# Patient Record
Sex: Male | Born: 1935 | Race: Black or African American | Hispanic: No | Marital: Married | Smoking: Former smoker
Health system: Southern US, Community
[De-identification: ages and names within clinical notes are randomized; demographics above are authoritative.]

## PROBLEM LIST (undated history)

## (undated) DIAGNOSIS — Z992 Dependence on renal dialysis: Secondary | ICD-10-CM

## (undated) DIAGNOSIS — I639 Cerebral infarction, unspecified: Secondary | ICD-10-CM

## (undated) DIAGNOSIS — N186 End stage renal disease: Secondary | ICD-10-CM

## (undated) DIAGNOSIS — A419 Sepsis, unspecified organism: Secondary | ICD-10-CM

## (undated) DIAGNOSIS — N390 Urinary tract infection, site not specified: Secondary | ICD-10-CM

## (undated) DIAGNOSIS — I10 Essential (primary) hypertension: Secondary | ICD-10-CM

## (undated) DIAGNOSIS — R55 Syncope and collapse: Secondary | ICD-10-CM

## (undated) DIAGNOSIS — M5410 Radiculopathy, site unspecified: Secondary | ICD-10-CM

## (undated) DIAGNOSIS — F0390 Unspecified dementia without behavioral disturbance: Secondary | ICD-10-CM

## (undated) DIAGNOSIS — M1990 Unspecified osteoarthritis, unspecified site: Secondary | ICD-10-CM

## (undated) DIAGNOSIS — R972 Elevated prostate specific antigen [PSA]: Secondary | ICD-10-CM

## (undated) HISTORY — PX: EYE SURGERY: SHX253

## (undated) HISTORY — DX: Dependence on renal dialysis: Z99.2

## (undated) HISTORY — DX: Radiculopathy, site unspecified: M54.10

## (undated) HISTORY — DX: Essential (primary) hypertension: I10

## (undated) HISTORY — DX: End stage renal disease: N18.6

## (undated) HISTORY — DX: Cerebral infarction, unspecified: I63.9

## (undated) HISTORY — DX: Unspecified osteoarthritis, unspecified site: M19.90

## (undated) HISTORY — DX: Elevated prostate specific antigen (PSA): R97.20

---

## 1976-08-11 HISTORY — PX: VARICOSE VEIN SURGERY: SHX832

## 1988-08-11 HISTORY — PX: EXTERNAL FIXATION WRIST FRACTURE: SHX1553

## 1996-04-21 ENCOUNTER — Encounter: Payer: Self-pay | Admitting: Family Medicine

## 1996-04-21 LAB — CONVERTED CEMR LAB: PSA: 1.5 ng/mL

## 1997-04-24 ENCOUNTER — Encounter: Payer: Self-pay | Admitting: Family Medicine

## 1997-07-17 ENCOUNTER — Encounter: Payer: Self-pay | Admitting: Family Medicine

## 1997-07-17 LAB — CONVERTED CEMR LAB: PSA: 1.6 ng/mL

## 1997-07-18 ENCOUNTER — Encounter: Payer: Self-pay | Admitting: Family Medicine

## 1997-07-18 LAB — CONVERTED CEMR LAB: Microalbumin U total vol: 335.7 mg/L

## 1998-05-16 ENCOUNTER — Encounter: Payer: Self-pay | Admitting: Family Medicine

## 1998-05-16 LAB — CONVERTED CEMR LAB: Hgb A1c MFr Bld: 7.4 %

## 1998-10-31 ENCOUNTER — Encounter: Payer: Self-pay | Admitting: Family Medicine

## 1998-10-31 LAB — CONVERTED CEMR LAB: Hgb A1c MFr Bld: 5.5 %

## 1999-09-19 ENCOUNTER — Encounter: Payer: Self-pay | Admitting: Family Medicine

## 1999-09-19 LAB — CONVERTED CEMR LAB: Microalbumin U total vol: 1247.6 mg/L

## 1999-09-20 ENCOUNTER — Encounter: Payer: Self-pay | Admitting: Family Medicine

## 1999-09-20 LAB — CONVERTED CEMR LAB: PSA: 2 ng/mL

## 2000-04-16 ENCOUNTER — Encounter: Payer: Self-pay | Admitting: Family Medicine

## 2000-10-23 ENCOUNTER — Encounter: Payer: Self-pay | Admitting: Family Medicine

## 2000-10-26 ENCOUNTER — Encounter: Payer: Self-pay | Admitting: Family Medicine

## 2000-10-26 LAB — CONVERTED CEMR LAB: Microalbumin U total vol: 432.8 mg/L

## 2001-01-11 ENCOUNTER — Encounter: Payer: Self-pay | Admitting: Family Medicine

## 2001-01-11 LAB — CONVERTED CEMR LAB: PSA: 3 ng/mL

## 2001-01-12 ENCOUNTER — Encounter: Payer: Self-pay | Admitting: Family Medicine

## 2001-01-12 LAB — CONVERTED CEMR LAB: Microalbumin U total vol: 136.4 mg/L

## 2001-09-23 ENCOUNTER — Encounter: Payer: Self-pay | Admitting: Family Medicine

## 2002-03-15 ENCOUNTER — Encounter: Payer: Self-pay | Admitting: Family Medicine

## 2002-03-15 LAB — CONVERTED CEMR LAB: PSA: 2.8 ng/mL

## 2002-09-21 ENCOUNTER — Encounter: Payer: Self-pay | Admitting: Family Medicine

## 2002-09-21 LAB — CONVERTED CEMR LAB
Hgb A1c MFr Bld: 5.7 %
Microalbumin U total vol: 112.1 mg/L

## 2003-03-13 ENCOUNTER — Encounter: Payer: Self-pay | Admitting: Family Medicine

## 2003-04-24 ENCOUNTER — Encounter: Payer: Self-pay | Admitting: Surgery

## 2003-04-24 ENCOUNTER — Ambulatory Visit (HOSPITAL_COMMUNITY): Admission: RE | Admit: 2003-04-24 | Discharge: 2003-04-24 | Payer: Self-pay | Admitting: Surgery

## 2003-05-31 ENCOUNTER — Ambulatory Visit (HOSPITAL_COMMUNITY): Admission: RE | Admit: 2003-05-31 | Discharge: 2003-05-31 | Payer: Self-pay | Admitting: Surgery

## 2003-05-31 ENCOUNTER — Encounter: Payer: Self-pay | Admitting: Surgery

## 2003-08-03 ENCOUNTER — Encounter (INDEPENDENT_AMBULATORY_CARE_PROVIDER_SITE_OTHER): Payer: Self-pay | Admitting: *Deleted

## 2003-08-03 ENCOUNTER — Inpatient Hospital Stay (HOSPITAL_COMMUNITY): Admission: RE | Admit: 2003-08-03 | Discharge: 2003-08-04 | Payer: Self-pay | Admitting: Surgery

## 2003-08-03 HISTORY — PX: THYROIDECTOMY, PARTIAL: SHX18

## 2004-07-11 HISTORY — PX: AV FISTULA PLACEMENT: SHX1204

## 2004-07-26 ENCOUNTER — Ambulatory Visit (HOSPITAL_COMMUNITY): Admission: RE | Admit: 2004-07-26 | Discharge: 2004-07-26 | Payer: Self-pay | Admitting: Vascular Surgery

## 2004-07-31 ENCOUNTER — Ambulatory Visit: Payer: Self-pay | Admitting: Family Medicine

## 2005-04-08 ENCOUNTER — Ambulatory Visit: Payer: Self-pay | Admitting: Family Medicine

## 2005-04-08 LAB — CONVERTED CEMR LAB: Hgb A1c MFr Bld: 6.1 %

## 2005-04-22 ENCOUNTER — Ambulatory Visit: Payer: Self-pay | Admitting: Family Medicine

## 2005-07-30 ENCOUNTER — Ambulatory Visit: Payer: Self-pay | Admitting: Family Medicine

## 2005-10-16 ENCOUNTER — Ambulatory Visit: Payer: Self-pay | Admitting: Family Medicine

## 2005-10-16 LAB — CONVERTED CEMR LAB: Microalbumin U total vol: 599.4 mg/L

## 2005-10-22 ENCOUNTER — Ambulatory Visit: Payer: Self-pay | Admitting: Family Medicine

## 2005-11-12 ENCOUNTER — Ambulatory Visit: Payer: Self-pay | Admitting: Family Medicine

## 2006-02-26 ENCOUNTER — Ambulatory Visit: Payer: Self-pay | Admitting: Family Medicine

## 2006-04-20 ENCOUNTER — Ambulatory Visit: Payer: Self-pay | Admitting: Family Medicine

## 2006-04-27 ENCOUNTER — Ambulatory Visit: Payer: Self-pay | Admitting: Family Medicine

## 2006-04-29 ENCOUNTER — Ambulatory Visit: Payer: Self-pay | Admitting: Family Medicine

## 2006-05-14 DIAGNOSIS — M5410 Radiculopathy, site unspecified: Secondary | ICD-10-CM

## 2006-05-14 HISTORY — DX: Radiculopathy, site unspecified: M54.10

## 2006-05-25 ENCOUNTER — Encounter: Admission: RE | Admit: 2006-05-25 | Discharge: 2006-06-18 | Payer: Self-pay | Admitting: *Deleted

## 2006-05-26 ENCOUNTER — Encounter: Payer: Self-pay | Admitting: Cardiology

## 2006-05-26 ENCOUNTER — Ambulatory Visit: Payer: Self-pay

## 2006-10-19 ENCOUNTER — Ambulatory Visit: Payer: Self-pay | Admitting: Family Medicine

## 2006-10-19 LAB — CONVERTED CEMR LAB
ALT: 11 units/L (ref 0–40)
AST: 13 units/L (ref 0–37)
BUN: 77 mg/dL — ABNORMAL HIGH (ref 6–23)
Calcium: 10.4 mg/dL (ref 8.4–10.5)
Chloride: 109 meq/L (ref 96–112)
Cholesterol: 108 mg/dL (ref 0–200)
Creatinine, Ser: 6.7 mg/dL (ref 0.4–1.5)
Glucose, Bld: 102 mg/dL — ABNORMAL HIGH (ref 70–99)
Hgb A1c MFr Bld: 6.3 %
LDL Cholesterol: 61 mg/dL (ref 0–99)
Microalb Creat Ratio: 115.5 mg/g — ABNORMAL HIGH (ref 0.0–30.0)
Microalb, Ur: 17.1 mg/dL — ABNORMAL HIGH (ref 0.0–1.9)
Sodium: 144 meq/L (ref 135–145)
TSH: 0.95 microintl units/mL (ref 0.35–5.50)
Total Bilirubin: 0.7 mg/dL (ref 0.3–1.2)
Total CHOL/HDL Ratio: 3.4
Triglycerides: 78 mg/dL (ref 0–149)
VLDL: 16 mg/dL (ref 0–40)

## 2006-11-04 ENCOUNTER — Ambulatory Visit: Payer: Self-pay | Admitting: Family Medicine

## 2006-11-23 ENCOUNTER — Ambulatory Visit: Payer: Self-pay | Admitting: Family Medicine

## 2006-11-25 ENCOUNTER — Encounter: Payer: Self-pay | Admitting: Family Medicine

## 2006-11-25 DIAGNOSIS — I10 Essential (primary) hypertension: Secondary | ICD-10-CM | POA: Insufficient documentation

## 2006-11-25 DIAGNOSIS — E1149 Type 2 diabetes mellitus with other diabetic neurological complication: Secondary | ICD-10-CM | POA: Insufficient documentation

## 2006-12-14 ENCOUNTER — Ambulatory Visit: Payer: Self-pay | Admitting: Family Medicine

## 2006-12-14 DIAGNOSIS — R972 Elevated prostate specific antigen [PSA]: Secondary | ICD-10-CM | POA: Insufficient documentation

## 2006-12-14 LAB — CONVERTED CEMR LAB
PSA, Free Pct: 30 (ref >25)
PSA, Free: 1.6 ng/mL

## 2006-12-16 ENCOUNTER — Ambulatory Visit: Payer: Self-pay | Admitting: Family Medicine

## 2006-12-16 DIAGNOSIS — E1122 Type 2 diabetes mellitus with diabetic chronic kidney disease: Secondary | ICD-10-CM

## 2006-12-16 DIAGNOSIS — N186 End stage renal disease: Secondary | ICD-10-CM

## 2006-12-29 ENCOUNTER — Ambulatory Visit: Payer: Self-pay | Admitting: Family Medicine

## 2006-12-29 LAB — CONVERTED CEMR LAB
BUN: 103 mg/dL (ref 6–23)
Calcium: 10.1 mg/dL (ref 8.4–10.5)
GFR calc Af Amer: 9 mL/min
GFR calc non Af Amer: 7 mL/min
Glucose, Bld: 102 mg/dL — ABNORMAL HIGH (ref 70–99)
Potassium: 4.1 meq/L (ref 3.5–5.1)

## 2007-01-05 ENCOUNTER — Ambulatory Visit: Payer: Self-pay | Admitting: Family Medicine

## 2007-01-25 ENCOUNTER — Encounter: Payer: Self-pay | Admitting: Family Medicine

## 2007-02-10 ENCOUNTER — Encounter (INDEPENDENT_AMBULATORY_CARE_PROVIDER_SITE_OTHER): Payer: Self-pay | Admitting: *Deleted

## 2007-02-10 ENCOUNTER — Telehealth (INDEPENDENT_AMBULATORY_CARE_PROVIDER_SITE_OTHER): Payer: Self-pay | Admitting: *Deleted

## 2007-02-10 ENCOUNTER — Ambulatory Visit: Payer: Self-pay | Admitting: Family Medicine

## 2007-02-10 LAB — CONVERTED CEMR LAB
BUN: 92 mg/dL (ref 6–23)
CO2: 20 meq/L (ref 19–32)
Calcium: 9.8 mg/dL (ref 8.4–10.5)
Chloride: 114 meq/L — ABNORMAL HIGH (ref 96–112)
Creatinine, Ser: 6.6 mg/dL (ref 0.4–1.5)
GFR calc Af Amer: 11 mL/min
Glucose, Bld: 107 mg/dL — ABNORMAL HIGH (ref 70–99)
Potassium: 4.4 meq/L (ref 3.5–5.1)

## 2007-02-16 ENCOUNTER — Ambulatory Visit: Payer: Self-pay | Admitting: Family Medicine

## 2007-03-26 ENCOUNTER — Encounter (HOSPITAL_COMMUNITY): Admission: RE | Admit: 2007-03-26 | Discharge: 2007-06-24 | Payer: Self-pay | Admitting: Nephrology

## 2007-04-16 ENCOUNTER — Ambulatory Visit: Payer: Self-pay | Admitting: Family Medicine

## 2007-04-17 LAB — CONVERTED CEMR LAB
Creatinine, Ser: 7.2 mg/dL (ref 0.4–1.5)
GFR calc non Af Amer: 8 mL/min
Potassium: 5.3 meq/L — ABNORMAL HIGH (ref 3.5–5.1)

## 2007-04-21 ENCOUNTER — Ambulatory Visit: Payer: Self-pay | Admitting: Family Medicine

## 2007-05-27 ENCOUNTER — Telehealth (INDEPENDENT_AMBULATORY_CARE_PROVIDER_SITE_OTHER): Payer: Self-pay | Admitting: *Deleted

## 2007-05-31 ENCOUNTER — Telehealth (INDEPENDENT_AMBULATORY_CARE_PROVIDER_SITE_OTHER): Payer: Self-pay | Admitting: *Deleted

## 2007-06-28 ENCOUNTER — Ambulatory Visit: Payer: Self-pay | Admitting: Family Medicine

## 2007-07-01 ENCOUNTER — Encounter (HOSPITAL_COMMUNITY): Admission: RE | Admit: 2007-07-01 | Discharge: 2007-09-29 | Payer: Self-pay | Admitting: Nephrology

## 2007-07-15 ENCOUNTER — Ambulatory Visit: Payer: Self-pay | Admitting: Family Medicine

## 2007-07-20 ENCOUNTER — Ambulatory Visit: Payer: Self-pay | Admitting: Family Medicine

## 2007-08-12 DIAGNOSIS — Z992 Dependence on renal dialysis: Secondary | ICD-10-CM

## 2007-08-12 DIAGNOSIS — N186 End stage renal disease: Secondary | ICD-10-CM

## 2007-08-12 HISTORY — DX: End stage renal disease: N18.6

## 2007-08-12 HISTORY — DX: Dependence on renal dialysis: Z99.2

## 2007-08-18 ENCOUNTER — Telehealth: Payer: Self-pay | Admitting: Family Medicine

## 2007-08-23 ENCOUNTER — Ambulatory Visit: Payer: Self-pay | Admitting: Family Medicine

## 2007-08-23 LAB — CONVERTED CEMR LAB
AST: 13 units/L (ref 0–37)
HDL: 31.2 mg/dL — ABNORMAL LOW (ref >39.0)

## 2007-08-26 ENCOUNTER — Ambulatory Visit: Payer: Self-pay | Admitting: Family Medicine

## 2007-10-07 ENCOUNTER — Encounter: Payer: Self-pay | Admitting: Family Medicine

## 2007-10-13 DIAGNOSIS — Z992 Dependence on renal dialysis: Secondary | ICD-10-CM

## 2007-10-13 HISTORY — DX: Dependence on renal dialysis: Z99.2

## 2007-10-20 ENCOUNTER — Encounter: Payer: Self-pay | Admitting: Family Medicine

## 2007-11-05 ENCOUNTER — Telehealth (INDEPENDENT_AMBULATORY_CARE_PROVIDER_SITE_OTHER): Payer: Self-pay | Admitting: Internal Medicine

## 2007-11-05 ENCOUNTER — Ambulatory Visit: Payer: Self-pay | Admitting: Family Medicine

## 2007-11-06 LAB — CONVERTED CEMR LAB
ALT: 14 units/L (ref 0–53)
AST: 19 units/L (ref 0–37)
Basophils Absolute: 0.1 10*3/uL (ref 0.0–0.1)
Bilirubin, Direct: 0.1 mg/dL (ref 0.0–0.3)
Chloride: 104 meq/L (ref 96–112)
Cholesterol: 145 mg/dL (ref 0–200)
Eosinophils Absolute: 0.7 10*3/uL (ref 0.0–0.7)
GFR calc Af Amer: 10 mL/min
Hemoglobin: 11 g/dL — ABNORMAL LOW (ref 13.0–17.0)
Hgb A1c MFr Bld: 6.1 % — ABNORMAL HIGH (ref 4.6–6.0)
LDL Cholesterol: 94 mg/dL (ref 0–99)
MCHC: 31.5 g/dL (ref 30.0–36.0)
Neutrophils Relative %: 61.7 % (ref 43.0–77.0)
Platelets: 383 10*3/uL (ref 150–400)
Potassium: 3.8 meq/L (ref 3.5–5.1)
RBC: 3.96 M/uL — ABNORMAL LOW (ref 4.22–5.81)
Sodium: 144 meq/L (ref 135–145)
Total CHOL/HDL Ratio: 4.8
Total Protein: 7.8 g/dL (ref 6.0–8.3)
VLDL: 21 mg/dL (ref 0–40)

## 2007-11-09 ENCOUNTER — Ambulatory Visit: Payer: Self-pay | Admitting: Family Medicine

## 2007-11-09 LAB — CONVERTED CEMR LAB
Creatinine,U: 86.5 mg/dL
Microalb Creat Ratio: 239.3 mg/g — ABNORMAL HIGH
Microalb, Ur: 20.7 mg/dL — ABNORMAL HIGH

## 2007-12-20 ENCOUNTER — Ambulatory Visit: Payer: Self-pay | Admitting: Family Medicine

## 2007-12-20 LAB — CONVERTED CEMR LAB
OCCULT 2: NEGATIVE
OCCULT 3: NEGATIVE

## 2007-12-22 ENCOUNTER — Encounter (INDEPENDENT_AMBULATORY_CARE_PROVIDER_SITE_OTHER): Payer: Self-pay | Admitting: *Deleted

## 2008-02-29 ENCOUNTER — Ambulatory Visit: Payer: Self-pay | Admitting: Family Medicine

## 2008-04-04 ENCOUNTER — Ambulatory Visit (HOSPITAL_COMMUNITY): Admission: RE | Admit: 2008-04-04 | Discharge: 2008-04-04 | Payer: Self-pay | Admitting: Nephrology

## 2008-05-03 ENCOUNTER — Encounter: Payer: Self-pay | Admitting: Family Medicine

## 2008-05-16 ENCOUNTER — Ambulatory Visit: Payer: Self-pay | Admitting: Family Medicine

## 2008-07-13 ENCOUNTER — Encounter: Payer: Self-pay | Admitting: Family Medicine

## 2008-11-01 ENCOUNTER — Encounter: Payer: Self-pay | Admitting: Family Medicine

## 2008-11-14 ENCOUNTER — Ambulatory Visit: Payer: Self-pay | Admitting: Family Medicine

## 2008-11-14 LAB — CONVERTED CEMR LAB
AST: 17 units/L (ref 0–37)
Alkaline Phosphatase: 77 units/L (ref 39–117)
Basophils Absolute: 0 10*3/uL (ref 0.0–0.1)
Basophils Relative: 0.3 % (ref 0.0–3.0)
Bilirubin, Direct: 0 mg/dL (ref 0.0–0.3)
CO2: 28 meq/L (ref 19–32)
Creatinine, Ser: 7.2 mg/dL (ref 0.4–1.5)
HCT: 38.6 % — ABNORMAL LOW (ref 39.0–52.0)
HDL: 31 mg/dL — ABNORMAL LOW (ref >39.00)
LDL Cholesterol: 88 mg/dL (ref 0–99)
Lymphocytes Relative: 31.3 % (ref 12.0–46.0)
Lymphs Abs: 1.8 10*3/uL (ref 0.7–4.0)
MCV: 88.3 fL (ref 78.0–100.0)
Microalb, Ur: 20.4 mg/dL — ABNORMAL HIGH (ref 0.0–1.9)
Neutrophils Relative %: 52.2 % (ref 43.0–77.0)
Phosphorus: 5.6 mg/dL — ABNORMAL HIGH (ref 2.3–4.6)
Platelets: 237 10*3/uL (ref 150.0–400.0)
Potassium: 4.2 meq/L (ref 3.5–5.1)
Sodium: 142 meq/L (ref 135–145)
TSH: 1.08 microintl units/mL (ref 0.35–5.50)
Total Bilirubin: 0.8 mg/dL (ref 0.3–1.2)
Total CHOL/HDL Ratio: 4
Total Protein: 7.7 g/dL (ref 6.0–8.3)
Triglycerides: 94 mg/dL (ref 0.0–149.0)
VLDL: 18.8 mg/dL (ref 0.0–40.0)

## 2008-11-16 ENCOUNTER — Ambulatory Visit: Payer: Self-pay | Admitting: Family Medicine

## 2008-12-12 ENCOUNTER — Ambulatory Visit: Payer: Self-pay | Admitting: Family Medicine

## 2008-12-12 LAB — CONVERTED CEMR LAB: OCCULT 2: NEGATIVE

## 2008-12-13 ENCOUNTER — Encounter (INDEPENDENT_AMBULATORY_CARE_PROVIDER_SITE_OTHER): Payer: Self-pay | Admitting: *Deleted

## 2009-01-11 ENCOUNTER — Telehealth: Payer: Self-pay | Admitting: Family Medicine

## 2009-04-18 ENCOUNTER — Encounter: Payer: Self-pay | Admitting: Family Medicine

## 2009-05-02 ENCOUNTER — Encounter: Payer: Self-pay | Admitting: Family Medicine

## 2009-05-24 ENCOUNTER — Ambulatory Visit: Payer: Self-pay | Admitting: Family Medicine

## 2009-05-24 LAB — CONVERTED CEMR LAB: Hgb A1c MFr Bld: 5.8 % (ref 4.6–6.5)

## 2009-05-31 ENCOUNTER — Ambulatory Visit: Payer: Self-pay | Admitting: Family Medicine

## 2009-06-07 ENCOUNTER — Ambulatory Visit (HOSPITAL_COMMUNITY): Admission: RE | Admit: 2009-06-07 | Discharge: 2009-06-07 | Payer: Self-pay | Admitting: Nephrology

## 2009-07-19 ENCOUNTER — Encounter: Payer: Self-pay | Admitting: Family Medicine

## 2009-10-31 ENCOUNTER — Encounter: Payer: Self-pay | Admitting: Family Medicine

## 2009-11-13 ENCOUNTER — Ambulatory Visit: Payer: Self-pay | Admitting: Family Medicine

## 2009-11-13 ENCOUNTER — Telehealth: Payer: Self-pay | Admitting: Family Medicine

## 2009-11-13 LAB — CONVERTED CEMR LAB
ALT: 14 units/L (ref 0–53)
AST: 17 units/L (ref 0–37)
Alkaline Phosphatase: 70 units/L (ref 39–117)
BUN: 14 mg/dL (ref 6–23)
Basophils Absolute: 0 10*3/uL (ref 0.0–0.1)
Basophils Relative: 0.4 % (ref 0.0–3.0)
Bilirubin, Direct: 0.1 mg/dL (ref 0.0–0.3)
Creatinine,U: 107.4 mg/dL
HDL: 34.8 mg/dL — ABNORMAL LOW (ref >39.00)
Hemoglobin: 11.5 g/dL — ABNORMAL LOW (ref 13.0–17.0)
LDL Cholesterol: 81 mg/dL (ref 0–99)
MCHC: 33.7 g/dL (ref 30.0–36.0)
MCV: 88.3 fL (ref 78.0–100.0)
Microalb Creat Ratio: 143.4 mg/g — ABNORMAL HIGH (ref 0.0–30.0)
Neutro Abs: 3.5 10*3/uL (ref 1.4–7.7)
Phosphorus: 5.7 mg/dL — ABNORMAL HIGH (ref 2.3–4.6)
Platelets: 298 10*3/uL (ref 150.0–400.0)
Potassium: 3.5 meq/L (ref 3.5–5.1)
RBC: 3.86 M/uL — ABNORMAL LOW (ref 4.22–5.81)
RDW: 14.1 % (ref 11.5–14.6)
Sodium: 141 meq/L (ref 135–145)
TSH: 1.1 microintl units/mL (ref 0.35–5.50)
Total Bilirubin: 0.3 mg/dL (ref 0.3–1.2)
Triglycerides: 98 mg/dL (ref 0.0–149.0)
VLDL: 19.6 mg/dL (ref 0.0–40.0)
Vitamin B-12: 791 pg/mL (ref 211–911)

## 2009-11-29 ENCOUNTER — Ambulatory Visit: Payer: Self-pay | Admitting: Family Medicine

## 2009-11-29 DIAGNOSIS — E559 Vitamin D deficiency, unspecified: Secondary | ICD-10-CM | POA: Insufficient documentation

## 2010-03-13 ENCOUNTER — Encounter (INDEPENDENT_AMBULATORY_CARE_PROVIDER_SITE_OTHER): Payer: Self-pay | Admitting: *Deleted

## 2010-03-14 ENCOUNTER — Ambulatory Visit (HOSPITAL_COMMUNITY): Admission: RE | Admit: 2010-03-14 | Discharge: 2010-03-14 | Payer: Self-pay | Admitting: Nephrology

## 2010-04-03 ENCOUNTER — Encounter: Payer: Self-pay | Admitting: Family Medicine

## 2010-04-16 ENCOUNTER — Ambulatory Visit: Payer: Self-pay | Admitting: Family Medicine

## 2010-04-17 LAB — CONVERTED CEMR LAB: Hgb A1c MFr Bld: 6.1 % (ref 4.6–6.5)

## 2010-05-06 ENCOUNTER — Ambulatory Visit: Payer: Self-pay | Admitting: Family Medicine

## 2010-05-06 DIAGNOSIS — K921 Melena: Secondary | ICD-10-CM

## 2010-05-07 ENCOUNTER — Encounter (INDEPENDENT_AMBULATORY_CARE_PROVIDER_SITE_OTHER): Payer: Self-pay | Admitting: *Deleted

## 2010-05-08 ENCOUNTER — Encounter: Payer: Self-pay | Admitting: Family Medicine

## 2010-06-13 ENCOUNTER — Telehealth: Payer: Self-pay | Admitting: Family Medicine

## 2010-06-17 ENCOUNTER — Encounter: Payer: Self-pay | Admitting: Family Medicine

## 2010-06-20 ENCOUNTER — Telehealth: Payer: Self-pay | Admitting: Family Medicine

## 2010-06-20 ENCOUNTER — Ambulatory Visit: Payer: Self-pay | Admitting: Gastroenterology

## 2010-06-20 ENCOUNTER — Encounter: Payer: Self-pay | Admitting: Family Medicine

## 2010-06-20 DIAGNOSIS — I6350 Cerebral infarction due to unspecified occlusion or stenosis of unspecified cerebral artery: Secondary | ICD-10-CM | POA: Insufficient documentation

## 2010-06-20 DIAGNOSIS — N186 End stage renal disease: Secondary | ICD-10-CM

## 2010-06-21 ENCOUNTER — Telehealth (INDEPENDENT_AMBULATORY_CARE_PROVIDER_SITE_OTHER): Payer: Self-pay | Admitting: *Deleted

## 2010-06-25 ENCOUNTER — Encounter: Payer: Self-pay | Admitting: Nurse Practitioner

## 2010-06-25 ENCOUNTER — Encounter: Payer: Self-pay | Admitting: Internal Medicine

## 2010-06-27 ENCOUNTER — Ambulatory Visit: Payer: Self-pay | Admitting: Internal Medicine

## 2010-06-27 LAB — HM COLONOSCOPY

## 2010-07-01 ENCOUNTER — Encounter: Payer: Self-pay | Admitting: Internal Medicine

## 2010-09-10 NOTE — Progress Notes (Signed)
Summary: Diabetic form   Phone Note Other Incoming   Caller: Sydell Axon, LPN Summary of Call: Received a form from [employer] to be completed regarding diabetic supplies. Called patient to verify that he had requested this from the company. Left message on machine for patient to call back. Sydell Axon LPN  June 13, 2010 9:39 AM    Follow-up for Phone Call        Left message on voicemail  to return call.  Lugene Fuquay CMA Duncan Dull)  June 14, 2010 2:35 PM   I cannot get Mr. Saladin on the phone (after numerous attempts) to verify this so my suggestion is to go ahead and fill it out.  Lugene Fuquay CMA Duncan Dull)  June 17, 2010 10:43 AM   Additional Follow-up for Phone Call Additional follow up Details #1::        done, in my out box.  Additional Follow-up by: Crawford Givens MD,  June 17, 2010 1:48 PM    Additional Follow-up for Phone Call Additional follow up Details #2::    Faxed and scanned. Follow-up by: Delilah Shan CMA Duncan Dull),  June 17, 2010 2:47 PM

## 2010-09-10 NOTE — Medication Information (Signed)
Summary: Diabetes Supplies/[facility]  Diabetes Supplies/[facility]   Imported By: Lanelle Bal 06/24/2010 11:48:07  _____________________________________________________________________  External Attachment:    Type:   Image     Comment:   External Document

## 2010-09-10 NOTE — Letter (Signed)
Summary: Patient Notice- Polyp Results  [facility]  [address] [place], [place] [address]   Phone: [phone]  Fax: [phone]        July 01, 2010 MRN: [id number]    [name] [address] [place], [place]  [address]    Dear [name],  I am pleased to inform you that the colon polyp(s) removed during your recent colonoscopy was (were) found to be benign (no cancer detected) upon pathologic examination.   Should you develop new or worsening symptoms of abdominal pain, bowel habit changes or bleeding from the rectum or bowels, please schedule an evaluation with either your primary care physician or with me.  Additional information/recommendations:  __ No further action with gastroenterology is needed at this time. Please      follow-up with your primary care physician for your other healthcare      needs.   Please call us if you are having persistent problems or have questions about your condition that have not been fully answered at this time.  Sincerely,  Hilarie Fredrickson MD  This letter has been electronically signed by your physician.  Appended Document: Patient Notice- Polyp Results Letter mailed

## 2010-09-10 NOTE — Letter (Signed)
Summary: [facility]: Immunoassay Fecal Occult Blood (iFOB) Order Form  [facility]  [address] [place], [place] [address]   Phone: [phone]  Fax: [phone]      [facility]: Immunoassay Fecal Occult Blood (iFOB) Order Form   April 16, 2010 MRN: [id number]   [name] [birth year]   Physicican Name:_____duncan____________________  Diagnosis Code:______v76.49____________________      Crawford Givens MD

## 2010-09-10 NOTE — Assessment & Plan Note (Signed)
Summary: 6 MONTH FOLLOW UP/[facility]   Vital Signs:  Patient profile:   77 year old male Height:      74 inches Weight:      228 pounds BMI:     29.38 Temp:     97.7 degrees F oral Pulse rate:   96 / minute Pulse rhythm:   regular BP sitting:   122 / 64  (left arm) Cuff size:   large  Vitals Entered By: Sydell Axon LPN (November 29, 2009 8:02 AM) CC: 6 month follow-up   History of Present Illness: Pt here for 6 month recheck. He feels well but gets cramps on dialysis machine.   Preventive Screening-Counseling & Management  Alcohol-Tobacco     Alcohol drinks/day: 0     Smoking Status: quit     Pack years: 2     Passive Smoke Exposure: no  Caffeine-Diet-Exercise     Caffeine use/day: 0     Does Patient Exercise: no  Problems Prior to Update: 1)  Breast Hypertrophy, Bilat L>r  (ICD-611.1) 2)  Special Screening Malig Neoplasms Other Sites  (ICD-V76.49) 3)  Aodm  (ICD-250.00) 4)  Swelling, Limb With Brawny Changes  (ICD-729.81) 5)  Prostate Specific Antigen, Elevated  (ICD-790.93) 6)  Syndrome, Carpal Tunnel w/ Compr of C5 Nerve Root-tx Decline  (ICD-354.0) 7)  Hypercholesterolemia, 146/hdl 28.8/ldl 102  (ICD-272.0) 8)  Hyperparathyroidism Nos/tertiary,thyroid Nod. , Benign  (ICD-252.08) 9)  Renal Insufficiency (DR. POWELL) (33/2.8)  (ICD-588.9) 10)  Diabetic Peripheral Neuropathy  (ICD-250.60) 11)  Hypertension  (ICD-401.9)  Medications Prior to Update: 1)  Aggrenox 25-200 Mg Cp12 (Aspirin-Dipyridamole) .[name] Kitchen.. 1 Capsuletwice A Day By Mouth 2)  Fosrenol 1000 Mg Chew (Lanthanum Carbonate) .... Chew 1 With Each Meal 3)  Sensipar 30 Mg Tabs (Cinacalcet Hcl) .[name] Kitchen.. 1 Daily By Mouth 4)  Rena-Vite  Tabs (B Complex-C-Folic Acid) .[name] Kitchen.. 1 Daily By Mouth 5)  Aspirin 81 Mg Tabs (Aspirin) .... Take One By Mouth Daily  Allergies: No Known Drug Allergies  Past History:  Past Medical History: Last updated: 11/25/2006 Hypertension  Family History: Last updated: 11/16/2008 Father dec 65  Lung Ca Mother dec 89 Natural Osteopor Fx Hip bedridden Brother A 47 Brother A 60 Sister dec 69  Brain tumor Sister A 7 Forensic scientist) Sister A 71 Fleet Contras)  Social History: Last updated: 11/25/2006 Occupation: Engineer, drilling and Record Retired Married 3 children Former Smoker Alcohol use-yes Drug use-no  Risk Factors: Alcohol Use: 0 (11/29/2009) Caffeine Use: 0 (11/29/2009) Exercise: no (11/29/2009)  Risk Factors: Smoking Status: quit (11/29/2009) Passive Smoke Exposure: no (11/29/2009)  Past Surgical History: Varicose vein stripping 1978 Fx R wrist1990 Cataract, right 06/03 Right thyroid lobectomy, subtotal parathyr. sec hyperparthyroidismadenosis 08/03/03 Left arm AV fistula placement [name] via [place] [employer]) 12/05 NCV study Neg. carpal tunnel, Left C5 radiculopathy 05/14/2006 Dialysis  started 3/4//2009  Social History: Caffeine use/day:  0  Review of Systems General:  Complains of fatigue and weakness; denies chills, fever, sweats, and weight loss. Eyes:  Denies blurring, discharge, and eye pain; night vision decreased. ENT:  Denies decreased hearing, ear discharge, earache, and ringing in ears. CV:  Denies chest pain or discomfort, fainting, palpitations, shortness of breath with exertion, swelling of feet, and swelling of hands. Resp:  Denies cough, shortness of breath, and wheezing. GI:  Denies abdominal pain, bloody stools, change in bowel habits, constipation, dark tarry stools, diarrhea, indigestion, loss of appetite, nausea, vomiting, vomiting blood, and yellowish skin color. GU:  Denies discharge, dysuria, nocturia, and urinary frequency. MS:  Complains of cramps; denies joint pain, muscle aches, muscle weakness, and stiffness; with dialysis. Derm:  Denies dryness, itching, and rash. Neuro:  Denies numbness, poor balance, tingling, and tremors.  Physical Exam  General:  Well-developed,well-nourished,in no acute distress; alert,appropriate and cooperative  throughout examination, less obese. Walks slowly favoring his left knee. Head:  Normocephalic and atraumatic without obvious abnormalities. No apparent alopecia or balding. Sinuses NT. Eyes:  Conjunctiva slightly muudy in bulbar distr, otherwise clear palpebral bilaterally.  Ears:  External ear exam shows no significant lesions or deformities.  Otoscopic examination reveals clear canals, tympanic membranes are intact bilaterally without bulging, retraction, inflammation or discharge. Hearing is grossly normal bilaterally. Mild cerumen left, impacted right. Nose:  External nasal examination shows no deformity or inflammation. Nasal mucosa are pink and moist without lesions or exudates. Mouth:  Oral mucosa and oropharynx without lesions or exudates.  Teeth in mild repair. Neck:  No deformities, masses, or tenderness noted. Chest Wall:  No deformities, masses, tenderness or gynecomastia noted. Breasts:  No masses  noted. Minimal gynecomestia noted...tissue nml feeling altho slightly generous. Pt taught SBE.  Lungs:  Normal respiratory effort, chest expands symmetrically. Lungs are clear to auscultation, no crackles or wheezes. Heart:  Normal rate and regular rhythm. S1 and S2 normal without gallop, murmur, click, rub or other extra sounds. Abdomen:  Bowel sounds positive,abdomen soft and non-tender without masses, organomegaly or hernias noted.  Rectal:  No external abnormalities noted. Normal sphincter tone. No rectal masses or tenderness. G neg today. Genitalia:  Testes bilaterally descended without nodularity, tenderness or masses. No scrotal masses or lesions. No penis lesions or urethral discharge. Prostate:  Prostate gland firm and smooth, no enlargement, nodularity, tenderness, mass, asymmetry or induration. 40gms. Msk:  Stiffin most peripheral joints. Walks slowly in a shuffle with a cane.On/off exam table independently but very slowly. Pulses:  R and L carotid,radial,femoral,dorsalis pedis and  posterior tibial pulses are full and equal bilaterally Extremities:  No clubbing, cyanosis, edema, or deformity noted with normal full range of motion of all joints except significantly limited of the left knee with discomfort..   Neurologic:  No cranial nerve deficits noted. Station and gait are normal. Sensory, motor and coordinative functions appear intact. Skin:  Intact without suspicious lesions or rashes, brawny changes of LEs better, L lower leg significantly thickened but not inflamed. Cervical Nodes:  No lymphadenopathy noted Inguinal Nodes:  No significant adenopathy Psych:  Cognition and judgment appear intact. Alert and cooperative with normal attention span and concentration. No apparent delusions, illusions, hallucinations  Diabetes Management Exam:    Foot Exam (with socks and/or shoes not present):       Sensory-Pinprick/Light touch:          Left medial foot (L-4): normal          Left dorsal foot (L-5): normal          Left lateral foot (S-1): normal          Right medial foot (L-4): normal          Right dorsal foot (L-5): normal          Right lateral foot (S-1): normal       Sensory-Monofilament:          Left foot: normal          Right foot: normal       Inspection:          Left foot: abnormal  Comments: Thickened and flaky skin globally          Right foot: abnormal             Comments: Thickened and flaky skin globally.       Nails:          Left foot: thickened          Right foot: thickened   Impression & Recommendations:  Problem # 1:  BREAST HYPERTROPHY, BILAT L>R (ICD-611.1) Assessment Unchanged Stable, no further enlargement seen. Result of previous Spironolactone use.  Problem # 2:  AODM (ICD-250.00)  Great control. Cont to be careful with diet. His updated medication list for this problem includes:    Aspirin 81 Mg Tabs (Aspirin) .[name] Kitchen... Take one by mouth daily  Labs Reviewed: Creat: 6.5 (11/13/2009)   Microalbumin: 115.5  (10/19/2006)  Last Eye Exam: normal (07/19/2009) Reviewed HgBA1c results: 5.9 (11/13/2009)  5.8 (05/24/2009)  Problem # 3:  SWELLING, LIMB WITH BRAWNY CHANGES (ICD-729.81) Assessment: Improved Slightly improvede, swelling better and skin slowly improving.  Problem # 4:  PROSTATE SPECIFIC ANTIGEN, ELEVATED (ICD-790.93) Assessment: Deteriorated Has again increased. Pt not interested in aggressiver trmt so will stop getting PSAs.  Problem # 5:  HYPERCHOLESTEROLEMIA, 146/HDL 28.8/LDL 102 (ICD-272.0) Great nos except for HDL....he'll try to start exercising more. Has been considering going to the gym. This is the impetus to start. Labs Reviewed: SGOT: 17 (11/13/2009)   SGPT: 14 (11/13/2009)   HDL:34.80 (11/13/2009), 31.00 (11/14/2008)  LDL:81 (11/13/2009), 88 (11/14/2008)  Chol:135 (11/13/2009), 138 (11/14/2008)  Trig:98.0 (11/13/2009), 94.0 (11/14/2008)  Problem # 6:  RENAL INSUFFICIENCY (DR. POWELL) (33/2.8) (ICD-588.9) Assessment: Unchanged Per nephrology and dialysis team. Discussed transplant which the pt is not really in favor of.  Problem # 7:  HYPERTENSION (ICD-401.9) Assessment: Unchanged Stable. Cont curr meds. BP today: 122/64 Prior BP: 124/68 (05/31/2009)  Labs Reviewed: K+: 3.5 (11/13/2009) Creat: : 6.5 (11/13/2009)   Chol: 135 (11/13/2009)   HDL: 34.80 (11/13/2009)   LDL: 81 (11/13/2009)   TG: 98.0 (11/13/2009)  Problem # 8:  DIABETIC PERIPHERAL NEUROPATHY (ICD-250.60) Assessment: Unchanged  Needs to be checking his feet regularly. His updated medication list for this problem includes:    Aspirin 81 Mg Tabs (Aspirin) .[name] Kitchen... Take one by mouth daily  Labs Reviewed: Creat: 6.5 (11/13/2009)   Microalbumin: 115.5 (10/19/2006)  Last Eye Exam: normal (07/19/2009) Reviewed HgBA1c results: 5.9 (11/13/2009)  5.8 (05/24/2009)  Problem # 9:  VITAMIN D DEFICIENCY (ICD-268.9) Assessment: New Chweck with dialysis team and Nephrology about replacing Vit D stores.  Complete  Medication List: 1)  Aggrenox 25-200 Mg Cp12 (Aspirin-dipyridamole) .[name] Kitchen.. 1 capsuletwice a day by mouth 2)  Fosrenol 1000 Mg Chew (Lanthanum carbonate) .... Chew 1 with each meal 3)  Sensipar 30 Mg Tabs (Cinacalcet hcl) .[name] Kitchen.. 1 daily by mouth 4)  Rena-vite Tabs (B complex-c-folic acid) .[name] Kitchen.. 1 daily by mouth 5)  Aspirin 81 Mg Tabs (Aspirin) .... Take one by mouth daily 6)  Fish Oil 1000 Mg Caps (Omega-3 fatty acids) .... Take one by mouth daily  Patient Instructions: 1)  RTC as needed .  Current Allergies (reviewed today): No known allergies

## 2010-09-10 NOTE — Medication Information (Signed)
Summary: Diabetes Supplies/[facility]  Diabetes Supplies/[facility]   Imported By: Lanelle Bal 06/24/2010 11:48:56  _____________________________________________________________________  External Attachment:    Type:   Image     Comment:   External Document

## 2010-09-10 NOTE — Assessment & Plan Note (Signed)
Summary: CPX   Vital Signs:  Patient profile:   77 year old male Height:      74 inches Weight:      229.25 pounds BMI:     29.54 Temp:     98.3 degrees F oral Pulse rate:   88 / minute Pulse rhythm:   regular BP sitting:   126 / 64  (right arm) Cuff size:   large  Vitals Entered By: Delilah Shan CMA  Dull) (April 16, 2010 12:02 PM) CC: CPX.  Recent labs from dialysis clinic included.   History of Present Illness: ESRD- doing well on HD.  MWF at [facility].  On fluid restriction.  "If I hold to that, I feel okay."  No CP.  R L leg edema at baseline per patient.   Diabetes:  Using medications without difficulties:not on DM2 meds Hypoglycemic episodes: no Hyperglycemic episodes:no Feet problems:see exam Blood Sugars averaging:  ~100 at HD  eye exam within last year:  yes  H/o elevated PSA.  Prev d/w patient by Dr. Hetty Ely with the plan not to check PSAs again. I d/w patient today and told him that I could not be sure of the source of elevation. ddx did include CA, but the patient again wanted to defer any othe testing.  Occ nocturia, nonbothersome.   Allergies: No Known Drug Allergies  Past History:  Past Medical History: Hypertension ESRD- HD 2009, MWF on [place] [facility] DM2  Family History: Reviewed history from 11/16/2008 and no changes required. Father dec 65 Lung Ca Mother dec 89 Natural Osteopor Fx Hip bedridden Brother A 67 Brother A 60 Sister dec 69  Brain tumor Sister A 18 Darral Dash) Sister A 66 Fleet Contras)  Social History: Reviewed history from 11/25/2006 and no changes required. Occupation: Engineer, drilling and Record, retired, prev [employer] [phone]  3 children Former Smoker Alcohol use-no Drug use-no  Review of Systems       See HPI.  Otherwise negative.    Physical Exam  General:  GEN: nad, alert and oriented HEENT: mucous membranes moist NECK: supple w/o LA CV: rrr. PULM: ctab, no inc wob ABD: soft, +bs EXT: no edema on  R leg, 1+ with chronic changes on L leg. L arm with thrill at HD site.  Skin at that site is intact SKIN: no acute rash   Diabetes Management Exam:    Foot Exam (with socks and/or shoes not present):       Sensory-Pinprick/Light touch:          Left medial foot (L-4): diminished          Left dorsal foot (L-5): diminished          Left lateral foot (S-1): diminished          Right medial foot (L-4): diminished          Right dorsal foot (L-5): diminished          Right lateral foot (S-1): diminished       Sensory-Monofilament:          Left foot: diminished          Right foot: diminished       Sensory-other: Dec sensation on L vs R foot, but both decreased relative to hands       Inspection:          Left foot: abnormal             Comments: chronic changes w/o breakdown  Right foot: abnormal             Comments: chronic changes w/o breakdown   Impression & Recommendations:  Problem # 1:  AODM (ICD-250.00) Recent labs from HD site reviewed with patient.  No changes in meds.  Contact with A1c.  Pt agrees.  D/w patient [id number] care and monitoring diet.  He is to check his meter at home to make sure it works.  He doens't have to check at home unless he has symptoms.  His updated medication list for this problem includes:    Aspirin 81 Mg Tabs (Aspirin) .[name] Kitchen... Take one by mouth daily  Orders: TLB-A1C / Hgb A1C (Glycohemoglobin) (83036-A1C)  Problem # 2:  PROSTATE SPECIFIC ANTIGEN, ELEVATED (ICD-790.93) D/w patient as above.  No other changes.  No plan to continue checking.   Problem # 3:  RENAL INSUFFICIENCY (DR. POWELL) (33/2.8) (ICD-588.9) At baseline, no changes.  App renal service for this pleasant patient.    Complete Medication List: 1)  Aggrenox 25-200 Mg Cp12 (Aspirin-dipyridamole) .[name] Kitchen.. 1 capsuletwice a day by mouth 2)  Fosrenol 1000 Mg Chew (Lanthanum carbonate) .... Chew 1 with each meal 3)  Sensipar 30 Mg Tabs (Cinacalcet hcl) .[name] Kitchen.. 1 daily by mouth 4)   Rena-vite Tabs (B complex-c-folic acid) .[name] Kitchen.. 1 daily by mouth 5)  Aspirin 81 Mg Tabs (Aspirin) .... Take one by mouth daily 6)  Fish Oil 1000 Mg Caps (Omega-3 fatty acids) .... Take one by mouth daily  Patient Instructions: 1)  Check with your insurance to see if they will cover the shingles shot.  Let me know if you are having trouble with your sugar meter.  I would like to see you back in 6 months to check up on your sugar.  Take care.   Current Allergies (reviewed today): No known allergies

## 2010-09-10 NOTE — Assessment & Plan Note (Signed)
Summary: blood in stool...as.   History of Present Illness Visit Type: Initial Consult Primary GI MD: Sheryn Bison MD FACP FAGA Primary Provider: Crawford Givens, MD Requesting Provider: n/a Chief Complaint: Patient here for further evaluation of heme positive stool. He denies any GI symptoms at this time. History of Present Illness:   77 year old black male with ESRD on hemodialysis and history of CVA on Aggrenox. Referred here by Dr. Para March for evaluation of heme positive stool. Hemoglobin 11.4 on 05/08/10. No GI symptoms, specifically no nausea, abdominal pain, bowel change, or weight loss. No overt bleeding. Never had colonoscopy   GI Review of Systems      Denies abdominal pain, acid reflux, belching, bloating, chest pain, dysphagia with liquids, dysphagia with solids, heartburn, loss of appetite, nausea, vomiting, vomiting blood, weight loss, and  weight gain.      Reports heme positive stool.     Denies anal fissure, black tarry stools, change in bowel habit, constipation, diarrhea, diverticulosis, fecal incontinence, hemorrhoids, irritable bowel syndrome, jaundice, light color stool, liver problems, rectal bleeding, and  rectal pain. Preventive Screening-Counseling & Management  Caffeine-Diet-Exercise     Does Patient Exercise: yes    Current Medications (verified): 1)  Aggrenox 25-200 Mg Cp12 (Aspirin-Dipyridamole) .[name] Kitchen.. 1 Capsuletwice A Day By Mouth 2)  Fosrenol 1000 Mg Chew (Lanthanum Carbonate) .... Chew 1 With Each Meal 3)  Sensipar 30 Mg Tabs (Cinacalcet Hcl) .[name] Kitchen.. 1 Daily By Mouth 4)  Rena-Vite  Tabs (B Complex-C-Folic Acid) .[name] Kitchen.. 1 Daily By Mouth 5)  Aspirin 81 Mg Tabs (Aspirin) .... Take One By Mouth Daily 6)  Fish Oil 1000 Mg Caps (Omega-3 Fatty Acids) .... Take One By Mouth Daily  Allergies (verified): No Known Drug Allergies  Past History:  Past Medical History: Reviewed history from 04/16/2010 and no changes required. Hypertension ESRD- HD 2009, MWF on [place]  [facility] DM2  Past Surgical History: Reviewed history from 11/29/2009 and no changes required. Varicose vein stripping 1978 Fx R wrist1990 Cataract, right 06/03 Right thyroid lobectomy, subtotal parathyr. sec hyperparthyroidismadenosis 08/03/03 Left arm AV fistula placement [name] via [place]) 12/05 NCV study Neg. carpal tunnel, Left C5 radiculopathy 05/14/2006 Dialysis  started 3/4//2009  Family History: Father dec 65 Lung Ca Mother dec 89 Natural Osteopor Fx Hip bedridden Brother A 74 Brother A 60 Sister dec 69  Brain tumor Sister A 20 Forensic scientist) Sister A 44 Fleet Contras) Family History of Stomach Cancer:Father???  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 now) Alcohol use-no Drug use-no Patient gets regular exercise. Does Patient Exercise:  yes  Review of Systems       The patient complains of arthritis/joint pain, fatigue, muscle pains/cramps, shortness of breath, swelling of feet/legs, and urine leakage.  The patient denies allergy/sinus, anemia, anxiety-new, back pain, blood in urine, breast changes/lumps, change in vision, confusion, cough, coughing up blood, depression-new, fainting, fever, headaches-new, hearing problems, heart murmur, heart rhythm changes, itching, menstrual pain, night sweats, nosebleeds, pregnancy symptoms, skin rash, sleeping problems, sore throat, swollen lymph glands, thirst - excessive , urination - excessive , urination changes/pain, vision changes, and voice change.    Vital Signs:  Patient profile:   77 year old male Height:      74 inches Weight:      221 pounds BMI:     28.48 BSA:     2.27 Pulse rate:   92 / minute Pulse rhythm:  regular BP sitting:   122 / 68  (right arm)  Vitals Entered By: Lamona Curl CMA Duncan Dull) (June 20, 2010 8:24 AM)  Physical Exam  General:  Well developed, well  nourished, no acute distress. Head:  Normocephalic and atraumatic. Eyes:  Conjunctiva pink, no icterus.  Mouth:  No oral lesions. Tongue moist.  Neck:  no obvious masses  Lungs:  Clear throughout to auscultation. Heart:  Regular rate and rhythm; no murmurs, rubs,  or bruits. Abdomen:  Abdomen soft, nontender, nondistended. No obvious masses or hepatomegaly.Normal bowel sounds.  Rectal:  No external or internal lesion. Stool goldish brown, heme negative Msk:  Symmetrical with no gross deformities. Normal posture. Extremities:  LLE with brown discoloration and swelling Neurologic:  Alert and  oriented x4;  grossly normal neurologically. Skin:  Intact without significant lesions or rashes. Cervical Nodes:  No significant cervical adenopathy. Psych:  Alert and cooperative. Normal mood and affect.   Impression & Recommendations:  Problem # 1:  BLOOD IN STOOL (ICD-30.37) 77 year old black male with hemoccult positive stool. Patient has never had colon cancer screeing. For evaluation the patient will be scheduled for a colonoscopy with biopsies/polypectomy (if indicated).  The risks and benefits of the procedure, as well as alternatives were discussed with the patient and his daughter. Patient agrees to proceed. There is a conflict in Dr. Norval Gable endoscopy schedule and patient's dialysis schedule.  Dr. Marina Goodell has therefore offered to do the procedure.     ,   Orders: Colonoscopy (Colon)  Problem # 2:  END STAGE RENAL DISEASE (ICD-585.6) Assessment: Comment Only On hemodialysis  Problem # 3:  CVA (ICD-434.91) Assessment: Comment Only On Aggrenox  Problem # 4:  AODM (ICD-250.00) Assessment: Comment Only  Patient Instructions: 1)  We schedled the colonoscopy with Dr. Marina Goodell  on 06-27-10 at [facility] in the [facility]. 2)  Please come to our office on the 3rd floor on Tues 06-25-10 and Pam will explain the procedure and have you sign the paperwork .  3)  Copy Sent [id number] Para March, MD

## 2010-09-10 NOTE — Progress Notes (Signed)
  Phone Note Outgoing Call   Summary of Call: Diabetic form is signed and in my outbox.  Please tell pt it is okay to check sugar once daily.  He doesn't need to check mult times a day unless he is having symptoms of low sugar.   Initial call taken by: Crawford Givens MD,  June 20, 2010 11:13 AM  Follow-up for Phone Call        Faxed.  Patient Advised.  Follow-up by: Delilah Shan CMA Nikoletta Varma Dull),  June 20, 2010 11:29 AM

## 2010-09-10 NOTE — Miscellaneous (Signed)
Summary: RX MOVIPREP  Clinical Lists Changes  Medications: Added new medication of MOVIPREP 100 GM  SOLR (PEG-KCL-NACL-NASULF-NA ASC-C) As per prep instructions. - Signed Rx of MOVIPREP 100 GM  SOLR (PEG-KCL-NACL-NASULF-NA ASC-C) As per prep instructions.;  #1 x 0;  Signed;  Entered by: Lowry Ram NCMA;  Authorized by: Willette Cluster NP;  Method used: Electronically to [facility] [address] [place], [place]  [address], Ph: [phone], Fax: [phone]    Prescriptions: MOVIPREP 100 GM  SOLR (PEG-KCL-NACL-NASULF-NA ASC-C) As per prep instructions.  #1 x 0   Entered by:   Lowry Ram NCMA   Authorized by:   Willette Cluster NP   Signed by:   Lowry Ram NCMA on 06/25/2010   Method used:   Electronically to        [facility]* (retail)       [address]       [place], [place]  [address]       Ph: [phone]       Fax: [phone]   RxID:   [id number]

## 2010-09-10 NOTE — Medication Information (Signed)
Summary: [facility]   Imported By: Lester Big Coppitt Key 07/01/2010 12:36:23  _____________________________________________________________________  External Attachment:    Type:   Image     Comment:   External Document

## 2010-09-10 NOTE — Progress Notes (Signed)
Summary: Critical lab  Phone Note From Other Clinic Call back at [phone]   Caller: [facility] Call For: Dr. Hetty Ely Summary of Call: Critical lab; Creatinine  6.5 Initial call taken by: Sydell Axon LPN,  November 13, 2009 2:42 PM  Follow-up for Phone Call        Noted. Actually better than last year. Pt sees Dr Lowell Guitar. Follow-up by: Shaune Leeks MD,  November 13, 2009 3:27 PM

## 2010-09-10 NOTE — Procedures (Signed)
Summary: Colonoscopy  Patient: [name] Note: All result statuses are Final unless otherwise noted.  Tests: (1) Colonoscopy (COL)   COL Colonoscopy           DONE     [facility]     [address]     [place], [place]  [address]           COLONOSCOPY PROCEDURE REPORT           PATIENT:  [name], [name]  MR#:  [id number]     BIRTHDATE:  [birth year], 79 yrs. old  GENDER:  male     ENDOSCOPIST:  Wilhemina Bonito. Eda Keys, MD     REF. BY:  Crawford Givens, M.D.     PROCEDURE DATE:  06/27/2010     PROCEDURE:  Colonoscopy with snare polypectomy x 1     ASA CLASS:  Class III     INDICATIONS:  heme positive stool     MEDICATIONS:   Fentanyl 75 mcg IV, Versed 8 mg IV           DESCRIPTION OF PROCEDURE:   After the risks benefits and     alternatives of the procedure were thoroughly explained, informed     consent was obtained.  Digital rectal exam was performed and     revealed no abnormalities.   The LB 180AL [id number] endoscope was     introduced through the anus and advanced to the cecum, which was     identified by both the appendix and ileocecal valve, without     limitations.Time to cecum = 4:24 min. The quality of the prep was     excellent, using MoviPrep.  The instrument was then slowly     withdrawn (time = 9:20 min) as the colon was fully examined.     <<PROCEDUREIMAGES>>           FINDINGS:  A diminutive polyp was found in the cecum. Polyp was     snared without cautery. Retrieval was successful. snare polyp     Moderate diverticulosis was found found scattered throught the     colon.   Retroflexed views in the rectum revealed internal     hemorrhoids.    The scope was then withdrawn from the patient and     the procedure completed.           COMPLICATIONS:  None     ENDOSCOPIC IMPRESSION:     1) Diminutive polyp in the cecum - removed     2) Moderate diverticulosis found scattered throught the colon     3) Internal hemorrhoids           RECOMMENDATIONS:     1)  Return to the care of your primary provider. GI follow up as     needed           ______________________________     Wilhemina Bonito. Eda Keys, MD           CC:  Crawford Givens, MD; Christen Butter; The Patient           n.     eSIGNED:   Wilhemina Bonito. Eda Keys at 06/27/2010 03:16 PM           Suzanne Boron, [id number]  Note: An exclamation mark (!) indicates a result that was not dispersed into the flowsheet. Document Creation Date: 06/27/2010 3:16 PM _______________________________________________________________________  (1) Order result status: Final Collection or observation date-time: 06/27/2010 15:01 Requested date-time:  Receipt  date-time:  Reported date-time:  Referring Physician:   Ordering Physician: Fransico Setters [phone]) Specimen Source:  Source: Launa Grill Order Number: [id number] Lab site:

## 2010-09-10 NOTE — Progress Notes (Signed)
Summary: Colonoscopy scheduled  Phone Note Outgoing Call   Call placed by: Joselyn Glassman,  June 21, 2010 10:20 AM Call placed to: Patient Summary of Call: Called and LM for Alyas or his daughter to please call me asap about the colonoscopy scheduled for 06-27-10 and I need him to come to the office on Tues 06-25-10 for me to explain and for him to sign the paperwork. Initial call taken by: Joselyn Glassman,  June 21, 2010 10:21 AM  Follow-up for Phone Call        Called pt today at 9:00Am.  I got no answer or ans machine.  I could not leave a message.  The pt does have his dialysis appt on Mon, Wed and Fridays.  Will try later. Follow-up by: Joselyn Glassman,  June 24, 2010 9:01 AM  Additional Follow-up for Phone Call Additional follow up Details #1::        Pt called me back today and he is coming after 3:00 PM today with his daughter .  She is brining Mr Bagnell after she is done working today. I will explain the Colonoscopy today and have him sign the paperwork. Additional Follow-up by: Joselyn Glassman,  June 25, 2010 10:02 AM

## 2010-09-10 NOTE — Letter (Signed)
Summary: [facility] Instructions  [facility]  [address] [place], [place] [address]   Phone: [phone]  Fax: [phone]       [name]    09-Jan-1948    MRN: [id number]        Procedure Day /Date:06-27-10     Arrival Time:1:00 PM      Procedure Time: 2:00 PM     Location of Procedure:                    X     [facility] (4th Floor) PREPARATION FOR COLONOSCOPY WITH MOVIPREP   On Tuesday 06-25-10 do not eat nuts, seeds, popcorn, corn, beans, peas,  salads, or any raw vegetables.  Do not take any fiber supplements (e.g. Metamucil, Citrucel, and Benefiber).  THE DAY BEFORE YOUR PROCEDURE         DATE: 06-26-10  DAY: Wednesday  1.  Drink clear liquids the entire day-NO SOLID FOOD  2.  Do not drink anything colored red or purple.  Avoid juices with pulp.  No orange juice.  3.  Drink at least 64 oz. (8 glasses) of fluid/clear liquids during the day to prevent dehydration and help the prep work efficiently.  CLEAR LIQUIDS INCLUDE: Water Jello Ice Popsicles Tea (sugar ok, no milk/cream) Powdered fruit flavored drinks Coffee (sugar ok, no milk/cream) Gatorade Juice: apple, white grape, white cranberry  Lemonade Clear bullion, consomm, broth Carbonated beverages (any kind) Strained chicken noodle soup Hard Candy                             4.  In the morning, mix first dose of MoviPrep solution:    Empty 1 Pouch A and 1 Pouch B into the disposable container    Add lukewarm drinking water to the top line of the container. Mix to dissolve    Refrigerate (mixed solution should be used within 24 hrs)  5.  Begin drinking the prep at 5:00 p.m. The MoviPrep container is divided by 4 marks.   Every 15 minutes drink the solution down to the next mark (approximately 8 oz) until the full liter is complete.   6.  Follow completed prep with 16 oz of clear liquid of your choice (Nothing red or purple).  Continue to drink clear liquids until  bedtime.  7.  Before going to bed, mix second dose of MoviPrep solution:    Empty 1 Pouch A and 1 Pouch B into the disposable container    Add lukewarm drinking water to the top line of the container. Mix to dissolve    Refrigerate  THE DAY OF YOUR PROCEDURE      DATE: 06-27-10 DAY: Thursday  Beginning at 9:00 AM  (5 hours before procedure):         1. Every 15 minutes, drink the solution down to the next mark (approx 8 oz) until the full liter is complete.  2. Follow completed prep with 16 oz. of clear liquid of your choice.    3. You may drink clear liquids until 12:00 Noon (2 HOURS BEFORE PROCEDURE).   MEDICATION INSTRUCTIONS  Unless otherwise instructed, you should take regular prescription medications with a small sip of water   as early as possible the morning of your procedure.        OTHER INSTRUCTIONS  You will need a responsible adult at least 75 years of age to accompany you  and drive you home.   This person must remain in the waiting room during your procedure.  Wear loose fitting clothing that is easily removed.  Leave jewelry and other valuables at home.  However, you may wish to bring a book to read or  an iPod/MP3 player to listen to music as you wait for your procedure to start.  Remove all body piercing jewelry and leave at home.  Total time from sign-in until discharge is approximately 2-3 hours.  You should go home directly after your procedure and rest.  You can resume normal activities the  day after your procedure.  The day of your procedure you should not:   Drive   Make legal decisions   Operate machinery   Drink alcohol   Return to work  You will receive specific instructions about eating, activities and medications before you leave.    The above instructions have been reviewed and explained to me by   _______________________    I fully understand and can verbalize these instructions _____________________________ Date  _________

## 2010-09-10 NOTE — Letter (Signed)
Summary: Nadara Eaton letter  [place] [facility]  [address] [place], [place] [address]   Phone: [phone]  Fax: [phone]       03/13/2010 MRN: [id number]  [name] [address] [place], [place]  [address]  Dear [name] [place], and [place] announce the retirement of Arta Silence, M.D., from full-time practice at the [facility] office effective February 07, 2010 and his plans of returning part-time.  It is important to Dr. Hetty Ely and to our practice that you understand that [facility] has seven physicians in our office for your health care needs.  We will continue to offer the same exceptional care that you have today.    Dr. Hetty Ely has spoken to many of you about his plans for retirement and returning part-time in the fall.   We will continue to work with you through the transition to schedule appointments for you in the office and meet the high standards that [facility] is committed to.   Again, it is with great pleasure that we share the news that Dr. Hetty Ely will return to [facility] at [facility] in October of 2011 with a reduced schedule.    If you have any questions, or would like to request an appointment with one of our physicians, please call us at [phone] and press the option for Scheduling an appointment.  We take pleasure in providing you with excellent patient care and look forward to seeing you at your next office visit.  Our [facility] Physicians are:  Tillman Abide, M.D. Laurita Quint, M.D. Roxy Manns, M.D. Kerby Nora, M.D. Hannah Beat, M.D. Ruthe Mannan, M.D. We proudly welcomed Raechel Ache, M.D. and Eustaquio Boyden, M.D. to the practice in July/August 2011.  Sincerely,  [name]

## 2010-09-10 NOTE — Letter (Signed)
Summary: New Patient letter  [facility]  [address] [place], [place] [address]   Phone: [phone]  Fax: [phone]       05/07/2010 MRN: [id number]  [name] [address] [place], [place]  [address]  Dear [name],  Welcome to the Gastroenterology Division at [facility].    You are scheduled to see Dr. Jarold Motto on 06/18/2010 at 2:45PM on the 3rd floor at [facility], [address]  We ask that you try to arrive at our office 15 minutes prior to your appointment time to allow for check-in.  We would like you to complete the enclosed self-administered evaluation form prior to your visit and bring it with you on the day of your appointment.  We will review it with you.  Also, please bring a complete list of all your medications or, if you prefer, bring the medication bottles and we will list them.  Please bring your insurance card so that we may make a copy of it.  If your insurance requires a referral to see a specialist, please bring your referral form from your primary care physician.  Co-payments are due at the time of your visit and may be paid by cash, check or credit card.     Your office visit will consist of a consult with your physician (includes a physical exam), any laboratory testing he/she may order, scheduling of any necessary diagnostic testing (e.g. x-ray, ultrasound, CT-scan), and scheduling of a procedure (e.g. Endoscopy, Colonoscopy) if required.  Please allow enough time on your schedule to allow for any/all of these possibilities.    If you cannot keep your appointment, please call [phone] to cancel or reschedule prior to your appointment date.  This allows [place] the opportunity to schedule an appointment for another patient in need of care.  If you do not cancel or reschedule by 5 p.m. the business day prior to your appointment date, you will be charged a $50.00 late cancellation/no-show fee.    Thank you for choosing  [facility] for your medical needs.  We appreciate the opportunity to care for you.  Please visit [place] at our website  to learn more about our practice.                     Sincerely,                                                             The Gastroenterology Division

## 2010-10-02 ENCOUNTER — Encounter: Payer: Self-pay | Admitting: Family Medicine

## 2010-10-02 LAB — CONVERTED CEMR LAB
Hemoglobin: 11.4 g/dL
PTH: 368.5 pg/mL

## 2010-10-17 ENCOUNTER — Encounter: Payer: Self-pay | Admitting: Family Medicine

## 2010-10-17 ENCOUNTER — Other Ambulatory Visit: Payer: Self-pay | Admitting: Family Medicine

## 2010-10-17 ENCOUNTER — Ambulatory Visit (INDEPENDENT_AMBULATORY_CARE_PROVIDER_SITE_OTHER): Payer: Self-pay | Admitting: Family Medicine

## 2010-10-17 DIAGNOSIS — E119 Type 2 diabetes mellitus without complications: Secondary | ICD-10-CM

## 2010-10-17 DIAGNOSIS — K921 Melena: Secondary | ICD-10-CM

## 2010-10-17 DIAGNOSIS — N186 End stage renal disease: Secondary | ICD-10-CM

## 2010-10-17 LAB — HM DIABETES FOOT EXAM

## 2010-10-17 LAB — HEMOGLOBIN A1C: Hgb A1c MFr Bld: 6 % (ref 4.6–6.5)

## 2010-10-22 NOTE — Assessment & Plan Note (Signed)
Summary: 3 MTH F/U/RBH   Vital Signs:  Patient profile:   77 year old male Height:      74 inches Weight:      220.50 pounds BMI:     28.41 Temp:     97.5 degrees F oral Pulse rate:   92 / minute Pulse rhythm:   regular BP sitting:   110 / 64  (left arm) Cuff size:   large  Vitals Entered By: Delilah Shan CMA Duncan Dull) (October 17, 2010 8:10 AM) CC: 3 months follow up   History of Present Illness: Had follow up with GI re: blood in stool. He is off NSAIDS now- prev had taken some advil.  Colonoscopy was benign.  Hgb is stable on recent labs.    Diabetes:  Using medications without difficulties: not on meds.  Hypoglycemic episodes:no Hyperglycemic episodes:no Feet problems: occ tenderness in R 1st toe Blood Sugars averaging:  ~ 120-150 after eating, when checked at HD eye exam within last year: yes, last month.    Allergies: No Known Drug Allergies  Past History:  Past Surgical History: Last updated: 11/29/2009 Varicose vein stripping 1978 Fx R wrist1990 Cataract, right 06/03 Right thyroid lobectomy, subtotal parathyr. sec hyperparthyroidismadenosis 08/03/03 Left arm AV fistula placement [name] via [place]) 12/05 NCV study Neg. carpal tunnel, Left C5 radiculopathy 05/14/2006 Dialysis  started 3/4//2009  Social History: Last updated: 10/17/2010 Occupation: Engineer, drilling and Record, retired, prev [employer] Married 1955  3 children Former Smoker-stopped 15 years ago (occasionally has cig. now) Alcohol use-no Drug use-no Patient gets regular exercise.  Past Medical History: Hypertension ESRD- HD 2009, MWF on [place] [facility] DM2- no meds   Social History: Occupation: Engineer, drilling and Record, retired, prev [employer] Married 1955  3 children Former Smoker-stopped 15 years ago (occasionally has cig. now) Alcohol use-no Drug use-no Patient gets regular exercise.  Review of Systems       See HPI.  Otherwise negative.    Physical  Exam  General:  GEN: nad, alert and oriented HEENT: mucous membranes moist NECK: supple w/o LA CV: rrr. PULM: ctab, no inc wob ABD: soft, +bs EXT: no edema in legs. L arm with thrill at HD site.  Skin at that site is intact SKIN: no acute rash   Diabetes Management Exam:    Foot Exam (with socks and/or shoes not present):       Sensory-Pinprick/Light touch:          Left medial foot (L-4): normal          Left dorsal foot (L-5): normal          Left lateral foot (S-1): normal          Right medial foot (L-4): normal          Right dorsal foot (L-5): normal          Right lateral foot (S-1): normal       Sensory-Monofilament:          Left foot: normal          Right foot: normal       Inspection:          Left foot: normal          Right foot: normal       Nails:          Left foot: normal          Right foot: normal   Impression & Recommendations:  Problem # 1:  AODM (ICD-250.00)  Improved sensation today.  I talked to patient about keeping his nails cut straight across.  This may help.  No ingrown nails now.  Feet look good. See notes on labs.  d/w patient VW:UJWJ, modest exercise.   His updated medication list for this problem includes:    Aspirin 81 Mg Tabs (Aspirin) .[name] Kitchen... Take one by mouth daily  Orders: Specimen Handling (19147) Venipuncture (82956) TLB-A1C / Hgb A1C (Glycohemoglobin) (83036-A1C)  Problem # 2:  BLOOD IN STOOL (ICD-578.1) Had follow up with GI, Hgb stable.  Pt to notify MD of changes.   Problem # 3:  END STAGE RENAL DISEASE (ICD-585.6)  Complete Medication List: 1)  Aggrenox 25-200 Mg Cp12 (Aspirin-dipyridamole) .[name] Kitchen.. 1 capsuletwice a day by mouth 2)  Fosrenol 1000 Mg Chew (Lanthanum carbonate) .... Chew 1 with each meal 3)  Sensipar 30 Mg Tabs (Cinacalcet hcl) .[name] Kitchen.. 1 daily by mouth 4)  Rena-vite Tabs (B complex-c-folic acid) .[name] Kitchen.. 1 daily by mouth 5)  Aspirin 81 Mg Tabs (Aspirin) .... Take one by mouth daily 6)  Fish Oil 1000 Mg Caps  (Omega-3 fatty acids) .... Take one by mouth daily  Patient Instructions: 1)  You can get your results through our phone system.  Follow the instructions on the blue card. 2)  Glad to see you today.  Try to keep exercising and stay away from sweets. 3)  follow up OV in 3-4 months, OV with A1c and lipids ahead of time, fasting if possible.  250.00   Orders Added: 1)  Est. Patient Level IV [21308] 2)  Specimen Handling [99000] 3)  Venipuncture [36415] 4)  TLB-A1C / Hgb A1C (Glycohemoglobin) [83036-A1C]    Current Allergies (reviewed today): No known allergies

## 2010-10-29 NOTE — Letter (Signed)
Summary: [facility]  [facility]   Imported By: Kassie Mends 10/22/2010 09:30:02  _____________________________________________________________________  External Attachment:    Type:   Image     Comment:   External Document

## 2010-12-27 NOTE — Consult Note (Signed)
[name], SHOMAKER NO.:  [id number]   MEDICAL RECORD NO.:  [id number]                   PATIENT TYPE:  INP   LOCATION:  5702                                 FACILITY:  [facility]   PHYSICIAN:  Terrial Rhodes, M.D.             DATE OF BIRTH:  1931/06/03   DATE OF CONSULTATION:  08/03/2003  DATE OF DISCHARGE:  08/04/2003                                   CONSULTATION   RENAL CONSULTATION   REFERRING PHYSICIAN:  Velora Heckler, M.D.   REASON FOR CONSULTATION:  Chronic kidney disease and tertiary  hyperparathyroidism.   HISTORY OF PRESENT ILLNESS:  [name] is a 77 year old African-American  male with past medical history significant for chronic kidney disease,  secondary to nephrosclerosis at age 33, hypertension, diabetes mellitus, and  also tertiary hyperparathyroidism with an elevated calcium and parathyroid  hormone.  He was admitted by Dr. Gerrit Friends after he underwent a subtotal  parathyroidectomy.  The patient also underwent a thyroid lobectomy because  of a thyroid nodule.  We have been asked to help manage his medical issues  and calcium/phosphorus metabolism.  Overall, the patient has been doing well  and did well during the surgery.  He denies any fever, chills, nausea,  vomiting, or chest pain; and overall feels well.   ALLERGIES:  He has no known drug allergies.   PAST MEDICAL HISTORY:  1. Chronic kidney disease stage 4 presumably secondary to hypertensive     nephrosclerosis.  2. Longstanding hypertension for about 10 years.  3. Diabetes x10 years.  4. Anemia.  5. Intolerance to ACE inhibitors due to her hyperkalemia.  6. History of congestive heart failure.  7. Tertiary hyperparathyroidism status post subtotal parathyroidectomy.  8. Right thyroid nodule status post lobectomy.   MEDICATIONS:  1. Glynase 1.5 mg a day,  2. Cardura 4 mg a day.  3. Diltiazem SR 120 mg a day.   FAMILY HISTORY:  Noncontributory.  Mother died in her 58s  of an unknown  cause.  Father died in his 42s from lung cancer.  Four brothers and 3  sisters.  No history of kidney disease.   SOCIAL HISTORY:  He is married for 50 years, has 3 children, denies tobacco  or alcohol.   REVIEW OF SYSTEMS:  In general the patient denies any anorexia or malaise.  OPHTHALMIC:  Denies any blurred vision or photophobia.  CARDIAC:  Denies any  chest pain or palpitations.  PULMONARY:  Denies any shortness of breath,  hemoptysis or productive cough. GI:  Denies any nausea, vomiting,  hematochezia, or melena.  GU:  Denies any dysuria, pyuria, or hematuria.  RHEUMATOLOGIC:  No arthralgias or myalgias.  NEUROLOGIC:  No numbness,  tingling or weakness.  All other systems are negative.   PHYSICAL EXAMINATION:  GENERAL:  He is a well-developed, well-nourished male  in no apparent distress.  VITAL SIGNS:  Temperature 96.8, blood pressure  147/85, pulse of 57,  respiratory rate of 18.  HEENT:  Head normocephalic, atraumatic.  No icterus.  Oropharynx without  lesions.  NECK:  Neck has a scar from surgery and is bandages.  LUNGS:  Lungs were clear to auscultation and percussion bilaterally.  CARDIAC:  Bradycardic at 11, no precordium appreciated.  ABDOMEN:  Normoactive bowel sounds, soft, nontender, nondistended.  EXTREMITIES:  No clubbing, cyanosis, or edema.   LABS:  Sodium 144, potassium 4.9, chloride 117, CO2 22, BUN 41, creatinine  3.5, glucose 107.   ASSESSMENT AND PLAN:  1. Tertiary hyperparathyroidism status post subtotal parathyroidectomy.     Start the patient on calcium supplements as well as vitamin D and     continue to follow the calcium and phosphorus.  2. Hypertension.  Blood pressure is stable.  Continue with his current     medications.  3. Chronic kidney disease.  Serum creatinine is stable.  Continue to follow     up.  4. Anemia.  Will continue to follow CBC.  The patient is in good condition     and did well following surgery.  Will continue  to follow along.                                               Terrial Rhodes, M.D.    JC/MEDQ  D:  08/03/2003  T:  08/05/2003  Job:  [id number]

## 2010-12-27 NOTE — Op Note (Signed)
NAMEBLUE, WINTHER NO.:  [id number]   MEDICAL RECORD NO.:  [id number]          PATIENT TYPE:  OIB   LOCATION:  2899                         FACILITY:  [facility]   PHYSICIAN:  Balinda Quails, M.D.    DATE OF BIRTH:  [birth year]   DATE OF PROCEDURE:  07/26/2004  DATE OF DISCHARGE:  07/26/2004                                 OPERATIVE REPORT   SURGEON:  Balinda Quails, M.D.   ASSISTANT:  Rowe Clack, P.A.-C.   ANESTHESIA:  Local with MAC.   PREOPERATIVE DIAGNOSIS:  Chronic renal insufficiency.   POSTOPERATIVE DIAGNOSIS:  Chronic renal insufficiency.   PROCEDURE:  Creation of left Cimino fistula.   OPERATIVE PROCEDURE:  The patient was brought to the operating room in  stable condition and placed in the supine position.  Left arm was prepped  and draped in a sterile fashion.  The skin and subcutaneous tissues were  instilled with 1% Xylocaine with epinephrine.  A longitudinal skin incision  was made over the left cephalic vein at the anatomical snuff box.  Dissection was carried down to expose the cephalic vein.  This was 2.5-3 mm  in size.  The vein was freed up.  Small tributaries were ligated with 4-0  silk and divided.  The vein was ligated distally with clips and divided.  The vein was dilated to a 2.5 mm dilator and flushed with heparin and saline  solution, and papaverine applied.   Deep dissection was carried down to expose the radial artery.  This was  freed in a circle of vessel loops proximally and distally.  The patient was  administered 3000 units of heparin intravenously.  The radial artery  controlled proximally and distally with bulldog clamps.  A longitudinal  arteriotomy was made.  The cephalic vein was divided and anastomosed end-to-  side to the radial artery using running 7-0 Prolene suture.  Clamp was then  removed.  Excellent flow was present.  Adequate hemostasis obtained.  Sponge  and instruments counts correct.   The subcutaneous  tissues was closed with a running 2-0 Vicryl suture in a  single layer.  The skin was closed with a 4-0 Monocryl.  Steri-Strips  applied.  The patient tolerated the procedure well and transferred to the  recovery room in stable condition.       PGH/MEDQ  D:  07/26/2004  T:  07/27/2004  Job:  [id number]

## 2010-12-27 NOTE — Op Note (Signed)
NAMESAMARION, EHLE NO.:  [id number]   MEDICAL RECORD NO.:  [id number]                   PATIENT TYPE:  INP   LOCATION:  5702                                 FACILITY:  [facility]   PHYSICIAN:  Velora Heckler, M.D.                DATE OF BIRTH:  07-09-1931   DATE OF PROCEDURE:  08/03/2003  DATE OF DISCHARGE:  08/04/2003                                 OPERATIVE REPORT   PREOPERATIVE DIAGNOSIS:  Tertiary hyperparathyroidism.   POSTOPERATIVE DIAGNOSES:  1. Tertiary hyperparathyroidism.  2. A 2 cm right thyroid nodule.   PROCEDURES:  1. Subtotal parathyroidectomy (3-1/2 glands).  2. Right thyroid lobectomy.   SURGEON:  Velora Heckler, M.D.   ASSISTANT:  Abigail Miyamoto, M.D.   ANESTHESIA:  General.   ESTIMATED BLOOD LOSS:  Minimal.   PREPARATION:  Betadine.   COMPLICATIONS:  None.   INDICATIONS:  The patient is a 77 year old black male referred by Britt Bottom C.  Lowell Guitar, M.D., and Laurita Quint, M.D., for evaluation of elevated  parathyroid hormone levels.  The patient has had an extensive evaluation  including sestamibi scan, MRI scan of the neck, and numerous laboratory  studies.  The patient is felt to have tertiary hyperparathyroidism due  chronic renal insufficiency.  He now comes to surgery for neck exploration.   BODY OF REPORT:  The procedure was done in OR #16 at the [facility].  The patient was brought to the operating room and placed  in a supine position on the operating room table.  Following administration  of general anesthesia, the patient is positioned and then prepped and draped  in the usual strict aseptic fashion.  After ascertaining that an adequate  level of anesthesia had been obtained, a Kocher incision is made with a #10  blade.  Dissection is carried down through subcutaneous tissues and  platysma.  Hemostasis is obtained with the electrocautery.  Skin flaps are  developed cephalad and caudad from  the thyroid notch to the sternal notch.  A Mahorner self-retaining retractor is placed for exposure.  Dissection is  begun on the left side.  Strap muscles are reflected laterally.  The left  thyroid lobe is mobilized.  Small venous tributaries are divided between  small Ligaclips.  The gland is explored.  The left thyroid lobe appears  grossly normal.  Both the left superior and inferior parathyroid glands are  identified.  These appear grossly normal.  They are less than 1 cm in size.  They have the appearance of normal parathyroid tissue.   Next we turned our attention to the right side of the neck.  Again strap  muscles are reflected laterally.  The thyroid lobe is mobilized.  Venous  tributaries are divided between small and medium Ligaclips.  The gland is  rolled anteriorly.  Exploration reveals an enlarged parathyroid gland in the  tracheoesophageal groove  inferiorly.  This is felt to represent a right  inferior parathyroid gland.  It measures 2.0 x 1.2 x 0.7 cm in size.  Dr.  Tammi Sou performed frozen section biopsy and feels that it is likely a  parathyroid adenoma.  Further exploration of the right neck reveals a right  superior parathyroid gland adherent to the capsule of the upper pole of the  thyroid.  This is carefully dissected out.  It is also excised completely.  It is mildly enlarged at 1.4 x 1.0 x 0.4 cm.  Frozen section biopsy is  submitted and shows hyperplasia.  The remaining tissue is placed on ice  saline on the back table.   The right thyroid lobe contains a 2 cm solid nodule in the inferior pole.  Therefore, a right thyroid lobectomy is performed.  Superior pole vessels  are ligated in continuity with 2-0 silk ties and medium Ligaclips and  divided.  The gland is rolled anteriorly.  Branches of the inferior thyroid  artery are divided between small and medium Ligaclips.  The gland is rolled  further anteriorly.  Inferior venous tributaries are divided between  medium  Ligaclips.  The ligament of Allyson Sabal is transected.  Recurrent laryngeal nerve  is preserved.  The gland is rolled across the midline and dissected off of  the trachea.  The isthmus is divided between hemostats and ligated with 3-0  Vicryl suture ligatures.  The specimen is submitted to pathology.  It shows  a 2 cm solid nodule in the inferior pole consistent with hyperplastic nodule  according to Dr. Tammi Sou.  At this point we re-explored the left neck  and resected approximately two-thirds of each of the parathyroid glands in  the left neck.  This was performed by dissecting out the gland on their  vascular pedicle, placing a large yellow Ligaclip across the midbody of each  gland, and excising the distal portion of each gland.  These are submitted  as the left superior and left inferior parathyroid glands.  Dr. Tammi Sou confirms parathyroid tissue in both specimens.  Good hemostasis was  noted bilaterally.  Surgicel was placed over the area of the parathyroid  glands and the recurrent laryngeal nerve bilaterally.  The strap muscles are  then closed in the midline with interrupted 3-0 Vicryl sutures.  The  platysma is closed with interrupted 3-0 Vicryl sutures.  Skin edges are  closed with a running 4-0 Vicryl subcuticular suture.  The wound is washed  and dried, and Benzoin and Steri-Strips are applied.  Sterile gauze  dressings are applied.  The patient is awakened from anesthesia and brought  to the recovery room in stable condition.  The patient tolerated the  procedure well.                                               Velora Heckler, M.D.    TMG/MEDQ  D:  08/03/2003  T:  08/04/2003  Job:  [id number]   cc:   Mindi Slicker. Lowell Guitar, M.D.  [address]  [place]  [place] [address]  Fax: [phone]   Laurita Quint, M.D.  [address] [place]  [place] [address]  Fax: [phone]

## 2011-01-11 ENCOUNTER — Encounter: Payer: Self-pay | Admitting: Family Medicine

## 2011-01-14 ENCOUNTER — Other Ambulatory Visit (INDEPENDENT_AMBULATORY_CARE_PROVIDER_SITE_OTHER): Payer: Medicare Other | Admitting: Family Medicine

## 2011-01-14 ENCOUNTER — Ambulatory Visit: Payer: Medicare Other | Admitting: Family Medicine

## 2011-01-14 DIAGNOSIS — E119 Type 2 diabetes mellitus without complications: Secondary | ICD-10-CM

## 2011-01-14 LAB — LIPID PANEL
HDL: 37.4 mg/dL — ABNORMAL LOW (ref >39.00)
LDL Cholesterol: 72 mg/dL (ref 0–99)
Total CHOL/HDL Ratio: 4
Triglycerides: 129 mg/dL (ref 0.0–149.0)

## 2011-01-21 ENCOUNTER — Ambulatory Visit: Payer: Medicare Other | Admitting: Family Medicine

## 2011-01-23 ENCOUNTER — Ambulatory Visit (INDEPENDENT_AMBULATORY_CARE_PROVIDER_SITE_OTHER): Payer: Medicare Other | Admitting: Family Medicine

## 2011-01-23 ENCOUNTER — Encounter: Payer: Self-pay | Admitting: Family Medicine

## 2011-01-23 VITALS — BP 114/64 | HR 80 | Temp 97.5°F | Ht 74.0 in | Wt 220.4 lb

## 2011-01-23 DIAGNOSIS — N186 End stage renal disease: Secondary | ICD-10-CM

## 2011-01-23 DIAGNOSIS — I6350 Cerebral infarction due to unspecified occlusion or stenosis of unspecified cerebral artery: Secondary | ICD-10-CM

## 2011-01-23 DIAGNOSIS — I10 Essential (primary) hypertension: Secondary | ICD-10-CM

## 2011-01-23 DIAGNOSIS — E1149 Type 2 diabetes mellitus with other diabetic neurological complication: Secondary | ICD-10-CM

## 2011-01-23 DIAGNOSIS — E119 Type 2 diabetes mellitus without complications: Secondary | ICD-10-CM

## 2011-01-23 DIAGNOSIS — K921 Melena: Secondary | ICD-10-CM

## 2011-01-23 NOTE — Assessment & Plan Note (Signed)
Talked to pt about prevention of anther CVA with DM2/BP control and antiplatelet tx.  He understood.

## 2011-01-23 NOTE — Assessment & Plan Note (Signed)
Sugar controlled by A1c.  Hope to prevent progression.  No skin breakdown, d/w pt about foot care.

## 2011-01-23 NOTE — Assessment & Plan Note (Signed)
BP controlled, no change in meds.  

## 2011-01-23 NOTE — Assessment & Plan Note (Signed)
On HD, per renal.  No sig edema on exam today and HD site appears intact.

## 2011-01-23 NOTE — Patient Instructions (Addendum)
Check with your insurance to see if they will cover the shingles shot. I want to recheck your sugar test in about 6months.  Schedule a visit with me a few days after that.  Take care. I would get a flu shot each fall.   I would get a antifungal cream.  Talk to Rob about this.  He should have a generic.  Use it until your foot heals and try to keep your feet dry.

## 2011-01-23 NOTE — Progress Notes (Signed)
Diabetes:  Using medications without difficulties:no meds Hypoglycemic episodes:no Hyperglycemic episodes:no Feet problems:no Blood Sugars averaging: 100-140 at HD site eye exam within last year: has f/u pending.   Knee OA.  His knees have been bothering him, esp with the weather changes.  Not taking much for pain. We talked about tylenol prn.  He hasn't used that yet.   Continues with HD MWF and is doing well with that.  Not sob.  Compliant.  No sig ble edema.    H/o CVA.  D/w pt about secondary prevention.  See plan.   Labs d/w pt.   PMH and SH reviewed  Meds, vitals, and allergies reviewed.   ROS: See HPI.  Otherwise negative.    GEN: nad, alert and oriented HEENT: mucous membranes moist NECK: supple w/o LA CV: rrr. PULM: ctab, no inc wob ABD: soft, +bs EXT: trace edema SKIN: no acute rash, HD site with bruit noted.   Diabetic foot exam: Normal inspection except for mild superficial fungal infection between the toes on the L foot No skin breakdown No calluses  Normal DP pulses Dec sensation to light touch and monofilament Nails normal  Labs d/w pt.

## 2011-04-09 ENCOUNTER — Other Ambulatory Visit (HOSPITAL_COMMUNITY): Payer: Self-pay | Admitting: Nephrology

## 2011-04-09 DIAGNOSIS — N186 End stage renal disease: Secondary | ICD-10-CM

## 2011-04-15 ENCOUNTER — Ambulatory Visit (HOSPITAL_COMMUNITY)
Admission: RE | Admit: 2011-04-15 | Discharge: 2011-04-15 | Disposition: A | Discharge status: Home / Self Care | Payer: Medicare Other | Source: Ambulatory Visit | Attending: Nephrology | Admitting: Nephrology

## 2011-04-15 ENCOUNTER — Other Ambulatory Visit (HOSPITAL_COMMUNITY): Payer: Self-pay | Admitting: Nephrology

## 2011-04-15 DIAGNOSIS — I739 Peripheral vascular disease, unspecified: Secondary | ICD-10-CM | POA: Insufficient documentation

## 2011-04-15 DIAGNOSIS — E669 Obesity, unspecified: Secondary | ICD-10-CM | POA: Insufficient documentation

## 2011-04-15 DIAGNOSIS — T82898A Other specified complication of vascular prosthetic devices, implants and grafts, initial encounter: Secondary | ICD-10-CM | POA: Insufficient documentation

## 2011-04-15 DIAGNOSIS — Y832 Surgical operation with anastomosis, bypass or graft as the cause of abnormal reaction of the patient, or of later complication, without mention of misadventure at the time of the procedure: Secondary | ICD-10-CM | POA: Insufficient documentation

## 2011-04-15 DIAGNOSIS — N186 End stage renal disease: Secondary | ICD-10-CM

## 2011-04-15 DIAGNOSIS — I120 Hypertensive chronic kidney disease with stage 5 chronic kidney disease or end stage renal disease: Secondary | ICD-10-CM | POA: Insufficient documentation

## 2011-04-15 DIAGNOSIS — D649 Anemia, unspecified: Secondary | ICD-10-CM | POA: Insufficient documentation

## 2011-04-15 DIAGNOSIS — E119 Type 2 diabetes mellitus without complications: Secondary | ICD-10-CM | POA: Insufficient documentation

## 2011-04-15 MED ORDER — IOHEXOL 300 MG/ML  SOLN
100.0000 mL | Freq: Once | INTRAMUSCULAR | Status: AC | PRN
  Administered 2011-04-15: 55 mL via INTRAVENOUS

## 2011-04-17 ENCOUNTER — Other Ambulatory Visit: Payer: Self-pay | Admitting: Family Medicine

## 2011-04-30 LAB — CBC
HCT: 36.2 — ABNORMAL LOW
MCHC: 32.9
MCV: 84.2
Platelets: 211
RDW: 17 — ABNORMAL HIGH

## 2011-04-30 LAB — IRON AND TIBC: Saturation Ratios: 18 — ABNORMAL LOW

## 2011-05-02 LAB — IRON AND TIBC
Saturation Ratios: 21
UIBC: 214

## 2011-05-02 LAB — POCT HEMOGLOBIN-HEMACUE
Hemoglobin: 11.5 — ABNORMAL LOW
Operator id: 206361

## 2011-05-02 LAB — BASIC METABOLIC PANEL
CO2: 18 — ABNORMAL LOW
Chloride: 111
Glucose, Bld: 93
Potassium: 4.7
Sodium: 140

## 2011-05-02 LAB — FERRITIN: Ferritin: 249 (ref 22–322)

## 2011-05-19 LAB — CBC
HCT: 33.9 — ABNORMAL LOW
MCHC: 32
MCV: 87.2
Platelets: 212

## 2011-05-20 LAB — CBC
MCV: 87
Platelets: 246
RDW: 15.7 — ABNORMAL HIGH
WBC: 5.1

## 2011-05-22 LAB — IRON AND TIBC
Saturation Ratios: 23
TIBC: 260
UIBC: 199

## 2011-05-22 LAB — CBC
HCT: 34.1 — ABNORMAL LOW
Hemoglobin: 11.1 — ABNORMAL LOW
Platelets: 234
WBC: 6.3

## 2011-05-23 LAB — CBC
MCHC: 32.7
MCV: 87.5
Platelets: 228
RBC: 3.69 — ABNORMAL LOW

## 2011-08-07 ENCOUNTER — Encounter: Payer: Self-pay | Admitting: Family Medicine

## 2011-09-18 ENCOUNTER — Telehealth: Payer: Self-pay | Admitting: *Deleted

## 2011-09-18 NOTE — Telephone Encounter (Signed)
[name] says that he is on dialysis and has been taking Aggrenox twice daily.  He says that the dialysis folks wanted him to ask if he should go back to taking it once a day?

## 2011-09-19 MED ORDER — ASPIRIN-DIPYRIDAMOLE ER 25-200 MG PO CP12: 1.0000 | ORAL_CAPSULE | Freq: Every day | ORAL | Status: DC

## 2011-09-19 NOTE — Telephone Encounter (Signed)
Left detailed message on patient's answering machine with results, med list updated.

## 2011-09-19 NOTE — Telephone Encounter (Signed)
This is reasonable.  Med list changed, please notify pt.  Thanks.

## 2012-01-20 ENCOUNTER — Telehealth: Payer: Self-pay | Admitting: Family Medicine

## 2012-01-20 NOTE — Telephone Encounter (Signed)
Noted  

## 2012-01-20 NOTE — Telephone Encounter (Signed)
Caller: Dimitris/Patient; PCP: Crawford Givens Clelia Croft); CB#: [phone]; Call regarding R. Leg Pain; R leg with ambulation onset 01/17/12. No known trauma. Appt sched for 01/21/12 @ 0945 with Dr. Dayton Martes. Leg Pain Protocol.

## 2012-01-21 ENCOUNTER — Ambulatory Visit: Payer: Medicare Other | Admitting: Family Medicine

## 2012-01-21 DIAGNOSIS — Z0289 Encounter for other administrative examinations: Secondary | ICD-10-CM

## 2012-02-03 ENCOUNTER — Ambulatory Visit (INDEPENDENT_AMBULATORY_CARE_PROVIDER_SITE_OTHER): Payer: Medicare Other | Admitting: Family Medicine

## 2012-02-03 ENCOUNTER — Ambulatory Visit (INDEPENDENT_AMBULATORY_CARE_PROVIDER_SITE_OTHER)
Admission: RE | Admit: 2012-02-03 | Discharge: 2012-02-03 | Disposition: A | Discharge status: Home / Self Care | Payer: Medicare Other | Source: Ambulatory Visit | Attending: Family Medicine | Admitting: Family Medicine

## 2012-02-03 ENCOUNTER — Encounter: Payer: Self-pay | Admitting: Family Medicine

## 2012-02-03 VITALS — BP 128/60 | HR 94 | Temp 98.1°F | Wt 215.0 lb

## 2012-02-03 DIAGNOSIS — M25569 Pain in unspecified knee: Secondary | ICD-10-CM

## 2012-02-03 DIAGNOSIS — M79606 Pain in leg, unspecified: Secondary | ICD-10-CM

## 2012-02-03 DIAGNOSIS — E1122 Type 2 diabetes mellitus with diabetic chronic kidney disease: Secondary | ICD-10-CM

## 2012-02-03 DIAGNOSIS — M79609 Pain in unspecified limb: Secondary | ICD-10-CM

## 2012-02-03 DIAGNOSIS — E119 Type 2 diabetes mellitus without complications: Secondary | ICD-10-CM

## 2012-02-03 LAB — HEMOGLOBIN A1C: Hgb A1c MFr Bld: 5.4 % (ref 4.6–6.5)

## 2012-02-03 NOTE — Progress Notes (Signed)
Diet controlled DM2 with h/o ESRD.  Highest recent sugar reading is 120.  Due for A1c.  We discussed today.  See notes on labs.    R knee, thigh and shin pain.  Was episodic, worse with weather changes. No FCNAVD.  No rash.  No trauma, no trigger known.  Walking with a cane at baseline.  Worse pain walking than sitting. Pain with certain movements.    Meds, vitals, and allergies reviewed.   ROS: See HPI.  Otherwise, noncontributory.  nad ncat R thigh and hamstring not ttp R calf and shin not ttp Knee with crepitus with flex and ext but not red or puffy.  Ligamentous testing w/o laxity for ACL/LCL/MCL.  Joint line not ttp

## 2012-02-03 NOTE — Patient Instructions (Signed)
I would take tylenol (2 at a time, 3 times a day) as needed.  If not improved, ask the renal doctors about other options for pain meds.  I think you have arthritis in your knee and that is causing the pain.  Take care.  We'll contact you with your lab and xray report.

## 2012-02-04 DIAGNOSIS — M79606 Pain in leg, unspecified: Secondary | ICD-10-CM | POA: Insufficient documentation

## 2012-02-04 NOTE — Assessment & Plan Note (Signed)
See notes on labs.  No meds for DM at this point.  Continue diet.

## 2012-02-04 NOTE — Assessment & Plan Note (Signed)
No obvious source other than the knee/OA. Would use tylenol for now and then he'll d/w renal MDs about other meds if pain isn't controlled. F/u prn.  He agrees.

## 2012-03-09 ENCOUNTER — Other Ambulatory Visit: Payer: Self-pay

## 2012-03-09 DIAGNOSIS — T82898A Other specified complication of vascular prosthetic devices, implants and grafts, initial encounter: Secondary | ICD-10-CM

## 2012-03-31 ENCOUNTER — Encounter: Payer: Self-pay | Admitting: Vascular Surgery

## 2012-04-01 ENCOUNTER — Encounter (INDEPENDENT_AMBULATORY_CARE_PROVIDER_SITE_OTHER): Payer: Medicare Other | Admitting: *Deleted

## 2012-04-01 ENCOUNTER — Ambulatory Visit (INDEPENDENT_AMBULATORY_CARE_PROVIDER_SITE_OTHER): Payer: Medicare Other | Admitting: Vascular Surgery

## 2012-04-01 ENCOUNTER — Encounter: Payer: Self-pay | Admitting: Vascular Surgery

## 2012-04-01 VITALS — BP 150/69 | HR 85 | Resp 18 | Ht 76.0 in | Wt 215.0 lb

## 2012-04-01 DIAGNOSIS — N186 End stage renal disease: Secondary | ICD-10-CM

## 2012-04-01 DIAGNOSIS — T82898A Other specified complication of vascular prosthetic devices, implants and grafts, initial encounter: Secondary | ICD-10-CM

## 2012-04-01 NOTE — Progress Notes (Signed)
[facility] [place] HISTORY AND PHYSICAL   History of Present Illness:  Patient is a 78 y.o. 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 The patient states the aneurysm on his left forearm has doubled in size in the last 6 months. He has had no bleeding episodes. Currently the fistula has been cannulated several centimeters proximal to the aneurysm. He is using a buttonhole technique. He dialyzes on Monday Wednesday and Friday. Other chronic medical problems include hypertension, diabetes, arthritis. These are all currently controlled  Past Medical History  Diagnosis Date  . Hypertension   . Diabetes mellitus     typeII / No meds  . Cataract 01/2002    Right  . Dialysis patient 10/13/2007  . Radiculopathy 05/14/2006    NCV study negative carpal tunnel, Left C5 radiculopathy  . Arthritis     Knee pain  . Stroke   . ESRD (end stage renal disease) on dialysis 2009    [facility] diaylsis Monday , Wed. and Friday per Dr. Kathrene Bongo    Past Surgical History  Procedure Date  . Varicose vein surgery 1978  . External fixation wrist fracture 1990  . Thyroidectomy, partial 08/03/2003    Right thyroid lobectomy, subtotal parathyr. sec hyperparthyroidismadenosis  . Av fistula placement 07/2004    Left arm AV fistula placement [name] via Lowell Guitar)     Social History History  Substance Use Topics  . Smoking status: Former Smoker -- 10 years    Types: Cigarettes    Quit date: 04/01/2002  . Smokeless tobacco: Never Used   Comment: quit ove 20 years  . Alcohol Use: No    Family History Family History  Problem Relation Age of Onset  . Cancer Father 67    Lung Cancer//? stomach cancer  . Cancer Sister 71    brain tumor    Allergies  No Known Allergies   Current Outpatient Prescriptions  Medication Sig Dispense Refill  . aspirin 81 MG EC tablet Take 81 mg by mouth daily.        . cinacalcet (SENSIPAR) 30 MG tablet  Take 30 mg by mouth 2 (two) times daily.       [name] Kitchen dipyridamole-aspirin (AGGRENOX) 25-200 MG per 12 hr capsule Take 1 capsule by mouth daily.      [name] Kitchen lanthanum (FOSRENOL) 1000 MG chewable tablet Chew 1,000 mg by mouth 3 (three) times daily with meals.        . multivitamin (RENA-VIT) TABS tablet Take 1 tablet by mouth daily.        . Omega-3 Fatty Acids (FISH OIL) 1000 MG CAPS Take 1 capsule by mouth daily.        . ONE TOUCH ULTRA TEST test strip TEST BLOOD GLUCOSE ONE TIME DAILY  100 each  10    ROS:   General:  No weight loss, Fever, chills  HEENT: No recent headaches, no nasal bleeding, no visual changes, no sore throat  Neurologic: No dizziness, blackouts, seizures. No recent symptoms of stroke or mini- stroke. No recent episodes of slurred speech, or temporary blindness.  Cardiac: No recent episodes of chest pain/pressure, no shortness of breath at rest.  No shortness of breath with exertion.  Denies history of atrial fibrillation or irregular heartbeat  Vascular: No history of rest pain in feet.  No history of claudication.  No history of non-healing ulcer, No history of DVT   Pulmonary: No home oxygen, no productive cough, no  hemoptysis,  No asthma or wheezing  Musculoskeletal:  [ ]  Arthritis, [ ]  Low back pain,  [ ]  Joint pain  Hematologic:No history of hypercoagulable state.  No history of easy bleeding.  No history of anemia  Gastrointestinal: No hematochezia or melena,  No gastroesophageal reflux, no trouble swallowing  Urinary: [ ]  chronic Kidney disease, [x ] on HD - [ x] MWF or [ ]  TTHS, [ ]  Burning with urination, [ ]  Frequent urination, [ ]  Difficulty urinating;   Skin: No rashes  Psychological: No history of anxiety,  No history of depression   Physical Examination  Filed Vitals:   04/01/12 1543  BP: 150/69  Pulse: 85  Resp: 18  Height: 6\' 4"  (1.93 m)  Weight: 215 lb (97.523 kg)    Body mass index is 26.17 kg/(m^2).  General:  Alert and oriented, no  acute distress HEENT: Normal Neck: No bruit or JVD Pulmonary: Clear to auscultation bilaterally Cardiac: Regular Rate and Rhythm  Skin: No rash Extremity Pulses:  2+ radial, brachial pulses bilaterally, 4 x 3 cm aneurysm proximal third left radiocephalic AV fistula, palpable thrill in fistula left hand is pink and warm  DATA: The patient had a duplex ultrasound AV fistula today. There is no significant narrowing except for potentially right after the area of pseudoaneurysm. 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 Procedure is scheduled for Tuesday, 04/13/2012. Procedure details risks benefits possible complications were discussed the patient today. He understands and agrees to proceed.  Fabienne Bruns, MD [facility] [place] Office: [phone] Pager: [phone]   Fabienne Bruns, MD [facility] [place] Office: [phone] Pager: [phone]

## 2012-04-02 ENCOUNTER — Other Ambulatory Visit: Payer: Self-pay

## 2012-04-09 ENCOUNTER — Encounter (HOSPITAL_COMMUNITY): Payer: Self-pay | Admitting: *Deleted

## 2012-04-09 NOTE — Progress Notes (Addendum)
Pt does not see a cardiologist and has not had a stress test or 2 D Echo "lately".. 2 D eho oreults in [facility] from 2007.

## 2012-04-13 ENCOUNTER — Encounter (HOSPITAL_COMMUNITY): Payer: Self-pay | Admitting: Anesthesiology

## 2012-04-13 ENCOUNTER — Encounter (HOSPITAL_COMMUNITY): Payer: Self-pay | Admitting: *Deleted

## 2012-04-13 ENCOUNTER — Ambulatory Visit (HOSPITAL_COMMUNITY): Payer: Medicare Other

## 2012-04-13 ENCOUNTER — Encounter (HOSPITAL_COMMUNITY)
Admission: RE | Disposition: A | Discharge status: Home / Self Care | Payer: Self-pay | Source: Ambulatory Visit | Attending: Vascular Surgery

## 2012-04-13 ENCOUNTER — Encounter (HOSPITAL_COMMUNITY): Payer: Self-pay | Admitting: Certified Registered Nurse Anesthetist

## 2012-04-13 ENCOUNTER — Ambulatory Visit (HOSPITAL_COMMUNITY): Payer: Medicare Other | Admitting: Anesthesiology

## 2012-04-13 ENCOUNTER — Telehealth: Payer: Self-pay | Admitting: Vascular Surgery

## 2012-04-13 ENCOUNTER — Ambulatory Visit (HOSPITAL_COMMUNITY)
Admission: RE | Admit: 2012-04-13 | Discharge: 2012-04-13 | Disposition: A | Discharge status: Home / Self Care | Payer: Medicare Other | Source: Ambulatory Visit | Attending: Vascular Surgery | Admitting: Vascular Surgery

## 2012-04-13 DIAGNOSIS — Z8673 Personal history of transient ischemic attack (TIA), and cerebral infarction without residual deficits: Secondary | ICD-10-CM | POA: Insufficient documentation

## 2012-04-13 DIAGNOSIS — T82898A Other specified complication of vascular prosthetic devices, implants and grafts, initial encounter: Secondary | ICD-10-CM | POA: Insufficient documentation

## 2012-04-13 DIAGNOSIS — N186 End stage renal disease: Secondary | ICD-10-CM | POA: Insufficient documentation

## 2012-04-13 DIAGNOSIS — I120 Hypertensive chronic kidney disease with stage 5 chronic kidney disease or end stage renal disease: Secondary | ICD-10-CM | POA: Insufficient documentation

## 2012-04-13 DIAGNOSIS — Z87891 Personal history of nicotine dependence: Secondary | ICD-10-CM | POA: Insufficient documentation

## 2012-04-13 DIAGNOSIS — Z992 Dependence on renal dialysis: Secondary | ICD-10-CM | POA: Insufficient documentation

## 2012-04-13 DIAGNOSIS — Y832 Surgical operation with anastomosis, bypass or graft as the cause of abnormal reaction of the patient, or of later complication, without mention of misadventure at the time of the procedure: Secondary | ICD-10-CM | POA: Insufficient documentation

## 2012-04-13 DIAGNOSIS — Z7982 Long term (current) use of aspirin: Secondary | ICD-10-CM | POA: Insufficient documentation

## 2012-04-13 DIAGNOSIS — M1710 Unilateral primary osteoarthritis, unspecified knee: Secondary | ICD-10-CM | POA: Insufficient documentation

## 2012-04-13 LAB — POCT I-STAT 4, (NA,K, GLUC, HGB,HCT)
HCT: 34 % — ABNORMAL LOW (ref 39.0–52.0)
Hemoglobin: 11.6 g/dL — ABNORMAL LOW (ref 13.0–17.0)

## 2012-04-13 LAB — GLUCOSE, CAPILLARY
Glucose-Capillary: 95 mg/dL (ref 70–99)
Glucose-Capillary: 92 mg/dL (ref 70–99)

## 2012-04-13 SURGERY — REVISON OF ARTERIOVENOUS FISTULA
Anesthesia: General | Site: Arm Lower | Laterality: Left | Wound class: Clean

## 2012-04-13 MED ORDER — PHENYLEPHRINE HCL 10 MG/ML IJ SOLN
INTRAMUSCULAR | Status: DC | PRN
  Administered 2012-04-13: 40 ug via INTRAVENOUS

## 2012-04-13 MED ORDER — PROPOFOL 10 MG/ML IV BOLUS
INTRAVENOUS | Status: DC | PRN
  Administered 2012-04-13: 100 mg via INTRAVENOUS

## 2012-04-13 MED ORDER — PROMETHAZINE HCL 25 MG/ML IJ SOLN: 6.2500 mg | INTRAMUSCULAR | Status: DC | PRN

## 2012-04-13 MED ORDER — FENTANYL CITRATE 0.05 MG/ML IJ SOLN
INTRAMUSCULAR | Status: DC | PRN
  Administered 2012-04-13 (×4): 50 ug via INTRAVENOUS

## 2012-04-13 MED ORDER — CEFAZOLIN SODIUM-DEXTROSE 2-3 GM-% IV SOLR
2.0000 g | INTRAVENOUS | Status: AC
  Administered 2012-04-13: 2 g via INTRAVENOUS
  Filled 2012-04-13 (×2): qty 50

## 2012-04-13 MED ORDER — SODIUM CHLORIDE 0.9 % IR SOLN
Status: DC | PRN
  Administered 2012-04-13: 08:00:00

## 2012-04-13 MED ORDER — OXYCODONE-ACETAMINOPHEN 7.5-325 MG PO TABS: 1.0000 | ORAL_TABLET | ORAL | Status: AC | PRN

## 2012-04-13 MED ORDER — FENTANYL CITRATE 0.05 MG/ML IJ SOLN: 25.0000 ug | INTRAMUSCULAR | Status: DC | PRN

## 2012-04-13 MED ORDER — SODIUM CHLORIDE 0.9 % IV SOLN: INTRAVENOUS | Status: DC

## 2012-04-13 MED ORDER — 0.9 % SODIUM CHLORIDE (POUR BTL) OPTIME
TOPICAL | Status: DC | PRN
  Administered 2012-04-13: 1000 mL

## 2012-04-13 MED ORDER — EPHEDRINE SULFATE 50 MG/ML IJ SOLN
INTRAMUSCULAR | Status: DC | PRN
  Administered 2012-04-13: 5 mg via INTRAVENOUS

## 2012-04-13 MED ORDER — SODIUM CHLORIDE 0.9 % IV SOLN
INTRAVENOUS | Status: DC | PRN
  Administered 2012-04-13: 08:00:00 via INTRAVENOUS

## 2012-04-13 MED ORDER — MUPIROCIN 2 % EX OINT
TOPICAL_OINTMENT | Freq: Two times a day (BID) | CUTANEOUS | Status: DC
  Administered 2012-04-13: 1 via NASAL
  Filled 2012-04-13 (×2): qty 22

## 2012-04-13 MED ORDER — MIDAZOLAM HCL 2 MG/2ML IJ SOLN: 1.0000 mg | INTRAMUSCULAR | Status: DC | PRN

## 2012-04-13 MED ORDER — HEPARIN SODIUM (PORCINE) 1000 UNIT/ML IJ SOLN
INTRAMUSCULAR | Status: DC | PRN
  Administered 2012-04-13: 5000 [IU] via INTRAVENOUS

## 2012-04-13 MED ORDER — LIDOCAINE HCL (CARDIAC) 10 MG/ML IV SOLN
INTRAVENOUS | Status: DC | PRN
  Administered 2012-04-13: 40 mg via INTRAVENOUS

## 2012-04-13 MED ORDER — FENTANYL CITRATE 0.05 MG/ML IJ SOLN: 50.0000 ug | INTRAMUSCULAR | Status: DC | PRN

## 2012-04-13 SURGICAL SUPPLY — 50 items
ADH SKN CLS APL DERMABOND .7 (GAUZE/BANDAGES/DRESSINGS) ×1
ADH SKN CLS LQ APL DERMABOND (GAUZE/BANDAGES/DRESSINGS) ×1
CANISTER SUCTION 2500CC (MISCELLANEOUS) ×2 IMPLANT
CLIP TI MEDIUM 6 (CLIP) ×2 IMPLANT
CLIP TI WIDE RED SMALL 6 (CLIP) ×2 IMPLANT
CLOTH BEACON ORANGE TIMEOUT ST (SAFETY) ×2 IMPLANT
COVER PROBE W GEL 5X96 (DRAPES) ×2 IMPLANT
COVER SURGICAL LIGHT HANDLE (MISCELLANEOUS) ×2 IMPLANT
DECANTER SPIKE VIAL GLASS SM (MISCELLANEOUS) ×1 IMPLANT
DERMABOND ADHESIVE PROPEN (GAUZE/BANDAGES/DRESSINGS) ×1
DERMABOND ADVANCED (GAUZE/BANDAGES/DRESSINGS) ×1
DERMABOND ADVANCED .7 DNX12 (GAUZE/BANDAGES/DRESSINGS) ×1 IMPLANT
DERMABOND ADVANCED .7 DNX6 (GAUZE/BANDAGES/DRESSINGS) IMPLANT
DRAIN PENROSE 1/4X12 LTX STRL (WOUND CARE) ×1 IMPLANT
ELECT REM PT RETURN 9FT ADLT (ELECTROSURGICAL) ×2
ELECTRODE REM PT RTRN 9FT ADLT (ELECTROSURGICAL) ×1 IMPLANT
GAUZE SPONGE 2X2 8PLY STRL LF (GAUZE/BANDAGES/DRESSINGS) ×1 IMPLANT
GAUZE SPONGE 4X4 16PLY XRAY LF (GAUZE/BANDAGES/DRESSINGS) ×1 IMPLANT
GEL ULTRASOUND 20GR AQUASONIC (MISCELLANEOUS) IMPLANT
GLOVE BIO SURGEON STRL SZ 6.5 (GLOVE) ×1 IMPLANT
GLOVE BIO SURGEON STRL SZ7.5 (GLOVE) ×2 IMPLANT
GLOVE BIOGEL PI IND STRL 6.5 (GLOVE) IMPLANT
GLOVE BIOGEL PI IND STRL 7.0 (GLOVE) IMPLANT
GLOVE BIOGEL PI IND STRL 7.5 (GLOVE) IMPLANT
GLOVE BIOGEL PI INDICATOR 6.5 (GLOVE) ×1
GLOVE BIOGEL PI INDICATOR 7.0 (GLOVE) ×1
GLOVE BIOGEL PI INDICATOR 7.5 (GLOVE) ×2
GLOVE ECLIPSE 6.0 STRL STRAW (GLOVE) ×1 IMPLANT
GLOVE SS BIOGEL STRL SZ 7 (GLOVE) IMPLANT
GLOVE SUPERSENSE BIOGEL SZ 7 (GLOVE) ×1
GLOVE SURG SS PI 7.5 STRL IVOR (GLOVE) ×1 IMPLANT
GOWN PREVENTION PLUS XLARGE (GOWN DISPOSABLE) ×3 IMPLANT
GOWN STRL NON-REIN LRG LVL3 (GOWN DISPOSABLE) ×4 IMPLANT
KIT BASIN OR (CUSTOM PROCEDURE TRAY) ×2 IMPLANT
KIT ROOM TURNOVER OR (KITS) ×2 IMPLANT
LOOP VESSEL MINI RED (MISCELLANEOUS) IMPLANT
NS IRRIG 1000ML POUR BTL (IV SOLUTION) ×2 IMPLANT
PACK CV ACCESS (CUSTOM PROCEDURE TRAY) ×2 IMPLANT
PAD ARMBOARD 7.5X6 YLW CONV (MISCELLANEOUS) ×4 IMPLANT
SPONGE GAUZE 2X2 STER 10/PKG (GAUZE/BANDAGES/DRESSINGS)
SPONGE SURGIFOAM ABS GEL 100 (HEMOSTASIS) IMPLANT
SUT PROLENE 5 0 C 1 24 (SUTURE) ×2 IMPLANT
SUT PROLENE 7 0 BV 1 (SUTURE) ×2 IMPLANT
SUT VIC AB 3-0 SH 27 (SUTURE) ×2
SUT VIC AB 3-0 SH 27X BRD (SUTURE) ×1 IMPLANT
SUT VICRYL 4-0 PS2 18IN ABS (SUTURE) ×2 IMPLANT
TOWEL OR 17X24 6PK STRL BLUE (TOWEL DISPOSABLE) ×2 IMPLANT
TOWEL OR 17X26 10 PK STRL BLUE (TOWEL DISPOSABLE) ×2 IMPLANT
UNDERPAD 30X30 INCONTINENT (UNDERPADS AND DIAPERS) ×2 IMPLANT
WATER STERILE IRR 1000ML POUR (IV SOLUTION) ×2 IMPLANT

## 2012-04-13 NOTE — Anesthesia Preprocedure Evaluation (Addendum)
Anesthesia Evaluation  Patient identified by MRN, date of birth, ID band Patient awake    Reviewed: Allergy & Precautions, H&P , NPO status , Patient's Chart, lab work & pertinent test results  Airway Mallampati: I TM Distance: >3 FB Neck ROM: Full    Dental  (+) Chipped, Teeth Intact and Dental Advisory Given,    Pulmonary  breath sounds clear to auscultation        Cardiovascular hypertension, Pt. on medications Rhythm:Regular Rate:Normal     Neuro/Psych Residual weakness L side  Neuromuscular disease CVA    GI/Hepatic   Endo/Other    Renal/GU CRF and DialysisRenal disease     Musculoskeletal   Abdominal   Peds  Hematology   Anesthesia Other Findings   Reproductive/Obstetrics                         Anesthesia Physical Anesthesia Plan  ASA: III  Anesthesia Plan: General   Post-op Pain Management:    Induction: Intravenous  Airway Management Planned: LMA  Additional Equipment:   Intra-op Plan:   Post-operative Plan: Extubation in OR  Informed Consent: I have reviewed the patients History and Physical, chart, labs and discussed the procedure including the risks, benefits and alternatives for the proposed anesthesia with the patient or authorized representative who has indicated his/her understanding and acceptance.     Plan Discussed with: CRNA and Surgeon  Anesthesia Plan Comments:         Anesthesia Quick Evaluation

## 2012-04-13 NOTE — Telephone Encounter (Addendum)
Message copied by Rosalyn Charters on Tue Apr 13, 2012 11:11 AM ------      Message from: Sherren Kerns      Created: Tue Apr 13, 2012  9:38 AM       Plication left arm avf       Nurse asst            Needs follow up 2-3 weeks            [name]  notified patient of fu appt. with dr. Darrick Penna on 04-11-12 at 2:30

## 2012-04-13 NOTE — Transfer of Care (Signed)
Immediate Anesthesia Transfer of Care Note  Patient: [name]  Procedure(s) Performed: Procedure(s) (LRB): REVISON OF ARTERIOVENOUS FISTULA (Left)  Patient Location: PACU  Anesthesia Type: General  Level of Consciousness: awake, alert  and patient cooperative  Airway & Oxygen Therapy: Patient Spontanous Breathing and Patient connected to face mask oxygen  Post-op Assessment: Report given to PACU RN and Post -op Vital signs reviewed and stable  Post vital signs: Reviewed and stable  Complications: No apparent anesthesia complications

## 2012-04-13 NOTE — Anesthesia Postprocedure Evaluation (Signed)
  Anesthesia Post-op Note  Patient: [name]  Procedure(s) Performed: Procedure(s) (LRB) with comments: REVISON OF ARTERIOVENOUS FISTULA (Left) - Plication of aneurysm left forearm arteriovenous fistula  Patient Location: PACU  Anesthesia Type: General  Level of Consciousness: awake and alert   Airway and Oxygen Therapy: Patient Spontanous Breathing  Post-op Pain: mild  Post-op Assessment: Post-op Vital signs reviewed and Patient's Cardiovascular Status Stable  Post-op Vital Signs: stable  Complications: No apparent anesthesia complications

## 2012-04-13 NOTE — Interval H&P Note (Signed)
History and Physical Interval Note:  04/13/2012 7:37 AM  [name]  has presented today for surgery, with the diagnosis of Aneurysm left forearm arteriovenous fistula  The various methods of treatment have been discussed with the patient and family. After consideration of risks, benefits and other options for treatment, the patient has consented to  Procedure(s) (LRB): REVISON OF ARTERIOVENOUS FISTULA (Left) as a surgical intervention .  The patient's history has been reviewed, patient examined, no change in status, stable for surgery.  I have reviewed the patient's chart and labs.  Questions were answered to the patient's satisfaction.     [name] E

## 2012-04-13 NOTE — Op Note (Signed)
Procedure: Revision left Radial Cephalic AV Fistula with aneurysm plication PreOp: Aneurysmal degeneration AVF left arm PostOp: same Anesthesia: General  Findings: Plication of approximately 4 cm length 4 cm diameter aneurysm  Operative details: After obtaining informed consent, the patient was taken to the operating room. The patient was placed in supine position on the operating table. After adequate sedation, the patient's entire left upper extremity was prepped and draped in the usual sterile fashion. A longitudinal incision was made over a fistula aneurysm in the mid forearm arm. The incision was carried onto the subcutaneous tissues down to level of the pre-existing AV fistula. The fistula was patent and did have a thrill within it. The fisutla was dissected free circumferentially proximal and distal to the aneurysm as well as the aneurysm.  The patient was given 5000 units of intravenous heparin. The fistula was clamped proximal and distal to the aneurysm. A longitudinal opening was made in the aneurysm with an 11 blade. I resected several centimeters of aneurysm tissue from both walls and the re approximated the aneurysm with large bites of tissue using a running 5 0 prolene leaving a residual 6-7 mm diameter fistula.  Just prior to completion of the anastomosis, it was forebled backbled and thoroughly flushed.  The anastomosis was secured; clamps released; and  a palpable thrill was palpable in the fistula immediately.  An ellipse of redundant skin was removed. Hemostasis was obtained.  Next subcutaneous tissues of each incision were reapproximated using running 3-0 Vicryl suture. The skin was closed with 4 Vicryl subcuticular stitch. The patient tolerated the procedure well and there were no complications. Instrument sponge and needle counts were correct at the end of the case. The patient was taken to the recovery room in stable condition.  Fabienne Bruns, MD [facility]  [place] Office: [phone] Pager: [phone]-

## 2012-04-13 NOTE — H&P (View-Only) (Signed)
[facility] [place] HISTORY AND PHYSICAL   History of Present Illness:  Patient is a 78 y.o. 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 The patient states the aneurysm on his left forearm has doubled in size in the last 6 months. He has had no bleeding episodes. Currently the fistula has been cannulated several centimeters proximal to the aneurysm. He is using a buttonhole technique. He dialyzes on Monday Wednesday and Friday. Other chronic medical problems include hypertension, diabetes, arthritis. These are all currently controlled  Past Medical History  Diagnosis Date  . Hypertension   . Diabetes mellitus     typeII / No meds  . Cataract 01/2002    Right  . Dialysis patient 10/13/2007  . Radiculopathy 05/14/2006    NCV study negative carpal tunnel, Left C5 radiculopathy  . Arthritis     Knee pain  . Stroke   . ESRD (end stage renal disease) on dialysis 2009    [facility] diaylsis Monday , Wed. and Friday per Dr. Goldsborough    Past Surgical History  Procedure Date  . Varicose vein surgery 1978  . External fixation wrist fracture 1990  . Thyroidectomy, partial 08/03/2003    Right thyroid lobectomy, subtotal parathyr. sec hyperparthyroidismadenosis  . Av fistula placement 07/2004    Left arm AV fistula placement ([place] via [place])     Social History History  Substance Use Topics  . Smoking status: Former Smoker -- 10 years    Types: Cigarettes    Quit date: 04/01/2002  . Smokeless tobacco: Never Used   Comment: quit ove 20 years  . Alcohol Use: No    Family History Family History  Problem Relation Age of Onset  . Cancer Father 65    Lung Cancer//? stomach cancer  . Cancer Sister 69    brain tumor    Allergies  No Known Allergies   Current Outpatient Prescriptions  Medication Sig Dispense Refill  . aspirin 81 MG EC tablet Take 81 mg by mouth daily.        . cinacalcet (SENSIPAR) 30 MG tablet  Take 30 mg by mouth 2 (two) times daily.       . dipyridamole-aspirin (AGGRENOX) 25-200 MG per 12 hr capsule Take 1 capsule by mouth daily.      . lanthanum (FOSRENOL) 1000 MG chewable tablet Chew 1,000 mg by mouth 3 (three) times daily with meals.        . multivitamin (RENA-VIT) TABS tablet Take 1 tablet by mouth daily.        . Omega-3 Fatty Acids (FISH OIL) 1000 MG CAPS Take 1 capsule by mouth daily.        . ONE TOUCH ULTRA TEST test strip TEST BLOOD GLUCOSE ONE TIME DAILY  100 each  10    ROS:   General:  No weight loss, Fever, chills  HEENT: No recent headaches, no nasal bleeding, no visual changes, no sore throat  Neurologic: No dizziness, blackouts, seizures. No recent symptoms of stroke or mini- stroke. No recent episodes of slurred speech, or temporary blindness.  Cardiac: No recent episodes of chest pain/pressure, no shortness of breath at rest.  No shortness of breath with exertion.  Denies history of atrial fibrillation or irregular heartbeat  Vascular: No history of rest pain in feet.  No history of claudication.  No history of non-healing ulcer, No history of DVT   Pulmonary: No home oxygen, no productive cough, no   hemoptysis,  No asthma or wheezing  Musculoskeletal:  [ ] Arthritis, [ ] Low back pain,  [ ] Joint pain  Hematologic:No history of hypercoagulable state.  No history of easy bleeding.  No history of anemia  Gastrointestinal: No hematochezia or melena,  No gastroesophageal reflux, no trouble swallowing  Urinary: [ ] chronic Kidney disease, [x ] on HD - [ x] MWF or [ ] TTHS, [ ] Burning with urination, [ ] Frequent urination, [ ] Difficulty urinating;   Skin: No rashes  Psychological: No history of anxiety,  No history of depression   Physical Examination  Filed Vitals:   04/01/12 1543  BP: 150/69  Pulse: 85  Resp: 18  Height: 6' 4" (1.93 m)  Weight: 215 lb (97.523 kg)    Body mass index is 26.17 kg/(m^2).  General:  Alert and oriented, no  acute distress HEENT: Normal Neck: No bruit or JVD Pulmonary: Clear to auscultation bilaterally Cardiac: Regular Rate and Rhythm  Skin: No rash Extremity Pulses:  2+ radial, brachial pulses bilaterally, 4 x 3 cm aneurysm proximal third left radiocephalic AV fistula, palpable thrill in fistula left hand is pink and warm  DATA: The patient had a duplex ultrasound AV fistula today. There is no significant narrowing except for potentially right after the area of pseudoaneurysm. 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 Procedure is scheduled for Tuesday, 04/13/2012. Procedure details risks benefits possible complications were discussed the patient today. He understands and agrees to proceed.  Patrick Fanning, MD [facility] [place] Office: [phone] Pager: [phone]   Patrick Rijo, MD [facility] [place] Office: [phone] Pager: [phone]  

## 2012-04-28 ENCOUNTER — Encounter: Payer: Self-pay | Admitting: Vascular Surgery

## 2012-04-29 ENCOUNTER — Encounter: Payer: Self-pay | Admitting: Vascular Surgery

## 2012-04-29 ENCOUNTER — Ambulatory Visit (INDEPENDENT_AMBULATORY_CARE_PROVIDER_SITE_OTHER): Payer: Medicare Other | Admitting: Vascular Surgery

## 2012-04-29 VITALS — BP 123/66 | Temp 98.1°F | Ht 76.0 in | Wt 209.0 lb

## 2012-04-29 DIAGNOSIS — N186 End stage renal disease: Secondary | ICD-10-CM

## 2012-04-29 DIAGNOSIS — T82898A Other specified complication of vascular prosthetic devices, implants and grafts, initial encounter: Secondary | ICD-10-CM

## 2012-04-29 NOTE — Progress Notes (Addendum)
[facility] [place] Postoperative Visit hemodialysis access   Date of Surgery:  04/13/12 Surgeon: Darrick Penna HD:  yes   CC: f/u for Revision left Radial Cephalic AV Fistula with aneurysm plication   HPI:  This is a 78 y.o. male who returns today s/p Revision left Radial Cephalic AV Fistula with aneurysm plication on 04/13/12 by Dr. Darrick Penna.  Pt states they are using his fistula proximal to the plication and without difficulty.  PHYSICAL EXAMINATION:  Filed Vitals:   04/29/12 1448  BP: 123/66  Temp: 98.1 F (36.7 C)     Incision is healing nicely sensation in digits is intact; There is  Thrill; there is bruit. The graft/fistula is easily palpable  Pulse:  + palpable left radial pulse  ASSESSMENT/PLAN:  [name] is a 78 y.o. year old male who presents s/p Revision left Radial Cephalic AV Fistula with aneurysm plication.  Pt is doing well from surgery. Incision is healing nicely. Would give the plication a total of 6 more weeks before using. Pt will return on an as needed basis.  Doreatha Massed, PA-C [facility] [phone]  Clinic MD:   [facility]     History and exam details as above. He has an easily palpable thrill in the left arm fistula. His incision is well-healed. There are currently using a buttonhole technique above the area of fistula repair. The lower portion of the fistula should be ready for cannulation approximate 6 weeks if needed. He will followup on as-needed basis.  Fabienne Bruns, MD [facility] [place] Office: [phone] Pager: [phone]

## 2012-08-18 ENCOUNTER — Emergency Department (HOSPITAL_COMMUNITY)
Admission: EM | Admit: 2012-08-18 | Discharge: 2012-08-18 | Disposition: A | Discharge status: Home / Self Care | Payer: Medicare Other | Attending: Emergency Medicine | Admitting: Emergency Medicine

## 2012-08-18 ENCOUNTER — Encounter (HOSPITAL_COMMUNITY): Payer: Self-pay | Admitting: Emergency Medicine

## 2012-08-18 DIAGNOSIS — E119 Type 2 diabetes mellitus without complications: Secondary | ICD-10-CM | POA: Insufficient documentation

## 2012-08-18 DIAGNOSIS — I120 Hypertensive chronic kidney disease with stage 5 chronic kidney disease or end stage renal disease: Secondary | ICD-10-CM | POA: Insufficient documentation

## 2012-08-18 DIAGNOSIS — Z8739 Personal history of other diseases of the musculoskeletal system and connective tissue: Secondary | ICD-10-CM | POA: Insufficient documentation

## 2012-08-18 DIAGNOSIS — Z8669 Personal history of other diseases of the nervous system and sense organs: Secondary | ICD-10-CM | POA: Insufficient documentation

## 2012-08-18 DIAGNOSIS — W19XXXA Unspecified fall, initial encounter: Secondary | ICD-10-CM | POA: Insufficient documentation

## 2012-08-18 DIAGNOSIS — N186 End stage renal disease: Secondary | ICD-10-CM | POA: Insufficient documentation

## 2012-08-18 DIAGNOSIS — Z992 Dependence on renal dialysis: Secondary | ICD-10-CM | POA: Insufficient documentation

## 2012-08-18 DIAGNOSIS — Z87891 Personal history of nicotine dependence: Secondary | ICD-10-CM | POA: Insufficient documentation

## 2012-08-18 DIAGNOSIS — Z7982 Long term (current) use of aspirin: Secondary | ICD-10-CM | POA: Insufficient documentation

## 2012-08-18 DIAGNOSIS — Y92009 Unspecified place in unspecified non-institutional (private) residence as the place of occurrence of the external cause: Secondary | ICD-10-CM | POA: Insufficient documentation

## 2012-08-18 DIAGNOSIS — Y939 Activity, unspecified: Secondary | ICD-10-CM | POA: Insufficient documentation

## 2012-08-18 DIAGNOSIS — Z043 Encounter for examination and observation following other accident: Secondary | ICD-10-CM | POA: Insufficient documentation

## 2012-08-18 DIAGNOSIS — Z8673 Personal history of transient ischemic attack (TIA), and cerebral infarction without residual deficits: Secondary | ICD-10-CM | POA: Insufficient documentation

## 2012-08-18 DIAGNOSIS — Z79899 Other long term (current) drug therapy: Secondary | ICD-10-CM | POA: Insufficient documentation

## 2012-08-18 DIAGNOSIS — R5381 Other malaise: Secondary | ICD-10-CM | POA: Insufficient documentation

## 2012-08-18 NOTE — ED Notes (Addendum)
Per EMS: Pt fell around 6pm - lost balance and fell backward onto bottom. Did not hit head. Denies LOC, Back pain, Neck pain. Family asked him to get checked out.  Pt had successful dialysis today.  Emesis x1 in ambulance - pt stated he has motion sickness. Denies pain.

## 2012-08-18 NOTE — ED Provider Notes (Signed)
History     CSN: [id number]  Arrival date & time 08/18/12  [place]   First MD Initiated Contact with Patient 08/18/12 2002      Chief Complaint  Patient presents with  . Fall   HPI  History provided by the patient and daughter. Patient is a 79 year old male with history of hypertension, diabetes, end-stage renal disease on dialysis who presents after a fall. Patient reports having normal dialysis earlier today. After dialysis patient does report going shopping and having increased activity per his usual. Patient states he is normally tired after dialysis and goes home and rests right away but today had other things to do. When he finally returned home he was feeling tired from the day and reports stumbling slightly with some weakness in legs causing him to fall backwards onto his bottom. He denies falling onto his back or hitting his head. He denies any syncope or loss of consciousness. Patient was helped up by family and able to stand and walk to a chair prior to coming to the emergency room for evaluation. Patient denies having any chest pain, shortness of breath or heart palpitations prior to, during or after his fall.  He denies any pain at this time.denies any other associated symptoms.    Past Medical History  Diagnosis Date  . Hypertension   . Diabetes mellitus     typeII / No meds  . Cataract 01/2002    Right  . Dialysis patient 10/13/2007  . Radiculopathy 05/14/2006    NCV study negative carpal tunnel, Left C5 radiculopathy  . Arthritis     Knee pain  . ESRD (end stage renal disease) on dialysis 2009    [facility] diaylsis Monday , Wed. and Friday per Dr. Kathrene Bongo  . Stroke     Past Surgical History  Procedure Date  . Varicose vein surgery 1978  . External fixation wrist fracture 1990  . Thyroidectomy, partial 08/03/2003    Right thyroid lobectomy, subtotal parathyr. sec hyperparthyroidismadenosis  . Av fistula placement 07/2004    Left arm AV fistula  placement [name] via [place])  . Eye surgery     Cataract    Family History  Problem Relation Age of Onset  . Cancer Father 59    Lung Cancer//? stomach cancer  . Cancer Sister 57    brain tumor    History  Substance Use Topics  . Smoking status: Former Smoker -- 10 years    Types: Cigarettes    Quit date: 04/01/2002  . Smokeless tobacco: Never Used     Comment: quit ove 20 years  . Alcohol Use: No      Review of Systems  All other systems reviewed and are negative.    Allergies  Review of patient's allergies indicates no known allergies.  Home Medications   Current Outpatient Rx  Name  Route  Sig  Dispense  Refill  . ASPIRIN 81 MG PO TBEC   Oral   Take 81 mg by mouth daily.           [name] Kitchen CINACALCET HCL 30 MG PO TABS   Oral   Take 30 mg by mouth 2 (two) times daily.          . ASPIRIN-DIPYRIDAMOLE ER 25-200 MG PO CP12   Oral   Take 1 capsule by mouth 2 (two) times daily.         [name] Kitchen LANTHANUM CARBONATE 1000 MG PO CHEW   Oral   Chew 1,000 mg  by mouth 3 (three) times daily with meals.           [name] Kitchen RENA-VITE PO TABS   Oral   Take 1 tablet by mouth daily.           [name] Kitchen FISH OIL 1000 MG PO CAPS   Oral   Take 1 capsule by mouth daily.             BP 105/54  Pulse 84  Temp 97.5 F (36.4 C)  Resp 21  SpO2 99%  Physical Exam  Nursing note and vitals reviewed. Constitutional: He is oriented to person, place, and time. He appears well-developed and well-nourished. No distress.  HENT:  Head: Normocephalic and atraumatic.       No sinus pressure or nasal congestion  Eyes: Conjunctivae normal and EOM are normal. Pupils are equal, round, and reactive to light.  Neck: Normal range of motion. Neck supple.       No cervical midline tenderness  Cardiovascular: Normal rate and regular rhythm.   Pulmonary/Chest: Effort normal and breath sounds normal. No respiratory distress. He has no wheezes.  Abdominal: Soft. There is no tenderness. There is no  rebound and no guarding.  Musculoskeletal: Normal range of motion. He exhibits no edema and no tenderness.       No tenderness, deformities or swelling.  Neurological: He is alert and oriented to person, place, and time. He has normal strength. No cranial nerve deficit or sensory deficit. Coordination and gait normal.       Normal gait using a cane. Normal finger to nose coordination.  Strength equal bilaterally  Skin: Skin is warm. No rash noted.  Psychiatric: He has a normal mood and affect. His behavior is normal.    ED Course  Procedures      1. Fall       MDM  8:10 PM patient seen and evaluated. Patient resting comfortably and appears well in no acute distress and no complaints of pain.  Patient with no significant signs of injury from fall. Patient takes fall was mechanical with stumbling and weakness in legs following dialysis and increased activity today. Normal nonfocal neuro exam. I discussed with patient and daughter options for CT scan of head to rule out any possibilities of injury or bleed.  At this time they do not wish to have this performed.  No other indications for imaging at this time.patient will be discharged home with strict return precautions.        Angus Seller, [place] 08/18/12 2040

## 2012-08-19 NOTE — ED Provider Notes (Signed)
Medical screening examination/treatment/procedure(s) were performed by non-physician practitioner and as supervising physician I was immediately available for consultation/collaboration.   Joya Gaskins, MD 08/19/12 [phone]

## 2012-08-31 ENCOUNTER — Ambulatory Visit (INDEPENDENT_AMBULATORY_CARE_PROVIDER_SITE_OTHER): Payer: Medicare Other | Admitting: Family Medicine

## 2012-08-31 ENCOUNTER — Encounter: Payer: Self-pay | Admitting: Family Medicine

## 2012-08-31 VITALS — BP 118/68 | HR 71 | Temp 97.6°F | Wt 204.0 lb

## 2012-08-31 DIAGNOSIS — R634 Abnormal weight loss: Secondary | ICD-10-CM

## 2012-08-31 DIAGNOSIS — R972 Elevated prostate specific antigen [PSA]: Secondary | ICD-10-CM

## 2012-08-31 NOTE — Patient Instructions (Signed)
Go to the lab on the way out.  We'll contact you with your lab report.  If the thyroid test is abnormal, we'll treat it.  If it is normal, then I would keep an eye on your weight and appetite.  Notify me if either gets worse.  Take care.

## 2012-08-31 NOTE — Progress Notes (Signed)
Prev seen in ER after a fall after getting up.  He had been to HD and busier than normal that.  Happened right after standing.  No LOC.  ER eval unremarkable.   Over the years with gradual weight loss and overall dec in appetite.  Food still tastes normal to him.  No FCNAVD, no rash.  No blood in stool.  No cough or other localized sx o/w.  No abd pain.  H/o thyroid surgery years ago.   No neck mass.   Labs have been checked at HD and reviewed today.  He does have typical anemia of renal disease, but not especially low.   PMH and SH reviewed  ROS: See HPI, otherwise noncontributory.  Meds, vitals, and allergies reviewed.   nad ncat Mmm, no OP erythema Neck supple, no masses rrr ctab abd soft, not ttp Ext with pitting edema L arm with thrill at HD site, appears intact

## 2012-08-31 NOTE — Assessment & Plan Note (Signed)
Some of this is expected with HD and aging.  He doesn't have alarming sx.  Sugar has been controlled with A1c 5-6 on outside labs per patient.  Would check TSH today and monitor sx if wnl.  He agrees.  He doesn't have focal point to start w/u at this point o/w.  >25 min spent with face to face with patient, >50% counseling and/or coordinating care.

## 2012-09-01 LAB — TSH: TSH: 0.94 u[IU]/mL (ref 0.35–5.50)

## 2012-10-30 ENCOUNTER — Emergency Department (HOSPITAL_COMMUNITY)
Admission: EM | Admit: 2012-10-30 | Discharge: 2012-10-30 | Disposition: A | Discharge status: Home / Self Care | Payer: Medicare Other | Attending: Emergency Medicine | Admitting: Emergency Medicine

## 2012-10-30 ENCOUNTER — Encounter (HOSPITAL_COMMUNITY): Payer: Self-pay | Admitting: Family Medicine

## 2012-10-30 ENCOUNTER — Emergency Department (HOSPITAL_COMMUNITY): Payer: Medicare Other

## 2012-10-30 DIAGNOSIS — M129 Arthropathy, unspecified: Secondary | ICD-10-CM | POA: Insufficient documentation

## 2012-10-30 DIAGNOSIS — I120 Hypertensive chronic kidney disease with stage 5 chronic kidney disease or end stage renal disease: Secondary | ICD-10-CM | POA: Insufficient documentation

## 2012-10-30 DIAGNOSIS — Z8673 Personal history of transient ischemic attack (TIA), and cerebral infarction without residual deficits: Secondary | ICD-10-CM | POA: Insufficient documentation

## 2012-10-30 DIAGNOSIS — Z8669 Personal history of other diseases of the nervous system and sense organs: Secondary | ICD-10-CM | POA: Insufficient documentation

## 2012-10-30 DIAGNOSIS — Z8739 Personal history of other diseases of the musculoskeletal system and connective tissue: Secondary | ICD-10-CM | POA: Insufficient documentation

## 2012-10-30 DIAGNOSIS — Z7982 Long term (current) use of aspirin: Secondary | ICD-10-CM | POA: Insufficient documentation

## 2012-10-30 DIAGNOSIS — M1710 Unilateral primary osteoarthritis, unspecified knee: Secondary | ICD-10-CM | POA: Insufficient documentation

## 2012-10-30 DIAGNOSIS — M1712 Unilateral primary osteoarthritis, left knee: Secondary | ICD-10-CM

## 2012-10-30 DIAGNOSIS — N186 End stage renal disease: Secondary | ICD-10-CM | POA: Insufficient documentation

## 2012-10-30 DIAGNOSIS — Z87891 Personal history of nicotine dependence: Secondary | ICD-10-CM | POA: Insufficient documentation

## 2012-10-30 DIAGNOSIS — Z992 Dependence on renal dialysis: Secondary | ICD-10-CM | POA: Insufficient documentation

## 2012-10-30 DIAGNOSIS — E119 Type 2 diabetes mellitus without complications: Secondary | ICD-10-CM | POA: Insufficient documentation

## 2012-10-30 DIAGNOSIS — Z79899 Other long term (current) drug therapy: Secondary | ICD-10-CM | POA: Insufficient documentation

## 2012-10-30 DIAGNOSIS — IMO0002 Reserved for concepts with insufficient information to code with codable children: Secondary | ICD-10-CM | POA: Insufficient documentation

## 2012-10-30 MED ORDER — HYDROCODONE-ACETAMINOPHEN 5-325 MG PO TABS: 1.0000 | ORAL_TABLET | Freq: Four times a day (QID) | ORAL | Status: DC | PRN

## 2012-10-30 MED ORDER — HYDROCODONE-ACETAMINOPHEN 5-325 MG PO TABS
1.0000 | ORAL_TABLET | Freq: Once | ORAL | Status: AC
  Administered 2012-10-30: 1 via ORAL
  Filled 2012-10-30: qty 1

## 2012-10-30 NOTE — ED Notes (Signed)
Per EMS, pt sts was standing there and had intense pain in left knee. sts we he bends it is worse and unable to bear weight. BP 168/86 HR 74. RR 18. Pt from home today.

## 2012-10-30 NOTE — ED Notes (Signed)
EDP in room with pt 

## 2012-10-30 NOTE — ED Provider Notes (Signed)
History     CSN: [id number]  Arrival date & time 10/30/12  1736   First MD Initiated Contact with Patient 10/30/12 1846      Chief Complaint  Patient presents with  . Knee Pain    (Consider location/radiation/quality/duration/timing/severity/associated sxs/prior treatment) HPI Complains of left knee pain onset this morning. Pain is worse with movement of his knee improved with remaining still diffuse. No other complaint pain is moderate no treatment prior to coming here. No other associated symptoms. Patient was able to walk with his walker today, with pain at his left knee Past Medical History  Diagnosis Date  . Hypertension   . Diabetes mellitus     typeII / No meds  . Cataract 01/2002    Right  . Dialysis patient 10/13/2007  . Radiculopathy 05/14/2006    NCV study negative carpal tunnel, Left C5 radiculopathy  . Arthritis     Knee pain  . ESRD (end stage renal disease) on dialysis 2009    [facility] diaylsis Monday , Wed. and Friday per Dr. Kathrene Bongo  . Stroke   . Elevated PSA     h/o, pt had declined further eval.     Past Surgical History  Procedure Laterality Date  . Varicose vein surgery  1978  . External fixation wrist fracture  1990  . Thyroidectomy, partial  08/03/2003    Right thyroid lobectomy, subtotal parathyr. sec hyperparthyroidismadenosis  . Av fistula placement  07/2004    Left arm AV fistula placement [name] via [place])  . Eye surgery      Cataract    Family History  Problem Relation Age of Onset  . Cancer Father 53    Lung Cancer//? stomach cancer  . Cancer Sister 61    brain tumor    History  Substance Use Topics  . Smoking status: Former Smoker -- 10 years    Types: Cigarettes    Quit date: 04/01/2002  . Smokeless tobacco: Never Used     Comment: quit ove 20 years  . Alcohol Use: No      Review of Systems  Constitutional: Negative.   Musculoskeletal: Positive for arthralgias.       Left knee pain   Neurological: Negative.     Allergies  Review of patient's allergies indicates no known allergies.  Home Medications   Current Outpatient Rx  Name  Route  Sig  Dispense  Refill  . aspirin 81 MG EC tablet   Oral   Take 81 mg by mouth daily.           . cinacalcet (SENSIPAR) 30 MG tablet   Oral   Take 30 mg by mouth 2 (two) times daily.          [name] Kitchen dipyridamole-aspirin (AGGRENOX) 200-25 MG per 12 hr capsule   Oral   Take 1 capsule by mouth 2 (two) times daily.         [name] Kitchen lanthanum (FOSRENOL) 1000 MG chewable tablet   Oral   Chew 1,000 mg by mouth 3 (three) times daily with meals.           . multivitamin (RENA-VIT) TABS tablet   Oral   Take 1 tablet by mouth daily.           . Omega-3 Fatty Acids (FISH OIL) 1000 MG CAPS   Oral   Take 1 capsule by mouth daily.             BP 160/78  Pulse 72  Temp(Src) 98.3 F (36.8 C)  Resp 18  SpO2 100%  Physical Exam  Nursing note and vitals reviewed. Constitutional: He appears well-developed and well-nourished.  HENT:  Head: Normocephalic and atraumatic.  Eyes: EOM are normal.  Neck: Neck supple.  Cardiovascular: Normal rate.   Musculoskeletal:  Left lower extremity knee is without swelling redness or warmth. Pain at knee on active or passive extension. No deformity. DP pulse 2+. Left upper extremity with dialysis graft with good thrill. All other extremities no redness swelling or tenderness neurovascularly intact    ED Course  Procedures (including critical care time)  Labs Reviewed - No data to display No results found.   No diagnosis found.    MDM  No signs of infection  Ms Manus Rudd to check xray and will dispoition pt Diagnosis left knee pain      Doug Sou, MD 10/30/12 2015

## 2012-10-30 NOTE — ED Notes (Signed)
Patient transported to X-ray 

## 2012-10-30 NOTE — ED Provider Notes (Signed)
  Physical Exam  BP 160/78  Pulse 72  Temp(Src) 98.3 F (36.8 C)  Resp 18  SpO2 100%  Physical Exam Asked to review x-rays reveals he has tricompartmental degenerative joint disease in his left knee.  He has been fitted with a knee sleeve.  He has been ambulated with a walker, and discharged home with a prescription for Vicodin, and a referral to Dr.Olin, For further evaluation ED Course  Procedures  MDM       Arman Filter, NP 10/30/12 2141

## 2012-10-30 NOTE — Progress Notes (Signed)
Orthopedic Tech Progress Note Patient Details:  [name] [birth year] [id number]  Ortho Devices Type of Ortho Device: Knee Sleeve Ortho Device/Splint Location: (R) LE Ortho Device/Splint Interventions: Ordered;Application   [name] 10/30/2012, 9:25 PM

## 2012-10-31 NOTE — ED Provider Notes (Signed)
Medical screening examination/treatment/procedure(s) were conducted as a shared visit with non-physician practitioner(s) and myself.  I personally evaluated the patient during the encounter  Doug Sou, MD 10/31/12 1105

## 2012-11-01 ENCOUNTER — Ambulatory Visit: Payer: Medicare Other | Admitting: Family Medicine

## 2012-11-01 ENCOUNTER — Telehealth: Payer: Self-pay | Admitting: *Deleted

## 2012-11-01 NOTE — Telephone Encounter (Signed)
They have made arrangements to get him to dialysis today so wife says she will keep the 11 am tomorrow but thank you so much anyway.

## 2012-11-01 NOTE — Telephone Encounter (Signed)
I phoned the patient and his wife says that they went to the ER and they x-rayed him and say that he is unable to walk because of severe arthritis and that he needs a wheelchair.  Wife wants an appt with Dr. Para March right away.  The earliest appt available is tomorrow at 11 am which they accepted.  They, however, were hoping for an appt today.  He is not in pain, he just cannot walk.  Please advise.

## 2012-11-01 NOTE — Telephone Encounter (Signed)
What about today at 4PM?

## 2012-11-01 NOTE — Telephone Encounter (Signed)
Pt was in hospital at [facility] on 10/29/12.  He needs to have a wheelchair and they instructed him to call his PCP.    Best number for patient [phone].

## 2012-11-02 ENCOUNTER — Ambulatory Visit (INDEPENDENT_AMBULATORY_CARE_PROVIDER_SITE_OTHER): Payer: Medicare Other | Admitting: Family Medicine

## 2012-11-02 ENCOUNTER — Encounter: Payer: Self-pay | Admitting: Family Medicine

## 2012-11-02 VITALS — BP 104/60 | HR 99 | Temp 97.9°F

## 2012-11-02 DIAGNOSIS — M25562 Pain in left knee: Secondary | ICD-10-CM

## 2012-11-02 DIAGNOSIS — R55 Syncope and collapse: Secondary | ICD-10-CM

## 2012-11-02 DIAGNOSIS — M25569 Pain in unspecified knee: Secondary | ICD-10-CM

## 2012-11-02 HISTORY — DX: Syncope and collapse: R55

## 2012-11-02 MED ORDER — HYDROCODONE-ACETAMINOPHEN 5-325 MG PO TABS: 1.0000 | ORAL_TABLET | Freq: Four times a day (QID) | ORAL | Status: DC | PRN

## 2012-11-02 NOTE — Progress Notes (Signed)
Saturday was in [place], didn't fall but L knee started hurting.  Recently seen at ER for same. xrays with DJD noted.  Still with sig L knee pain.  Pain with any movement. He can't bear weight.  Doesn't think he can use crutches. Was referred to Dr. Charlann Boxer, hasn't seen ortho yet.   Meds, vitals, and allergies reviewed.   ROS: See HPI.  Otherwise, noncontributory.  nad A&O Not ttp on the hips and ankles B, not ttp on the R knee L knee diffusely ttp with any ROM, even PROM No bruising but chronic changes noted at the joint line

## 2012-11-02 NOTE — Patient Instructions (Addendum)
Call about the appointment with Dr. Charlann Boxer.  Take the pain medicine in the meantime and see about getting a wheelchair with a leg extension.

## 2012-11-03 ENCOUNTER — Inpatient Hospital Stay (HOSPITAL_COMMUNITY)
Admission: EM | Admit: 2012-11-03 | Discharge: 2012-11-08 | DRG: 312 | Disposition: A | Discharge status: Skilled Nursing Facility | Payer: Medicare Other | Attending: Internal Medicine | Admitting: Internal Medicine

## 2012-11-03 ENCOUNTER — Emergency Department (HOSPITAL_COMMUNITY): Payer: Medicare Other

## 2012-11-03 ENCOUNTER — Other Ambulatory Visit: Payer: Self-pay

## 2012-11-03 ENCOUNTER — Encounter (HOSPITAL_COMMUNITY): Payer: Self-pay | Admitting: Family Medicine

## 2012-11-03 DIAGNOSIS — Y92009 Unspecified place in unspecified non-institutional (private) residence as the place of occurrence of the external cause: Secondary | ICD-10-CM

## 2012-11-03 DIAGNOSIS — N2581 Secondary hyperparathyroidism of renal origin: Secondary | ICD-10-CM | POA: Diagnosis present

## 2012-11-03 DIAGNOSIS — E1142 Type 2 diabetes mellitus with diabetic polyneuropathy: Secondary | ICD-10-CM | POA: Diagnosis present

## 2012-11-03 DIAGNOSIS — N186 End stage renal disease: Secondary | ICD-10-CM

## 2012-11-03 DIAGNOSIS — T50995A Adverse effect of other drugs, medicaments and biological substances, initial encounter: Secondary | ICD-10-CM | POA: Diagnosis present

## 2012-11-03 DIAGNOSIS — R5381 Other malaise: Secondary | ICD-10-CM | POA: Diagnosis present

## 2012-11-03 DIAGNOSIS — M1712 Unilateral primary osteoarthritis, left knee: Secondary | ICD-10-CM

## 2012-11-03 DIAGNOSIS — M25569 Pain in unspecified knee: Secondary | ICD-10-CM | POA: Insufficient documentation

## 2012-11-03 DIAGNOSIS — Z87891 Personal history of nicotine dependence: Secondary | ICD-10-CM

## 2012-11-03 DIAGNOSIS — E1149 Type 2 diabetes mellitus with other diabetic neurological complication: Secondary | ICD-10-CM | POA: Diagnosis present

## 2012-11-03 DIAGNOSIS — D638 Anemia in other chronic diseases classified elsewhere: Secondary | ICD-10-CM

## 2012-11-03 DIAGNOSIS — M25562 Pain in left knee: Secondary | ICD-10-CM

## 2012-11-03 DIAGNOSIS — D631 Anemia in chronic kidney disease: Secondary | ICD-10-CM | POA: Diagnosis present

## 2012-11-03 DIAGNOSIS — I10 Essential (primary) hypertension: Secondary | ICD-10-CM | POA: Diagnosis present

## 2012-11-03 DIAGNOSIS — N039 Chronic nephritic syndrome with unspecified morphologic changes: Secondary | ICD-10-CM | POA: Diagnosis present

## 2012-11-03 DIAGNOSIS — E1122 Type 2 diabetes mellitus with diabetic chronic kidney disease: Secondary | ICD-10-CM | POA: Diagnosis present

## 2012-11-03 DIAGNOSIS — E1129 Type 2 diabetes mellitus with other diabetic kidney complication: Secondary | ICD-10-CM | POA: Diagnosis present

## 2012-11-03 DIAGNOSIS — Z992 Dependence on renal dialysis: Secondary | ICD-10-CM

## 2012-11-03 DIAGNOSIS — Z8673 Personal history of transient ischemic attack (TIA), and cerebral infarction without residual deficits: Secondary | ICD-10-CM

## 2012-11-03 DIAGNOSIS — I120 Hypertensive chronic kidney disease with stage 5 chronic kidney disease or end stage renal disease: Secondary | ICD-10-CM | POA: Diagnosis present

## 2012-11-03 DIAGNOSIS — Z7982 Long term (current) use of aspirin: Secondary | ICD-10-CM

## 2012-11-03 DIAGNOSIS — R55 Syncope and collapse: Secondary | ICD-10-CM

## 2012-11-03 DIAGNOSIS — Z79899 Other long term (current) drug therapy: Secondary | ICD-10-CM

## 2012-11-03 DIAGNOSIS — N189 Chronic kidney disease, unspecified: Secondary | ICD-10-CM | POA: Diagnosis present

## 2012-11-03 DIAGNOSIS — M1710 Unilateral primary osteoarthritis, unspecified knee: Secondary | ICD-10-CM | POA: Diagnosis present

## 2012-11-03 HISTORY — DX: Syncope and collapse: R55

## 2012-11-03 LAB — URINALYSIS, ROUTINE W REFLEX MICROSCOPIC
Bilirubin Urine: NEGATIVE
Glucose, UA: NEGATIVE mg/dL
Specific Gravity, Urine: 1.011 (ref 1.005–1.030)
Urobilinogen, UA: 0.2 mg/dL (ref 0.0–1.0)
pH: 8 (ref 5.0–8.0)

## 2012-11-03 LAB — CBC WITH DIFFERENTIAL/PLATELET
Eosinophils Absolute: 0.2 10*3/uL (ref 0.0–0.7)
Hemoglobin: 12.2 g/dL — ABNORMAL LOW (ref 13.0–17.0)
Lymphocytes Relative: 10 % — ABNORMAL LOW (ref 12–46)
Lymphs Abs: 1.1 10*3/uL (ref 0.7–4.0)
MCH: 28.6 pg (ref 26.0–34.0)
MCV: 82.4 fL (ref 78.0–100.0)
Monocytes Relative: 9 % (ref 3–12)
Neutrophils Relative %: 79 % — ABNORMAL HIGH (ref 43–77)
RBC: 4.26 MIL/uL (ref 4.22–5.81)
WBC: 10.5 10*3/uL (ref 4.0–10.5)

## 2012-11-03 LAB — TROPONIN I: Troponin I: <0.3 ng/mL (ref <0.30)

## 2012-11-03 LAB — BASIC METABOLIC PANEL
BUN: 70 mg/dL — ABNORMAL HIGH (ref 6–23)
CO2: 30 mEq/L (ref 19–32)
GFR calc non Af Amer: 4 mL/min — ABNORMAL LOW (ref >90)
Glucose, Bld: 124 mg/dL — ABNORMAL HIGH (ref 70–99)
Potassium: 4.4 mEq/L (ref 3.5–5.1)
Sodium: 142 mEq/L (ref 135–145)

## 2012-11-03 LAB — VITAMIN B12: Vitamin B-12: 651 pg/mL (ref 211–911)

## 2012-11-03 LAB — GLUCOSE, CAPILLARY: Glucose-Capillary: 102 mg/dL — ABNORMAL HIGH (ref 70–99)

## 2012-11-03 LAB — URINE MICROSCOPIC-ADD ON

## 2012-11-03 LAB — CREATININE, SERUM: Creatinine, Ser: 10.07 mg/dL — ABNORMAL HIGH (ref 0.50–1.35)

## 2012-11-03 LAB — CBC
MCH: 28.1 pg (ref 26.0–34.0)
MCHC: 34.4 g/dL (ref 30.0–36.0)
MCV: 81.7 fL (ref 78.0–100.0)
Platelets: 319 10*3/uL (ref 150–400)
RDW: 13.7 % (ref 11.5–15.5)

## 2012-11-03 MED ORDER — ONDANSETRON HCL 4 MG PO TABS: 4.0000 mg | ORAL_TABLET | Freq: Four times a day (QID) | ORAL | Status: DC | PRN

## 2012-11-03 MED ORDER — OXYCODONE HCL 5 MG PO TABS
5.0000 mg | ORAL_TABLET | Freq: Four times a day (QID) | ORAL | Status: DC | PRN
  Administered 2012-11-06 – 2012-11-08 (×2): 5 mg via ORAL
  Filled 2012-11-03 (×2): qty 1

## 2012-11-03 MED ORDER — SODIUM CHLORIDE 0.9 % IJ SOLN
3.0000 mL | Freq: Two times a day (BID) | INTRAMUSCULAR | Status: DC
  Administered 2012-11-03 – 2012-11-08 (×5): 3 mL via INTRAVENOUS

## 2012-11-03 MED ORDER — INSULIN ASPART 100 UNIT/ML ~~LOC~~ SOLN
0.0000 [IU] | Freq: Three times a day (TID) | SUBCUTANEOUS | Status: DC
  Administered 2012-11-06: 1 [IU] via SUBCUTANEOUS

## 2012-11-03 MED ORDER — LANTHANUM CARBONATE 500 MG PO CHEW
1000.0000 mg | CHEWABLE_TABLET | Freq: Three times a day (TID) | ORAL | Status: DC
  Administered 2012-11-03 – 2012-11-08 (×10): 1000 mg via ORAL
  Filled 2012-11-03 (×17): qty 2

## 2012-11-03 MED ORDER — ONDANSETRON HCL 4 MG/2ML IJ SOLN: 4.0000 mg | Freq: Four times a day (QID) | INTRAMUSCULAR | Status: DC | PRN

## 2012-11-03 MED ORDER — ASPIRIN 81 MG PO CHEW
81.0000 mg | CHEWABLE_TABLET | Freq: Every day | ORAL | Status: DC
  Administered 2012-11-04 – 2012-11-08 (×5): 81 mg via ORAL
  Filled 2012-11-03 (×5): qty 1

## 2012-11-03 MED ORDER — GABAPENTIN 100 MG PO CAPS
100.0000 mg | ORAL_CAPSULE | Freq: Every day | ORAL | Status: DC
  Administered 2012-11-03 – 2012-11-07 (×5): 100 mg via ORAL
  Filled 2012-11-03 (×6): qty 1

## 2012-11-03 MED ORDER — ASPIRIN-DIPYRIDAMOLE ER 25-200 MG PO CP12
1.0000 | ORAL_CAPSULE | Freq: Two times a day (BID) | ORAL | Status: DC
  Administered 2012-11-03 – 2012-11-08 (×9): 1 via ORAL
  Filled 2012-11-03 (×11): qty 1

## 2012-11-03 MED ORDER — RENA-VITE PO TABS
1.0000 | ORAL_TABLET | Freq: Every day | ORAL | Status: DC
  Administered 2012-11-03 – 2012-11-08 (×6): 1 via ORAL
  Filled 2012-11-03 (×6): qty 1

## 2012-11-03 MED ORDER — HEPARIN SODIUM (PORCINE) 5000 UNIT/ML IJ SOLN
5000.0000 [IU] | Freq: Three times a day (TID) | INTRAMUSCULAR | Status: DC
  Administered 2012-11-03 – 2012-11-08 (×12): 5000 [IU] via SUBCUTANEOUS
  Filled 2012-11-03 (×17): qty 1

## 2012-11-03 MED ORDER — ACETAMINOPHEN 325 MG PO TABS: 650.0000 mg | ORAL_TABLET | Freq: Four times a day (QID) | ORAL | Status: DC | PRN

## 2012-11-03 MED ORDER — CINACALCET HCL 30 MG PO TABS
30.0000 mg | ORAL_TABLET | Freq: Two times a day (BID) | ORAL | Status: DC
  Administered 2012-11-03 – 2012-11-08 (×9): 30 mg via ORAL
  Filled 2012-11-03 (×11): qty 1

## 2012-11-03 MED ORDER — OMEGA-3-ACID ETHYL ESTERS 1 G PO CAPS
1.0000 g | ORAL_CAPSULE | Freq: Two times a day (BID) | ORAL | Status: DC
  Administered 2012-11-03 – 2012-11-08 (×10): 1 g via ORAL
  Filled 2012-11-03 (×11): qty 1

## 2012-11-03 MED ORDER — ACETAMINOPHEN 650 MG RE SUPP: 650.0000 mg | Freq: Four times a day (QID) | RECTAL | Status: DC | PRN

## 2012-11-03 MED ORDER — DOXERCALCIFEROL 4 MCG/2ML IV SOLN
8.0000 ug | INTRAVENOUS | Status: DC
  Administered 2012-11-03 – 2012-11-08 (×3): 8 ug via INTRAVENOUS
  Filled 2012-11-03 (×3): qty 4

## 2012-11-03 MED ORDER — SODIUM CHLORIDE 0.9 % IV SOLN: INTRAVENOUS | Status: DC

## 2012-11-03 NOTE — Progress Notes (Signed)
Dr. Gwenlyn Perking notified of inability to complete orthostatics due to patient's inability to stand or sit straight up in bed. Colman Cater

## 2012-11-03 NOTE — Progress Notes (Signed)
Orthopedic Tech Progress Note Patient Details:  [name] 05-Nov-1930 [id number]  Ortho Devices Type of Ortho Device: Knee Sleeve Ortho Device/Splint Interventions: Application   Cammer, Mickie Bail 11/03/2012, 3:03 PM

## 2012-11-03 NOTE — Assessment & Plan Note (Signed)
Acute onset a few days ago with no trauma. No bruising.  Incomplete exam due to pain.  He'll f/u with ortho. No fx seen on xrays.  I don't think he can use crutches or bear weight.   He had to come in by EMS today.  rx written for WC with leg extension.

## 2012-11-03 NOTE — H&P (Signed)
[facility] History and Physical  [name] QMV:[id number] DOB: 12-27-1930 DOA: 11/03/2012  Referring physician: Dr. Ignacia Palma. PCP: Patrick Givens, MD  Specialists: Dr. Kathrene Bongo (renal service)  Chief Complaint: Syncope.  HPI: [name] is a 79 y.o. male with past medical history significant for hypertension, diabetes, end-stage renal disease on hemodialysis (Monday-Wednesday-Friday), degenerative joint disease, and history of stroke; came to the hospital secondary to near syncope/syncope event. , He reports that this morning when they were helping him get ready to go to his hemodialysis session patient suddenly experienced a brief syncopal episode at home; patient denies any chest pain, palpitations, diaphoresis, headache, blurry to, or any other prodromic symptoms to passing out. He in fact did no have any recollection of passing out. Family reports that event lasted approx 3 minutes; he regained consciousness and was completely appropriate since. Family denies any fecal incontinence, urine incontinence, seizure activity or postictal symptoms. In the ED no acute ischemic changes were seen on EKG, troponin negative x1, chest x-ray without acute infiltrates and a head CT without any acute intracranial abnormalities. [facility] has been called to me the patient for further evaluation and treatment. Of note patient has been recently placed on narcotics after a recent visit to the ED due to left knee pain; he in fact received a dose of this medication prior to whole event to occurred.  Review of Systems:  Negative except for a the is scheduled doses of chief was and he was in a in the in a in both in her a she has pain in his will or so a chief decision and 12,000 with a pulse. The rest of and rhythm else is a once who was a heavy in the otherwise mentioned on history of present illness.  Past Medical History  Diagnosis Date  . Hypertension   . Diabetes mellitus     typeII /  No meds  . Cataract 01/2002    Right  . Dialysis patient 10/13/2007  . Radiculopathy 05/14/2006    NCV study negative carpal tunnel, Left C5 radiculopathy  . Arthritis     Knee pain  . ESRD (end stage renal disease) on dialysis 2009    [facility] diaylsis Monday , Wed. and Friday per Dr. Kathrene Bongo  . Stroke   . Elevated PSA     h/o, pt had declined further eval.    Past Surgical History  Procedure Laterality Date  . Varicose vein surgery  1978  . External fixation wrist fracture  1990  . Thyroidectomy, partial  08/03/2003    Right thyroid lobectomy, subtotal parathyr. sec hyperparthyroidismadenosis  . Av fistula placement  07/2004    Left arm AV fistula placement [name] via [place])  . Eye surgery      Cataract   Social History:  reports that he quit smoking about 10 years ago. His smoking use included Cigarettes. He smoked 0.00 packs per day for 10 years. He has never used smokeless tobacco. He reports that he does not drink alcohol or use illicit drugs. patient lives at home with his wife receive assistance from his son. At this particular moment he has been on able to fully ambulate or perform his activities of daily living secondary to left knee pain and unstable gait. (Patient has required for the last 2 visits to the hemodialysis center to be transfer by EMT)   No Known Allergies  Family History  Problem Relation Age of Onset  . Cancer Father 75    Lung  Cancer//? stomach cancer  . Cancer Sister 63    brain tumor    Prior to Admission medications   Medication Sig Start Date End Date Taking? Authorizing Provider  aspirin 81 MG EC tablet Take 81 mg by mouth daily.     Yes Historical Provider, MD  cinacalcet (SENSIPAR) 30 MG tablet Take 30 mg by mouth 2 (two) times daily.    Yes Historical Provider, MD  dipyridamole-aspirin (AGGRENOX) 200-25 MG per 12 hr capsule Take 1 capsule by mouth 2 (two) times daily. 09/19/11  Yes Joaquim Nam, MD   HYDROcodone-acetaminophen (NORCO/VICODIN) 5-325 MG per tablet Take 1 tablet by mouth every 6 (six) hours as needed for pain. 11/02/12  Yes Joaquim Nam, MD  lanthanum (FOSRENOL) 1000 MG chewable tablet Chew 1,000 mg by mouth 3 (three) times daily with meals.     Yes Historical Provider, MD  multivitamin (RENA-VIT) TABS tablet Take 1 tablet by mouth daily.     Yes Historical Provider, MD  Omega-3 Fatty Acids (FISH OIL) 1000 MG CAPS Take 1 capsule by mouth daily.     Yes Historical Provider, MD   Physical Exam: Filed Vitals:   11/03/12 1230 11/03/12 1415 11/03/12 1500 11/03/12 1646  BP: 113/66 108/61 123/72 126/70  Pulse: 85   92  Temp:    97.7 F (36.5 C)  TempSrc:    Oral  Resp: 19 27 23 18   Weight:    89.359 kg (197 lb)  SpO2: 90%   95%     General:  Afebrile, no acute distress; alert, awake and oriented x3. Cooperative with examination  Eyes: PERRLA, no icterus, no nystagmus, extra ocular muscles intact.  ENT: Moist mucous membranes, no erythema or exudate inside his mouth; fair dentition. No discharge out of his nostrils or ears  Neck: Supple, no thyromegaly, no bruits  Cardiovascular: S1 and S2, no rubs, no gallops; heart rate within normal limits  Respiratory: Clear to auscultation bilaterally  Abdomen: Soft, nontender, nondistended, positive bowel sounds  Skin: No rash, no petechiae  Musculoskeletal: Decreased range of motion affecting especially his left knee (unable to bend or fully extend his knee w/o pain); mild effusion on physical exam; no warmth or erythema appreciated.   Psychiatric: Appropriate, and without suicidal ideation no hallucinations.  Neurologic: Alert, awake and oriented x3, cranial nerves grossly intact, no focal neurologic deficit appreciated except for numbness of his lower extremities (chronic and secondary to his diabetic neuropathy); muscle strength 4/5 bilaterally and symmetrically of his upper extremities and right lower extremity,  decreased strength of his left lower extremity secondary to discomfort for on his knee. Gait was not evaluated.  Labs on Admission:  Basic Metabolic Panel:  Recent Labs Lab 11/03/12 1230  NA 142  K 4.4  CL 98  CO2 30  GLUCOSE 124*  BUN 70*  CREATININE 9.79*  CALCIUM 9.7   CBC:  Recent Labs Lab 11/03/12 1230  WBC 10.5  NEUTROABS 8.3*  HGB 12.2*  HCT 35.1*  MCV 82.4  PLT 321   Radiological Exams on Admission: Dg Chest 2 View  11/03/2012  *RADIOLOGY REPORT*  Clinical Data: Syncope and weakness  CHEST - 2 VIEW  Comparison: 04/13/2012  Findings: Heart size is mildly enlarged.  There is no pleural effusion or edema.  No airspace consolidation.  Review of the visualized osseous structures is significant for mild thoracic spondylosis.  IMPRESSION:  1.  Cardiac enlargement. 2.  No acute findings.   Original Report Authenticated By: Signa Kell,  M.D.    Ct Head Wo Contrast  11/03/2012  *RADIOLOGY REPORT*  Clinical Data: Vertigo followed by syncope.  Left leg weakness.  CT HEAD WITHOUT CONTRAST  Technique:  Contiguous axial images were obtained from the base of the skull through the vertex without contrast.  Comparison: None.  Findings: There is a small focal area of low attenuation in the left frontal deep white matter (image 22).  No additional evidence of acute infarct, acute hemorrhage, mass lesion, mass effect or hydrocephalus.  Mild to moderate periventricular low attenuation. Probable dilated perivascular space in the right basal ganglia. Visualized portion of the paranasal sinuses and mastoid air cells are clear.  IMPRESSION:  1.  Focal area of low attenuation in the left frontal deep white matter may represent an area of remote ischemia.  Difficult to definitively exclude an acute or subacute infarct. 2.  Chronic microvascular white matter ischemic changes.   Original Report Authenticated By: Leanna Battles, M.D.     EKG:  Rate: 90  Rhythm: normal sinus rhythm  QRS Axis: left   Intervals: QT prolonged  ST/T Wave abnormalities: nonspecific ST changes--Poor Quality tracing makes interpretation difficult.  Conduction Disutrbances:right bundle branch block  Narrative Interpretation: No acute ischemic changes Old EKG Reviewed: unchanged   Assessment/Plan 1-Syncope/near syncope: Appears to be secondary to use of medications especially narcotics (newly prescribed for left knee pain). Other considerations includes TIA vs CVA (patient had hx of CVA); orthostatic changes and also vasovagal syncope. -Will admit to telemetry -Cycle cardiac enzymes -Minimize the use of pain medications -Will check orthostatic vital signs, will also check B12 level and TSH -Will ask physical therapy to evaluate and provide treatment as needed. -Check 2-D echo.  2-Diabetes mellitus with end stage renal disease: Patient has been placed on a low carbohydrate diet; will check hemoglobin A1c and use sliding scale insulin while he is in the hospital.  3-DIABETIC PERIPHERAL NEUROPATHY: Will start low-dose gabapentin to help with his discomfort. Will also check B12 and TSH to rule out any other etiology for neuropathy  4-HYPERTENSION: Is stable and fairly well controlled. At this moment volume is managed by hemodialysis. Will follow his vital signs   5-End stage renal failure on dialysis: Renal service has been contacted for continuation of hemodialysis while inpatient. Patient received hemodialysis on Monday-Wednesday-Friday.   6-Degenerative joint disease of knee, left: Degenerative changes seen on knee x-ray from 10/30/2012; will ask orthopedic service to see patient while he is in the hospital for any acute recommendations on his situation. There was no significant effusion appreciated on exam of his left knee; decreased range of motion and significant pain with palpation.   7-Anemia of chronic disease: Stable. Hemoglobin 12.5. IV item and he began to be determined as needed by renal  service.  8-History of CVA (cerebrovascular accident): CT scan of the head negative for any acute intracranial abnormality; will continue Aggrenox. No focal neurologic deficit on exam    9-secondary hyperparathyroidism: Continue Sensipar and Fosrenol  DVT: Heparin   Renal service has been consulted by ED for continuation of HD inside the hospital (M-W-F)  Code Status: Full Family Communication: Wife and son at bedside Disposition Plan: Admit to telemetry, observation status for further evaluation and treatment on his syncope; LOs < 2 midnights  Time spent: >30 minutes  [name] [facility] Pager [phone]  If 7PM-7AM, please contact night-coverage [email or web address] Password [facility] 11/03/2012, 4:54 PM

## 2012-11-03 NOTE — Consult Note (Signed)
[facility] Renal Consultation Note    Indication for Consultation:  Management of ESRD/hemodialysis; anemia, hypertension/volume and secondary hyperparathyroidism  HPI: [name] is a 79 y.o. male ESRD patient (MWF HD) with a PMH significant for HTN and DM who was brought to the ED via EMS after a brief syncopal episode at home, of which he has no recollection. Per family, the episode occurred while he was getting dressed for dialysis and lasted about 30 seconds. There were no acute findings on EKG. Troponin I is negative, and there were no acute findings on chest x-ray or head CT. He is afebrile and denies any pain, trauma, HA, dizziness, nausea, vomiting or speech impairment. Per notes, no obvious etiology of his syncopal has been identified. His only complaint today is left knee pain for which he was seen on 3/25 by his PCP Dr. Para March. He does have a history of DJD of the left knee. Ortho has placed a "knee sleeve" pending further work-up.  Last hemodialysis session was on Monday 11/01/12.  Past Medical History  Diagnosis Date  . Hypertension   . Diabetes mellitus     typeII / No meds  . Cataract 01/2002    Right  . Dialysis patient 10/13/2007  . Radiculopathy 05/14/2006    NCV study negative carpal tunnel, Left C5 radiculopathy  . Arthritis     Knee pain  . ESRD (end stage renal disease) on dialysis 2009    [facility] diaylsis Monday , Wed. and Friday per Dr. Kathrene Bongo  . Stroke   . Elevated PSA     h/o, pt had declined further eval.    Past Surgical History  Procedure Laterality Date  . Varicose vein surgery  1978  . External fixation wrist fracture  1990  . Thyroidectomy, partial  08/03/2003    Right thyroid lobectomy, subtotal parathyr. sec hyperparthyroidismadenosis  . Av fistula placement  07/2004    Left arm AV fistula placement [name] via [place])  . Eye surgery      Cataract   Family History  Problem Relation Age of Onset  .  Cancer Father 73    Lung Cancer//? stomach cancer  . Cancer Sister 79    brain tumor   Social History:  reports that he quit smoking about 10 years ago. His smoking use included Cigarettes. He smoked 0.00 packs per day for 10 years. He has never used smokeless tobacco. He reports that he does not drink alcohol or use illicit drugs. No Known Allergies Prior to Admission medications   Medication Sig Start Date End Date Taking? Authorizing Provider  aspirin 81 MG EC tablet Take 81 mg by mouth daily.     Yes Historical Provider, MD  cinacalcet (SENSIPAR) 30 MG tablet Take 30 mg by mouth 2 (two) times daily.    Yes Historical Provider, MD  dipyridamole-aspirin (AGGRENOX) 200-25 MG per 12 hr capsule Take 1 capsule by mouth 2 (two) times daily. 09/19/11  Yes Joaquim Nam, MD  HYDROcodone-acetaminophen (NORCO/VICODIN) 5-325 MG per tablet Take 1 tablet by mouth every 6 (six) hours as needed for pain. 11/02/12  Yes Joaquim Nam, MD  lanthanum (FOSRENOL) 1000 MG chewable tablet Chew 1,000 mg by mouth 3 (three) times daily with meals.     Yes Historical Provider, MD  multivitamin (RENA-VIT) TABS tablet Take 1 tablet by mouth daily.     Yes Historical Provider, MD  Omega-3 Fatty Acids (FISH OIL) 1000 MG CAPS Take 1 capsule by mouth daily.  Yes Historical Provider, MD   Current Facility-Administered Medications  Medication Dose Route Frequency Provider Last Rate Last Dose  . acetaminophen (TYLENOL) tablet 650 mg  650 mg Oral Q6H PRN Vassie Loll, MD       Or  . acetaminophen (TYLENOL) suppository 650 mg  650 mg Rectal Q6H PRN Vassie Loll, MD      . Melene Muller ON 11/04/2012] aspirin chewable tablet 81 mg  81 mg Oral Daily Vassie Loll, MD      . cinacalcet (SENSIPAR) tablet 30 mg  30 mg Oral BID Vassie Loll, MD      . dipyridamole-aspirin (AGGRENOX) 200-25 MG per 12 hr capsule 1 capsule  1 capsule Oral BID Vassie Loll, MD      . doxercalciferol (HECTOROL) injection 8 mcg  8 mcg Intravenous Q  M,W,F-HD Kerin Salen, PA-C      . gabapentin (NEURONTIN) capsule 100 mg  100 mg Oral QHS Vassie Loll, MD      . heparin injection 5,000 Units  5,000 Units Subcutaneous Q8H Vassie Loll, MD      . insulin aspart (novoLOG) injection 0-9 Units  0-9 Units Subcutaneous TID WC Vassie Loll, MD      . lanthanum [facility]) chewable tablet 1,000 mg  1,000 mg Oral TID WC Vassie Loll, MD      . multivitamin (RENA-VIT) tablet 1 tablet  1 tablet Oral QPC supper Vassie Loll, MD      . omega-3 acid ethyl esters (LOVAZA) capsule 1 g  1 g Oral BID Vassie Loll, MD      . ondansetron [facility]) tablet 4 mg  4 mg Oral Q6H PRN Vassie Loll, MD       Or  . ondansetron [facility] injection 4 mg  4 mg Intravenous Q6H PRN Vassie Loll, MD      . oxyCODONE (Oxy IR/ROXICODONE) immediate release tablet 5 mg  5 mg Oral Q6H PRN Vassie Loll, MD      . sodium chloride 0.9 % injection 3 mL  3 mL Intravenous Q12H Vassie Loll, MD       Labs: Basic Metabolic Panel:  Recent Labs Lab 11/03/12 1230  NA 142  K 4.4  CL 98  CO2 30  GLUCOSE 124*  BUN 70*  CREATININE 9.79*  CALCIUM 9.7   CBC:  Recent Labs Lab 11/03/12 1230  WBC 10.5  NEUTROABS 8.3*  HGB 12.2*  HCT 35.1*  MCV 82.4  PLT 321   Studies/Results: Dg Chest 2 View  11/03/2012  *RADIOLOGY REPORT*  Clinical Data: Syncope and weakness  CHEST - 2 VIEW  Comparison: 04/13/2012  Findings: Heart size is mildly enlarged.  There is no pleural effusion or edema.  No airspace consolidation.  Review of the visualized osseous structures is significant for mild thoracic spondylosis.  IMPRESSION:  1.  Cardiac enlargement. 2.  No acute findings.   Original Report Authenticated By: Signa Kell, M.D.    Ct Head Wo Contrast  11/03/2012  *RADIOLOGY REPORT*  Clinical Data: Vertigo followed by syncope.  Left leg weakness.  CT HEAD WITHOUT CONTRAST  Technique:  Contiguous axial images were obtained from the base of the skull through the vertex without contrast.   Comparison: None.  Findings: There is a small focal area of low attenuation in the left frontal deep white matter (image 22).  No additional evidence of acute infarct, acute hemorrhage, mass lesion, mass effect or hydrocephalus.  Mild to moderate periventricular low attenuation. Probable dilated perivascular space in the right basal  ganglia. Visualized portion of the paranasal sinuses and mastoid air cells are clear.  IMPRESSION:  1.  Focal area of low attenuation in the left frontal deep white matter may represent an area of remote ischemia.  Difficult to definitively exclude an acute or subacute infarct. 2.  Chronic microvascular white matter ischemic changes.   Original Report Authenticated By: Leanna Battles, M.D.     ROS: Left knee pain. 10-pt ROS asked and answered. All other systems negative except as noted in the HPI above.   Physical Exam: Filed Vitals:   11/03/12 1230 11/03/12 1415 11/03/12 1500 11/03/12 1646  BP: 113/66 108/61 123/72 126/70  Pulse: 85   92  Temp:    97.7 F (36.5 C)  TempSrc:    Oral  Resp: 19 27 23 18   Weight:    89.359 kg (197 lb)  SpO2: 90%   95%     General: Well developed, well nourished, in no acute distress. Head: Normocephalic, atraumatic, mildly icteric sclerae, mucus membranes are moist Neck: Supple. Pulsatile JVD Lungs: Clear bilaterally to auscultation without wheezes, rales, or rhonchi. Breathing is unlabored. Heart: RRR with S1 S2. No murmurs, rubs, or gallops appreciated. Abdomen: Soft, non-tender, non-distended with normoactive bowel sounds. No rebound/guarding. No obvious abdominal masses. M-S:  Strength and tone appear normal for age. Lower extremities:without edema or ischemic changes, no open wounds. Compression sleeve to left knee Neuro: Alert and oriented X 3. Moves all extremities spontaneously. Psych:  Responds to questions appropriately with a normal affect. Dialysis Access: LFA AVF with + bruit  Dialysis Orders: Center: [place]  on MWF  . EDW 92 kg HD Bath 2K/2Ca  Time 3:45 Heparin 5000 u. Access LFA AVF buttonhole BFR 500 DFR A1.5    Hectorol 8 mcg IV/HD Epogen 0   Units IV/HD  Venofer  0   Assessment/Plan: 1. Syncopal Episode - Troponin I negative. No acute findings on EKG. CT head with chronic microvascular white matter changes but could not exclude left frontal area of remote ischemia.  2. Left Knee Pain/ Hx DJD - Knee sleeve, per ortho. 3. ESRD -  MWF. Inpatient HD today. K+ 4.4 4. Hypertension/volume  - SBPs 100s-120s. No op BP meds. No pulm edema on CXR. Leaving about 1kg over EDW at outpatient center but is 3 kg under on  inpatient scales.  Try for UF 2-3L today given BP. 5. Anemia  - Hgb 12.2  No op epo. 6. Metabolic bone disease -  Ca 9.7 (9.5 corrected) P 3.8 on Fosrenol 1g TIDWC. PTH 239.9 on Sensipar 30 mg and Hectorol 8. (s/p subtotal parathyroidectomy) 7. Nutrition - Albumin 4.2 - high protein renal diet and multivitamin ordered. 8. DM - On insulin per admit  Claud Kelp, PA-C [facility] Pager [phone] 11/03/2012, 5:23 PM   Patient seen and examined.  Agree with assessment and plan as above. ESRD with unexplained syncopal event at home, while getting ready for dialysis. On dialysis now, check orthostatics after HD. Rec's as above. [name]  MD [phone] pgr    [phone] cell 11/03/2012, 6:53 PM

## 2012-11-03 NOTE — ED Provider Notes (Signed)
History     CSN: [id number]  Arrival date & time 11/03/12  1136   First MD Initiated Contact with Patient 11/03/12 1214      Chief Complaint  Patient presents with  . Loss of Consciousness    (Consider location/radiation/quality/duration/timing/severity/associated sxs/prior treatment) HPI Comments: Patient is an 79 year old male with a past medical history of ESRD, previous stroke, diabetes and hypertension who presents after a syncopal episode that occurred this morning. The history is provided by EMS who states family members witnessed the patient "fall back" and lost consciousness for about 30 seconds while he was getting dressed for dialysis. He spontaneously improved after about 30 seconds, per family. Patient denies any associated symptoms such as chest pain, dizziness, SOB, NVD surrounding the syncopal episode. No aggravating/alleviating factors. He denies any head trauma.    Past Medical History  Diagnosis Date  . Hypertension   . Diabetes mellitus     typeII / No meds  . Cataract 01/2002    Right  . Dialysis patient 10/13/2007  . Radiculopathy 05/14/2006    NCV study negative carpal tunnel, Left C5 radiculopathy  . Arthritis     Knee pain  . ESRD (end stage renal disease) on dialysis 2009    [facility] diaylsis Monday , Wed. and Friday per Dr. Kathrene Bongo  . Stroke   . Elevated PSA     h/o, pt had declined further eval.     Past Surgical History  Procedure Laterality Date  . Varicose vein surgery  1978  . External fixation wrist fracture  1990  . Thyroidectomy, partial  08/03/2003    Right thyroid lobectomy, subtotal parathyr. sec hyperparthyroidismadenosis  . Av fistula placement  07/2004    Left arm AV fistula placement [name] via [place])  . Eye surgery      Cataract    Family History  Problem Relation Age of Onset  . Cancer Father 30    Lung Cancer//? stomach cancer  . Cancer Sister 75    brain tumor    History  Substance Use Topics   . Smoking status: Former Smoker -- 10 years    Types: Cigarettes    Quit date: 04/01/2002  . Smokeless tobacco: Never Used     Comment: quit ove 20 years  . Alcohol Use: No      Review of Systems  Neurological: Positive for syncope.  All other systems reviewed and are negative.    Allergies  Review of patient's allergies indicates no known allergies.  Home Medications   Current Outpatient Rx  Name  Route  Sig  Dispense  Refill  . aspirin 81 MG EC tablet   Oral   Take 81 mg by mouth daily.           . cinacalcet (SENSIPAR) 30 MG tablet   Oral   Take 30 mg by mouth 2 (two) times daily.          [name] Kitchen dipyridamole-aspirin (AGGRENOX) 200-25 MG per 12 hr capsule   Oral   Take 1 capsule by mouth 2 (two) times daily.         [name] Kitchen HYDROcodone-acetaminophen (NORCO/VICODIN) 5-325 MG per tablet   Oral   Take 1 tablet by mouth every 6 (six) hours as needed for pain.   30 tablet   0   . lanthanum (FOSRENOL) 1000 MG chewable tablet   Oral   Chew 1,000 mg by mouth 3 (three) times daily with meals.           [name] Kitchen  multivitamin (RENA-VIT) TABS tablet   Oral   Take 1 tablet by mouth daily.           . Omega-3 Fatty Acids (FISH OIL) 1000 MG CAPS   Oral   Take 1 capsule by mouth daily.             BP 104/59  Pulse 90  Temp(Src) 97.5 F (36.4 C) (Oral)  Resp 20  SpO2 100%  Physical Exam  Nursing note and vitals reviewed. Constitutional: He is oriented to person, place, and time. He appears well-developed and well-nourished. No distress.  HENT:  Head: Normocephalic and atraumatic.  Eyes: Conjunctivae and EOM are normal.  Neck: Normal range of motion. Neck supple.  Cardiovascular: Normal rate and regular rhythm.  Exam reveals no gallop and no friction rub.   No murmur heard. Pulmonary/Chest: Effort normal and breath sounds normal. He has no wheezes. He has no rales. He exhibits no tenderness.  Abdominal: Soft. He exhibits no distension. There is no tenderness.  There is no rebound and no guarding.  Musculoskeletal: Normal range of motion.  Neurological: He is alert and oriented to person, place, and time. Coordination normal.  Speech is goal-oriented. Moves limbs without ataxia.   Skin: Skin is warm and dry.  Psychiatric: He has a normal mood and affect. His behavior is normal.    ED Course  Procedures (including critical care time)  Labs Reviewed  CBC WITH DIFFERENTIAL - Abnormal; Notable for the following:    Hemoglobin 12.2 (*)    HCT 35.1 (*)    Neutrophils Relative 79 (*)    Neutro Abs 8.3 (*)    Lymphocytes Relative 10 (*)    All other components within normal limits  BASIC METABOLIC PANEL - Abnormal; Notable for the following:    Glucose, Bld 124 (*)    BUN 70 (*)    Creatinine, Ser 9.79 (*)    GFR calc non Af Amer 4 (*)    GFR calc Af Amer 5 (*)    All other components within normal limits  URINALYSIS, ROUTINE W REFLEX MICROSCOPIC - Abnormal; Notable for the following:    Hgb urine dipstick SMALL (*)    Protein, ur 100 (*)    All other components within normal limits  URINE CULTURE  URINE MICROSCOPIC-ADD ON  POCT I-STAT TROPONIN I   Dg Chest 2 View  11/03/2012  *RADIOLOGY REPORT*  Clinical Data: Syncope and weakness  CHEST - 2 VIEW  Comparison: 04/13/2012  Findings: Heart size is mildly enlarged.  There is no pleural effusion or edema.  No airspace consolidation.  Review of the visualized osseous structures is significant for mild thoracic spondylosis.  IMPRESSION:  1.  Cardiac enlargement. 2.  No acute findings.   Original Report Authenticated By: Signa Kell, M.D.    Ct Head Wo Contrast  11/03/2012  *RADIOLOGY REPORT*  Clinical Data: Vertigo followed by syncope.  Left leg weakness.  CT HEAD WITHOUT CONTRAST  Technique:  Contiguous axial images were obtained from the base of the skull through the vertex without contrast.  Comparison: None.  Findings: There is a small focal area of low attenuation in the left frontal deep  white matter (image 22).  No additional evidence of acute infarct, acute hemorrhage, mass lesion, mass effect or hydrocephalus.  Mild to moderate periventricular low attenuation. Probable dilated perivascular space in the right basal ganglia. Visualized portion of the paranasal sinuses and mastoid air cells are clear.  IMPRESSION:  1.  Focal  area of low attenuation in the left frontal deep white matter may represent an area of remote ischemia.  Difficult to definitively exclude an acute or subacute infarct. 2.  Chronic microvascular white matter ischemic changes.   Original Report Authenticated By: Leanna Battles, M.D.      1. Syncope   2. End stage renal failure on dialysis   3. Degenerative joint disease of knee, left       MDM  12:17 PM Labs, urinalysis, chest xray and troponin and CT head pending.   Patient admitted for syncope.       Emilia Beck, PA-C 11/03/12 1611

## 2012-11-03 NOTE — ED Notes (Signed)
Patient transported to CT 

## 2012-11-03 NOTE — ED Notes (Signed)
Report given and nephrology at bedside. Will take pt up after they are finished.

## 2012-11-03 NOTE — Progress Notes (Signed)
Pt getting dialysis Tx. Dropped BP last 10 min of Tx and had bieif syncopal episode. Dr. Lowell Guitar at bedside and aware of this. Pt has no recollection of passing out and had not symptoms prior to doing this.

## 2012-11-03 NOTE — ED Notes (Addendum)
Pt from home. Became weak, dizzy, syncopal and was passed out about 30 sec witnessed by family. Dialysis pt Monday, Wednesday and Friday. BP 125/75. HR 70 RR 16 CBG 101. 12 lead unremarkable. Per EMS upon their arrival pt A&O and no complaints.

## 2012-11-03 NOTE — ED Provider Notes (Signed)
12:54 PM  Date: 11/03/2012  Rate: 90  Rhythm: normal sinus rhythm  QRS Axis: left  Intervals: QT prolonged  ST/T Wave abnormalities: nonspecific ST changes--Poor Quality tracing makes interpretation difficult.  Conduction Disutrbances:right bundle branch block  Narrative Interpretation: Abnormal EKG  Old EKG Reviewed: unchanged    Patrick Cooper III, MD 11/03/12 1259

## 2012-11-03 NOTE — ED Provider Notes (Signed)
Medical screening examination/treatment/procedure(s) were conducted as a shared visit with non-physician practitioner(s) and myself.  I personally evaluated the patient during the encounter 79 yo man on hemodialysis, suffered a syncopal episode at home. He woke up shortly. He denies chest pain, difficulty with speech, unilateral weakness. He missed his dialysis today. He also has pain in the left knee, and prior workup showed that he had severe DJD of his left knee. Lab workup showed renal failure. Troponin I negative. EKG non-acute. Chest x-ray and CT of head negative. No obvious reason for syncope found. Call to [facility] to admit pt. Call to Dr. Kathrene Bongo, nephrologist, to arrange for his hemodialysis.   Carleene Cooper III, MD 11/03/12 2125

## 2012-11-03 NOTE — Progress Notes (Signed)
79 yo man on hemodialysis, suffered a syncopal episode at home.  He woke up shortly.  He denies chest pain, difficulty with speech, unilateral weakness.  He missed his dialysis today.  He also has pain in the left knee, and prior workup showed that he had severe DJD of his left knee.  Lab workup showed renal failure.  Troponin I negative.  EKG non-acute.  Chest x-ray and CT of head negative.  No obvious reason for syncope found.  Call to [facility] to admit pt.  Call to Dr. Kathrene Bongo, nephrologist, to arrange for his hemodialysis.

## 2012-11-03 NOTE — ED Notes (Signed)
Return from xray

## 2012-11-04 ENCOUNTER — Encounter (HOSPITAL_COMMUNITY): Payer: Self-pay | Admitting: General Practice

## 2012-11-04 LAB — URINE CULTURE
Colony Count: NO GROWTH
Culture: NO GROWTH
Special Requests: NORMAL

## 2012-11-04 LAB — LIPID PANEL
HDL: 47 mg/dL (ref >39)
LDL Cholesterol: 70 mg/dL (ref 0–99)
Total CHOL/HDL Ratio: 2.8 RATIO
VLDL: 14 mg/dL (ref 0–40)

## 2012-11-04 LAB — TROPONIN I
Troponin I: <0.3 ng/mL (ref <0.30)
Troponin I: <0.3 ng/mL (ref <0.30)

## 2012-11-04 LAB — CBC
MCH: 27.9 pg (ref 26.0–34.0)
MCHC: 34.9 g/dL (ref 30.0–36.0)
Platelets: 293 10*3/uL (ref 150–400)
RBC: 4.09 MIL/uL — ABNORMAL LOW (ref 4.22–5.81)
RDW: 13.9 % (ref 11.5–15.5)

## 2012-11-04 LAB — GLUCOSE, CAPILLARY
Glucose-Capillary: 128 mg/dL — ABNORMAL HIGH (ref 70–99)
Glucose-Capillary: 102 mg/dL — ABNORMAL HIGH (ref 70–99)
Glucose-Capillary: 117 mg/dL — ABNORMAL HIGH (ref 70–99)

## 2012-11-04 LAB — BASIC METABOLIC PANEL
Calcium: 9.4 mg/dL (ref 8.4–10.5)
Creatinine, Ser: 5.85 mg/dL — ABNORMAL HIGH (ref 0.50–1.35)
GFR calc non Af Amer: 8 mL/min — ABNORMAL LOW (ref >90)
Glucose, Bld: 113 mg/dL — ABNORMAL HIGH (ref 70–99)
Sodium: 138 mEq/L (ref 135–145)

## 2012-11-04 MED ORDER — NEPRO/CARBSTEADY PO LIQD
237.0000 mL | ORAL | Status: DC
  Administered 2012-11-04 – 2012-11-07 (×3): 237 mL via ORAL
  Filled 2012-11-04 (×6): qty 237

## 2012-11-04 MED ORDER — SODIUM CHLORIDE 0.9 % IV BOLUS (SEPSIS)
1000.0000 mL | Freq: Once | INTRAVENOUS | Status: AC
  Administered 2012-11-04: 1000 mL via INTRAVENOUS

## 2012-11-04 MED ORDER — LIDOCAINE HCL 1 % IJ SOLN
10.0000 mL | Freq: Once | INTRAMUSCULAR | Status: DC
  Filled 2012-11-04 (×2): qty 10

## 2012-11-04 MED ORDER — LIDOCAINE HCL (PF) 1 % IJ SOLN
10.0000 mL | Freq: Once | INTRAMUSCULAR | Status: AC
  Administered 2012-11-04: 10 mL
  Filled 2012-11-04 (×2): qty 10

## 2012-11-04 MED ORDER — TRIAMCINOLONE ACETONIDE 40 MG/ML IJ SUSP
40.0000 mg | Freq: Once | INTRAMUSCULAR | Status: AC
  Administered 2012-11-04: 40 mg via INTRA_ARTICULAR
  Filled 2012-11-04 (×2): qty 1

## 2012-11-04 NOTE — Progress Notes (Signed)
Subjective: No complaints,   Objective Vital signs in last 24 hours: Filed Vitals:   11/04/12 0600 11/04/12 0603 11/04/12 0606 11/04/12 0609  BP: 95/58 105/52 88/46 103/54  Pulse: 96 110 110 11  Temp: 98.3 F (36.8 C)     TempSrc: Oral     Resp: 20 20    Height:      Weight: 86.365 kg (190 lb 6.4 oz)     SpO2: 100% 100% 96% 95%   Weight change:   Intake/Output Summary (Last 24 hours) at 11/04/12 1149 Last data filed at 11/04/12 0615  Gross per 24 hour  Intake    355 ml  Output   1994 ml  Net  -1639 ml   Labs: Basic Metabolic Panel:  Recent Labs Lab 11/03/12 1230 11/03/12 1711 11/04/12 0455  NA 142  --  138  K 4.4  --  3.9  CL 98  --  98  CO2 30  --  28  GLUCOSE 124*  --  113*  BUN 70*  --  34*  CREATININE 9.79* 10.07* 5.85*  CALCIUM 9.7  --  9.4   Liver Function Tests: No results found for this basename: AST, ALT, ALKPHOS, BILITOT, PROT, ALBUMIN,  in the last 168 hours No results found for this basename: LIPASE, AMYLASE,  in the last 168 hours No results found for this basename: AMMONIA,  in the last 168 hours CBC:  Recent Labs Lab 11/03/12 1230 11/03/12 1711 11/04/12 0455  WBC 10.5 10.1 8.5  NEUTROABS 8.3*  --   --   HGB 12.2* 11.4* 11.4*  HCT 35.1* 33.1* 32.7*  MCV 82.4 81.7 80.0  PLT 321 319 293   PT/INR: @LABRCNTIP (inr:5)   Scheduled Meds ) . aspirin  81 mg Oral Daily  . cinacalcet  30 mg Oral BID  . dipyridamole-aspirin  1 capsule Oral BID  . doxercalciferol  8 mcg Intravenous Q M,W,F-HD  . feeding supplement (NEPRO CARB STEADY)  237 mL Oral Q24H  . gabapentin  100 mg Oral QHS  . heparin  5,000 Units Subcutaneous Q8H  . insulin aspart  0-9 Units Subcutaneous TID WC  . lanthanum  1,000 mg Oral TID WC  . multivitamin  1 tablet Oral QPC supper  . omega-3 acid ethyl esters  1 g Oral BID  . sodium chloride  3 mL Intravenous Q12H    Physical Exam:  Blood pressure 103/54, pulse 11, temperature 98.3 F (36.8 C), temperature source Oral,  resp. rate 20, height 6\' 6"  (1.981 m), weight 86.365 kg (190 lb 6.4 oz), SpO2 95.00%.  General: no acute distress.  Neck: Supple. Pulsatile JVD  Lungs: clear bilat Heart: RRR with S1 S2. No murmurs, rubs, or gallops appreciated.  Abdomen: Soft, non-tender, non-distended with normoactive bowel sounds.  Ext: no edema or ischemic changes, no open wounds, compression sleeve to left knee  Neuro: Alert and oriented X 3. Moves all extremities spontaneously.  Dialysis Access: LFA AVF with + bruit   Dialysis Orders: Center: [place] on MWF .  EDW 92 kg HD Bath 2K/2Ca Time 3:45 Heparin 5000 u. Access LFA AVF buttonhole BFR 500 DFR A1.5  Hectorol 8 mcg IV/HD Epogen 0 Units IV/HD Venofer 0   Assessment/Plan:  1. Syncopal Episode - Troponin I negative. No acute findings on EKG. CT head with chronic microvascular white matter changes but could not exclude left frontal area of remote ischemia. +orthostatic changes, will give fluid bolus tonight, no fluid off with HD tomorrow, recheck orthostatics in  am 2. Left Knee Pain/ Hx DJD - Knee sleeve, per ortho. 3. ESRD - MWF. Inpatient HD today. K+ 4.4 4. Hypertension/volume - SBPs 100s-120s. No op BP meds. No pulm edema on CXR. Weight down, "losing weight" per pt, but BP low and prob is vol depleted now. See above, plan NS bolus tonight.  5. Anemia - Hgb 12.2 No op epo. 6. Metabolic bone disease - Ca 9.7 (9.5 corrected) P 3.8 on Fosrenol 1g TIDWC. PTH 239.9 on Sensipar 30 mg and Hectorol 8. (s/p subtotal parathyroidectomy) 7. Nutrition - Albumin 4.2 - high protein renal diet and multivitamin ordered. 8. DM - On insulin per admit   Patrick Moselle  MD [phone] pgr    [phone] cell 11/04/2012, 11:49 AM

## 2012-11-04 NOTE — Progress Notes (Signed)
  Echocardiogram 2D Echocardiogram has been performed.  [name] 11/04/2012, 11:02 AM 

## 2012-11-04 NOTE — Progress Notes (Signed)
Was unable to get pt's orthos post hemo d/t pt refusing and being extremely tired. However, was able to get pt's orthos this morning. Sanda Linger

## 2012-11-04 NOTE — Evaluation (Signed)
Physical Therapy Evaluation Patient Details Name: [name] MRN: [id number] DOB: 21-Jan-1931 Today's Date: 11/04/2012 Time: 4696-2952 PT Time Calculation (min): 31 min  PT Assessment / Plan / Recommendation Clinical Impression  pt admitted after syncopal episode.  On eval the primary limitation is due to L knee pain which significantly limits mobility.  Pt can benefit from PT to Maximize function.    PT Assessment  Patient needs continued PT services    Follow Up Recommendations  Home health PT;Supervision for mobility/OOB    Does the patient have the potential to tolerate intense rehabilitation      Barriers to Discharge None      Equipment Recommendations  None recommended by PT    Recommendations for Other Services     Frequency Min 3X/week    Precautions / Restrictions Precautions Precautions: Fall   Pertinent Vitals/Pain Pain not as bad as a 10/10      Mobility  Bed Mobility Bed Mobility: Supine to Sit;Sitting - Scoot to Edge of Bed;Sit to Supine Supine to Sit: 3: Mod assist;HOB flat Sitting - Scoot to Edge of Bed: 4: Min guard Sit to Supine: 3: Mod assist Details for Bed Mobility Assistance: pt moves very stiffly at trunk eventhough pain is only at L knee;  significant assist given to get LLE back into bed and pt repositioned. Transfers Transfers: Sit to Stand;Stand to Sit Sit to Stand: 3: Mod assist;With upper extremity assist;From bed;From elevated surface Stand to Sit: 3: Mod assist;With upper extremity assist;To elevated surface;To bed Details for Transfer Assistance: pt very tentative and stiff.  Needed significant lifting assist.  pt could then maintain stance without assist Ambulation/Gait Ambulation/Gait Assistance: 3: Mod assist Ambulation Distance (Feet): 2 Feet (side stepping uptoward HOB) Assistive device: Rolling walker Ambulation/Gait Assistance Details: Antalgic gait with rigid trunck to walk 2 feet up toward HOB Stairs: No    Exercises      PT Diagnosis: Difficulty walking;Acute pain;Generalized weakness  PT Problem List: Decreased strength;Decreased range of motion;Decreased mobility;Pain PT Treatment Interventions: DME instruction;Gait training;Functional mobility training;Therapeutic activities;Therapeutic exercise;Patient/family education   PT Goals Acute Rehab PT Goals PT Goal Formulation: With patient Time For Goal Achievement: 11/18/12 Potential to Achieve Goals: Good Pt will go Supine/Side to Sit: with supervision;with HOB 0 degrees PT Goal: Supine/Side to Sit - Progress: Goal set today Pt will go Sit to Stand: with supervision PT Goal: Sit to Stand - Progress: Goal set today Pt will Transfer Bed to Chair/Chair to Bed: with supervision PT Transfer Goal: Bed to Chair/Chair to Bed - Progress: Goal set today Pt will Ambulate: 16 - 50 feet;with supervision;with least restrictive assistive device PT Goal: Ambulate - Progress: Goal set today Pt will Go Up / Down Stairs: with min assist;with rail(s);3-5 stairs PT Goal: Up/Down Stairs - Progress: Goal set today  Visit Information  Last PT Received On: 11/04/12 Assistance Needed: +1    Subjective Data  Subjective: I've been having more trouble with things at home for the past 2 months, but this knee has been getting worse the past 2 weeks, but not like this. Patient Stated Goal: Figure out this pain so I can get home   Prior Functioning  Home Living Lives With: Spouse;Son Available Help at Discharge: Family;Available 24 hours/day Type of Home: House Home Access: Stairs to enter [employer] of Steps: 2 Entrance Stairs-Rails: Right;Left Home Layout: One level Bathroom Shower/Tub: Forensic scientist: Standard Home Adaptive Equipment: Bedside commode/3-in-1;Shower chair without back;Straight cane;Walker - rolling Prior Function Level  of Independence: Independent with assistive device(s) (but needing more assist lately) Able to  Take Stairs?: Yes Driving: Yes Communication Communication: No difficulties    Cognition  Cognition Overall Cognitive Status: Appears within functional limits for tasks assessed/performed Arousal/Alertness: Awake/alert Orientation Level: Appears intact for tasks assessed Behavior During Session: [facility] for tasks performed    Extremity/Trunk Assessment Left Upper Extremity Assessment LUE ROM/Strength/Tone Deficits: difficult to range knee through full ROM due to pain,  difficult to WB through L knee Right Lower Extremity Assessment RLE ROM/Strength/Tone: Within functional levels Left Lower Extremity Assessment LLE ROM/Strength/Tone: Deficits LLE ROM/Strength/Tone Deficits: difficult to range knee through full ROM due to pain,  difficult to WB through L knee Trunk Assessment Trunk Assessment: Normal   Balance Balance Balance Assessed: Yes Static Sitting Balance Static Sitting - Balance Support: Feet supported;No upper extremity supported Static Sitting - Level of Assistance: 5: Stand by assistance  End of Session PT - End of Session Activity Tolerance: Patient limited by pain Patient left: in bed;with call bell/phone within reach Nurse Communication: Mobility status  GP Functional Assessment Tool Used: clinical judgement Functional Limitation: Mobility: Walking and moving around Mobility: Walking and Moving Around Current Status ([id number]): At least 20 percent but less than 40 percent impaired, limited or restricted Mobility: Walking and Moving Around Goal Status [phone]): At least 1 percent but less than 20 percent impaired, limited or restricted   [name], [name] 11/04/2012, 3:47 PM 11/04/2012  [place] Bing, PT [phone] (pager)

## 2012-11-04 NOTE — Progress Notes (Signed)
Utilization review completed.  

## 2012-11-04 NOTE — Progress Notes (Signed)
INITIAL NUTRITION ASSESSMENT  DOCUMENTATION CODES Per approved criteria  -Not Applicable   INTERVENTION: 1. Nepro Shake po daily, each supplement provides 425 kcal and 19 grams protein.  2. RD will continue to follow    NUTRITION DIAGNOSIS: Inadequate oral intake related to poor appetite as evidenced by weight loss.   Goal: PO intake to meet >/=90% estimated nutrition needs.   Monitor:  PO intake, weight trends, labs, I/O's  Reason for Assessment: Malnutrition Screening Tool  79 y.o. male  Admitting Dx: Syncope  ASSESSMENT: Pt admitted after syncopal episode at home. Pt with hx of DM2, ESRD on HD on MWF schedule.  Weight hx shows weight loss over the last 7 months, 25 lbs. This is 13% body weight, severe weight loss.  Pt states that he has been losing weight since starting on HD over 3 years ago. More rapidly in the past several months. Intake is less, but unable to quantify. Continues to eat 2-3 meals per day. States the RD at the HD center has recommended increased protein intake or Nepro shakes, but pt has not done this. Is agreeable to Nepro daily.    Height: Ht Readings from Last 1 Encounters:  11/03/12 6\' 6"  (1.981 m)   Ht Readings from Last 3 Encounters:  11/03/12 6\' 6"  (1.981 m)  04/29/12 6\' 4"  (1.93 m)  04/09/12 6\' 4"  (1.93 m)     Weight: Wt Readings from Last 1 Encounters:  11/04/12 190 lb 6.4 oz (86.365 kg)    Ideal Body Weight: 202 lbs   % Ideal Body Weight: 94%  Wt Readings from Last 10 Encounters:  11/04/12 190 lb 6.4 oz (86.365 kg)  08/31/12 204 lb (92.534 kg)  04/29/12 209 lb (94.802 kg)  04/09/12 215 lb (97.523 kg)  04/09/12 215 lb (97.523 kg)  04/01/12 215 lb (97.523 kg)  02/03/12 215 lb (97.523 kg)  01/23/11 220 lb 6 oz (99.961 kg)  10/17/10 220 lb 8 oz (100.018 kg)  06/20/10 221 lb (100.245 kg)    Usual Body Weight: 215 lbs   % Usual Body Weight: 88%  BMI:  Body mass index is 22.01 kg/(m^2). WNL   Estimated Nutritional  Needs: Kcal: 2400-2600 Protein: 110-120 gm  Fluid: 1.2 L   Skin: intact   Diet Order: Renal  EDUCATION NEEDS: -No education needs identified at this time   Intake/Output Summary (Last 24 hours) at 11/04/12 0943 Last data filed at 11/04/12 0615  Gross per 24 hour  Intake    355 ml  Output   1994 ml  Net  -1639 ml    Last BM: PTA   Labs:   Recent Labs Lab 11/03/12 1230 11/03/12 1711 11/04/12 0455  NA 142  --  138  K 4.4  --  3.9  CL 98  --  98  CO2 30  --  28  BUN 70*  --  34*  CREATININE 9.79* 10.07* 5.85*  CALCIUM 9.7  --  9.4  GLUCOSE 124*  --  113*    CBG (last 3)   Recent Labs  11/03/12 1651 11/03/12 2239 11/04/12 0815  GLUCAP 102* 152* 128*    Scheduled Meds: . aspirin  81 mg Oral Daily  . cinacalcet  30 mg Oral BID  . dipyridamole-aspirin  1 capsule Oral BID  . doxercalciferol  8 mcg Intravenous Q M,W,F-HD  . gabapentin  100 mg Oral QHS  . heparin  5,000 Units Subcutaneous Q8H  . insulin aspart  0-9 Units Subcutaneous TID WC  .  lanthanum  1,000 mg Oral TID WC  . multivitamin  1 tablet Oral QPC supper  . omega-3 acid ethyl esters  1 g Oral BID  . sodium chloride  3 mL Intravenous Q12H    Continuous Infusions:   Past Medical History  Diagnosis Date  . Hypertension   . Diabetes mellitus     typeII / No meds  . Cataract 01/2002    Right  . Dialysis patient 10/13/2007  . Radiculopathy 05/14/2006    NCV study negative carpal tunnel, Left C5 radiculopathy  . Arthritis     Knee pain  . ESRD (end stage renal disease) on dialysis 2009    [facility] di[facility] Monday , Wed. and Friday per Dr. Kathrene Bongo  . Stroke   . Elevated PSA     h/o, pt had declined further eval.   . Syncope 11/02/2012    Past Surgical History  Procedure Laterality Date  . Varicose vein surgery  1978  . External fixation wrist fracture  1990  . Thyroidectomy, partial  08/03/2003    Right thyroid lobectomy, subtotal parathyr. sec  hyperparthyroidismadenosis  . Av fistula placement  07/2004    Left arm AV fistula placement [name] via [place])  . Eye surgery      Cataract    Clarene Duke RD, LDN Pager [phone] After Hours pager [phone]

## 2012-11-04 NOTE — Consult Note (Signed)
Reason for Consult:  Left knee pain Referring Physician: Dr. Orlan Leavens [name] is an 79 y.o. male.  HPI: 79 y/o male with left knee pain for the last two weeks.  No h/o knee injury, surgery or gout.  Pt reports sharp pain deep in th eknee that is moderate to severe.  Worse with walking and better with rest.  Pt has "borderline" diabetes and ESRD requiring dialysis.  No smoking.  Admitted currently for syncope.  Pt was to have see Dr. Charlann Boxer in clinic today for his knee pain.  He denies popping, clicking, locking or catching.  No giving way.  No swelling at the left knee.  Past Medical History  Diagnosis Date  . Hypertension   . Diabetes mellitus     typeII / No meds  . Cataract 01/2002    Right  . Dialysis patient 10/13/2007  . Radiculopathy 05/14/2006    NCV study negative carpal tunnel, Left C5 radiculopathy  . Arthritis     Knee pain  . ESRD (end stage renal disease) on dialysis 2009    [name] diaylsis Monday , Wed. and Friday per Dr. Kathrene Bongo  . Stroke   . Elevated PSA     h/o, pt had declined further eval.   . Syncope 11/02/2012    Past Surgical History  Procedure Laterality Date  . Varicose vein surgery  1978  . External fixation wrist fracture  1990  . Thyroidectomy, partial  08/03/2003    Right thyroid lobectomy, subtotal parathyr. sec hyperparthyroidismadenosis  . Av fistula placement  07/2004    Left arm AV fistula placement [name] via [place])  . Eye surgery      Cataract    Family History  Problem Relation Age of Onset  . Cancer Father 95    Lung Cancer//? stomach cancer  . Cancer Sister 34    brain tumor    Social History:  reports that he quit smoking about 10 years ago. His smoking use included Cigarettes. He smoked 0.00 packs per day for 10 years. He has never used smokeless tobacco. He reports that he does not drink alcohol or use illicit drugs.  Allergies: No Known Allergies  Medications: I have reviewed the patient's current  medications.  Results for orders placed during the hospital encounter of 11/03/12 (from the past 48 hour(s))  CBC WITH DIFFERENTIAL     Status: Abnormal   Collection Time    11/03/12 12:30 PM      Result Value Range   WBC 10.5  4.0 - 10.5 K/uL   RBC 4.26  4.22 - 5.81 MIL/uL   Hemoglobin 12.2 (*) 13.0 - 17.0 g/dL   HCT 16.1 (*) 09.6 - 04.5 %   MCV 82.4  78.0 - 100.0 fL   MCH 28.6  26.0 - 34.0 pg   MCHC 34.8  30.0 - 36.0 g/dL   RDW 40.9  81.1 - 91.4 %   Platelets 321  150 - 400 K/uL   Neutrophils Relative 79 (*) 43 - 77 %   Neutro Abs 8.3 (*) 1.7 - 7.7 K/uL   Lymphocytes Relative 10 (*) 12 - 46 %   Lymphs Abs 1.1  0.7 - 4.0 K/uL   Monocytes Relative 9  3 - 12 %   Monocytes Absolute 0.9  0.1 - 1.0 K/uL   Eosinophils Relative 2  0 - 5 %   Eosinophils Absolute 0.2  0.0 - 0.7 K/uL   Basophils Relative 0  0 - 1 %  Basophils Absolute 0.0  0.0 - 0.1 K/uL  BASIC METABOLIC PANEL     Status: Abnormal   Collection Time    11/03/12 12:30 PM      Result Value Range   Sodium 142  135 - 145 mEq/L   Potassium 4.4  3.5 - 5.1 mEq/L   Chloride 98  96 - 112 mEq/L   CO2 30  19 - 32 mEq/L   Glucose, Bld 124 (*) 70 - 99 mg/dL   BUN 70 (*) 6 - 23 mg/dL   Creatinine, Ser 4.09 (*) 0.50 - 1.35 mg/dL   Calcium 9.7  8.4 - 81.1 mg/dL   GFR calc non Af Amer 4 (*) >90 mL/min   GFR calc Af Amer 5 (*) >90 mL/min   Comment:            The eGFR has been calculated     using the CKD EPI equation.     This calculation has not been     validated in all clinical     situations.     eGFR's persistently     <90 mL/min signify     possible Chronic Kidney Disease.  POCT I-STAT TROPONIN I     Status: None   Collection Time    11/03/12 12:52 PM      Result Value Range   Troponin i, poc 0.00  0.00 - 0.08 ng/mL   Comment 3            Comment: Due to the release kinetics of cTnI,     a negative result within the first hours     of the onset of symptoms does not rule out     myocardial infarction with  certainty.     If myocardial infarction is still suspected,     repeat the test at appropriate intervals.  URINALYSIS, ROUTINE W REFLEX MICROSCOPIC     Status: Abnormal   Collection Time    11/03/12  2:03 PM      Result Value Range   Color, Urine YELLOW  YELLOW   APPearance CLEAR  CLEAR   Specific Gravity, Urine 1.011  1.005 - 1.030   pH 8.0  5.0 - 8.0   Glucose, UA NEGATIVE  NEGATIVE mg/dL   Hgb urine dipstick SMALL (*) NEGATIVE   Bilirubin Urine NEGATIVE  NEGATIVE   Ketones, ur NEGATIVE  NEGATIVE mg/dL   Protein, ur 914 (*) NEGATIVE mg/dL   Urobilinogen, UA 0.2  0.0 - 1.0 mg/dL   Nitrite NEGATIVE  NEGATIVE   Leukocytes, UA NEGATIVE  NEGATIVE  URINE CULTURE     Status: None   Collection Time    11/03/12  2:03 PM      Result Value Range   Specimen Description URINE, CLEAN CATCH     Special Requests none Normal     Culture  Setup Time 11/03/2012 14:32     Colony Count NO GROWTH     Culture NO GROWTH     Report Status 11/04/2012 FINAL    URINE MICROSCOPIC-ADD ON     Status: None   Collection Time    11/03/12  2:03 PM      Result Value Range   Squamous Epithelial / LPF RARE  RARE   WBC, UA 3-6  <3 WBC/hpf   RBC / HPF 0-2  <3 RBC/hpf  GLUCOSE, CAPILLARY     Status: Abnormal   Collection Time    11/03/12  4:51 PM  Result Value Range   Glucose-Capillary 102 (*) 70 - 99 mg/dL  TROPONIN I     Status: None   Collection Time    11/03/12  5:05 PM      Result Value Range   Troponin I <0.30  <0.30 ng/mL   Comment:            Due to the release kinetics of cTnI,     a negative result within the first hours     of the onset of symptoms does not rule out     myocardial infarction with certainty.     If myocardial infarction is still suspected,     repeat the test at appropriate intervals.  VITAMIN B12     Status: None   Collection Time    11/03/12  5:11 PM      Result Value Range   Vitamin B-12 651  211 - 911 pg/mL  TSH     Status: None   Collection Time    11/03/12   5:11 PM      Result Value Range   TSH 0.717  0.350 - 4.500 uIU/mL  CBC     Status: Abnormal   Collection Time    11/03/12  5:11 PM      Result Value Range   WBC 10.1  4.0 - 10.5 K/uL   RBC 4.05 (*) 4.22 - 5.81 MIL/uL   Hemoglobin 11.4 (*) 13.0 - 17.0 g/dL   HCT 96.0 (*) 45.4 - 09.8 %   MCV 81.7  78.0 - 100.0 fL   MCH 28.1  26.0 - 34.0 pg   MCHC 34.4  30.0 - 36.0 g/dL   RDW 11.9  14.7 - 82.9 %   Platelets 319  150 - 400 K/uL  CREATININE, SERUM     Status: Abnormal   Collection Time    11/03/12  5:11 PM      Result Value Range   Creatinine, Ser 10.07 (*) 0.50 - 1.35 mg/dL   GFR calc non Af Amer 4 (*) >90 mL/min   GFR calc Af Amer 5 (*) >90 mL/min   Comment:            The eGFR has been calculated     using the CKD EPI equation.     This calculation has not been     validated in all clinical     situations.     eGFR's persistently     <90 mL/min signify     possible Chronic Kidney Disease.  HEMOGLOBIN A1C     Status: None   Collection Time    11/03/12  5:11 PM      Result Value Range   Hemoglobin A1C 5.4  <5.7 %   Comment: (NOTE)                                                                               According to the ADA Clinical Practice Recommendations for 2011, when     HbA1c is used as a screening test:      >=6.5%   Diagnostic of Diabetes Mellitus               (if  abnormal result is confirmed)     5.7-6.4%   Increased risk of developing Diabetes Mellitus     References:Diagnosis and Classification of Diabetes Mellitus,Diabetes     Care,2011,34(Suppl 1):S62-S69 and Standards of Medical Care in             Diabetes - 2011,Diabetes Care,2011,34 (Suppl 1):S11-S61.   Mean Plasma Glucose 108  <117 mg/dL  HEPATITIS B SURFACE ANTIGEN     Status: None   Collection Time    11/03/12  5:58 PM      Result Value Range   Hepatitis B Surface Ag NEGATIVE  NEGATIVE  GLUCOSE, CAPILLARY     Status: Abnormal   Collection Time    11/03/12 10:39 PM      Result Value Range    Glucose-Capillary 152 (*) 70 - 99 mg/dL   Comment 1 Notify RN    TROPONIN I     Status: None   Collection Time    11/03/12 11:09 PM      Result Value Range   Troponin I <0.30  <0.30 ng/mL   Comment:            Due to the release kinetics of cTnI,     a negative result within the first hours     of the onset of symptoms does not rule out     myocardial infarction with certainty.     If myocardial infarction is still suspected,     repeat the test at appropriate intervals.  BASIC METABOLIC PANEL     Status: Abnormal   Collection Time    11/04/12  4:55 AM      Result Value Range   Sodium 138  135 - 145 mEq/L   Potassium 3.9  3.5 - 5.1 mEq/L   Chloride 98  96 - 112 mEq/L   CO2 28  19 - 32 mEq/L   Glucose, Bld 113 (*) 70 - 99 mg/dL   BUN 34 (*) 6 - 23 mg/dL   Comment: DELTA CHECK NOTED   Creatinine, Ser 5.85 (*) 0.50 - 1.35 mg/dL   Comment: DELTA CHECK NOTED   Calcium 9.4  8.4 - 10.5 mg/dL   GFR calc non Af Amer 8 (*) >90 mL/min   GFR calc Af Amer 9 (*) >90 mL/min   Comment:            The eGFR has been calculated     using the CKD EPI equation.     This calculation has not been     validated in all clinical     situations.     eGFR's persistently     <90 mL/min signify     possible Chronic Kidney Disease.  CBC     Status: Abnormal   Collection Time    11/04/12  4:55 AM      Result Value Range   WBC 8.5  4.0 - 10.5 K/uL   RBC 4.09 (*) 4.22 - 5.81 MIL/uL   Hemoglobin 11.4 (*) 13.0 - 17.0 g/dL   HCT 16.1 (*) 09.6 - 04.5 %   MCV 80.0  78.0 - 100.0 fL   MCH 27.9  26.0 - 34.0 pg   MCHC 34.9  30.0 - 36.0 g/dL   RDW 40.9  81.1 - 91.4 %   Platelets 293  150 - 400 K/uL  LIPID PANEL     Status: None   Collection Time    11/04/12  4:55 AM      Result Value  Range   Cholesterol 131  0 - 200 mg/dL   Triglycerides 70  <161 mg/dL   HDL 47  >09 mg/dL   Total CHOL/HDL Ratio 2.8     VLDL 14  0 - 40 mg/dL   LDL Cholesterol 70  0 - 99 mg/dL   Comment:            Total  Cholesterol/HDL:CHD Risk     Coronary Heart Disease Risk Table                         Men   Women      1/2 Average Risk   3.4   3.3      Average Risk       5.0   4.4      2 X Average Risk   9.6   7.1      3 X Average Risk  23.4   11.0                Use the calculated Patient Ratio     above and the CHD Risk Table     to determine the patient's CHD Risk.                ATP III CLASSIFICATION (LDL):      <100     mg/dL   Optimal      604-540  mg/dL   Near or Above                        Optimal      130-159  mg/dL   Borderline      981-191  mg/dL   High      >478     mg/dL   Very High  TROPONIN I     Status: None   Collection Time    11/04/12  4:57 AM      Result Value Range   Troponin I <0.30  <0.30 ng/mL   Comment:            Due to the release kinetics of cTnI,     a negative result within the first hours     of the onset of symptoms does not rule out     myocardial infarction with certainty.     If myocardial infarction is still suspected,     repeat the test at appropriate intervals.  GLUCOSE, CAPILLARY     Status: Abnormal   Collection Time    11/04/12  8:15 AM      Result Value Range   Glucose-Capillary 128 (*) 70 - 99 mg/dL  GLUCOSE, CAPILLARY     Status: Abnormal   Collection Time    11/04/12 11:25 AM      Result Value Range   Glucose-Capillary 102 (*) 70 - 99 mg/dL  GLUCOSE, CAPILLARY     Status: Abnormal   Collection Time    11/04/12  4:41 PM      Result Value Range   Glucose-Capillary 117 (*) 70 - 99 mg/dL    Dg Chest 2 View  2/95/6213  *RADIOLOGY REPORT*  Clinical Data: Syncope and weakness  CHEST - 2 VIEW  Comparison: 04/13/2012  Findings: Heart size is mildly enlarged.  There is no pleural effusion or edema.  No airspace consolidation.  Review of the visualized osseous structures is significant for mild thoracic spondylosis.  IMPRESSION:  1.  Cardiac enlargement. 2.  No  acute findings.   Original Report Authenticated By: Signa Kell, M.D.    Ct  Head Wo Contrast  11/03/2012  *RADIOLOGY REPORT*  Clinical Data: Vertigo followed by syncope.  Left leg weakness.  CT HEAD WITHOUT CONTRAST  Technique:  Contiguous axial images were obtained from the base of the skull through the vertex without contrast.  Comparison: None.  Findings: There is a small focal area of low attenuation in the left frontal deep white matter (image 22).  No additional evidence of acute infarct, acute hemorrhage, mass lesion, mass effect or hydrocephalus.  Mild to moderate periventricular low attenuation. Probable dilated perivascular space in the right basal ganglia. Visualized portion of the paranasal sinuses and mastoid air cells are clear.  IMPRESSION:  1.  Focal area of low attenuation in the left frontal deep white matter may represent an area of remote ischemia.  Difficult to definitively exclude an acute or subacute infarct. 2.  Chronic microvascular white matter ischemic changes.   Original Report Authenticated By: Leanna Battles, M.D.    Xray:  L knee films from last week show tricompartmental degenerative changes.  ROS:  No recent f/c/n/v/wt loss.   PE:  Blood pressure 126/63, pulse 78, temperature 98 F (36.7 C), temperature source Oral, resp. rate 20, height 6\' 6"  (1.981 m), weight 86.365 kg (190 lb 6.4 oz), SpO2 97.00%. wn wd male in nad.  A and O.  Mood and affect normal.  EOMI.  resp unlabored.  L knee without effusion or swelling.  NTTP at medial or lateral joint line.  5/5 strength at quad and hamstring.  Skin healthy and intact.  Brisk cap refill at toes.  Sens to LT intact at knee level.  No lymphadenopathy.  Assessment/Plan: L knee arthritis - I explained the nature of the diagnosis to the pt in detail. I've offered him a steroid injection as I believe it will be the quickest way to improve his sxs.  He understands the risks and benefits and would like to proceed.  After informed consent and sterile prep I injected 1 cc kenalog (40 mg) and 5 cc lidocaine 1%  into the knee joint.  He tolerated the procedure well, and there were no evident complications.  He should f/u with Dr. Charlann Boxer in clinic in 4-6 weeks.  Ortho signing off.  Please call with any questions.  [phone].  Toni Arthurs 11/04/2012, 8:39 PM

## 2012-11-04 NOTE — Progress Notes (Signed)
[facility] PROGRESS NOTE  Elton Heid WJX:[id number] DOB: [birth year] DOA: 11/03/2012 PCP: Crawford Givens, MD  Assessment/Plan: 1-Syncope/near syncope: Appears to be secondary to use of medications especially narcotics (newly prescribed for left knee pain). Other considerations includes TIA vs CVA (patient had hx of CVA); orthostatic changes and also vasovagal syncope.  -continue telemetry monitoring (so far no significant abnormalities seen) -Cardiac enzymes negative X3 -Will cont Minimizing the use of pain medications (especially narcotics) -Orthostatic changes positive this morning; volume and dry weight to be adjusted by renal service during HD. -Check cortisol -Will ask physical therapy to evaluate and provide treatment as needed.  -Follow 2-D echo.   2-Diabetes mellitus with end stage renal disease: Patient has been placed on a low carbohydrate diet. A1C 5.4  3-DIABETIC PERIPHERAL NEUROPATHY:  -B12 and TSH WNL. -Continue neurontin  4-HYPERTENSION: Is soft and during HD low. At this moment volume is managed by hemodialysis. Will required adjustment on his dry weight with positive orthostatic changes. -will check cortisol  5-End stage renal failure on dialysis: Renal service has been contacted for continuation of hemodialysis while inpatient. Patient received hemodialysis on Monday-Wednesday-Friday.   6-Degenerative joint disease of knee, left: Degenerative changes seen on knee x-ray from 10/30/2012; will ask orthopedic service to see patient while he is in the hospital for any acute recommendations on his situation. There was no significant effusion appreciated on exam of his left knee; decreased range of motion and significant pain with palpation.   7-Anemia of chronic disease: Stable. IV iron and epogen to be determined as needed by renal service.   8-History of CVA (cerebrovascular accident): CT scan of the head negative for any acute intracranial abnormality; will  continue Aggrenox. No focal neurologic deficit on exam   9-secondary hyperparathyroidism: Continue Sensipar and Fosrenol. Renal service following for medication adjustment  Code Status: Full Family Communication: no family at bedside Disposition Plan: to be determined base on exam from PT and ortho recommendations.  Consultants:  Renal service  PT  Ortho [facility] orthopedic)  Procedures:  2-D echo pending  Antibiotics:  none  HPI/Subjective: No focal neurologic deficit, afebrile and complaining of Left knee pain. Patient with mild orthostatic changes this morning (had HD yesterday); also with brief syncope at the end of HD session.   Objective: Filed Vitals:   11/04/12 0600 11/04/12 0603 11/04/12 0606 11/04/12 0609  BP: 95/58 105/52 88/46 103/54  Pulse: 96 110 110 11  Temp: 98.3 F (36.8 C)     TempSrc: Oral     Resp: 20 20    Height:      Weight: 86.365 kg (190 lb 6.4 oz)     SpO2: 100% 100% 96% 95%    Intake/Output Summary (Last 24 hours) at 11/04/12 1142 Last data filed at 11/04/12 0615  Gross per 24 hour  Intake    355 ml  Output   1994 ml  Net  -1639 ml   Filed Weights   11/03/12 1749 11/03/12 2151 11/04/12 0600  Weight: 89.6 kg (197 lb 8.5 oz) 87.8 kg (193 lb 9 oz) 86.365 kg (190 lb 6.4 oz)    Exam:   General:  AAOX3, NAD, reports pain on his left knee  Cardiovascular: mild tachycardia, no rubs or gallops  Respiratory: CTA  Abdomen: soft, NT, ND, positive BS  Musculoskeletal: left knee with decrease range of motion and severely tender; no earmth sensation on exam.  Data Reviewed: Basic Metabolic Panel:  Recent Labs Lab 11/03/12 1230 11/03/12 1711 11/04/12 0455  NA 142  --  138  K 4.4  --  3.9  CL 98  --  98  CO2 30  --  28  GLUCOSE 124*  --  113*  BUN 70*  --  34*  CREATININE 9.79* 10.07* 5.85*  CALCIUM 9.7  --  9.4   CBC:  Recent Labs Lab 11/03/12 1230 11/03/12 1711 11/04/12 0455  WBC 10.5 10.1 8.5  NEUTROABS 8.3*   --   --   HGB 12.2* 11.4* 11.4*  HCT 35.1* 33.1* 32.7*  MCV 82.4 81.7 80.0  PLT 321 319 293   Cardiac Enzymes:  Recent Labs Lab 11/03/12 1705 11/03/12 2309 11/04/12 0457  TROPONINI <0.30 <0.30 <0.30   BNP (last 3 results) No results found for this basename: PROBNP,  in the last 8760 hours CBG:  Recent Labs Lab 11/03/12 1651 11/03/12 2239 11/04/12 0815  GLUCAP 102* 152* 128*    No results found for this or any previous visit (from the past 240 hour(s)).   Studies: Dg Chest 2 View  11/03/2012  *RADIOLOGY REPORT*  Clinical Data: Syncope and weakness  CHEST - 2 VIEW  Comparison: 04/13/2012  Findings: Heart size is mildly enlarged.  There is no pleural effusion or edema.  No airspace consolidation.  Review of the visualized osseous structures is significant for mild thoracic spondylosis.  IMPRESSION:  1.  Cardiac enlargement. 2.  No acute findings.   Original Report Authenticated By: Signa Kell, M.D.    Ct Head Wo Contrast  11/03/2012  *RADIOLOGY REPORT*  Clinical Data: Vertigo followed by syncope.  Left leg weakness.  CT HEAD WITHOUT CONTRAST  Technique:  Contiguous axial images were obtained from the base of the skull through the vertex without contrast.  Comparison: None.  Findings: There is a small focal area of low attenuation in the left frontal deep white matter (image 22).  No additional evidence of acute infarct, acute hemorrhage, mass lesion, mass effect or hydrocephalus.  Mild to moderate periventricular low attenuation. Probable dilated perivascular space in the right basal ganglia. Visualized portion of the paranasal sinuses and mastoid air cells are clear.  IMPRESSION:  1.  Focal area of low attenuation in the left frontal deep white matter may represent an area of remote ischemia.  Difficult to definitively exclude an acute or subacute infarct. 2.  Chronic microvascular white matter ischemic changes.   Original Report Authenticated By: Leanna Battles, M.D.      Scheduled Meds: . aspirin  81 mg Oral Daily  . cinacalcet  30 mg Oral BID  . dipyridamole-aspirin  1 capsule Oral BID  . doxercalciferol  8 mcg Intravenous Q M,W,F-HD  . feeding supplement (NEPRO CARB STEADY)  237 mL Oral Q24H  . gabapentin  100 mg Oral QHS  . heparin  5,000 Units Subcutaneous Q8H  . insulin aspart  0-9 Units Subcutaneous TID WC  . lanthanum  1,000 mg Oral TID WC  . multivitamin  1 tablet Oral QPC supper  . omega-3 acid ethyl esters  1 g Oral BID  . sodium chloride  3 mL Intravenous Q12H   Continuous Infusions:   Principal Problem:   Syncope Active Problems:   Diabetes mellitus with end stage renal disease   DIABETIC PERIPHERAL NEUROPATHY   HYPERTENSION   End stage renal failure on dialysis   Degenerative joint disease of knee, left   Anemia of chronic disease   History of CVA (cerebrovascular accident)    Time spent: >30 minutes    Andrae Claunch  [facility] Pager [phone]. If 7PM-7AM, please contact night-coverage at [email or web address], password [facility] 11/04/2012, 11:42 AM  LOS: 1 day

## 2012-11-05 LAB — CBC
Hemoglobin: 11.1 g/dL — ABNORMAL LOW (ref 13.0–17.0)
MCH: 27.7 pg (ref 26.0–34.0)
MCV: 79.6 fL (ref 78.0–100.0)
RBC: 4.01 MIL/uL — ABNORMAL LOW (ref 4.22–5.81)
WBC: 8.3 10*3/uL (ref 4.0–10.5)

## 2012-11-05 LAB — BASIC METABOLIC PANEL
CO2: 26 mEq/L (ref 19–32)
Calcium: 9.7 mg/dL (ref 8.4–10.5)
Chloride: 96 mEq/L (ref 96–112)
Glucose, Bld: 145 mg/dL — ABNORMAL HIGH (ref 70–99)
Sodium: 137 mEq/L (ref 135–145)

## 2012-11-05 LAB — GLUCOSE, CAPILLARY
Glucose-Capillary: 114 mg/dL — ABNORMAL HIGH (ref 70–99)
Glucose-Capillary: 118 mg/dL — ABNORMAL HIGH (ref 70–99)

## 2012-11-05 MED ORDER — NEPRO/CARBSTEADY PO LIQD
237.0000 mL | ORAL | Status: DC | PRN
  Filled 2012-11-05: qty 237

## 2012-11-05 MED ORDER — SODIUM CHLORIDE 0.9 % IV SOLN: 100.0000 mL | INTRAVENOUS | Status: DC | PRN

## 2012-11-05 MED ORDER — LIDOCAINE-PRILOCAINE 2.5-2.5 % EX CREA: 1.0000 "application " | TOPICAL_CREAM | CUTANEOUS | Status: DC | PRN

## 2012-11-05 MED ORDER — LIDOCAINE HCL (PF) 1 % IJ SOLN
5.0000 mL | INTRAMUSCULAR | Status: DC | PRN
  Filled 2012-11-05: qty 5

## 2012-11-05 MED ORDER — ALTEPLASE 2 MG IJ SOLR: 2.0000 mg | Freq: Once | INTRAMUSCULAR | Status: DC | PRN

## 2012-11-05 MED ORDER — HEPARIN SODIUM (PORCINE) 1000 UNIT/ML DIALYSIS
1000.0000 [IU] | INTRAMUSCULAR | Status: DC | PRN
  Filled 2012-11-05: qty 1

## 2012-11-05 MED ORDER — HEPARIN SODIUM (PORCINE) 1000 UNIT/ML DIALYSIS
5000.0000 [IU] | Freq: Once | INTRAMUSCULAR | Status: DC
  Filled 2012-11-05: qty 5

## 2012-11-05 MED ORDER — PENTAFLUOROPROP-TETRAFLUOROETH EX AERO: 1.0000 "application " | INHALATION_SPRAY | CUTANEOUS | Status: DC | PRN

## 2012-11-05 NOTE — Progress Notes (Signed)
[name] Progress Note  Subjective:   Left knee feels better. No other complaints.  Objective Filed Vitals:   11/05/12 0800 11/05/12 0830 11/05/12 0900 11/05/12 0930  BP: 124/69 129/76 121/70 119/73  Pulse: 99 94 94 97  Temp:      TempSrc:      Resp: 18 20 23 19   Height:      Weight:      SpO2:       Physical Exam General: Alert, cooperative, NAD Heart: RRR Lungs: CTA bilaterally. No wheezes, rales, rhonchi noted Abdomen: Soft, non-tender, normal BS Extremities: +edema Dialysis Access: LFA AVF cannulated  Dialysis Orders: Center: [place] on MWF .  EDW 92 kg HD Bath 2K/2Ca Time 3:45 Heparin 5000 u. Access LFA AVF buttonhole BFR 500 DFR A1.5 Hectorol 8 mcg IV/HD Epogen 0 Units IV/HD Venofer 0  Assessment/Plan: 1. Syncopal Episode - Troponin I negative. No acute findings on EKG. CT head with chronic microvascular white matter changes but could not exclude left frontal area of remote ischemia. +Orthostatic changes, Rec'd fluid bolus last evening, no fluid off with HD today, recheck of orthostatics pending 2. Left Knee Pain/ Hx DJD - Steroid injection (Kenalog) on 3/27 per ortho.  3. ESRD - MWF.  K+ 4.6. Next HD 3/31 4. Hypertension/volume - SBPs 120s. No op BP meds. No pulm edema on CXR. Weight down, "losing weight" per pt, but BP low and prob is vol depleted now. 0 UF today. 5. Anemia - Hgb 11.1 No ESA's for now 6. Metabolic bone disease - Ca 9.7 (9.5 corrected) P 3.8 on Fosrenol 1g TIDWC. PTH 239.9 on Sensipar 30 mg and Hectorol 8. (s/p subtotal parathyroidectomy) Renal panel pending. 7. Nutrition - Albumin 4.2 - high protein renal diet and multivitamin  8. DM - On SSI per admit. Last A1c 5.5%. Op HD center will continue routinely monitoring BG s/p steroid injection.  Scot Jun. Thad Ranger [name] Pager [phone] 11/05/2012,9:47 AM  LOS: 2 days   Patient seen and examined.  Agree with assessment and plan as above. Patrick Moselle  MD [phone]  pgr    [phone] cell 11/05/2012, 10:46 AM   Additional Objective Labs: Basic Metabolic Panel:  Recent Labs Lab 11/03/12 1230 11/03/12 1711 11/04/12 0455 11/05/12 0430  NA 142  --  138 137  K 4.4  --  3.9 4.6  CL 98  --  98 96  CO2 30  --  28 26  GLUCOSE 124*  --  113* 145*  BUN 70*  --  34* 62*  CREATININE 9.79* 10.07* 5.85* 8.37*  CALCIUM 9.7  --  9.4 9.7   CBC:  Recent Labs Lab 11/03/12 1230 11/03/12 1711 11/04/12 0455 11/05/12 0430  WBC 10.5 10.1 8.5 8.3  NEUTROABS 8.3*  --   --   --   HGB 12.2* 11.4* 11.4* 11.1*  HCT 35.1* 33.1* 32.7* 31.9*  MCV 82.4 81.7 80.0 79.6  PLT 321 319 293 305   Blood Culture    Component Value Date/Time   SDES URINE, CLEAN CATCH 11/03/2012 1403   SPECREQUEST none Normal 11/03/2012 1403   CULT NO GROWTH 11/03/2012 1403   REPTSTATUS 11/04/2012 FINAL 11/03/2012 1403    Cardiac Enzymes:  Recent Labs Lab 11/03/12 1705 11/03/12 2309 11/04/12 0457  TROPONINI <0.30 <0.30 <0.30   CBG:  Recent Labs Lab 11/03/12 2239 11/04/12 0815 11/04/12 1125 11/04/12 1641 11/04/12 2115  GLUCAP 152* 128* 102* 117* 117*   Studies/Results: Dg Chest 2 View  11/03/2012  *RADIOLOGY REPORT*  Clinical Data: Syncope and weakness  CHEST - 2 VIEW  Comparison: 04/13/2012  Findings: Heart size is mildly enlarged.  There is no pleural effusion or edema.  No airspace consolidation.  Review of the visualized osseous structures is significant for mild thoracic spondylosis.  IMPRESSION:  1.  Cardiac enlargement. 2.  No acute findings.   Original Report Authenticated By: Signa Kell, M.D.    Ct Head Wo Contrast  11/03/2012  *RADIOLOGY REPORT*  Clinical Data: Vertigo followed by syncope.  Left leg weakness.  CT HEAD WITHOUT CONTRAST  Technique:  Contiguous axial images were obtained from the base of the skull through the vertex without contrast.  Comparison: None.  Findings: There is a small focal area of low attenuation in the left frontal deep white matter  (image 22).  No additional evidence of acute infarct, acute hemorrhage, mass lesion, mass effect or hydrocephalus.  Mild to moderate periventricular low attenuation. Probable dilated perivascular space in the right basal ganglia. Visualized portion of the paranasal sinuses and mastoid air cells are clear.  IMPRESSION:  1.  Focal area of low attenuation in the left frontal deep white matter may represent an area of remote ischemia.  Difficult to definitively exclude an acute or subacute infarct. 2.  Chronic microvascular white matter ischemic changes.   Original Report Authenticated By: Leanna Battles, M.D.    Medications:   . aspirin  81 mg Oral Daily  . cinacalcet  30 mg Oral BID  . dipyridamole-aspirin  1 capsule Oral BID  . doxercalciferol  8 mcg Intravenous Q M,W,F-HD  . feeding supplement (NEPRO CARB STEADY)  237 mL Oral Q24H  . gabapentin  100 mg Oral QHS  . heparin  5,000 Units Subcutaneous Q8H  . [START ON 11/06/2012] heparin  5,000 Units Dialysis Once in dialysis  . insulin aspart  0-9 Units Subcutaneous TID WC  . lanthanum  1,000 mg Oral TID WC  . multivitamin  1 tablet Oral QPC supper  . omega-3 acid ethyl esters  1 g Oral BID  . sodium chloride  3 mL Intravenous Q12H

## 2012-11-05 NOTE — Progress Notes (Signed)
Clinical Social Work Department BRIEF PSYCHOSOCIAL ASSESSMENT 11/05/2012  Patient:  [name], [name]     Account Number:  [id number]     Admit date:  11/03/2012  Clinical Social Worker:  Kirke Shaggy  Date/Time:  11/05/2012 04:38 PM  Referred by:  RN  Date Referred:  11/05/2012 Referred for  SNF Placement   Other Referral:   Interview type:  Family Other interview type:   CSW spoke with wife [name] and son regarding SNF placement. Family feels that they cannot pick the pt up anymore when he falls. Wife is the primary care taker and stated that she isn't sure that she can care for her husband right now.    PSYCHOSOCIAL DATA Living Status:  WIFE Admitted from facility:   Level of care:   Primary support name:  [name] Primary support relationship to patient:  SPOUSE Degree of support available:   good    CURRENT CONCERNS Current Concerns  Post-Acute Placement   Other Concerns:    SOCIAL WORK ASSESSMENT / PLAN Met with pt at beside, pt was under the effects of medication and kept falling asleep. CSW met with spouse and pt's son. We talked about SNF placement so that the pt could get stronger, and then go back home.   Assessment/plan status:  Information/Referral to [facility] Other assessment/ plan:   CSW Connye Burkitt gave pt the list of facilities in [place].   Information/referral to community resources:    PATIENT'S/FAMILY'S RESPONSE TO PLAN OF CARE: Pt was awake when CSW first entered the room, he was in agreement for SNF placement. Wife and son are in agreement for SNF placement. Wife and Son hopes that [facility] is available first. The second choice is [facility].   Sherald Barge, LCSW-A Clinical Social Worker [phone]

## 2012-11-05 NOTE — Progress Notes (Deleted)
Utilization review completed.  

## 2012-11-05 NOTE — Progress Notes (Signed)
CSW received referral today for SNF placement.  Family was given a list of SNF's in the [place] area.  Will continue to follow pt's needs.   Sherald Barge, LCSW-A Clinical Social Worker [phone]

## 2012-11-05 NOTE — Progress Notes (Signed)
FL2 with passar was completed and sent out to [facility]. Gave handoff to weekend CSW.  Sherald Barge, LCSW-A Clinical Social Worker [phone]

## 2012-11-05 NOTE — Progress Notes (Signed)
Pt needing to use BSC, unable to perform Orthostatic BP at this time due to urgency and limited mobility. Pt BP 116/54 HR 117 while sitting on BSC. Pt only able to take few steps from bed to bedside commode. Unable to take step at all when getting off bedside commode due to pain. Had conversation with family about need for SNF at d/c. Pt very apprehensive, states somebody can pick me up when I need to move, smiling. Encourage pt to talk with family about what was safest for him and his family. Pt decided SNF would be ok at this time. Social worker aware. [employer] [place]

## 2012-11-05 NOTE — Care Management Note (Signed)
    Page 1 of 1   11/05/2012     4:20:03 PM   CARE MANAGEMENT NOTE 11/05/2012  Patient:  [name], [name]   Account Number:  [id number]  Date Initiated:  11/05/2012  Documentation initiated by:  Donn Pierini  Subjective/Objective Assessment:   Pt admitted with syncope     Action/Plan:   PTA pt lived at home with spouse- HD pt - independent- PT eval   Anticipated DC Date:  11/06/2012   Anticipated DC Plan:  SKILLED NURSING FACILITY  In-house referral  Clinical Social Worker      DC Planning Services  CM consult      Choice offered to / List presented to:             Status of service:  In process, will continue to follow Medicare Important Message given?   (If response is "NO", the following Medicare IM given date fields will be blank) Date Medicare IM given:   Date Additional Medicare IM given:    Discharge Disposition:    Per UR Regulation:  Reviewed for med. necessity/level of care/duration of stay  If discussed at Long Length of Stay Meetings, dates discussed:    Comments:  11/05/12- 1430- Donn Pierini RN, BSN [phone] Spoke with pt, son, and wife at bedside- per conversation pt was active PTA even driving himself to HD before his knee started given him trouble. Pt agrees that ST-SNF for rehab is the safest option for discharge and is agreeable - per son and wife they feel like at this point they just couldn't provide the care pt needs at home given his limited mobility. list of facilities left with pt/family- they state that they are most interested in [facility]- will consult CSW for possible placement -

## 2012-11-05 NOTE — Procedures (Signed)
I was present at this dialysis session. I have reviewed the session itself and made appropriate changes.   [name], MD [employer] 11/05/2012, 8:49 AM

## 2012-11-05 NOTE — Progress Notes (Signed)
[facility] PROGRESS NOTE  Jamel Holzmann ZOX:[id number] DOB: 1930/08/17 DOA: 11/03/2012 PCP: Crawford Givens, MD  Assessment/Plan: 1-Syncope/near syncope: Appears to be secondary to use of medications especially narcotics (newly prescribed for left knee pain). Other considerations includes TIA vs CVA (patient had hx of CVA in the past); orthostatic changes and also vasovagal syncope.  -No abnormalities seen on telemetry or EKG -Cardiac enzymes negative X3 -Will cont Minimizing the use of pain medications (especially narcotics) -Orthostatic changes negative today after HD. -Cortisol pending -2-D echo with just grade 1 diastolic dysfunction, preserved EF and no wall motion abnormalities. -Continue PT evaluation.  2-Diabetes mellitus with end stage renal disease: Patient has been placed on a low carbohydrate diet. A1C 5.4  3-DIABETIC PERIPHERAL NEUROPATHY:  -B12 and TSH WNL. -Continue neurontin daily  4-HYPERTENSION: has remain stable, but soft. No further lightheadedness or near syncope event. Renal to continue adjusting volume and dry weight.  5-End stage renal failure on dialysis: per renal, continue HD Monday-Wednesday-Friday.   6-Degenerative joint disease of knee, left: Degenerative changes seen on knee x-ray from 10/30/2012. Pain is better. S/P knee injection on 3/27; might need eventually knee replacement. For now due to degree of deconditioning and dismobility will try to place him if possible to SNF for rehab. If not will arrange [facility] services and provide DME's to facilitate care at home.  7-Anemia of chronic disease: Stable. IV iron and epogen to be determined as needed by renal service.   8-History of CVA (cerebrovascular accident): CT scan of the head negative for any acute intracranial abnormality; will continue Aggrenox for secondary prevention. No focal neurologic deficit on exam at this point.  9-secondary hyperparathyroidism: Continue Sensipar and Fosrenol. Renal  service following for medication adjustment  Code Status: Full Family Communication: no family at bedside Disposition Plan: to be determined base on exam from PT and ability to place him.  Consultants:  Renal service  PT  Ortho [facility] orthopedic)  Procedures:  2-D echo pending  Antibiotics:  none  HPI/Subjective: No focal neurologic deficit, afebrile and reporting improvement in his left knee pain. S/P knee injection yesterday. Patient still very deconditioning and requiring 2+ assistance just for transferring OOB.  Objective: Filed Vitals:   11/05/12 1030 11/05/12 1040 11/05/12 1049 11/05/12 1316  BP: 131/61 132/71 125/70 116/54  Pulse: 99 94 91 117  Temp:   98.3 F (36.8 C) 97.9 F (36.6 C)  TempSrc:   Oral Oral  Resp: 14 21 23 20   Height:      Weight:   89.1 kg (196 lb 6.9 oz)   SpO2:   97% 98%    Intake/Output Summary (Last 24 hours) at 11/05/12 1511 Last data filed at 11/05/12 1318  Gross per 24 hour  Intake    360 ml  Output    201 ml  Net    159 ml   Filed Weights   11/05/12 0500 11/05/12 0645 11/05/12 1049  Weight: 89.313 kg (196 lb 14.4 oz) 89.3 kg (196 lb 13.9 oz) 89.1 kg (196 lb 6.9 oz)    Exam:   General:  AAOX3, NAD, reports pain on his left knee  Cardiovascular: mild tachycardia, no rubs or gallops  Respiratory: CTA  Abdomen: soft, NT, ND, positive BS  Musculoskeletal: left knee with decrease range of motion and tenderness to palpation (better than yesterday); no warmth sensation on exam.  Data Reviewed: Basic Metabolic Panel:  Recent Labs Lab 11/03/12 1230 11/03/12 1711 11/04/12 0455 11/05/12 0430  NA 142  --  138 137  K 4.4  --  3.9 4.6  CL 98  --  98 96  CO2 30  --  28 26  GLUCOSE 124*  --  113* 145*  BUN 70*  --  34* 62*  CREATININE 9.79* 10.07* 5.85* 8.37*  CALCIUM 9.7  --  9.4 9.7   CBC:  Recent Labs Lab 11/03/12 1230 11/03/12 1711 11/04/12 0455 11/05/12 0430  WBC 10.5 10.1 8.5 8.3  NEUTROABS 8.3*  --    --   --   HGB 12.2* 11.4* 11.4* 11.1*  HCT 35.1* 33.1* 32.7* 31.9*  MCV 82.4 81.7 80.0 79.6  PLT 321 319 293 305   Cardiac Enzymes:  Recent Labs Lab 11/03/12 1705 11/03/12 2309 11/04/12 0457  TROPONINI <0.30 <0.30 <0.30   BNP (last 3 results) No results found for this basename: PROBNP,  in the last 8760 hours CBG:  Recent Labs Lab 11/04/12 0815 11/04/12 1125 11/04/12 1641 11/04/12 2115 11/05/12 1117  GLUCAP 128* 102* 117* 117* 114*    Recent Results (from the past 240 hour(s))  URINE CULTURE     Status: None   Collection Time    11/03/12  2:03 PM      Result Value Range Status   Specimen Description URINE, CLEAN CATCH   Final   Special Requests none Normal   Final   Culture  Setup Time 11/03/2012 14:32   Final   [place]ony Count NO GROWTH   Final   Culture NO GROWTH   Final   Report Status 11/04/2012 FINAL   Final     Studies: No results found.  Scheduled Meds: . aspirin  81 mg Oral Daily  . cinacalcet  30 mg Oral BID  . dipyridamole-aspirin  1 capsule Oral BID  . doxercalciferol  8 mcg Intravenous Q M,W,F-HD  . feeding supplement (NEPRO CARB STEADY)  237 mL Oral Q24H  . gabapentin  100 mg Oral QHS  . heparin  5,000 Units Subcutaneous Q8H  . insulin aspart  0-9 Units Subcutaneous TID WC  . lanthanum  1,000 mg Oral TID WC  . multivitamin  1 tablet Oral QPC supper  . omega-3 acid ethyl esters  1 g Oral BID  . sodium chloride  3 mL Intravenous Q12H   Continuous Infusions:   Principal Problem:   Syncope Active Problems:   Diabetes mellitus with end stage renal disease   DIABETIC PERIPHERAL NEUROPATHY   HYPERTENSION   End stage renal failure on dialysis   Degenerative joint disease of knee, left   Anemia of chronic disease   History of CVA (cerebrovascular accident)    Time spent: >30 minutes    Jacora Hopkins  [facility] Pager [phone]. If 7PM-7AM, please contact night-coverage at [email or web address], password [facility] 11/05/2012, 3:11 PM  LOS: 2  days

## 2012-11-05 NOTE — Progress Notes (Signed)
Physical Therapy Treatment Patient Details Name: [name] MRN: [id number] DOB: 12-15-1930 Today's Date: 11/05/2012 Time: 1530-1601 PT Time Calculation (min): 31 min  PT Assessment / Plan / Recommendation Comments on Treatment Session  Pt with recent decline in mobility due to progressive Lt knee pain. Now s/p steroid injection by ortho and tolerating movement better, however requires +2 people for transfers. Pt son and wife present throughout session and agree pt cannot come home at this current level.    Follow Up Recommendations  SNF;Supervision/Assistance - 24 hour     Does the patient have the potential to tolerate intense rehabilitation     Barriers to Discharge        Equipment Recommendations  None recommended by PT    Recommendations for Other Services    Frequency Min 3X/week   Plan Discharge plan needs to be updated;Frequency remains appropriate    Precautions / Restrictions Precautions Precautions: Fall   Pertinent Vitals/Pain "a bit less than 10/10" Lt knee after ROM exercises    Mobility  Bed Mobility Bed Mobility: Sit to Supine;Scooting to HOB Sit to Supine: 3: Mod assist Scooting to HOB: 1: +2 Total assist Scooting to [name] ([place]): Patient Percentage: 40% Details for Bed Mobility Assistance: pt able to manage his upper body/head as returning to supine; attempts to use UEs to pull LLE into bed, however is unable to without assistance Transfers Transfers: Sit to Stand;Stand to Sit Sit to Stand: 1: +2 Total assist;With armrests;From chair/3-in-1 Sit to Stand: Patient Percentage: 50% Stand to Sit: 4: Min assist Details for Transfer Assistance: prior to attempting standing, had pt do LLE exercises followed by seated reaching forward/hip flexion to decr stiffness through trunk (pt with posterior bias in sitting--initially resisting coming forward over his base of support) Ambulation/Gait Ambulation/Gait Assistance: 1: +2 Total assist Ambulation/Gait: Patient  Percentage: 70% Ambulation Distance (Feet): 2 Feet Assistive device: Rolling walker Ambulation/Gait Assistance Details: vc for sequencing and to maximize use of UEs to off-load LLE (limited due to pain); pivoted chair to bed Gait Pattern: Step-to pattern;Decreased stride length;Decreased hip/knee flexion - left;Antalgic;Trunk flexed    Exercises General Exercises - Lower Extremity Ankle Circles/Pumps: AROM;Left;10 reps Quad Sets: AROM;10 reps Heel Slides: AAROM;Left;10 reps;Supine Other Exercises Other Exercises: seated Lt knee flexion at EOB with towel under foot x 10 AROM   PT Diagnosis:    PT Problem List:   PT Treatment Interventions:     PT Goals Acute Rehab PT Goals Pt will go Sit to Stand: with supervision PT Goal: Sit to Stand - Progress: Progressing toward goal Pt will Transfer Bed to Chair/Chair to Bed: with supervision PT Transfer Goal: Bed to Chair/Chair to Bed - Progress: Progressing toward goal Pt will Ambulate: 16 - 50 feet;with supervision;with least restrictive assistive device PT Goal: Ambulate - Progress: Progressing toward goal  Visit Information  Last PT Received On: 11/05/12 Assistance Needed: +2    Subjective Data  Subjective: Reports the knee felt better for several hours after the shot (steroid), but has been hurting again today   Cognition  Cognition Overall Cognitive Status: History of cognitive impairments - at baseline Arousal/Alertness: Awake/alert Orientation Level: Appears intact for tasks assessed Behavior During Session: [facility] for tasks performed    Balance  Balance Balance Assessed: Yes Static Standing Balance Static Standing - Balance Support: Bilateral upper extremity supported Static Standing - Level of Assistance: 3: Mod assist Static Standing - Comment/# of Minutes: pt initially flexed at trunk, hips, knees--able to achieve full upright position with near  equal wt-bearing on both legs prior to initiating transfer  End of Session PT  - End of Session Equipment Utilized During Treatment: Gait belt Activity Tolerance: Patient limited by fatigue;Patient limited by pain Patient left: in bed;with call bell/phone within reach;with family/visitor present   GP     [name] 11/05/2012, 4:19 PM  11/05/2012 Veda Canning, PT Pager: [phone]

## 2012-11-06 LAB — GLUCOSE, CAPILLARY
Glucose-Capillary: 114 mg/dL — ABNORMAL HIGH (ref 70–99)
Glucose-Capillary: 110 mg/dL — ABNORMAL HIGH (ref 70–99)

## 2012-11-06 NOTE — Progress Notes (Signed)
[facility] PROGRESS NOTE  [name] AOZ:[id number] DOB: Mar 18, 1931 DOA: 11/03/2012 PCP: Patrick Givens, MD  Assessment/Plan: 1-Syncope/near syncope: Appears to be secondary to recent use of medications especially narcotics (newly prescribed for left knee pain). Other considerations includes TIA vs CVA (patient had hx of CVA in the past); orthostatic changes and also vasovagal syncope. Work up negative. -No abnormalities seen on telemetry or EKG -Cardiac enzymes negative X3 -Will cont Minimizing the use of pain medications (especially narcotics) -Cortisol WNL -2-D echo with just grade 1 diastolic dysfunction, preserved EF and no wall motion abnormalities. -Continue PT evaluation. -volume and dry weight to be adjusted by renal service during HD  2-Diabetes mellitus with end stage renal disease: Patient has been placed on a low carbohydrate diet. A1C 5.4  3-DIABETIC PERIPHERAL NEUROPATHY:  -B12 and TSH WNL. -Continue neurontin daily  4-HYPERTENSION: has remain stable, no orthostatic changes (not full accurate as he can not stand for 3 full minutes). No further lightheadedness or near syncope event.  5-End stage renal failure on dialysis: per renal, continue HD Monday-Wednesday-Friday.   6-Degenerative joint disease of knee, left: Degenerative changes seen on knee x-ray from 10/30/2012. Pain is better. S/P knee injection on 3/27; might need eventually knee replacement. For now due to degree of deconditioning and dismobility will try to place him if possible to SNF for rehab. If not will arrange [facility] services and provide DME's to facilitate care at home.  7-Anemia of chronic disease: Stable. IV iron and epogen to be determined as needed by renal service.   8-History of CVA (cerebrovascular accident): CT scan of the head negative for any acute intracranial abnormality; will continue Aggrenox for secondary prevention. No new focal neurologic deficit on exam at this point.  9-secondary  hyperparathyroidism: Continue Sensipar and Fosrenol. Renal service following for medication adjustment  Code Status: Full Family Communication: no family at bedside Disposition Plan: to be determined base on exam from PT and ability to place him.  Consultants:  Renal service  PT  [facility] orthopedic)  Procedures:  2-D echo pending  Antibiotics:  none  HPI/Subjective: No focal neurologic deficit, afebrile and in NAD. Unfortunately increased weakness, debility and need for assistance due to incapacity of wearing weight on his left leg. Will seek SNF placement. When standing he is off balance and feels he knee just give away.  Objective: Filed Vitals:   11/05/12 2206 11/05/12 2230 11/06/12 0517 11/06/12 0700  BP: 111/57  111/59   Pulse: 92  86   Temp: 98.4 F (36.9 C)  98.2 F (36.8 C)   TempSrc: Oral  Oral   Resp: 28 18 18    Height:      Weight:    88.9 kg (195 lb 15.8 oz)  SpO2: 95%  95%     Intake/Output Summary (Last 24 hours) at 11/06/12 1332 Last data filed at 11/06/12 1100  Gross per 24 hour  Intake    303 ml  Output    200 ml  Net    103 ml   Filed Weights   11/05/12 0645 11/05/12 1049 11/06/12 0700  Weight: 89.3 kg (196 lb 13.9 oz) 89.1 kg (196 lb 6.9 oz) 88.9 kg (195 lb 15.8 oz)    Exam:   General:  AAOX3, NAD, still with pain on his knee (even a little better); patient remains weak and with increased debility  Cardiovascular: regular rate, sinus on telemetry, no murmrus, no rubs or gallops  Respiratory: CTA  Abdomen: soft, NT, ND, positive  BS  Musculoskeletal: left knee with decrease range of motion and tenderness to palpation (not significant change from yesterday exam); no warmth or swelling sensation on exam.  Data Reviewed: Basic Metabolic Panel:  Recent Labs Lab 11/03/12 1230 11/03/12 1711 11/04/12 0455 11/05/12 0430  NA 142  --  138 137  K 4.4  --  3.9 4.6  CL 98  --  98 96  CO2 30  --  28 26  GLUCOSE 124*  --  113*  145*  BUN 70*  --  34* 62*  CREATININE 9.79* 10.07* 5.85* 8.37*  CALCIUM 9.7  --  9.4 9.7   CBC:  Recent Labs Lab 11/03/12 1230 11/03/12 1711 11/04/12 0455 11/05/12 0430  WBC 10.5 10.1 8.5 8.3  NEUTROABS 8.3*  --   --   --   HGB 12.2* 11.4* 11.4* 11.1*  HCT 35.1* 33.1* 32.7* 31.9*  MCV 82.4 81.7 80.0 79.6  PLT 321 319 293 305   Cardiac Enzymes:  Recent Labs Lab 11/03/12 1705 11/03/12 2309 11/04/12 0457  TROPONINI <0.30 <0.30 <0.30   CBG:  Recent Labs Lab 11/05/12 1117 11/05/12 1638 11/05/12 2205 11/06/12 0751 11/06/12 1140  GLUCAP 114* 118* 128* 137* 114*    Recent Results (from the past 240 hour(s))  URINE CULTURE     Status: None   Collection Time    11/03/12  2:03 PM      Result Value Range Status   Specimen Description URINE, CLEAN CATCH   Final   Special Requests none Normal   Final   Culture  Setup Time 11/03/2012 14:32   Final   Colony Count NO GROWTH   Final   Culture NO GROWTH   Final   Report Status 11/04/2012 FINAL   Final     Studies: No results found.  Scheduled Meds: . aspirin  81 mg Oral Daily  . cinacalcet  30 mg Oral BID  . dipyridamole-aspirin  1 capsule Oral BID  . doxercalciferol  8 mcg Intravenous Q M,W,F-HD  . feeding supplement (NEPRO CARB STEADY)  237 mL Oral Q24H  . gabapentin  100 mg Oral QHS  . heparin  5,000 Units Subcutaneous Q8H  . insulin aspart  0-9 Units Subcutaneous TID WC  . lanthanum  1,000 mg Oral TID WC  . multivitamin  1 tablet Oral QPC supper  . omega-3 acid ethyl esters  1 g Oral BID  . sodium chloride  3 mL Intravenous Q12H   Continuous Infusions:   Principal Problem:   Syncope Active Problems:   Diabetes mellitus with end stage renal disease   DIABETIC PERIPHERAL NEUROPATHY   HYPERTENSION   End stage renal failure on dialysis   Degenerative joint disease of knee, left   Anemia of chronic disease   History of CVA (cerebrovascular accident)    Time spent: >30  minutes    [name]  [facility] Pager [phone]. If 7PM-7AM, please contact night-coverage at [email or web address], password [facility] 11/06/2012, 1:32 PM  LOS: 3 days

## 2012-11-06 NOTE — Progress Notes (Signed)
[facility] Progress Note  Subjective:   No complaints, family says can't take care of him right now at home, falling, can't pick him up, etc.  Planning for SNF placement now, PASSAR sent out.   Objective Filed Vitals:   11/05/12 2206 11/05/12 2230 11/06/12 0517 11/06/12 0700  BP: 111/57  111/59   Pulse: 92  86   Temp: 98.4 F (36.9 C)  98.2 F (36.8 C)   TempSrc: Oral  Oral   Resp: 28 18 18    Height:      Weight:    88.9 kg (195 lb 15.8 oz)  SpO2: 95%  95%    Physical Exam General: Alert, cooperative, NAD Heart: RRR Lungs: CTA bilaterally. No wheezes, rales, rhonchi noted Abdomen: Soft, non-tender, normal BS Extremities: +edema Dialysis Access: LFA AVF cannulated  Dialysis Orders: Center: [place] on MWF .  EDW 92 kg HD Bath 2K/2Ca Time 3:45 Heparin 5000 u. Access LFA AVF buttonhole BFR 500 DFR A1.5 Hectorol 8 mcg IV/HD Epogen 0 Units IV/HD Venofer 0  Assessment/Plan: 1. Syncope / falling - work up negative, suspected due to pain meds per primary. Chronic debility also with worsening strength / gait and falling. Plan is now for SNF placement.  2. Left Knee Pain/ Hx DJD - Steroid injection (Kenalog) on 3/27 per ortho.  3. ESRD - MWF.  K+ 4.6. Next HD 3/31 4. Hypertension/volume - SBPs 120s. No op BP meds. No pulm edema on CXR. Weight down, "losing weight" per pt, but BP low and prob is vol depleted now. 0 UF today. 5. Anemia - Hgb 11.1 No ESA's for now 6. Metabolic bone disease - Ca 9.7 (9.5 corrected) P 3.8 on Fosrenol 1g TIDWC. PTH 239.9 on Sensipar 30 mg and Hectorol 8. (s/p subtotal parathyroidectomy) Renal panel pending. 7. Nutrition - Albumin 4.2 - high protein renal diet and multivitamin  8. DM - On SSI per admit. Last A1c 5.5%. Op HD center will continue routinely monitoring BG s/p steroid injection.  [name]  MD [phone] pgr    [phone] cell 11/06/2012, 9:03 AM   Additional Objective Labs: Basic Metabolic Panel:  Recent Labs Lab  11/03/12 1230 11/03/12 1711 11/04/12 0455 11/05/12 0430  NA 142  --  138 137  K 4.4  --  3.9 4.6  CL 98  --  98 96  CO2 30  --  28 26  GLUCOSE 124*  --  113* 145*  BUN 70*  --  34* 62*  CREATININE 9.79* 10.07* 5.85* 8.37*  CALCIUM 9.7  --  9.4 9.7   CBC:  Recent Labs Lab 11/03/12 1230 11/03/12 1711 11/04/12 0455 11/05/12 0430  WBC 10.5 10.1 8.5 8.3  NEUTROABS 8.3*  --   --   --   HGB 12.2* 11.4* 11.4* 11.1*  HCT 35.1* 33.1* 32.7* 31.9*  MCV 82.4 81.7 80.0 79.6  PLT 321 319 293 305   Blood Culture    Component Value Date/Time   SDES URINE, CLEAN CATCH 11/03/2012 1403   SPECREQUEST none Normal 11/03/2012 1403   CULT NO GROWTH 11/03/2012 1403   REPTSTATUS 11/04/2012 FINAL 11/03/2012 1403    Cardiac Enzymes:  Recent Labs Lab 11/03/12 1705 11/03/12 2309 11/04/12 0457  TROPONINI <0.30 <0.30 <0.30   CBG:  Recent Labs Lab 11/04/12 2115 11/05/12 1117 11/05/12 1638 11/05/12 2205 11/06/12 0751  GLUCAP 117* 114* 118* 128* 137*   Studies/Results: No results found. Medications:   . aspirin  81 mg Oral Daily  .  cinacalcet  30 mg Oral BID  . dipyridamole-aspirin  1 capsule Oral BID  . doxercalciferol  8 mcg Intravenous Q M,W,F-HD  . feeding supplement (NEPRO CARB STEADY)  237 mL Oral Q24H  . gabapentin  100 mg Oral QHS  . heparin  5,000 Units Subcutaneous Q8H  . insulin aspart  0-9 Units Subcutaneous TID WC  . lanthanum  1,000 mg Oral TID WC  . multivitamin  1 tablet Oral QPC supper  . omega-3 acid ethyl esters  1 g Oral BID  . sodium chloride  3 mL Intravenous Q12H

## 2012-11-06 NOTE — Progress Notes (Signed)
UR completed 

## 2012-11-07 LAB — GLUCOSE, CAPILLARY

## 2012-11-07 NOTE — Progress Notes (Signed)
[facility] PROGRESS NOTE  [name] ZOX:[id number] DOB: 11/16/1930 DOA: 11/03/2012 PCP: Crawford Givens, MD  Assessment/Plan: 1-Syncope/near syncope: Appears to be secondary to recent use of medications especially narcotics (newly prescribed for left knee pain). Other considerations includes TIA vs CVA (patient had hx of CVA in the past); orthostatic changes and also vasovagal syncope. Work up negative. -No abnormalities seen on telemetry or EKG -Cardiac enzymes negative X3 -Will cont Minimizing the use of pain medications (especially narcotics) -Cortisol WNL -2-D echo with just grade 1 diastolic dysfunction, preserved EF and no wall motion abnormalities. -Continue PT evaluation. -volume and dry weight to be adjusted by renal service during HD  2-Diabetes mellitus with end stage renal disease: Patient has been placed on a low carbohydrate diet. A1C 5.4; follow CBG's based on recent steroid shot to his knee.  3-DIABETIC PERIPHERAL NEUROPATHY:  -B12 and TSH WNL. -Continue neurontin daily  4-HYPERTENSION: has remain stable, no orthostatic changes (not full accurate as he can not stand for 3 full minutes). No further lightheadedness or near syncope event.  5-End stage renal failure on dialysis: per renal, continue HD Monday-Wednesday-Friday.   6-Degenerative joint disease of knee, left: Degenerative changes seen on knee x-ray from 10/30/2012. Pain is better. S/P knee injection on 3/27; might need eventually knee replacement. For now due to degree of deconditioning and dismobility will try to place him, if possible to SNF for rehab. If not will arrange [facility] services and provide DME's to facilitate care at home.  7-Anemia of chronic disease: Stable. IV iron and epogen to be determined as needed by renal service.   8-History of CVA (cerebrovascular accident): CT scan of the head negative for any acute intracranial abnormality; will continue Aggrenox for secondary prevention. No new focal  neurologic deficit on exam at this point.  9-secondary hyperparathyroidism: Continue Sensipar and Fosrenol. Renal service following for medication adjustment  Code Status: Full Family Communication: no family at bedside Disposition Plan: to be determined base on exam from PT and SNF ability to place him.  Consultants:  Renal service  PT  [facility] orthopedic)  Procedures:  2-D echo pending  Antibiotics:  none  HPI/Subjective: No focal neurologic deficit, afebrile and in NAD. Unfortunately increased weakness, debility and need for assistance due to incapacity of wearing weight on his left leg continue. Family unable to take of him in this condition; will seek SNF placement.   Objective: Filed Vitals:   11/06/12 0700 11/06/12 1350 11/06/12 2100 11/07/12 0500  BP:  104/54 108/64 121/66  Pulse:  92 88 82  Temp:  98.2 F (36.8 C) 98.4 F (36.9 C) 98.1 F (36.7 C)  TempSrc:  Oral    Resp:  18 18 18   Height:      Weight: 88.9 kg (195 lb 15.8 oz)   91.264 kg (201 lb 3.2 oz)  SpO2:  96% 96% 98%    Intake/Output Summary (Last 24 hours) at 11/07/12 1528 Last data filed at 11/07/12 0844  Gross per 24 hour  Intake    480 ml  Output    300 ml  Net    180 ml   Filed Weights   11/05/12 1049 11/06/12 0700 11/07/12 0500  Weight: 89.1 kg (196 lb 6.9 oz) 88.9 kg (195 lb 15.8 oz) 91.264 kg (201 lb 3.2 oz)    Exam:   General:  AAOX3, NAD, still with pain on his knee (especially with movement); patient remains weak and with increased debility  Cardiovascular: regular rate, sinus on  telemetry, no murmrus, no rubs or gallops  Respiratory: CTA  Abdomen: soft, NT, ND, positive BS  Musculoskeletal: left knee with decrease range of motion and tenderness to palpation (not significant change from yesterday exam); no warmth or swelling sensation on exam.  Data Reviewed: Basic Metabolic Panel:  Recent Labs Lab 11/03/12 1230 11/03/12 1711 11/04/12 0455 11/05/12 0430   NA 142  --  138 137  K 4.4  --  3.9 4.6  CL 98  --  98 96  CO2 30  --  28 26  GLUCOSE 124*  --  113* 145*  BUN 70*  --  34* 62*  CREATININE 9.79* 10.07* 5.85* 8.37*  CALCIUM 9.7  --  9.4 9.7   CBC:  Recent Labs Lab 11/03/12 1230 11/03/12 1711 11/04/12 0455 11/05/12 0430  WBC 10.5 10.1 8.5 8.3  NEUTROABS 8.3*  --   --   --   HGB 12.2* 11.4* 11.4* 11.1*  HCT 35.1* 33.1* 32.7* 31.9*  MCV 82.4 81.7 80.0 79.6  PLT 321 319 293 305   Cardiac Enzymes:  Recent Labs Lab 11/03/12 1705 11/03/12 2309 11/04/12 0457  TROPONINI <0.30 <0.30 <0.30   CBG:  Recent Labs Lab 11/06/12 1140 11/06/12 1702 11/06/12 2135 11/07/12 0739 11/07/12 1115  GLUCAP 114* 135* 110* 108* 136*    Recent Results (from the past 240 hour(s))  URINE CULTURE     Status: None   Collection Time    11/03/12  2:03 PM      Result Value Range Status   Specimen Description URINE, CLEAN CATCH   Final   Special Requests none Normal   Final   Culture  Setup Time 11/03/2012 14:32   Final   Colony Count NO GROWTH   Final   Culture NO GROWTH   Final   Report Status 11/04/2012 FINAL   Final     Studies: No results found.  Scheduled Meds: . aspirin  81 mg Oral Daily  . cinacalcet  30 mg Oral BID  . dipyridamole-aspirin  1 capsule Oral BID  . doxercalciferol  8 mcg Intravenous Q M,W,F-HD  . feeding supplement (NEPRO CARB STEADY)  237 mL Oral Q24H  . gabapentin  100 mg Oral QHS  . heparin  5,000 Units Subcutaneous Q8H  . insulin aspart  0-9 Units Subcutaneous TID WC  . lanthanum  1,000 mg Oral TID WC  . multivitamin  1 tablet Oral QPC supper  . omega-3 acid ethyl esters  1 g Oral BID  . sodium chloride  3 mL Intravenous Q12H   Continuous Infusions:   Principal Problem:   Syncope Active Problems:   Diabetes mellitus with end stage renal disease   DIABETIC PERIPHERAL NEUROPATHY   HYPERTENSION   End stage renal failure on dialysis   Degenerative joint disease of knee, left   Anemia of chronic  disease   History of CVA (cerebrovascular accident)    Time spent: < 30 minutes    [name]  [facility] Pager [phone]. If 7PM-7AM, please contact night-coverage at [email or web address], password [facility] 11/07/2012, 3:28 PM  LOS: 4 days

## 2012-11-07 NOTE — Progress Notes (Signed)
[facility] Progress Note  Subjective:   No complaints  Objective Filed Vitals:   11/06/12 0700 11/06/12 1350 11/06/12 2100 11/07/12 0500  BP:  104/54 108/64 121/66  Pulse:  92 88 82  Temp:  98.2 F (36.8 C) 98.4 F (36.9 C) 98.1 F (36.7 C)  TempSrc:  Oral    Resp:  18 18 18   Height:      Weight: 88.9 kg (195 lb 15.8 oz)   91.264 kg (201 lb 3.2 oz)  SpO2:  96% 96% 98%   Physical Exam General: Alert, cooperative, NAD Heart: RRR Lungs: CTA bilaterally. No wheezes, rales, rhonchi noted Abdomen: Soft, non-tender, normal BS Extremities: 1+ LE edema Dialysis Access: LFA access patent  Dialysis Orders: Center: [place] on MWF .  EDW 92 kg HD Bath 2K/2Ca Time 3:45 Heparin 5000 u. Access LFA AVF buttonhole BFR 500 DFR A1.5 Hectorol 8 mcg IV/HD Epogen 0 Units IV/HD Venofer 0  Assessment/Plan: 1. Syncope / falling - work up negative, suspected due to pain meds per primary. Chronic debility also with worsening strength/gait and falling. Plan is now for SNF placement.  2. ESRD - MWF.  K+ 4.6. Next HD 3/31 3. Hypertension/volume - SBPs 120s. Not on BP meds. Was orthostatic at 86-87kg, well below dry wt of 92kg.  New EDW prob closer to 89kg. 4. Anemia - Hgb 11.1 No ESA's for now 5. Metabolic bone disease - Ca 9.7 (9.5 corrected) P 3.8 on Fosrenol 1g TIDWC. PTH 239.9 on Sensipar 30 mg and Hectorol 8. (s/p subtotal parathyroidectomy) 6. Nutrition - Albumin 4.2 - high protein renal diet and multivitamin  7. Left Knee Pain/ Hx DJD - Steroid injection (Kenalog) on 3/27 per ortho.  8. DM - On SSI per admit. Last A1c 5.5%. Op HD center will continue routinely monitoring BG s/p steroid injection.  [name]  MD [phone] pgr    [phone] cell 11/07/2012, 10:25 AM   Additional Objective Labs: Basic Metabolic Panel:  Recent Labs Lab 11/03/12 1230 11/03/12 1711 11/04/12 0455 11/05/12 0430  NA 142  --  138 137  K 4.4  --  3.9 4.6  CL 98  --  98 96  CO2 30  --  28 26   GLUCOSE 124*  --  113* 145*  BUN 70*  --  34* 62*  CREATININE 9.79* 10.07* 5.85* 8.37*  CALCIUM 9.7  --  9.4 9.7   CBC:  Recent Labs Lab 11/03/12 1230 11/03/12 1711 11/04/12 0455 11/05/12 0430  WBC 10.5 10.1 8.5 8.3  NEUTROABS 8.3*  --   --   --   HGB 12.2* 11.4* 11.4* 11.1*  HCT 35.1* 33.1* 32.7* 31.9*  MCV 82.4 81.7 80.0 79.6  PLT 321 319 293 305   Blood Culture    Component Value Date/Time   SDES URINE, CLEAN CATCH 11/03/2012 1403   SPECREQUEST none Normal 11/03/2012 1403   CULT NO GROWTH 11/03/2012 1403   REPTSTATUS 11/04/2012 FINAL 11/03/2012 1403    Cardiac Enzymes:  Recent Labs Lab 11/03/12 1705 11/03/12 2309 11/04/12 0457  TROPONINI <0.30 <0.30 <0.30   CBG:  Recent Labs Lab 11/06/12 0751 11/06/12 1140 11/06/12 1702 11/06/12 2135 11/07/12 0739  GLUCAP 137* 114* 135* 110* 108*   Studies/Results: No results found. Medications:   . aspirin  81 mg Oral Daily  . cinacalcet  30 mg Oral BID  . dipyridamole-aspirin  1 capsule Oral BID  . doxercalciferol  8 mcg Intravenous Q M,W,F-HD  . feeding supplement (NEPRO  CARB STEADY)  237 mL Oral Q24H  . gabapentin  100 mg Oral QHS  . heparin  5,000 Units Subcutaneous Q8H  . insulin aspart  0-9 Units Subcutaneous TID WC  . lanthanum  1,000 mg Oral TID WC  . multivitamin  1 tablet Oral QPC supper  . omega-3 acid ethyl esters  1 g Oral BID  . sodium chloride  3 mL Intravenous Q12H

## 2012-11-08 ENCOUNTER — Telehealth: Payer: Self-pay | Admitting: Family Medicine

## 2012-11-08 LAB — RENAL FUNCTION PANEL
BUN: 139 mg/dL — ABNORMAL HIGH (ref 6–23)
Calcium: 9.3 mg/dL (ref 8.4–10.5)
GFR calc Af Amer: 5 mL/min — ABNORMAL LOW (ref >90)
Glucose, Bld: 126 mg/dL — ABNORMAL HIGH (ref 70–99)
Phosphorus: 6 mg/dL — ABNORMAL HIGH (ref 2.3–4.6)
Sodium: 131 mEq/L — ABNORMAL LOW (ref 135–145)

## 2012-11-08 LAB — GLUCOSE, CAPILLARY
Glucose-Capillary: 103 mg/dL — ABNORMAL HIGH (ref 70–99)
Glucose-Capillary: 114 mg/dL — ABNORMAL HIGH (ref 70–99)

## 2012-11-08 LAB — CBC
Hemoglobin: 10.2 g/dL — ABNORMAL LOW (ref 13.0–17.0)
MCH: 27.9 pg (ref 26.0–34.0)
MCHC: 36.2 g/dL — ABNORMAL HIGH (ref 30.0–36.0)
RDW: 13.4 % (ref 11.5–15.5)

## 2012-11-08 MED ORDER — NEPRO/CARBSTEADY PO LIQD: 237.0000 mL | Freq: Two times a day (BID) | ORAL | Status: AC

## 2012-11-08 MED ORDER — GABAPENTIN 100 MG PO CAPS: 200.0000 mg | ORAL_CAPSULE | Freq: Every day | ORAL | Status: DC

## 2012-11-08 MED ORDER — OXYCODONE HCL 5 MG PO TABS: 5.0000 mg | ORAL_TABLET | Freq: Three times a day (TID) | ORAL | Status: DC | PRN

## 2012-11-08 NOTE — Progress Notes (Signed)
Pt provided with copy of AVS for discharge. No questions at this time. AVS copy sent to [facility] with patient. IV removed with tip intact. Pt awaiting PTAR. Levonne Spiller, RN

## 2012-11-08 NOTE — Progress Notes (Signed)
[facility] PROGRESS NOTE  [name] ZOX:[id number] DOB: [birth year] DOA: 11/03/2012 PCP: Crawford Givens, MD  Assessment/Plan: 1-Syncope/near syncope: Appears to be secondary to recent use of medications especially narcotics (newly prescribed for left knee pain). Other considerations includes TIA vs CVA (patient had hx of CVA in the past); orthostatic changes and also vasovagal syncope. Work up negative. -No abnormalities seen on telemetry or EKG -Cardiac enzymes negative X3 -Will cont Minimizing the use of pain medications (especially narcotics) -Cortisol WNL -2-D echo with just grade 1 diastolic dysfunction, preserved EF and no wall motion abnormalities. -Continue PT evaluation. -volume and dry weight to be adjusted by renal service during HD  2-Diabetes mellitus with end stage renal disease: Patient has been placed on a low carbohydrate diet. A1C 5.4; follow CBG's based on recent steroid shot to his knee.  3-DIABETIC PERIPHERAL NEUROPATHY:  -B12 and TSH WNL. -Continue neurontin daily  4-HYPERTENSION: has remain stable, no orthostatic changes (not full accurate as he can not stand for 3 full minutes). No further lightheadedness or near syncope event.  5-End stage renal failure on dialysis: per renal, continue HD Monday-Wednesday-Friday.   6-Degenerative joint disease of knee, left: Degenerative changes seen on knee x-ray from 10/30/2012. Pain is better. S/P knee injection on 3/27; might need eventually knee replacement. For now due to degree of deconditioning and dismobility will try to place him, if possible to SNF for rehab. If not will arrange [facility] services and provide DME's to facilitate care at home.  7-Anemia of chronic disease: Stable. IV iron and epogen to be determined as needed by renal service.   8-History of CVA (cerebrovascular accident): CT scan of the head negative for any acute intracranial abnormality; will continue Aggrenox for secondary prevention. No new focal  neurologic deficit on exam at this point.  9-secondary hyperparathyroidism: Continue Sensipar and Fosrenol. Renal service following for medication adjustment  Code Status: Full Family Communication: no family at bedside Disposition Plan: to be determined base on exam from PT and SNF ability to place him.  Consultants:  Renal service  PT  [facility] orthopedic)  Procedures:  2-D echo pending  Antibiotics:  none  HPI/Subjective: No focal neurologic deficit, afebrile and in NAD. Unfortunately increased weakness, debility and need for assistance due to incapacity of wearing weight on his left leg continues. Waiting bed offers and final decision on placement.  Objective: Filed Vitals:   11/07/12 2119 11/07/12 2135 11/07/12 2302 11/08/12 0540  BP: 93/43 102/52 103/52 122/67  Pulse: 83   83  Temp: 98.2 F (36.8 C)   98.2 F (36.8 C)  TempSrc: Oral   Oral  Resp: 18   20  Height:      Weight:    91.3 kg (201 lb 4.5 oz)  SpO2: 100%   97%    Intake/Output Summary (Last 24 hours) at 11/08/12 1127 Last data filed at 11/08/12 8119  Gross per 24 hour  Intake    240 ml  Output    800 ml  Net   -560 ml   Filed Weights   11/06/12 0700 11/07/12 0500 11/08/12 0540  Weight: 88.9 kg (195 lb 15.8 oz) 91.264 kg (201 lb 3.2 oz) 91.3 kg (201 lb 4.5 oz)    Exam:   General:  AAOX3, NAD, still with pain on his knee (especially with movement); patient remains weak and with increased debility.  Cardiovascular: regular rate, sinus on telemetry, no murmrus, no rubs or gallops  Respiratory: CTA  Abdomen: soft, NT, ND,  positive BS  Musculoskeletal: left knee with decrease range of motion and tenderness to palpation (not significant change from yesterday exam); no warmth or swelling sensation on exam.  Data Reviewed: Basic Metabolic Panel:  Recent Labs Lab 11/03/12 1230 11/03/12 1711 11/04/12 0455 11/05/12 0430  NA 142  --  138 137  K 4.4  --  3.9 4.6  CL 98  --  98  96  CO2 30  --  28 26  GLUCOSE 124*  --  113* 145*  BUN 70*  --  34* 62*  CREATININE 9.79* 10.07* 5.85* 8.37*  CALCIUM 9.7  --  9.4 9.7   CBC:  Recent Labs Lab 11/03/12 1230 11/03/12 1711 11/04/12 0455 11/05/12 0430  WBC 10.5 10.1 8.5 8.3  NEUTROABS 8.3*  --   --   --   HGB 12.2* 11.4* 11.4* 11.1*  HCT 35.1* 33.1* 32.7* 31.9*  MCV 82.4 81.7 80.0 79.6  PLT 321 319 293 305   Cardiac Enzymes:  Recent Labs Lab 11/03/12 1705 11/03/12 2309 11/04/12 0457  TROPONINI <0.30 <0.30 <0.30   CBG:  Recent Labs Lab 11/07/12 0739 11/07/12 1115 11/07/12 1634 11/07/12 2116 11/08/12 0724  GLUCAP 108* 136* 128* 132* 103*    Recent Results (from the past 240 hour(s))  URINE CULTURE     Status: None   Collection Time    11/03/12  2:03 PM      Result Value Range Status   Specimen Description URINE, CLEAN CATCH   Final   Special Requests none Normal   Final   Culture  Setup Time 11/03/2012 14:32   Final   Colony Count NO GROWTH   Final   Culture NO GROWTH   Final   Report Status 11/04/2012 FINAL   Final     Studies: No results found.  Scheduled Meds: . aspirin  81 mg Oral Daily  . cinacalcet  30 mg Oral BID  . dipyridamole-aspirin  1 capsule Oral BID  . doxercalciferol  8 mcg Intravenous Q M,W,F-HD  . feeding supplement (NEPRO CARB STEADY)  237 mL Oral Q24H  . gabapentin  100 mg Oral QHS  . heparin  5,000 Units Subcutaneous Q8H  . insulin aspart  0-9 Units Subcutaneous TID WC  . lanthanum  1,000 mg Oral TID WC  . multivitamin  1 tablet Oral QPC supper  . omega-3 acid ethyl esters  1 g Oral BID  . sodium chloride  3 mL Intravenous Q12H   Continuous Infusions:   Principal Problem:   Syncope Active Problems:   Diabetes mellitus with end stage renal disease   DIABETIC PERIPHERAL NEUROPATHY   HYPERTENSION   End stage renal failure on dialysis   Degenerative joint disease of knee, left   Anemia of chronic disease   History of CVA (cerebrovascular  accident)   Time spent: < 30 minutes   [name]  [facility] Pager [phone]. If 7PM-7AM, please contact night-coverage at [email or web address], password [facility] 11/08/2012, 11:27 AM  LOS: 5 days

## 2012-11-08 NOTE — Progress Notes (Signed)
CSW met with pt and called the pt's wife [name], they have chosen [facility]. CSW contacted Dr. Gwenlyn Perking to let him know that the pt and family have accepted a SNF.  CSW spoke with bedside nurse and she stated that the pt will be receiving dialysis today and then he can be transferred to [facility] this afternoon.   Sherald Barge, LCSW-A Clinical Social Worker [phone]

## 2012-11-08 NOTE — Progress Notes (Signed)
S: No new CO O:BP 122/67  Pulse 83  Temp(Src) 98.2 F (36.8 C) (Oral)  Resp 20  Ht 6\' 6"  (1.981 m)  Wt 91.3 kg (201 lb 4.5 oz)  BMI 23.26 kg/m2  SpO2 97%  Intake/Output Summary (Last 24 hours) at 11/08/12 2841 Last data filed at 11/08/12 3244  Gross per 24 hour  Intake    480 ml  Output    800 ml  Net   -320 ml   Weight change: 0.036 kg (1.3 oz) WNU:UVOZD and alert CVS:RRR Resp:clear Abd:+ BS NTND Ext: no edema.  Lt AVF + bruit NEURO:Mild confusion   . aspirin  81 mg Oral Daily  . cinacalcet  30 mg Oral BID  . dipyridamole-aspirin  1 capsule Oral BID  . doxercalciferol  8 mcg Intravenous Q M,W,F-HD  . feeding supplement (NEPRO CARB STEADY)  237 mL Oral Q24H  . gabapentin  100 mg Oral QHS  . heparin  5,000 Units Subcutaneous Q8H  . insulin aspart  0-9 Units Subcutaneous TID WC  . lanthanum  1,000 mg Oral TID WC  . multivitamin  1 tablet Oral QPC supper  . omega-3 acid ethyl esters  1 g Oral BID  . sodium chloride  3 mL Intravenous Q12H   No results found. BMET    Component Value Date/Time   NA 137 11/05/2012 0430   K 4.6 11/05/2012 0430   CL 96 11/05/2012 0430   CO2 26 11/05/2012 0430   GLUCOSE 145* 11/05/2012 0430   BUN 62* 11/05/2012 0430   CREATININE 8.37* 11/05/2012 0430   CALCIUM 9.7 11/05/2012 0430   CALCIUM 9.9 09/24/2007 1113   GFRNONAA 5* 11/05/2012 0430   GFRAA 6* 11/05/2012 0430   CBC    Component Value Date/Time   WBC 8.3 11/05/2012 0430   RBC 4.01* 11/05/2012 0430   HGB 11.1* 11/05/2012 0430   HCT 31.9* 11/05/2012 0430   PLT 305 11/05/2012 0430   MCV 79.6 11/05/2012 0430   MCH 27.7 11/05/2012 0430   MCHC 34.8 11/05/2012 0430   RDW 13.7 11/05/2012 0430   LYMPHSABS 1.1 11/03/2012 1230   MONOABS 0.9 11/03/2012 1230   EOSABS 0.2 11/03/2012 1230   BASOSABS 0.0 11/03/2012 1230     Assessment: 1. Syncope ? Secondary to hypotension.  Baseline BP are soft 2.Anemia 3. Sec HPTH on hect and Sensipar 4. DM 5. ESRD   Plan: 1. HD today 2.  He is quite weak and  suspect he will need SNF   [name]

## 2012-11-08 NOTE — Telephone Encounter (Signed)
Mr. Cockerell dropped off a fmla form to be filled out.

## 2012-11-08 NOTE — Discharge Summary (Signed)
Physician Discharge Summary  Hulbert Branscome ZOX:[id number] DOB: 1931/04/22 DOA: 11/03/2012  PCP: Crawford Givens, MD  Admit date: 11/03/2012 Discharge date: 11/08/2012  Time spent: >30 minutes  Recommendations for Outpatient Follow-up:  -Take medications as prescribed -Keep yourself well hydrated -PT/OT at the facility per rehab protocol -Follow up with Dr. Charlann Boxer in 2-3 weeks [facility])  Discharge Diagnoses:  Principal Problem:   Syncope Active Problems:   Diabetes mellitus with end stage renal disease   DIABETIC PERIPHERAL NEUROPATHY   HYPERTENSION   End stage renal failure on dialysis   Degenerative joint disease of knee, left   Anemia of chronic disease   History of CVA (cerebrovascular accident)   Discharge Condition: stable and improved. Still with generalized weakness, deconditioning and knee pain on standing. Will be discharged to SNF for rehabilitation.  Diet recommendation: Modified carb diet  Filed Weights   11/06/12 0700 11/07/12 0500 11/08/12 0540  Weight: 88.9 kg (195 lb 15.8 oz) 91.264 kg (201 lb 3.2 oz) 91.3 kg (201 lb 4.5 oz)    History of present illness:  79 y.o. male with past medical history significant for hypertension, diabetes, end-stage renal disease on hemodialysis (Monday-Wednesday-Friday), degenerative joint disease, and history of stroke; came to the hospital secondary to near syncope/syncope event. , He reports that this morning when they were helping him get ready to go to his hemodialysis session patient suddenly experienced a brief syncopal episode at home; patient denies any chest pain, palpitations, diaphoresis, headache, blurry to, or any other prodromic symptoms to passing out. He in fact did no have any recollection of passing out. Family reports that event lasted approx 3 minutes; he regained consciousness and was completely appropriate since. Family denies any fecal incontinence, urine incontinence, seizure activity or postictal  symptoms. In the ED no acute ischemic changes were seen on EKG, troponin negative x1, chest x-ray without acute infiltrates and a head CT without any acute intracranial abnormalities. [facility] has been called to me the patient for further evaluation and treatment.  Of note patient has been recently placed on narcotics after a recent visit to the ED due to left knee pain; he in fact received a dose of this medication prior to whole event to occurred.     Hospital Course:  1-Syncope/near syncope: Appears to be secondary to recent use of medications especially narcotics (newly prescribed for left knee pain). Other considerations includes TIA vs CVA (patient had hx of CVA in the past); orthostatic changes and also vasovagal syncope. Work up negative:  -No abnormalities seen on telemetry or EKG  -Cardiac enzymes negative X3  -Will cont Minimizing the use of pain medications (especially narcotics)  -Cortisol WNL  -2-D echo with just grade 1 diastolic dysfunction, preserved EF and no wall motion abnormalities.  -Continue PT evaluation.  -volume and dry weight to be adjusted by renal service during HD   2-Diabetes mellitus with end stage renal disease: Patient  A1C 5.4; follow low carb diet.   3-DIABETIC PERIPHERAL NEUROPATHY:  -B12 and TSH WNL.  -Continue neurontin daily   4-HYPERTENSION: has remain stable, no orthostatic changes after HD. No further lightheadedness or near syncope event.  -volume to be managed by renal service with HD.  5-End stage renal failure on dialysis: per renal, continue HD Monday-Wednesday-Friday.   6-Degenerative joint disease of knee, left: Degenerative changes seen on knee x-ray from 10/30/2012. Pain is better. S/P knee injection on 3/27; might need eventually knee replacement. For now due to degree of deconditioning and  dismobility will discharge to SNF for rehabilitation.  7-Anemia of chronic disease: Stable. IV iron and epogen to be determined as needed  by renal service.   8-History of CVA (cerebrovascular accident): CT scan of the head negative for any acute intracranial abnormality; will continue Aggrenox for secondary prevention. No new focal neurologic deficit on exam at this point.   9-secondary hyperparathyroidism: Continue Sensipar and Fosrenol. Renal service following for medication adjustment    Procedures: 2-D echo (no wall motion abnormalities; preserved EF and grade 1 diastolic dysfunction)  Consultations:  Renal service  PT  Orthopedic  Discharge Exam: Filed Vitals:   11/07/12 2119 11/07/12 2135 11/07/12 2302 11/08/12 0540  BP: 93/43 102/52 103/52 122/67  Pulse: 83   83  Temp: 98.2 F (36.8 C)   98.2 F (36.8 C)  TempSrc: Oral   Oral  Resp: 18   20  Height:      Weight:    91.3 kg (201 lb 4.5 oz)  SpO2: 100%   97%   General: AAOX3, NAD, still with pain on his knee (especially with movement); patient remains weak and with increased debility.  Cardiovascular: regular rate, sinus on telemetry, no murmrus, no rubs or gallops  Respiratory: CTA  Abdomen: soft, NT, ND, positive BS  Musculoskeletal: left knee with decrease range of motion and tenderness to palpation (not significant change from yesterday exam); no warmth or swelling sensation on exam.   Discharge Instructions  Discharge Orders   Future Orders Complete By Expires     Discharge instructions  As directed     Comments:      -Take medications as prescribed -Keep yourself well hydrated -PT/OT at the facility per rehab protocol -Follow up with Dr. Charlann Boxer in 2-3 weeks [facility])        Medication List    STOP taking these medications       HYDROcodone-acetaminophen 5-325 MG per tablet  Commonly known as:  NORCO/VICODIN      TAKE these medications       aspirin 81 MG EC tablet  Take 81 mg by mouth daily.     dipyridamole-aspirin 200-25 MG per 12 hr capsule  Commonly known as:  AGGRENOX  Take 1 capsule by mouth 2 (two) times  daily.     feeding supplement (NEPRO CARB STEADY) Liqd  Take 237 mLs by mouth 2 (two) times daily between meals.     Fish Oil 1000 MG Caps  Take 1 capsule by mouth daily.     gabapentin 100 MG capsule  Commonly known as:  NEURONTIN  Take 2 capsules (200 mg total) by mouth at bedtime.     lanthanum 1000 MG chewable tablet  Commonly known as:  FOSRENOL  Chew 1,000 mg by mouth 3 (three) times daily with meals.     multivitamin Tabs tablet  Take 1 tablet by mouth daily.     oxyCODONE 5 MG immediate release tablet  Commonly known as:  Oxy IR/ROXICODONE  Take 1 tablet (5 mg total) by mouth every 8 (eight) hours as needed for pain.     SENSIPAR 30 MG tablet  Generic drug:  cinacalcet  Take 30 mg by mouth 2 (two) times daily.           Follow-up Information   Follow up with Crawford Givens, MD. Schedule an appointment as soon as possible for a visit in 2 weeks.   Contact information:   [address] [place] [address] [phone]  Follow up with Shelda Pal, MD. Schedule an appointment as soon as possible for a visit in 3 weeks.   Contact information:   [phone] [place] [address] [phone]        The results of significant diagnostics from this hospitalization (including imaging, microbiology, ancillary and laboratory) are listed below for reference.    Significant Diagnostic Studies: Dg Chest 2 View  11/03/2012  *RADIOLOGY REPORT*  Clinical Data: Syncope and weakness  CHEST - 2 VIEW  Comparison: 04/13/2012  Findings: Heart size is mildly enlarged.  There is no pleural effusion or edema.  No airspace consolidation.  Review of the visualized osseous structures is significant for mild thoracic spondylosis.  IMPRESSION:  1.  Cardiac enlargement. 2.  No acute findings.   Original Report Authenticated By: Signa Kell, M.D.    Ct Head Wo Contrast  11/03/2012  *RADIOLOGY REPORT*  Clinical Data: Vertigo  followed by syncope.  Left leg weakness.  CT HEAD WITHOUT CONTRAST  Technique:  Contiguous axial images were obtained from the base of the skull through the vertex without contrast.  Comparison: None.  Findings: There is a small focal area of low attenuation in the left frontal deep white matter (image 22).  No additional evidence of acute infarct, acute hemorrhage, mass lesion, mass effect or hydrocephalus.  Mild to moderate periventricular low attenuation. Probable dilated perivascular space in the right basal ganglia. Visualized portion of the paranasal sinuses and mastoid air cells are clear.  IMPRESSION:  1.  Focal area of low attenuation in the left frontal deep white matter may represent an area of remote ischemia.  Difficult to definitively exclude an acute or subacute infarct. 2.  Chronic microvascular white matter ischemic changes.   Original Report Authenticated By: Leanna Battles, M.D.    Dg Knee Complete 4 Views Left  10/30/2012  *RADIOLOGY REPORT*  Clinical Data: Left knee pain.  No known injury.  LEFT KNEE - COMPLETE 4+ VIEW  Comparison: None  Findings: Diffuse osteopenia is noted. Mild to moderate tricompartmental degenerative changes are identified. There is no evidence of acute fracture, subluxation or dislocation. There is no evidence of knee effusion. No focal bony lesions are identified. Heavy vascular calcifications are present.  IMPRESSION: No evidence of acute abnormality.  Mild to moderate tricompartmental degenerative changes.   Original Report Authenticated By: Harmon Pier, M.D.     Microbiology: Recent Results (from the past 240 hour(s))  URINE CULTURE     Status: None   Collection Time    11/03/12  2:03 PM      Result Value Range Status   Specimen Description URINE, CLEAN CATCH   Final   Special Requests none Normal   Final   Culture  Setup Time 11/03/2012 14:32   Final   Colony Count NO GROWTH   Final   Culture NO GROWTH   Final   Report Status 11/04/2012 FINAL   Final      Labs: Basic Metabolic Panel:  Recent Labs Lab 11/03/12 1230 11/03/12 1711 11/04/12 0455 11/05/12 0430  NA 142  --  138 137  K 4.4  --  3.9 4.6  CL 98  --  98 96  CO2 30  --  28 26  GLUCOSE 124*  --  113* 145*  BUN 70*  --  34* 62*  CREATININE 9.79* 10.07* 5.85* 8.37*  CALCIUM 9.7  --  9.4 9.7   CBC:  Recent Labs Lab 11/03/12 1230  11/03/12 1711 11/04/12 0455 11/05/12 0430  WBC 10.5 10.1 8.5 8.3  NEUTROABS 8.3*  --   --   --   HGB 12.2* 11.4* 11.4* 11.1*  HCT 35.1* 33.1* 32.7* 31.9*  MCV 82.4 81.7 80.0 79.6  PLT 321 319 293 305   Cardiac Enzymes:  Recent Labs Lab 11/03/12 1705 11/03/12 2309 11/04/12 0457  TROPONINI <0.30 <0.30 <0.30   CBG:  Recent Labs Lab 11/07/12 1115 11/07/12 1634 11/07/12 2116 11/08/12 0724 11/08/12 1134  GLUCAP 136* 128* 132* 103* 114*       Signed:  Tad Fancher  [facility] 11/08/2012, 12:04 PM

## 2012-11-08 NOTE — Progress Notes (Signed)
PT Cancellation Note  Patient Details Name: [name] MRN: [id number] DOB: 01/15/1931   Cancelled Treatment:    Reason Eval/Treat Not Completed: Patient at procedure or test/unavailable;Other (comment) (dialysis all pm) 11/08/2012  [place] Bing, PT [phone] (pager)  Marella Vanderpol, [name] 11/08/2012, 5:21 PM

## 2012-11-08 NOTE — Telephone Encounter (Signed)
In your in basket

## 2012-11-09 ENCOUNTER — Other Ambulatory Visit: Payer: Self-pay | Admitting: *Deleted

## 2012-11-09 ENCOUNTER — Telehealth: Payer: Self-pay | Admitting: *Deleted

## 2012-11-09 MED ORDER — OXYCODONE HCL 5 MG PO TABS: 5.0000 mg | ORAL_TABLET | Freq: Three times a day (TID) | ORAL | Status: DC | PRN

## 2012-11-09 NOTE — Telephone Encounter (Signed)
Son, [name], says he has been to see his Dad 2 times this week and he is not improving at all.  He simply has no strength.  He is now at [facility] for PT but just started that yesterday.  He says it was ordered for 2-3 weeks.  Patient's wife and son are wanting more information in terms of his prognosis and what is causing his weakness.    Son also asking about the FMLA paperwork that was left yesterday.  The dates that were included on the yellow sticky note indicated dates that he needed the FMLA to cover.  The son was apparently on vacation during some of this time but he says that his needing to be out to help with his father will likely be an ongoing thing.

## 2012-11-09 NOTE — Telephone Encounter (Signed)
I would have them talk to the MD that ordered the PT to get their expectations.  He has a history of multiple ongoing/chronic problems (ie ESRD) and all likely contribute.  And I'll work on [employer].

## 2012-11-09 NOTE — Telephone Encounter (Signed)
I'll work on the hard copy.  

## 2012-11-09 NOTE — Telephone Encounter (Signed)
Charles advised.

## 2012-11-12 ENCOUNTER — Non-Acute Institutional Stay: Payer: Self-pay | Admitting: Internal Medicine

## 2012-11-12 NOTE — Progress Notes (Signed)
Patient ID: [name], male   DOB: 11/22/1930, 79 y.o.   MRN: [id number]  History and Physical  [name] WUJ:[id number] DOB: [birth year] DOA: (Not on file)   PCP: Patrick Givens, MD   Chief Complaint:  Post hospitalization SNF follow up  Hospitalization: [name] ( 11/03/12--11/08/12)  Reason for hospitalization: syncope   HPI:  [name] is an 82 male with past medical history significant for hypertension, diabetes, end-stage renal disease on hemodialysis (M, W,F), degenerative joint disease,  history of stroke was admitted to [name] for syncopal event lasting for almost 3 minutes..he did not have any chest pain, SOB, dizziness, vertigo, nausea, vomiting or seizure like symptoms. In the ED EKG was unremarkable, 1 set of  troponin negative, chest x-ray without acute infiltrates and a head CT without any acute intracranial abnormalities. patient admitted to telemetry, serial cardiac enzymes were negative, telemetry monitoring was unremarkable. 2D echo was done with some grade 1 diastolic dysfunction but preserved EF. Symptoms likely thought to be due to effects of recently prescribed narcotics for left knee pain . Also had some orthostatic BP on presentation.. Patient seen by PT and recommended for either SNF vs home with 24 hr supervision as patient required more than 2 people for transfer. Narcotics were minimized and patient was stable without any further syncopal events or orthostasis.   Patient discharged to [name] for need of physical therapy. He denies any specific symptoms on exam today but still has some left knee pain with difficulty ambulating he did however have started to participate with physical therapy. Denies any dizziness, headache, blurred vision, nausea, vomiting, fever, chills, chest pain, palpitations, shortness of breath, abdominal pain, bowel symptoms. He has left knee pain on movement but only labeled with the medications.   Review of Systems:   Constitutional: Denies fever, chills, diaphoresis, appetite change and fatigue.  HEENT: Denies photophobia, eye pain, redness, hearing loss, ear pain, congestion, sore throat, rhinorrhea, sneezing, mouth sores, trouble swallowing, neck pain, neck stiffness and tinnitus.   Respiratory: Denies SOB, DOE, cough, chest tightness,  and wheezing.   Cardiovascular: Denies chest pain, palpitations and leg swelling.  Gastrointestinal: Denies nausea, vomiting, abdominal pain, diarrhea, constipation, blood in stool and abdominal distention.  Genitourinary: Denies dysuria, urgency, frequency, hematuria, flank pain and difficulty urinating.  Musculoskeletal: Left knee pain with impaired mobility,  denies back pain, Skin: Denies pallor, rash and wound.  Neurological: Denies dizziness, seizures, syncope, weakness, light-headedness, numbness and headaches.  Hematological: Denies adenopathy. Easy bruising, personal or family bleeding history  Psychiatric/Behavioral: Denies suicidal ideation, mood changes, confusion, nervousness, sleep disturbance and agitation   Past Medical History  Diagnosis Date  . Hypertension   . Diabetes mellitus     typeII / No meds  . Cataract 01/2002    Right  . Dialysis patient 10/13/2007  . Radiculopathy 05/14/2006    NCV study negative carpal tunnel, Left C5 radiculopathy  . Arthritis     Knee pain  . ESRD (end stage renal disease) on dialysis 2009    [facility] diaylsis Monday , Wed. and Friday per Dr. Kathrene Bongo  . Stroke   . Elevated PSA     h/o, pt had declined further eval.   . Syncope 11/02/2012   Past Surgical History  Procedure Laterality Date  . Varicose vein surgery  1978  . External fixation wrist fracture  1990  . Thyroidectomy, partial  08/03/2003    Right thyroid lobectomy, subtotal parathyr. sec hyperparthyroidismadenosis  . Av fistula  placement  07/2004    Left arm AV fistula placement [name])  . Eye surgery      Cataract    Social History:  reports that he quit smoking about 10 years ago. His smoking use included Cigarettes. He smoked 0.00 packs per day for 10 years. He has never used smokeless tobacco. He reports that he does not drink alcohol or use illicit drugs.  No Known Allergies  Family History  Problem Relation Age of Onset  . Cancer Father 8    Lung Cancer//? stomach cancer  . Cancer Sister 72    brain tumor    Prior to Admission medications   Medication Sig Start Date End Date Taking? Authorizing Provider  aspirin 81 MG EC tablet Take 81 mg by mouth daily.      Historical Provider, MD  cinacalcet (SENSIPAR) 30 MG tablet Take 30 mg by mouth 2 (two) times daily.     Historical Provider, MD  dipyridamole-aspirin (AGGRENOX) 200-25 MG per 12 hr capsule Take 1 capsule by mouth 2 (two) times daily. 09/19/11   Joaquim Nam, MD  gabapentin (NEURONTIN) 100 MG capsule Take 2 capsules (200 mg total) by mouth at bedtime. 11/08/12   Vassie Loll, MD  lanthanum (FOSRENOL) 1000 MG chewable tablet Chew 1,000 mg by mouth 3 (three) times daily with meals.      Historical Provider, MD  multivitamin (RENA-VIT) TABS tablet Take 1 tablet by mouth daily.      Historical Provider, MD  Nutritional Supplements (FEEDING SUPPLEMENT, NEPRO CARB STEADY,) LIQD Take 237 mLs by mouth 2 (two) times daily between meals. 11/08/12   Vassie Loll, MD  Omega-3 Fatty Acids (FISH OIL) 1000 MG CAPS Take 1 capsule by mouth daily.      Historical Provider, MD  oxyCODONE (OXY IR/ROXICODONE) 5 MG immediate release tablet Take 1 tablet (5 mg total) by mouth every 8 (eight) hours as needed for pain. 11/09/12   Claudie Revering, NP    Physical Exam:  Filed Vitals:   11/12/12 1025  BP: 111/67  Pulse: 99  Temp: 97.8 F (36.6 C)  Resp: 20  SpO2: 97%    Constitutional: Vital signs reviewed.  Patient is a well-developed and well-nourished in no acute distress and cooperative with exam. Alert and oriented x3.  Head: Normocephalic and  atraumatic Mouth: no erythema or exudates, MMM Eyes: PERRL, EOMI, conjunctivae normal, No scleral icterus.  Neck: Supple, Trachea midline normal ROM, No JVD, mass, thyromegaly, or carotid bruit present.  CVS: normal S1 and S2, normal murmurs rub or gallop Pulmonary/Chest: CTAB, no wheezes, rales, or rhonchi Abdominal: Soft. Non-tender, non-distended, bowel sounds are normal, no masses, organomegaly, or guarding present.  GU: no CVA tenderness Musculoskeletal: No joint deformities, some swelling and tenderness over in the medial limited range of motion.  Ext: no edema and no cyanosis, pulses palpable bilaterally (DP and PT) Neurological: A&O x3, has some weakness over left knee due to been otherwise normal strength bilaterally, cranial nerve II-XII are grossly intact, no focal motor deficit, sensory intactSkin: Warm, dry and intact. No rash, cyanosis, or clubbing.  Psychiatric: Normal mood and affect. speech and behavior is normal. Judgment and thought content normal. Cognition and memory are normal.   Labs on Admission:  Basic Metabolic Panel:  Recent Labs Lab 11/08/12 1300  NA 131*  K 4.8  CL 88*  CO2 22  GLUCOSE 126*  BUN 139*  CREATININE 10.18*  CALCIUM 9.3  PHOS 6.0*   Liver Function  Tests:  Recent Labs Lab 11/08/12 1300  ALBUMIN 2.8*   No results found for this basename: LIPASE, AMYLASE,  in the last 168 hours No results found for this basename: AMMONIA,  in the last 168 hours CBC:  Recent Labs Lab 11/08/12 1300  WBC 9.3  HGB 10.2*  HCT 28.2*  MCV 77.0*  PLT 338   Cardiac Enzymes: No results found for this basename: CKTOTAL, CKMB, CKMBINDEX, TROPONINI,  in the last 168 hours BNP: No components found with this basename: POCBNP,  CBG:  Recent Labs Lab 11/07/12 1634 11/07/12 2116 11/08/12 0724 11/08/12 1134 11/08/12 1731  GLUCAP 128* 132* 103* 114* 107*      Assessment/Plan  Syncope Appears to be due to use of narcotics recently. Likelihood of  orthostasis and vasovagal syncope could not be ruled out.  he had workup including cardiac enzymes monitoring, telemetry, 2-D echo was unremarkable. TIA was a thought of possibility given history of CVA with head CT was unremarkable. No further syncopal episode and prescribed narcotic discontinued. Started to participate with physical therapy.   Left knee pain Secondary to left knee arthritis and was seen by orthopedic consult was in the hospital. He received steroid injection locally. He  was recommended to followup with Dr. Charlann Boxer in the orthopedic clinic in 4 weeks.  End-stage renal disease Gets dialysis on Mondays Wednesdays and Fridays and tolerating well. Continue Sensipar and fosrenol for secondary hyperparathyroidism  Diabetes mellitus Not on any medications. Hemoglobin A1c 5.4 Continue Neurontin for neuropathy  Hypertension Blood pressure has anemia and is stable. No further orthostasis  Anemia Of chronic disease. Gets it was in therapy aortic E. from his nephrologist  History of CVA Continue Aggrenox for secondary stroke prevention   Diet: renal/ diabetic diet  [name] potential: Good  Activities of daily living Dressing independently, eating independently, bathing and toileting with some assistance, its full assistance with ambulation given left knee pain.  Code Status: Full code  Kennita Pavlovich  11/12/2012, 10:27 AM   Total time spent on admission: 60 minutes

## 2012-11-16 ENCOUNTER — Non-Acute Institutional Stay (SKILLED_NURSING_FACILITY): Payer: Medicare Other | Admitting: Nurse Practitioner

## 2012-11-16 ENCOUNTER — Encounter: Payer: Self-pay | Admitting: Nurse Practitioner

## 2012-11-16 DIAGNOSIS — M25569 Pain in unspecified knee: Secondary | ICD-10-CM

## 2012-11-16 DIAGNOSIS — M549 Dorsalgia, unspecified: Secondary | ICD-10-CM | POA: Insufficient documentation

## 2012-11-16 DIAGNOSIS — N186 End stage renal disease: Secondary | ICD-10-CM

## 2012-11-16 DIAGNOSIS — Z992 Dependence on renal dialysis: Secondary | ICD-10-CM

## 2012-11-16 DIAGNOSIS — M25562 Pain in left knee: Secondary | ICD-10-CM

## 2012-11-16 NOTE — Progress Notes (Signed)
Patient ID: [name], male   DOB: [birth year], 79 y.o.   MRN: [id number]   PCP: Crawford Givens, MD   Chief Complaint:  Acute visit due to increased pain and not able to participate in therapy    HPI:  Pt is an 14 male with past medical history significant for hypertension, diabetes, end-stage renal disease on hemodialysis (M, W,F), degenerative joint disease,  history of stroke was admitted to [facility] for syncopal event lasting for almost 3 minutes. Patient seen by PT and recommended for either SNF vs home with 24 hr supervision as patient required more than 2 people for transfer. Patient discharged to [facility] rehabilitation for need of physical therapy because  son could not care for him at home.  Narcotics were minimized and patient was stable without any further syncopal events or orthostasis.  Now with increased pain and worsening ambulation per son. per therapy he is unable to participate due to increased pain. pt denies any specific symptoms on exam and reports he never had pain at home other than some left knee pain with difficulty ambulating. Therapy, son and nursing reports increased pain in his back and hips and left leg that he complains about with movement.  When asked pt reports he is not in pain even though he complains of pain with any movement or palpation.  Review of Systems:  Constitutional: Denies fever, chills, diaphoresis, appetite change and fatigue.  Respiratory: Denies SOB, DOE, cough, chest tightness,  and wheezing.   Cardiovascular: Denies chest pain, palpitations and leg swelling.  Gastrointestinal: Denies nausea, vomiting, abdominal pain, diarrhea, constipation, blood in stool and abdominal distention.  Genitourinary: Denies dysuria, urgency, frequency, hematuria, flank pain and difficulty urinating.  Musculoskeletal: Left knee pain with impaired mobility,  denies back pain, Skin: Denies  rash and wound.  Neurological: Denies dizziness, seizures, syncope, has  weakness, light-headedness, numbness and headaches.  Hematological: Denies adenopathy. Easy bruising, personal or family bleeding history  Psychiatric/Behavioral: some confusion   Past Medical History  Diagnosis Date  . Hypertension   . Diabetes mellitus     typeII / No meds  . Cataract 01/2002    Right  . Dialysis patient 10/13/2007  . Radiculopathy 05/14/2006    NCV study negative carpal tunnel, Left C5 radiculopathy  . Arthritis     Knee pain  . ESRD (end stage renal disease) on dialysis 2009    [facility] diaylsis Monday , Wed. and Friday per Dr. Kathrene Bongo  . Stroke   . Elevated PSA     h/o, pt had declined further eval.   . Syncope 11/02/2012   Past Surgical History  Procedure Laterality Date  . Varicose vein surgery  1978  . External fixation wrist fracture  1990  . Thyroidectomy, partial  08/03/2003    Right thyroid lobectomy, subtotal parathyr. sec hyperparthyroidismadenosis  . Av fistula placement  07/2004    Left arm AV fistula placement [name])  . Eye surgery      Cataract   Social History:  reports that he quit smoking about 10 years ago. His smoking use included Cigarettes. He smoked 0.00 packs per day for 10 years. He has never used smokeless tobacco. He reports that he does not drink alcohol or use illicit drugs.  No Known Allergies  Family History  Problem Relation Age of Onset  . Cancer Father 31    Lung Cancer//? stomach cancer  . Cancer Sister 59    brain tumor    Prior  to Admission medications   Medication Sig Start Date End Date Taking? Authorizing Provider  aspirin 81 MG EC tablet Take 81 mg by mouth daily.      Historical Provider, MD  cinacalcet (SENSIPAR) 30 MG tablet Take 30 mg by mouth 2 (two) times daily.     Historical Provider, MD  dipyridamole-aspirin (AGGRENOX) 200-25 MG per 12 hr capsule Take 1 capsule by mouth 2 (two) times daily. 09/19/11   Joaquim Nam, MD  gabapentin (NEURONTIN) 100 MG capsule Take 2  capsules (200 mg total) by mouth at bedtime. 11/08/12   Vassie Loll, MD  lanthanum (FOSRENOL) 1000 MG chewable tablet Chew 1,000 mg by mouth 3 (three) times daily with meals.      Historical Provider, MD  multivitamin (RENA-VIT) TABS tablet Take 1 tablet by mouth daily.      Historical Provider, MD  Nutritional Supplements (FEEDING SUPPLEMENT, NEPRO CARB STEADY,) LIQD Take 237 mLs by mouth 2 (two) times daily between meals. 11/08/12   Vassie Loll, MD  Omega-3 Fatty Acids (FISH OIL) 1000 MG CAPS Take 1 capsule by mouth daily.      Historical Provider, MD  oxyCODONE (OXY IR/ROXICODONE) 5 MG immediate release tablet Take 1 tablet (5 mg total) by mouth every 8 (eight) hours as needed for pain. 11/09/12   Claudie Revering, NP    Physical Exam:  Filed Vitals:   11/16/12 1243  BP: 134/58  Pulse: 88  Temp: 98.6 F (37 C)  Resp: 20    Constitutional: Vital signs reviewed.  Patient is a well-developed and well-nourished in no acute distress and cooperative with exam. Alert and oriented Head: Normocephalic and atraumatic CVS: normal S1 and S2, normal murmurs rub or gallop Pulmonary/Chest: CTA bilaterally , no wheezes, rales, or rhonchi Abdominal: Soft. Non-tender, non-distended, bowel sounds are normal, no masses, organomegaly, or guarding present.  GU: no bladder or CVA tenderness Musculoskeletal: No joint deformities, tenderness to lumbar spine and limited ROM in bilateral Lower extremities  Ext: no edema and no cyanosis, pulses palpable bilaterally Neurological: A&O x3, has some weakness over left knee due to been otherwise normal strength bilaterally, cranial nerve II-XII are grossly intact, no focal motor deficit, sensory intactSkin: Warm, dry and intact. No rash, cyanosis, or clubbing.  Psychiatric: Normal mood and affect. speech and behavior is normal. Judgment and thought content normal.  Assessment/Plan  Left knee pain Secondary to left knee arthritis and was seen by orthopedic consult  was in the hospital. He received steroid injection locally. He  was recommended to followup with Dr. Charlann Boxer in the orthopedic clinic in 4 weeks.  End-stage renal disease Gets dialysis on Mondays Wednesdays and Fridays and tolerating well. Continue Sensipar and fosrenol for secondary hyperparathyroidism  Back and hip pain Will get xray of lumbar spine and left hip to rule out any abnormality Will schedule oxycodone 5mg  every am for pain. To hold for sedation or lethargy     Time spent with pt and updating son greater than 30 mins.

## 2012-12-01 ENCOUNTER — Other Ambulatory Visit (HOSPITAL_COMMUNITY): Payer: Self-pay | Admitting: Nephrology

## 2012-12-01 ENCOUNTER — Other Ambulatory Visit (HOSPITAL_COMMUNITY): Payer: Self-pay | Admitting: Family Medicine

## 2012-12-01 DIAGNOSIS — N186 End stage renal disease: Secondary | ICD-10-CM

## 2012-12-02 ENCOUNTER — Other Ambulatory Visit: Payer: Self-pay | Admitting: Orthopaedic Surgery

## 2012-12-02 DIAGNOSIS — M25552 Pain in left hip: Secondary | ICD-10-CM

## 2012-12-03 ENCOUNTER — Other Ambulatory Visit (HOSPITAL_COMMUNITY): Payer: Self-pay | Admitting: Nephrology

## 2012-12-03 ENCOUNTER — Ambulatory Visit (HOSPITAL_COMMUNITY)
Admission: RE | Admit: 2012-12-03 | Discharge: 2012-12-03 | Disposition: A | Discharge status: Home / Self Care | Payer: Medicare Other | Source: Ambulatory Visit | Attending: Nephrology | Admitting: Nephrology

## 2012-12-03 DIAGNOSIS — I871 Compression of vein: Secondary | ICD-10-CM | POA: Insufficient documentation

## 2012-12-03 DIAGNOSIS — N186 End stage renal disease: Secondary | ICD-10-CM

## 2012-12-03 DIAGNOSIS — T82898A Other specified complication of vascular prosthetic devices, implants and grafts, initial encounter: Secondary | ICD-10-CM | POA: Insufficient documentation

## 2012-12-03 DIAGNOSIS — Y832 Surgical operation with anastomosis, bypass or graft as the cause of abnormal reaction of the patient, or of later complication, without mention of misadventure at the time of the procedure: Secondary | ICD-10-CM | POA: Insufficient documentation

## 2012-12-03 MED ORDER — IOHEXOL 300 MG/ML  SOLN
100.0000 mL | Freq: Once | INTRAMUSCULAR | Status: AC | PRN
  Administered 2012-12-03: 50 mL via INTRAVENOUS

## 2012-12-03 NOTE — Procedures (Signed)
Technically successful fistulogram with angioplasty.  No immediate complications.   

## 2012-12-07 ENCOUNTER — Ambulatory Visit
Admission: RE | Admit: 2012-12-07 | Discharge: 2012-12-07 | Disposition: A | Discharge status: Home / Self Care | Payer: Medicare Other | Source: Ambulatory Visit | Attending: Orthopaedic Surgery | Admitting: Orthopaedic Surgery

## 2012-12-07 DIAGNOSIS — M25552 Pain in left hip: Secondary | ICD-10-CM

## 2012-12-08 ENCOUNTER — Telehealth: Payer: Self-pay

## 2012-12-08 NOTE — Telephone Encounter (Signed)
I called the numbers we had listed.  LMOVM.  Please call and try to get an update tomorrow.

## 2012-12-08 NOTE — Telephone Encounter (Signed)
Jones Bales left v/m that pt is still at [facility]; pt had CT scan of back today and pt is not going to be able to come home now; pt needs 24 hr assisted living facility. Charles request call back to discuss.

## 2012-12-09 ENCOUNTER — Ambulatory Visit: Payer: Medicare Other | Admitting: Family Medicine

## 2012-12-09 NOTE — Telephone Encounter (Signed)
Did you try the number listed at the bottom of the message?  [phone] (I think)

## 2012-12-09 NOTE — Telephone Encounter (Signed)
I didn't see that number and I used the other ones from demographics.  I just called the [phone] number and LMOVM.  See if you can get anyone tomorrow.  Thanks.

## 2012-12-10 NOTE — Telephone Encounter (Signed)
I talked to [name].  Pt isn't likely a candidate for surgery and likely won't likely be ambulatory.  His dementia is not improving.  He had been staying at [place].  They are looking into options for care/placement.  He wanted to give me an update.  I thanked him and told him I'd be thinking about them.

## 2012-12-10 NOTE — Telephone Encounter (Signed)
LMOVM that we are trying to return the call.

## 2012-12-17 ENCOUNTER — Encounter: Payer: Self-pay | Admitting: *Deleted

## 2012-12-17 ENCOUNTER — Telehealth: Payer: Self-pay

## 2012-12-17 DIAGNOSIS — E1129 Type 2 diabetes mellitus with other diabetic kidney complication: Secondary | ICD-10-CM

## 2012-12-17 DIAGNOSIS — I739 Peripheral vascular disease, unspecified: Secondary | ICD-10-CM

## 2012-12-17 DIAGNOSIS — N186 End stage renal disease: Secondary | ICD-10-CM

## 2012-12-17 DIAGNOSIS — E1165 Type 2 diabetes mellitus with hyperglycemia: Secondary | ICD-10-CM

## 2012-12-17 DIAGNOSIS — E1169 Type 2 diabetes mellitus with other specified complication: Secondary | ICD-10-CM

## 2012-12-17 DIAGNOSIS — L89309 Pressure ulcer of unspecified buttock, unspecified stage: Secondary | ICD-10-CM

## 2012-12-17 DIAGNOSIS — L89609 Pressure ulcer of unspecified heel, unspecified stage: Secondary | ICD-10-CM

## 2012-12-17 NOTE — Telephone Encounter (Signed)
I called pt's son.  Pt is reportedly max assist and and may need transfer out of the facility.  The family members have had trouble getting information from the in house doctor, Dr. Neva Seat.  See if you can get any information about the current MD's plan for the patient.  I am listed as PCP in the EMR but I haven't been seeing the patient since admission to hospital/[facility].  Thanks.

## 2012-12-17 NOTE — Telephone Encounter (Signed)
[facility] and they confirmed that Dr. Chilton Si [place]) is who we need to contact.  I devised a note explaining our need to secure information in order to help the family make some decisions about future care for the patient.  The note was faxed.  I tried to phone the Dr. Thomasene Lot office but the VM states they are closed.

## 2012-12-17 NOTE — Telephone Encounter (Signed)
[name] pt's son request call back from Dr Para March; pt is still at [facility] but PT has been stopped and pt is just laying in bed. [name] is still pursuing options for placement but doesn't know what type facility he should be looking for with pt's condition. Wants to discuss with Dr Para March. Please advise. [name] can be reached at home [phone] or cell [phone].

## 2012-12-21 ENCOUNTER — Encounter: Payer: Self-pay | Admitting: *Deleted

## 2012-12-21 ENCOUNTER — Encounter: Payer: Self-pay | Admitting: Nurse Practitioner

## 2012-12-21 ENCOUNTER — Non-Acute Institutional Stay (SKILLED_NURSING_FACILITY): Payer: Medicare Other | Admitting: Nurse Practitioner

## 2012-12-21 DIAGNOSIS — E1129 Type 2 diabetes mellitus with other diabetic kidney complication: Secondary | ICD-10-CM

## 2012-12-21 DIAGNOSIS — E1122 Type 2 diabetes mellitus with diabetic chronic kidney disease: Secondary | ICD-10-CM

## 2012-12-21 DIAGNOSIS — N186 End stage renal disease: Secondary | ICD-10-CM

## 2012-12-21 DIAGNOSIS — M25569 Pain in unspecified knee: Secondary | ICD-10-CM

## 2012-12-21 DIAGNOSIS — M25562 Pain in left knee: Secondary | ICD-10-CM

## 2012-12-21 DIAGNOSIS — D638 Anemia in other chronic diseases classified elsewhere: Secondary | ICD-10-CM

## 2012-12-21 NOTE — Assessment & Plan Note (Signed)
appt made with ortho to evaluate knee and hip pain.

## 2012-12-21 NOTE — Assessment & Plan Note (Signed)
Not currently on medications A1c was previously 5.4

## 2012-12-21 NOTE — Assessment & Plan Note (Signed)
ESRD on dialysis M,W,F

## 2012-12-21 NOTE — Telephone Encounter (Signed)
I talked to Dr. Chilton Si. Per his report, he is the primary doc while patient is at [facility].  Pt would still meet SNF need.  Medicare will typically cover the first 21 days of care and then a portion of the next 79 days (total of 100 days).  I would have the family talk to SW at [facility] about options.  It didn't sound like Dr. Chilton Si had any plans for transfer for the patient.  Dr. Chilton Si would try to talk to pt's family.  I thanked him for the call and we'll defer to Dr. Chilton Si in meantime.

## 2012-12-21 NOTE — Progress Notes (Signed)
Patient ID: [name], male   DOB: 01/29/1931, 79 y.o.   MRN: [id number]  Chief Complaint: medical management of chronic conditions   HPI:  [name] is an 79 male with past medical history significant for hypertension, diabetes, end-stage renal disease on hemodialysis (M, W,F), degenerative joint disease,  history of stroke was admitted to [facility] for syncopal event Patient seen by PT and recommended for either SNF vs home with 24 hr supervision as patient required more than 2 people for transfer therefore he was discharged to [facility] rehabilitation for need of physical therapy. He has currently been discharge from therapy due to lack of progression. Pain in hip and left knee have been a major cause for this however withincrease in medication he is too drowsy. Recently pt has become more confused with increase in temp. Tmax last night was 100.2. Denies any dizziness, headache, blurred vision, nausea, vomiting, fever, chills, chest pain, palpitations, shortness of breath, abdominal pain, bowel symptoms. He has left knee pain on movement and palpitation Review of Systems:  Review of Systems  Constitutional: Negative for malaise/fatigue.  Respiratory: Negative for cough and shortness of breath.   Cardiovascular: Negative for chest pain.  Gastrointestinal: Negative for abdominal pain, diarrhea and constipation.  Genitourinary: Negative for dysuria, urgency and frequency.  Musculoskeletal: Positive for joint pain.  Psychiatric/Behavioral: Positive for memory loss.     Medications: Patient's Medications  New Prescriptions   No medications on file  Previous Medications   CINACALCET (SENSIPAR) 30 MG TABLET    Take 30 mg by mouth 2 (two) times daily.    DIPYRIDAMOLE-ASPIRIN (AGGRENOX) 200-25 MG PER 12 HR CAPSULE    Take 1 capsule by mouth 2 (two) times daily.   LANTHANUM (FOSRENOL) 1000 MG CHEWABLE TABLET    Chew 1,000 mg by mouth 3 (three) times daily with meals.     MULTIVITAMIN (RENA-VIT)  TABS TABLET    Take 1 tablet by mouth daily.     NUTRITIONAL SUPPLEMENTS (FEEDING SUPPLEMENT, NEPRO CARB STEADY,) LIQD    Take 237 mLs by mouth 2 (two) times daily between meals.   OMEGA-3 FATTY ACIDS (FISH OIL) 1000 MG CAPS    Take 1 capsule by mouth daily.     OXYCODONE (OXY IR/ROXICODONE) 5 MG IMMEDIATE RELEASE TABLET    Take 1 tablet (5 mg total) by mouth every 8 (eight) hours as needed for pain.  Modified Medications   Modified Medication Previous Medication   GABAPENTIN (NEURONTIN) 100 MG CAPSULE gabapentin (NEURONTIN) 100 MG capsule      Take 100 mg by mouth daily.    Take 2 capsules (200 mg total) by mouth at bedtime.  Discontinued Medications   ASPIRIN 81 MG EC TABLET    Take 81 mg by mouth daily.       Physical Exam: .Physical Exam  Constitutional: He appears well-developed and well-nourished. No distress.  HENT:  Head: Normocephalic and atraumatic.  Neck: Normal range of motion. Neck supple. No thyromegaly present.  Cardiovascular: Normal rate, regular rhythm and normal heart sounds.   Pulmonary/Chest: Effort normal.  diminished breath sounds throughout  Musculoskeletal: He exhibits tenderness (left knee). He exhibits no edema.  Lymphadenopathy:    He has no cervical adenopathy.  Neurological: He is alert.  Skin: Skin is warm and dry. He is not diaphoretic.     Filed Vitals:   12/21/12 1052  BP: 98/61  Pulse: 101  Temp: 99.5 F (37.5 C)  Resp: 20  Weight: 194 lb (87.998 kg)  SpO2: 97%      Labs reviewed: Basic Metabolic Panel:  Recent Labs  16/10/96 0455 11/05/12 0430 11/08/12 1300  NA 138 137 131*  K 3.9 4.6 4.8  CL 98 96 88*  CO2 28 26 22   GLUCOSE 113* 145* 126*  BUN 34* 62* 139*  CREATININE 5.85* 8.37* 10.18*  CALCIUM 9.4 9.7 9.3  PHOS  --   --  6.0*    Liver Function Tests:  Recent Labs  11/08/12 1300  ALBUMIN 2.8*    CBC:  Recent Labs  11/03/12 1230  11/04/12 0455 11/05/12 0430 11/08/12 1300  WBC 10.5  < > 8.5 8.3 9.3   NEUTROABS 8.3*  --   --   --   --   HGB 12.2*  < > 11.4* 11.1* 10.2*  HCT 35.1*  < > 32.7* 31.9* 28.2*  MCV 82.4  < > 80.0 79.6 77.0*  PLT 321  < > 293 305 338  < > = values in this interval not displayed.      Assessment/Plan Knee pain appt made with ortho to evaluate knee and hip pain.   END STAGE RENAL DISEASE ESRD on dialysis M,W,F   Anemia of chronic disease Will get cbc with diff  Diabetes mellitus with end stage renal disease Not currently on medications A1c was previously 5.4  Acute confusion with elevation in temps- will get chest xray and UA C&S with cbc with diff

## 2012-12-21 NOTE — Assessment & Plan Note (Signed)
Will get cbc with diff

## 2012-12-21 NOTE — Telephone Encounter (Signed)
Called Dr. Thomasene Lot office again and was able to leave a message on Dr. Thomasene Lot VM asking him to return your call.  I gave him our private number here at the office as well as your cell number.

## 2012-12-21 NOTE — Telephone Encounter (Signed)
This note was printed and mailed to the patient's son, Barett Whidbee at Dr. Lianne Bushy request.

## 2012-12-27 ENCOUNTER — Inpatient Hospital Stay (HOSPITAL_COMMUNITY)
Admission: EM | Admit: 2012-12-27 | Discharge: 2013-01-10 | DRG: 871 | Disposition: A | Discharge status: Skilled Nursing Facility | Payer: Medicare Other | Attending: Internal Medicine | Admitting: Internal Medicine

## 2012-12-27 ENCOUNTER — Emergency Department (HOSPITAL_COMMUNITY): Payer: Medicare Other

## 2012-12-27 ENCOUNTER — Inpatient Hospital Stay (HOSPITAL_COMMUNITY): Payer: Medicare Other

## 2012-12-27 ENCOUNTER — Encounter (HOSPITAL_COMMUNITY): Payer: Self-pay | Admitting: Neurology

## 2012-12-27 DIAGNOSIS — R5381 Other malaise: Secondary | ICD-10-CM | POA: Diagnosis present

## 2012-12-27 DIAGNOSIS — Z992 Dependence on renal dialysis: Secondary | ICD-10-CM

## 2012-12-27 DIAGNOSIS — I10 Essential (primary) hypertension: Secondary | ICD-10-CM

## 2012-12-27 DIAGNOSIS — Z7401 Bed confinement status: Secondary | ICD-10-CM

## 2012-12-27 DIAGNOSIS — E43 Unspecified severe protein-calorie malnutrition: Secondary | ICD-10-CM

## 2012-12-27 DIAGNOSIS — Z418 Encounter for other procedures for purposes other than remedying health state: Secondary | ICD-10-CM

## 2012-12-27 DIAGNOSIS — L89154 Pressure ulcer of sacral region, stage 4: Secondary | ICD-10-CM | POA: Diagnosis present

## 2012-12-27 DIAGNOSIS — F0150 Vascular dementia without behavioral disturbance: Secondary | ICD-10-CM | POA: Diagnosis present

## 2012-12-27 DIAGNOSIS — Z515 Encounter for palliative care: Secondary | ICD-10-CM

## 2012-12-27 DIAGNOSIS — D649 Anemia, unspecified: Secondary | ICD-10-CM

## 2012-12-27 DIAGNOSIS — E1122 Type 2 diabetes mellitus with diabetic chronic kidney disease: Secondary | ICD-10-CM

## 2012-12-27 DIAGNOSIS — L89622 Pressure ulcer of left heel, stage 2: Secondary | ICD-10-CM

## 2012-12-27 DIAGNOSIS — N186 End stage renal disease: Secondary | ICD-10-CM

## 2012-12-27 DIAGNOSIS — E1129 Type 2 diabetes mellitus with other diabetic kidney complication: Secondary | ICD-10-CM | POA: Diagnosis present

## 2012-12-27 DIAGNOSIS — Z87891 Personal history of nicotine dependence: Secondary | ICD-10-CM

## 2012-12-27 DIAGNOSIS — E1142 Type 2 diabetes mellitus with diabetic polyneuropathy: Secondary | ICD-10-CM | POA: Diagnosis present

## 2012-12-27 DIAGNOSIS — L89323 Pressure ulcer of left buttock, stage 3: Secondary | ICD-10-CM

## 2012-12-27 DIAGNOSIS — E876 Hypokalemia: Secondary | ICD-10-CM | POA: Diagnosis present

## 2012-12-27 DIAGNOSIS — L8992 Pressure ulcer of unspecified site, stage 2: Secondary | ICD-10-CM

## 2012-12-27 DIAGNOSIS — R634 Abnormal weight loss: Secondary | ICD-10-CM

## 2012-12-27 DIAGNOSIS — Z8673 Personal history of transient ischemic attack (TIA), and cerebral infarction without residual deficits: Secondary | ICD-10-CM

## 2012-12-27 DIAGNOSIS — E559 Vitamin D deficiency, unspecified: Secondary | ICD-10-CM

## 2012-12-27 DIAGNOSIS — IMO0002 Reserved for concepts with insufficient information to code with codable children: Secondary | ICD-10-CM

## 2012-12-27 DIAGNOSIS — L89303 Pressure ulcer of unspecified buttock, stage 3: Secondary | ICD-10-CM

## 2012-12-27 DIAGNOSIS — L8993 Pressure ulcer of unspecified site, stage 3: Secondary | ICD-10-CM

## 2012-12-27 DIAGNOSIS — D631 Anemia in chronic kidney disease: Secondary | ICD-10-CM | POA: Diagnosis present

## 2012-12-27 DIAGNOSIS — R259 Unspecified abnormal involuntary movements: Secondary | ICD-10-CM | POA: Diagnosis present

## 2012-12-27 DIAGNOSIS — Z66 Do not resuscitate: Secondary | ICD-10-CM | POA: Diagnosis not present

## 2012-12-27 DIAGNOSIS — R55 Syncope and collapse: Secondary | ICD-10-CM

## 2012-12-27 DIAGNOSIS — I672 Cerebral atherosclerosis: Secondary | ICD-10-CM | POA: Diagnosis present

## 2012-12-27 DIAGNOSIS — L89600 Pressure ulcer of unspecified heel, unstageable: Secondary | ICD-10-CM | POA: Diagnosis present

## 2012-12-27 DIAGNOSIS — A0472 Enterocolitis due to Clostridium difficile, not specified as recurrent: Secondary | ICD-10-CM

## 2012-12-27 DIAGNOSIS — M549 Dorsalgia, unspecified: Secondary | ICD-10-CM

## 2012-12-27 DIAGNOSIS — L89309 Pressure ulcer of unspecified buttock, unspecified stage: Secondary | ICD-10-CM

## 2012-12-27 DIAGNOSIS — R972 Elevated prostate specific antigen [PSA]: Secondary | ICD-10-CM

## 2012-12-27 DIAGNOSIS — E1149 Type 2 diabetes mellitus with other diabetic neurological complication: Secondary | ICD-10-CM

## 2012-12-27 DIAGNOSIS — L89109 Pressure ulcer of unspecified part of back, unspecified stage: Secondary | ICD-10-CM | POA: Diagnosis present

## 2012-12-27 DIAGNOSIS — K921 Melena: Secondary | ICD-10-CM

## 2012-12-27 DIAGNOSIS — M1990 Unspecified osteoarthritis, unspecified site: Secondary | ICD-10-CM | POA: Diagnosis present

## 2012-12-27 DIAGNOSIS — I120 Hypertensive chronic kidney disease with stage 5 chronic kidney disease or end stage renal disease: Secondary | ICD-10-CM | POA: Diagnosis present

## 2012-12-27 DIAGNOSIS — D638 Anemia in other chronic diseases classified elsewhere: Secondary | ICD-10-CM

## 2012-12-27 DIAGNOSIS — L8994 Pressure ulcer of unspecified site, stage 4: Secondary | ICD-10-CM | POA: Diagnosis not present

## 2012-12-27 DIAGNOSIS — L89609 Pressure ulcer of unspecified heel, unspecified stage: Secondary | ICD-10-CM

## 2012-12-27 DIAGNOSIS — T82898A Other specified complication of vascular prosthetic devices, implants and grafts, initial encounter: Secondary | ICD-10-CM

## 2012-12-27 DIAGNOSIS — E41 Nutritional marasmus: Secondary | ICD-10-CM | POA: Diagnosis present

## 2012-12-27 DIAGNOSIS — D72829 Elevated white blood cell count, unspecified: Secondary | ICD-10-CM

## 2012-12-27 DIAGNOSIS — N2581 Secondary hyperparathyroidism of renal origin: Secondary | ICD-10-CM | POA: Diagnosis present

## 2012-12-27 DIAGNOSIS — A419 Sepsis, unspecified organism: Principal | ICD-10-CM

## 2012-12-27 DIAGNOSIS — Z2989 Encounter for other specified prophylactic measures: Secondary | ICD-10-CM

## 2012-12-27 DIAGNOSIS — M1712 Unilateral primary osteoarthritis, left knee: Secondary | ICD-10-CM

## 2012-12-27 DIAGNOSIS — Z79899 Other long term (current) drug therapy: Secondary | ICD-10-CM

## 2012-12-27 DIAGNOSIS — N189 Chronic kidney disease, unspecified: Secondary | ICD-10-CM | POA: Diagnosis present

## 2012-12-27 LAB — COMPREHENSIVE METABOLIC PANEL
ALT: 34 U/L (ref 0–53)
AST: 30 U/L (ref 0–37)
Albumin: 2 g/dL — ABNORMAL LOW (ref 3.5–5.2)
Alkaline Phosphatase: 75 U/L (ref 39–117)
CO2: 26 mEq/L (ref 19–32)
Chloride: 95 mEq/L — ABNORMAL LOW (ref 96–112)
Creatinine, Ser: 3.05 mg/dL — ABNORMAL HIGH (ref 0.50–1.35)
Potassium: 2.9 mEq/L — ABNORMAL LOW (ref 3.5–5.1)
Sodium: 137 mEq/L (ref 135–145)
Total Bilirubin: 0.5 mg/dL (ref 0.3–1.2)

## 2012-12-27 LAB — CBC WITH DIFFERENTIAL/PLATELET
Basophils Relative: 0 % (ref 0–1)
Eosinophils Relative: 1 % (ref 0–5)
Hemoglobin: 8.1 g/dL — ABNORMAL LOW (ref 13.0–17.0)
Lymphocytes Relative: 4 % — ABNORMAL LOW (ref 12–46)
Monocytes Relative: 6 % (ref 3–12)
Neutrophils Relative %: 89 % — ABNORMAL HIGH (ref 43–77)
Platelets: 533 10*3/uL — ABNORMAL HIGH (ref 150–400)
RBC: 2.95 MIL/uL — ABNORMAL LOW (ref 4.22–5.81)
WBC: 25.4 10*3/uL — ABNORMAL HIGH (ref 4.0–10.5)

## 2012-12-27 LAB — TROPONIN I: Troponin I: <0.3 ng/mL (ref <0.30)

## 2012-12-27 LAB — PROTIME-INR: Prothrombin Time: 14.5 seconds (ref 11.6–15.2)

## 2012-12-27 LAB — ABO/RH: ABO/RH(D): A POS

## 2012-12-27 MED ORDER — VANCOMYCIN HCL IN DEXTROSE 1-5 GM/200ML-% IV SOLN: 1000.0000 mg | INTRAVENOUS | Status: DC

## 2012-12-27 MED ORDER — LANTHANUM CARBONATE 500 MG PO CHEW
1000.0000 mg | CHEWABLE_TABLET | Freq: Three times a day (TID) | ORAL | Status: DC
  Administered 2012-12-28 – 2013-01-01 (×13): 1000 mg via ORAL
  Filled 2012-12-27 (×19): qty 2

## 2012-12-27 MED ORDER — SODIUM CHLORIDE 0.9 % IV SOLN: 1000.0000 mL | INTRAVENOUS | Status: DC

## 2012-12-27 MED ORDER — CINACALCET HCL 30 MG PO TABS
30.0000 mg | ORAL_TABLET | Freq: Two times a day (BID) | ORAL | Status: DC
  Administered 2012-12-28 – 2013-01-01 (×9): 30 mg via ORAL
  Filled 2012-12-27 (×13): qty 1

## 2012-12-27 MED ORDER — PIPERACILLIN-TAZOBACTAM 3.375 G IVPB 30 MIN: 3.3750 g | Freq: Once | INTRAVENOUS | Status: DC

## 2012-12-27 MED ORDER — NEPRO/CARBSTEADY PO LIQD
237.0000 mL | Freq: Two times a day (BID) | ORAL | Status: DC
  Administered 2012-12-28 – 2013-01-10 (×19): 237 mL via ORAL
  Filled 2012-12-27 (×4): qty 237

## 2012-12-27 MED ORDER — OMEGA-3-ACID ETHYL ESTERS 1 G PO CAPS
1.0000 g | ORAL_CAPSULE | Freq: Two times a day (BID) | ORAL | Status: DC
  Administered 2012-12-28 – 2013-01-02 (×10): 1 g via ORAL
  Filled 2012-12-27 (×13): qty 1

## 2012-12-27 MED ORDER — POTASSIUM CHLORIDE 10 MEQ/100ML IV SOLN
10.0000 meq | INTRAVENOUS | Status: AC
  Administered 2012-12-28 (×2): 10 meq via INTRAVENOUS
  Filled 2012-12-27: qty 200

## 2012-12-27 MED ORDER — SODIUM CHLORIDE 0.9 % IV BOLUS (SEPSIS): 500.0000 mL | INTRAVENOUS | Status: DC | PRN

## 2012-12-27 MED ORDER — RENA-VITE PO TABS
1.0000 | ORAL_TABLET | Freq: Every day | ORAL | Status: DC
  Administered 2012-12-28 – 2013-01-02 (×6): 1 via ORAL
  Filled 2012-12-27 (×6): qty 1

## 2012-12-27 MED ORDER — OXYCODONE HCL 5 MG PO TABS
5.0000 mg | ORAL_TABLET | Freq: Three times a day (TID) | ORAL | Status: DC | PRN
  Administered 2012-12-28 – 2013-01-05 (×7): 5 mg via ORAL
  Filled 2012-12-27 (×7): qty 1

## 2012-12-27 MED ORDER — SODIUM CHLORIDE 0.9 % IV SOLN
1000.0000 mL | INTRAVENOUS | Status: DC
  Administered 2012-12-27: 1000 mL via INTRAVENOUS

## 2012-12-27 MED ORDER — PIPERACILLIN-TAZOBACTAM IN DEX 2-0.25 GM/50ML IV SOLN
2.2500 g | Freq: Three times a day (TID) | INTRAVENOUS | Status: DC
  Administered 2012-12-28 (×2): 2.25 g via INTRAVENOUS
  Filled 2012-12-27 (×4): qty 50

## 2012-12-27 MED ORDER — GABAPENTIN 100 MG PO CAPS
100.0000 mg | ORAL_CAPSULE | Freq: Every day | ORAL | Status: DC
  Administered 2012-12-28 – 2013-01-10 (×14): 100 mg via ORAL
  Filled 2012-12-27 (×14): qty 1

## 2012-12-27 MED ORDER — VANCOMYCIN HCL IN DEXTROSE 1-5 GM/200ML-% IV SOLN: 1000.0000 mg | Freq: Once | INTRAVENOUS | Status: DC

## 2012-12-27 MED ORDER — ONDANSETRON HCL 4 MG/2ML IJ SOLN: 4.0000 mg | Freq: Once | INTRAMUSCULAR | Status: DC

## 2012-12-27 MED ORDER — ASPIRIN-DIPYRIDAMOLE ER 25-200 MG PO CP12
1.0000 | ORAL_CAPSULE | Freq: Two times a day (BID) | ORAL | Status: DC
  Administered 2012-12-28 – 2013-01-10 (×25): 1 via ORAL
  Filled 2012-12-27 (×29): qty 1

## 2012-12-27 MED ORDER — VANCOMYCIN HCL 10 G IV SOLR
1750.0000 mg | Freq: Once | INTRAVENOUS | Status: AC
  Administered 2012-12-27: 1750 mg via INTRAVENOUS
  Filled 2012-12-27: qty 1750

## 2012-12-27 NOTE — ED Notes (Signed)
EDP made aware of BP lowering. Dr. Lynelle Doctor at bedside.

## 2012-12-27 NOTE — ED Notes (Signed)
Pt denies nausea at this time  Zofran held 

## 2012-12-27 NOTE — Consult Note (Signed)
ANTIBIOTIC CONSULT NOTE - INITIAL  Pharmacy Consult for Vancomycin and Zosyn Indication: sepsis  No Known Allergies  Patient Measurements:  Height (per son) - 6'4'' Weight (per son) - 215lbs  Vital Signs: Temp: 98.6 F (37 C) (05/19 1751) Temp src: Oral (05/19 1751) BP: 104/45 mmHg (05/19 2015) Pulse Rate: 109 (05/19 2015) Intake/Output from previous day:   Intake/Output from this shift:    Labs:  Recent Labs  12/27/12 1804 12/27/12 1836  WBC 25.4*  --   HGB 8.1*  --   PLT 533*  --   CREATININE  --  3.05*   The CrCl is unknown because both a height and weight (above a minimum accepted value) are required for this calculation.   Microbiology: No results found for this or any previous visit (from the past 720 hour(s)).  Medical History: Past Medical History  Diagnosis Date  . Hypertension   . Diabetes mellitus     typeII / No meds  . Cataract 01/2002    Right  . Dialysis patient 10/13/2007  . Radiculopathy 05/14/2006    NCV study negative carpal tunnel, Left C5 radiculopathy  . Arthritis     Knee pain  . ESRD (end stage renal disease) on dialysis 2009    [facility] diaylsis Monday , Wed. and Friday per Dr. Kathrene Bongo  . Stroke   . Elevated PSA     h/o, pt had declined further eval.   . Syncope 11/02/2012   Assessment: 82yom from SNF presents to the ED with anemia (Hgb 6.4). He has ESRD and is on HD MWF (last HD session this morning). He also has leukocytosis and CXR shows possible infiltrates. He will begin empiric vancomycin and zosyn.  Goal of Therapy:  Pre-HD vancomycin level 15-25  Plan:  1) Vancomycin 1750mg  IV x 1 loading dose then 1000mg  IV qHD 2) Zosyn 2.25g IV q8 3) Follow HD schedule/tolerance and vancomycin levels as necessary  Fredrik Rigger 12/27/2012,9:16 PM

## 2012-12-27 NOTE — ED Notes (Signed)
Attempted report 

## 2012-12-27 NOTE — ED Provider Notes (Signed)
History    CSN: [id number] Arrival date & time 12/27/12  1742 First MD Initiated Contact with Patient 12/27/12 1817      Chief Complaint  Patient presents with  . Anemia    HPI Pt is a resident at [facility].  He had a routine blood draw today either at the nursing home or at dialysis.  They noted him to be anemic so he was sent to the ED.    The patient denies any bleeding.  He denies coughing up any blood to me.  No blood in the stool.  No history of bleeding problems or blood transfusion per family.  Past Medical History  Diagnosis Date  . Hypertension   . Diabetes mellitus     typeII / No meds  . Cataract 01/2002    Right  . Dialysis patient 10/13/2007  . Radiculopathy 05/14/2006    NCV study negative carpal tunnel, Left C5 radiculopathy  . Arthritis     Knee pain  . ESRD (end stage renal disease) on dialysis 2009    [facility] diaylsis Monday , Wed. and Friday per Dr. Kathrene Bongo  . Stroke   . Elevated PSA     h/o, pt had declined further eval.   . Syncope 11/02/2012    Past Surgical History  Procedure Laterality Date  . Varicose vein surgery  1978  . External fixation wrist fracture  1990  . Thyroidectomy, partial  08/03/2003    Right thyroid lobectomy, subtotal parathyr. sec hyperparthyroidismadenosis  . Av fistula placement  07/2004    Left arm AV fistula placement [name] via [place])  . Eye surgery      Cataract    Family History  Problem Relation Age of Onset  . Cancer Father 9    Lung Cancer//? stomach cancer  . Cancer Sister 30    brain tumor    History  Substance Use Topics  . Smoking status: Former Smoker -- 10 years    Types: Cigarettes    Quit date: 04/01/2002  . Smokeless tobacco: Never Used     Comment: quit ove 20 years  . Alcohol Use: No      Review of Systems  All other systems reviewed and are negative.    Allergies  Review of patient's allergies indicates no known allergies.  Home Medications    Current Outpatient Rx  Name  Route  Sig  Dispense  Refill  . cinacalcet (SENSIPAR) 30 MG tablet   Oral   Take 30 mg by mouth 2 (two) times daily.          [name] Kitchen dipyridamole-aspirin (AGGRENOX) 200-25 MG per 12 hr capsule   Oral   Take 1 capsule by mouth 2 (two) times daily.         [name] Kitchen gabapentin (NEURONTIN) 100 MG capsule   Oral   Take 100 mg by mouth daily.         [name] Kitchen lanthanum (FOSRENOL) 1000 MG chewable tablet   Oral   Chew 1,000 mg by mouth 3 (three) times daily with meals.           . multivitamin (RENA-VIT) TABS tablet   Oral   Take 1 tablet by mouth daily.           . Nutritional Supplements (FEEDING SUPPLEMENT, NEPRO CARB STEADY,) LIQD   Oral   Take 237 mLs by mouth 2 (two) times daily between meals.         . Omega-3 Fatty Acids (FISH  OIL) 1000 MG CAPS   Oral   Take 1 capsule by mouth daily.           [name] Kitchen oxyCODONE (OXY IR/ROXICODONE) 5 MG immediate release tablet   Oral   Take 1 tablet (5 mg total) by mouth every 8 (eight) hours as needed for pain.   30 tablet   0     BP 104/45  Pulse 109  Temp(Src) 98.6 F (37 C) (Oral)  Resp 32  SpO2 99%  Physical Exam  Nursing note and vitals reviewed. Constitutional: No distress.  Frail   HENT:  Head: Normocephalic and atraumatic.  Right Ear: External ear normal.  Left Ear: External ear normal.  Mucus membranes pale   Eyes: Conjunctivae are normal. Right eye exhibits no discharge. Left eye exhibits no discharge. No scleral icterus.  Neck: Neck supple. No tracheal deviation present.  Cardiovascular: Normal rate, regular rhythm and intact distal pulses.   Pulmonary/Chest: Effort normal and breath sounds normal. No stridor. No respiratory distress. He has no wheezes. He has no rales.  Abdominal: Soft. Bowel sounds are normal. He exhibits no distension. There is no tenderness. There is no rebound and no guarding.  Genitourinary:  Brown stool  Musculoskeletal: He exhibits no edema and no tenderness.   scral decubitus ulcer, no erythema, no discharge   Neurological: He is alert. No sensory deficit. Cranial nerve deficit:  no gross defecits noted. He exhibits normal muscle tone. He displays no seizure activity. Coordination normal.  General weakness  Skin: Skin is warm and dry. No rash noted.  Psychiatric: He has a normal mood and affect.    ED Course  Procedures (including critical care time)  Labs Reviewed  CBC WITH DIFFERENTIAL - Abnormal; Notable for the following:    WBC 25.4 (*)    RBC 2.95 (*)    Hemoglobin 8.1 (*)    HCT 24.3 (*)    Platelets 533 (*)    Neutrophils Relative % 89 (*)    Lymphocytes Relative 4 (*)    Neutro Abs 22.6 (*)    Monocytes Absolute 1.5 (*)    All other components within normal limits  COMPREHENSIVE METABOLIC PANEL - Abnormal; Notable for the following:    Potassium 2.9 (*)    Chloride 95 (*)    Glucose, Bld 126 (*)    Creatinine, Ser 3.05 (*)    Albumin 2.0 (*)    GFR calc non Af Amer 18 (*)    GFR calc Af Amer 21 (*)    All other components within normal limits  CULTURE, BLOOD (ROUTINE X 2)  CULTURE, BLOOD (ROUTINE X 2)  APTT  PROTIME-INR  OCCULT BLOOD, POC DEVICE  TYPE AND SCREEN  ABO/RH   No results found.   1. Anemia   2. Leukocytosis       MDM  Patient has worsening anemia as well as leukocytosis.  She does not appear to be any active bleeding at this time. His anemia may be related to his chronic renal failure.  Am uncertain as to why this would become acutely worse since his last hemoglobin back in March.  Patient does have leukocytosis. I will add on a chest x-ray to rule out pneumonia. Sent off blood cultures. He does have decubitus ulcers which potentially could be the source of this infection but at this time they are not particularly  malodorous or have any significant drainage.  Patient be admitted to the hospital for monitoring. With his chronic renal failure I  have not ordered any potassium. For his  hypokalemia        Celene Kras, MD 12/27/12 2056

## 2012-12-27 NOTE — ED Notes (Signed)
Per EMS- Pt was at dialysis where he had his normal session. Pt comes from [facility], who found his hemoglobin to be 6.4. Doctor at dialysis requesting patient be sent for evaluation.  Staff reports confusion from patient. Pt is a x 4 for EMS. Pt reports coughing up blood which was mentioned for the first time to ER staff.

## 2012-12-27 NOTE — ED Notes (Signed)
Admitting MD at bedside.

## 2012-12-27 NOTE — H&P (Addendum)
[facility]  History and Physical  [name] ZOX:[id number] DOB: 02-15-1931 DOA: 12/27/2012  Referring physician: Dr. Roselyn Bering PCP: Crawford Givens, MD   Chief Complaint: Sent from NH due to low H/H  HPI:  82 yr. Old AAM with pmhx significant for ESRD on HD M/W/F, OA, hx CVA, HTN, hx CVA, likely underlying vascular dementia, was sent from [facility] living facility due to a low H/H from baseline. The patient presents with his sister, [name] [phone]) at bedside who gives some limited history. She states he had some blood work and was found to be more anemic and sent to the ED. She denies any hx of diarrhea, cough, abdominal pain, SOB. She states he is bed bound and has known sacral/foot ulcers.  She states these are well cared for. The patient, though only oriented to self (his baseline per sister), denies any bleeding or diarrhea.   In the ED he was noted to have a HR of 120, BP 104/45, RR 24 which has increased to 32, but no fever.  He is curiously noted to be hypokalemic with a K of 2.9 (ESRD patient, last HD today).  He had a notable WBC of 25k with no bands noted. He had a negative FOBT. His H/H was 8/24, compared to last known value of 10/28.    Chart Review:  Reviewed chart.  Review of Systems:  Complete 12 point ROS is otherwise negative except for that stated in the HPI.  Past Medical History  Diagnosis Date  . Hypertension   . Diabetes mellitus     typeII / No meds  . Cataract 01/2002    Right  . Dialysis patient 10/13/2007  . Radiculopathy 05/14/2006    NCV study negative carpal tunnel, Left C5 radiculopathy  . Arthritis     Knee pain  . ESRD (end stage renal disease) on dialysis 2009    [facility] diaylsis Monday , Wed. and Friday per Dr. Kathrene Bongo  . Stroke   . Elevated PSA     h/o, pt had declined further eval.   . Syncope 11/02/2012    Past Surgical History  Procedure Laterality Date  . Varicose vein surgery  1978  . External fixation  wrist fracture  1990  . Thyroidectomy, partial  08/03/2003    Right thyroid lobectomy, subtotal parathyr. sec hyperparthyroidismadenosis  . Av fistula placement  07/2004    Left arm AV fistula placement [name] via [place])  . Eye surgery      Cataract    Social History:  reports that he quit smoking about 10 years ago. His smoking use included Cigarettes. He smoked 0.00 packs per day for 10 years. He has never used smokeless tobacco. He reports that he does not drink alcohol or use illicit drugs.  No Known Allergies  Family History  Problem Relation Age of Onset  . Cancer Father 81    Lung Cancer//? stomach cancer  . Cancer Sister 39    brain tumor     Prior to Admission medications   Medication Sig Start Date End Date Taking? Authorizing Provider  cinacalcet (SENSIPAR) 30 MG tablet Take 30 mg by mouth 2 (two) times daily.    Yes Historical Provider, MD  dipyridamole-aspirin (AGGRENOX) 200-25 MG per 12 hr capsule Take 1 capsule by mouth 2 (two) times daily. 09/19/11  Yes Joaquim Nam, MD  gabapentin (NEURONTIN) 100 MG capsule Take 100 mg by mouth daily. 11/08/12  Yes Vassie Loll, MD  lanthanum (FOSRENOL) 1000 MG  chewable tablet Chew 1,000 mg by mouth 3 (three) times daily with meals.     Yes Historical Provider, MD  multivitamin (RENA-VIT) TABS tablet Take 1 tablet by mouth daily.     Yes Historical Provider, MD  Nutritional Supplements (FEEDING SUPPLEMENT, NEPRO CARB STEADY,) LIQD Take 237 mLs by mouth 2 (two) times daily between meals. 11/08/12  Yes Vassie Loll, MD  Omega-3 Fatty Acids (FISH OIL) 1000 MG CAPS Take 1 capsule by mouth daily.     Yes Historical Provider, MD  oxyCODONE (OXY IR/ROXICODONE) 5 MG immediate release tablet Take 1 tablet (5 mg total) by mouth every 8 (eight) hours as needed for pain. 11/09/12  Yes Claudie Revering, NP   Physical Exam: Filed Vitals:   12/27/12 1751 12/27/12 1900 12/27/12 1915 12/27/12 2015  BP: 120/62 109/54 116/56 104/45  Pulse: 120 115  115 109  Temp: 98.6 F (37 C)     TempSrc: Oral     Resp: 18 23 17  32  SpO2: 98% 100% 99% 99%     General:  AAOX3, NAD. Chronically ill appearing.  Eyes: EOMI, PERRLA.  ENT: dry mucosa, no adenopathy.  Neck: supple.  Cardiovascular: tachy, S1S2, no m/r/g, reg rhythm.  Respiratory: CTA bilat.  Abdomen: Soft, NT, +BS, no guarding, no distention, no HSM.   Skin: 5x6 cm stage 3 decubitus sacral ulcer without notable drainage. Left heel with 2x3 cm stage 2 ulcer.  Psychiatric: Calm, appropriate.  Neurologic: CN II-XII intact.  UE: LUE AVF with palpable thrill and +bruit.  Wt Readings from Last 3 Encounters:  12/21/12 194 lb (87.998 kg)  11/08/12 194 lb 3.6 oz (88.1 kg)  08/31/12 204 lb (92.534 kg)    Labs on Admission:  Basic Metabolic Panel:  Recent Labs Lab 12/27/12 1836  NA 137  K 2.9*  CL 95*  CO2 26  GLUCOSE 126*  BUN 17  CREATININE 3.05*  CALCIUM 9.2    Liver Function Tests:  Recent Labs Lab 12/27/12 1836  AST 30  ALT 34  ALKPHOS 75  BILITOT 0.5  PROT 8.1  ALBUMIN 2.0*   No results found for this basename: LIPASE, AMYLASE,  in the last 168 hours No results found for this basename: AMMONIA,  in the last 168 hours  CBC:  Recent Labs Lab 12/27/12 1804  WBC 25.4*  NEUTROABS 22.6*  HGB 8.1*  HCT 24.3*  MCV 82.4  PLT 533*   Radiological Exams on Admission: Dg Chest Portable 1 View  12/27/2012   *RADIOLOGY REPORT*  Clinical Data: Anemia, weakness, end-stage renal disease on dialysis  PORTABLE CHEST - 1 VIEW  Comparison: Portable exam 2050 hours compared to 11/03/2012  Findings: Upper normal heart size. Atherosclerotic calcification aorta. Stable mediastinal contours for degree of rotation. Mild perihilar infiltrates left greater than right question asymmetric edema or infiltrate. No gross pleural effusion or pneumothorax. No acute osseous findings.  IMPRESSION: Question minimal perihilar infiltrates versus edema.   Original Report  Authenticated By: Ulyses Southward, M.D.    Principal Problem:   Sepsis Active Problems:   HYPERTENSION   End stage renal disease   Anemia of chronic disease   Decubitus ulcer of buttock, stage 3   Decubitus ulcer of heel, stage 2   Assessment/Plan 1. Sepsis: Although he has two notable pressure ulcers on admission, I don't think these are the source at this time. His CXR shows some possible perihilar infiltrates. He denies diarrhea, vomiting, though his hypokalemia is peculiar (unless his K bath was too  low on HD).  At this time, I will obtain BC, UC through I/O cath, lactic acid, and start sepsis protocol with Vanc/Zosyn. He will be bolused gently as he is noted to be more tachycardic and BP is slowly decreasing in the ED. 2. Sacral ulcer/Left foot pressure ulcer: Wound care consult. I do not suspect osteo at this time. 3. ESRD: Consult nephrology for HD. I will give 40 meq KCl at this time IV due to hypokalemia, recheck in AM. 4. Anemia chronic disease: No evidence of bleeding. Negative FOBT. Follow H/H in AM.  5. Monitor in step down, if worsens, may need PCCM admission to MICU.  Code Status: FULL Family Communication: Sister at bedside, [name], [phone]. Disposition Plan/Anticipated LOS: More than 2 midnights.  Time spent: 45 minutes  Jonah Blue, DO  [facility] Team 10   If 7PM-7AM, please contact night-coverage at [email or web address], password [facility] 12/27/2012, 9:05 PM  Repeat Hgb post bolus 6.7. Will transfuse 2 units of PRBCs. No evidence of bleeding.  Jonah Blue

## 2012-12-28 DIAGNOSIS — A0472 Enterocolitis due to Clostridium difficile, not specified as recurrent: Secondary | ICD-10-CM | POA: Diagnosis present

## 2012-12-28 LAB — PROCALCITONIN: Procalcitonin: 3.84 ng/mL

## 2012-12-28 LAB — CBC
HCT: 25.7 % — ABNORMAL LOW (ref 39.0–52.0)
Hemoglobin: 8.8 g/dL — ABNORMAL LOW (ref 13.0–17.0)
RDW: 14.5 % (ref 11.5–15.5)
WBC: 22.5 10*3/uL — ABNORMAL HIGH (ref 4.0–10.5)

## 2012-12-28 LAB — COMPREHENSIVE METABOLIC PANEL
Alkaline Phosphatase: 60 U/L (ref 39–117)
BUN: 23 mg/dL (ref 6–23)
Calcium: 9.4 mg/dL (ref 8.4–10.5)
GFR calc Af Amer: 15 mL/min — ABNORMAL LOW (ref >90)
Glucose, Bld: 106 mg/dL — ABNORMAL HIGH (ref 70–99)
Total Protein: 6.5 g/dL (ref 6.0–8.3)

## 2012-12-28 LAB — GLUCOSE, CAPILLARY
Glucose-Capillary: 97 mg/dL (ref 70–99)
Glucose-Capillary: 100 mg/dL — ABNORMAL HIGH (ref 70–99)
Glucose-Capillary: 128 mg/dL — ABNORMAL HIGH (ref 70–99)
Glucose-Capillary: 110 mg/dL — ABNORMAL HIGH (ref 70–99)

## 2012-12-28 LAB — URINE MICROSCOPIC-ADD ON

## 2012-12-28 LAB — CBC WITH DIFFERENTIAL/PLATELET
Basophils Absolute: 0.1 10*3/uL (ref 0.0–0.1)
HCT: 20.1 % — ABNORMAL LOW (ref 39.0–52.0)
Lymphocytes Relative: 6 % — ABNORMAL LOW (ref 12–46)
Neutro Abs: 17.8 10*3/uL — ABNORMAL HIGH (ref 1.7–7.7)
Platelets: 449 10*3/uL — ABNORMAL HIGH (ref 150–400)
RDW: 14.4 % (ref 11.5–15.5)
WBC: 20.6 10*3/uL — ABNORMAL HIGH (ref 4.0–10.5)

## 2012-12-28 LAB — MRSA PCR SCREENING: MRSA by PCR: NEGATIVE

## 2012-12-28 LAB — URINALYSIS, ROUTINE W REFLEX MICROSCOPIC
Bilirubin Urine: NEGATIVE
Specific Gravity, Urine: 1.011 (ref 1.005–1.030)
Urobilinogen, UA: 0.2 mg/dL (ref 0.0–1.0)

## 2012-12-28 LAB — CLOSTRIDIUM DIFFICILE BY PCR: Toxigenic C. Difficile by PCR: POSITIVE — AB

## 2012-12-28 MED ORDER — SODIUM CHLORIDE 0.9 % IV SOLN: 1000.0000 mL | INTRAVENOUS | Status: AC

## 2012-12-28 MED ORDER — SODIUM CHLORIDE 0.9 % IV SOLN: 100.0000 mL | INTRAVENOUS | Status: DC | PRN

## 2012-12-28 MED ORDER — HEPARIN SODIUM (PORCINE) 1000 UNIT/ML DIALYSIS: 1000.0000 [IU] | INTRAMUSCULAR | Status: DC | PRN

## 2012-12-28 MED ORDER — NEPRO/CARBSTEADY PO LIQD: 237.0000 mL | ORAL | Status: DC | PRN

## 2012-12-28 MED ORDER — LIDOCAINE HCL (PF) 1 % IJ SOLN: 5.0000 mL | INTRAMUSCULAR | Status: DC | PRN

## 2012-12-28 MED ORDER — HEPARIN SODIUM (PORCINE) 1000 UNIT/ML DIALYSIS: 5000.0000 [IU] | Freq: Once | INTRAMUSCULAR | Status: DC

## 2012-12-28 MED ORDER — INSULIN ASPART 100 UNIT/ML ~~LOC~~ SOLN
0.0000 [IU] | Freq: Three times a day (TID) | SUBCUTANEOUS | Status: DC
  Administered 2012-12-30 – 2013-01-08 (×14): 1 [IU] via SUBCUTANEOUS
  Administered 2013-01-09: 2 [IU] via SUBCUTANEOUS

## 2012-12-28 MED ORDER — VANCOMYCIN 50 MG/ML ORAL SOLUTION
500.0000 mg | Freq: Four times a day (QID) | ORAL | Status: DC
  Administered 2012-12-28 – 2013-01-10 (×53): 500 mg via ORAL
  Filled 2012-12-28 (×56): qty 10

## 2012-12-28 MED ORDER — LIDOCAINE-PRILOCAINE 2.5-2.5 % EX CREA: 1.0000 "application " | TOPICAL_CREAM | CUTANEOUS | Status: DC | PRN

## 2012-12-28 MED ORDER — INSULIN ASPART 100 UNIT/ML ~~LOC~~ SOLN: 0.0000 [IU] | Freq: Every day | SUBCUTANEOUS | Status: DC

## 2012-12-28 MED ORDER — METRONIDAZOLE IN NACL 5-0.79 MG/ML-% IV SOLN
500.0000 mg | Freq: Three times a day (TID) | INTRAVENOUS | Status: DC
  Administered 2012-12-28 – 2012-12-29 (×4): 500 mg via INTRAVENOUS
  Filled 2012-12-28 (×5): qty 100

## 2012-12-28 MED ORDER — PENTAFLUOROPROP-TETRAFLUOROETH EX AERO: 1.0000 "application " | INHALATION_SPRAY | CUTANEOUS | Status: DC | PRN

## 2012-12-28 MED ORDER — ALTEPLASE 2 MG IJ SOLR
2.0000 mg | Freq: Once | INTRAMUSCULAR | Status: AC | PRN
  Filled 2012-12-28: qty 2

## 2012-12-28 MED ORDER — COLLAGENASE 250 UNIT/GM EX OINT
TOPICAL_OINTMENT | Freq: Every day | CUTANEOUS | Status: DC
  Administered 2012-12-28 – 2012-12-31 (×3): via TOPICAL
  Filled 2012-12-28 (×2): qty 30

## 2012-12-28 NOTE — Progress Notes (Signed)
CRITICAL VALUE ALERT  Critical value received:  Hemoglobin 6.7  Date of notification:  12/28/12  Time of notification:  0532  Critical value read back:yes  Nurse who received alert:  Linton Flemings  MD notified (1st page): Jonah Blue  Time of first page:  0533  MD notified (2nd page):  Time of second page:  Responding MD:  Jonah Blue  Time MD responded:  [phone]

## 2012-12-28 NOTE — Progress Notes (Signed)
Pt transferred to unit [id number] from step-down. Pt is alert and oriented to staff, call bell, and room. Bed alarm on. Bed in lowest position. Call bell within reach. Full assessment to Epic. Will continue to monitor. Fayne Norrie, RN

## 2012-12-28 NOTE — Consult Note (Signed)
WOC consult Note Reason for Consult: heel and sacral wound  Wound type: R heel Deep tissue injury Pressure Ulcer POA: Yes Measurement: 6cm X 10cm Wound bed: 80% black 20% dark purple Drainage (amount, consistency, odor): No drainage or odor Periwound: Intact Dressing procedure/placement/frequency: Best practice is to leave stable black eschar in place.  Will order prevlon boot for patient to decrease pressure to heel and prevent further damage.   Wound type: Unstageable to Sacrum Pressure Ulcer POA: Yes Measurement: 10cm X 11cm X 0.25 Wound bed: 80% yellow 20% red Drainage (amount, consistency, odor): Moderate amount of yellow drainage noted to old dressing Periwound: Intact Dressing procedure/placement/frequency: Will arrange hydrotherapy to aid in debridement of necrotic tissue to promote healing.  Pt is positive for c-diff, frequent loose stools may inhibit the healing process. Will also add enzymatic debridement ointment to aid in clearing the necrotic tissue.   MD at bedside and reports currently plan is to be aggressive with the care for this patient, should that change will update plan for wound care. Paged PT for new hydrotherapy orders.  Spoke with patient's wife over the phone about complexity of pressure ulcer and that currently is has no depth, but once debridement takes place will possibly be to the sacral bone. She verbalized understanding to me and wishes currently to proceed.   Air mattress in place while in ICU, should pt transfer to medical floor will need air mattress overlay.  Wound type: arterial ulcer right great toe. Pressure Ulcer POA: Not a pressure ulcer Measurement: 1cm X 0.5cm Wound bed: 100% Black Drainage (amount, consistency, odor): No drainage, no odor present Periwound: Intact, dry Dressing procedure/placement/frequency: Best practice is to leave stable black eschar in place, patient's bilat extremities are warm to touch.   Norva Karvonen RN, MSN  Student WOC follow along with you for wound treatment and update in POC [facility] RN,CWOCN [phone]

## 2012-12-28 NOTE — Consult Note (Signed)
[name] 12/28/2012 Annalyse Langlais D Requesting Physician:  Dr Butler Denmark  Reason for Consult:  ESRD pt with [facility] and large decub ulcer HPI: The patient is a 79 y.o. year-old with hx of CVA, ESRD, DM with neuropathy. On HD x 5 years. Was admitted in March 2014 for AMS and syncopal episode felt to be related to pain meds prescribed for knee and hip DJD pain.  Was very weak and required SNF placement at [facility].  Presenting now with AMS, high WBC 26k, low BP and large sacral decub ulcer.  Patient has been in a lot of pain at the SNF per family.  Last HD was yesterday.  ROS  denies sob, cough, fever, HA  no abd pain  +n/v does not know about any diarrhea   Past Medical History:  Past Medical History  Diagnosis Date  . Hypertension   . Diabetes mellitus     typeII / No meds  . Cataract 01/2002    Right  . Dialysis patient 10/13/2007  . Radiculopathy 05/14/2006    NCV study negative carpal tunnel, Left C5 radiculopathy  . Arthritis     Knee pain  . ESRD (end stage renal disease) on dialysis 2009    [facility] diaylsis Monday , Wed. and Friday per Dr. Kathrene Bongo  . Stroke   . Elevated PSA     h/o, pt had declined further eval.   . Syncope 11/02/2012    Past Surgical History:  Past Surgical History  Procedure Laterality Date  . Varicose vein surgery  1978  . External fixation wrist fracture  1990  . Thyroidectomy, partial  08/03/2003    Right thyroid lobectomy, subtotal parathyr. sec hyperparthyroidismadenosis  . Av fistula placement  07/2004    Left arm AV fistula placement [name] via [place])  . Eye surgery      Cataract    Family History:  Family History  Problem Relation Age of Onset  . Cancer Father 47    Lung Cancer//? stomach cancer  . Cancer Sister 66    brain tumor   Social History:  reports that he quit smoking about 10 years ago. His smoking use included Cigarettes. He smoked 0.00 packs per day for 10 years. He has never used  smokeless tobacco. He reports that he does not drink alcohol or use illicit drugs.  Allergies: No Known Allergies  Home medications: Prior to Admission medications   Medication Sig Start Date End Date Taking? Authorizing Provider  cinacalcet (SENSIPAR) 30 MG tablet Take 30 mg by mouth 2 (two) times daily.    Yes Historical Provider, MD  dipyridamole-aspirin (AGGRENOX) 200-25 MG per 12 hr capsule Take 1 capsule by mouth 2 (two) times daily. 09/19/11  Yes Joaquim Nam, MD  gabapentin (NEURONTIN) 100 MG capsule Take 100 mg by mouth daily. 11/08/12  Yes Vassie Loll, MD  lanthanum (FOSRENOL) 1000 MG chewable tablet Chew 1,000 mg by mouth 3 (three) times daily with meals.     Yes Historical Provider, MD  multivitamin (RENA-VIT) TABS tablet Take 1 tablet by mouth daily.     Yes Historical Provider, MD  Nutritional Supplements (FEEDING SUPPLEMENT, NEPRO CARB STEADY,) LIQD Take 237 mLs by mouth 2 (two) times daily between meals. 11/08/12  Yes Vassie Loll, MD  Omega-3 Fatty Acids (FISH OIL) 1000 MG CAPS Take 1 capsule by mouth daily.     Yes Historical Provider, MD  oxyCODONE (OXY IR/ROXICODONE) 5 MG immediate release tablet Take 1 tablet (5 mg total)  by mouth every 8 (eight) hours as needed for pain. 11/09/12  Yes Claudie Revering, NP    Labs: Basic Metabolic Panel:  Recent Labs Lab 12/27/12 1836 12/28/12 0450  NA 137 137  K 2.9* 3.3*  CL 95* 98  CO2 26 26  GLUCOSE 126* 106*  BUN 17 23  CREATININE 3.05* 3.98*  CALCIUM 9.2 9.4   Liver Function Tests:  Recent Labs Lab 12/27/12 1836 12/28/12 0450  AST 30 27  ALT 34 27  ALKPHOS 75 60  BILITOT 0.5 0.3  PROT 8.1 6.5  ALBUMIN 2.0* 1.7*   No results found for this basename: LIPASE, AMYLASE,  in the last 168 hours No results found for this basename: AMMONIA,  in the last 168 hours CBC:  Recent Labs Lab 12/27/12 1804 12/28/12 0428  WBC 25.4* 20.6*  NEUTROABS 22.6* 17.8*  HGB 8.1* 6.7*  HCT 24.3* 20.1*  MCV 82.4 83.4  PLT 533*  449*   PT/INR: @LABRCNTIP (inr:5) Cardiac Enzymes: ) Recent Labs Lab 12/27/12 2305  TROPONINI <0.30   CBG:  Recent Labs Lab 12/27/12 2326 12/28/12 0412 12/28/12 0748  GLUCAP 100* 97 100*    Physical Exam:  Blood pressure 109/59, pulse 95, temperature 98.2 F (36.8 C), temperature source Oral, resp. rate 25, height 6\' 4"  (1.93 m), weight 91.491 kg (201 lb 11.2 oz), SpO2 100.00%.  Gen: lethargic but awakens easily HEENT:  EOMI, sclera anicteric, throat clear and dry Neck: flat neck veins, no JVD, no LAN Chest: clear bilat, no rales CV: regular, no rub or gallop, no carotid or femoral bruits, pedal pulses intact Abdomen: soft, nontender, no ascites or HSM Back: very large unstageable sacral area decub ulcer Ext: 1+ pretibial edema bilat, no joint effusion or deformity Neuro: alert, oriented to self only, "I'm at dialysis", doesn't know the date or year Access: L forearm AVF +bruit  [facility]  3hr 45 min  2K/2CA [place]  LFA AVF   Impression/Plan 1. AMS / [facility] - +for Cdif, also large sacral decub.  Per primary 2. ESRD, cont HD MWF.  Outpatient HD is going to be a big problem with this decubitus ulcer 3. HTN/volume- no vol excess, BP soft. No UF w HD tomorrow 4. Anemia of CKD- getting blood, will get EPO status from center 5. Secondary HPTH- get records 6. EOL - spoke with son [name] and wife [name] about level of aggressiveness, code status, etc.  They are considering code status and also issues about pain and QOL.   [name]  MD [employer] [phone] pgr    [phone] cell 12/28/2012, 9:47 AM

## 2012-12-28 NOTE — Progress Notes (Signed)
[facility] Progress Note  TEAM 1 - Stepdown/ICU TEAM   [name] ZOX:[id number] DOB: 07/03/1931 DOA: 12/27/2012 PCP: Crawford Givens, MD  Brief narrative: 79 year old male patient with chronic kidney disease on dialysis also prior history of CVA and diabetes. Said from [place] skilled nursing facility because of hemoglobin lower than baseline. Patient's sister came with the patient he was able to give limited history. No apparent other symptomatology prior to arrival such as diarrhea cough or abdominal pain or shortness of breath. He is known to be bed bound and has multiple decubiti. She reports these are well cared for at the nursing facility. In the emergency department he was noted to be borderline hypotensive with a systolic blood pressure 104 respiratory 24 and tachycardic with heart rate of 120 he was afebrile. His potassium was low at 2.9 and leukocytosis of 25,000 fecal occult blood was negative baseline hemoglobin 10 and presentation hemoglobin 8.  Assessment/Plan: Principal Problem:   Sepsis  -primarily 2/2 colitis but need to FU on all cx's -BP better-did receive IVF's briefly -PCT 3.84 with normal lactic acid  Active Problems:   Clostridium difficile colitis -found to have diarrhea after admit/positive for c.diff -begin oral Vancomycin and IV Flagyl -no other obvious sources of infection so dc anbx's but need to FU on cx's to ensure no concurrent bacterial infections    Diabetes mellitus with end stage renal disease -cbg well controlled -SSI    HYPERTENSION -BP controlled and NOT on meds pre admit    CKD (chronic kidney disease) stage V requiring chronic dialysis -per nephrology -Dr. Antonietta Jewel spoke with family today who explained pt with rapid decline over past few months and are exploring as a family goals of care- they may make pt DNR and possibly withdraw HD and focus on comfort    Anemia of chronic disease -PRBC's for hgb 8- baseline 10     History of CVA (cerebrovascular accident) -with vascular dementia -return to SNF when medically stable    Decubitus ulcer of buttock, stage 3/Decubitus ulcer of heel, stage 2   -WOC RN following with wound care recs made -air mattress   DVT prophylaxis: SCDs Code Status: Full code Family Communication: Patient only-nephrologist recounted conversation with family this morning this documented Disposition Plan: Transfer to renal floor Isolation: Contact isolation for C. difficile-positive status  Consultants: Nephrology  Procedures: None  Antibiotics: Zosyn 5/19 >>> 5/20 Vancomycin 5/19 >>> 5/20 Oral vancomycin 5/20 >>> IV Flagyl 5/20 >>>  HPI/Subjective: Patient awake but clearly confused. Denies any specific complaints. Unable to provide adequate history.   Objective: Blood pressure 105/67, pulse 99, temperature 98.7 F (37.1 C), temperature source Axillary, resp. rate 27, height 6\' 4"  (1.93 m), weight 91.491 kg (201 lb 11.2 oz), SpO2 99.00%.  Intake/Output Summary (Last 24 hours) at 12/28/12 1243 Last data filed at 12/28/12 1200  Gross per 24 hour  Intake   2225 ml  Output    600 ml  Net   1625 ml     Exam: General: No acute respiratory distress Lungs: Clear to auscultation bilaterally without wheezes or crackles, RA Cardiovascular: Regular rate and rhythm without murmur gallop or rub normal S1 and S2, no peripheral edema or JVD Abdomen: Nontender, nondistended, soft, bowel sounds positive, no rebound, no ascites, no appreciable mass, diarrhea Musculoskeletal: No significant cyanosis, clubbing of bilateral lower extremities Neurological: Alert and oriented x name, moves all extremities x 4 without focal neurological deficits but weak estimated at 3/5 bilaterally, CN 2-12 intact  Data Reviewed: Basic Metabolic Panel:  Recent Labs Lab 12/27/12 1836 12/28/12 0450  NA 137 137  K 2.9* 3.3*  CL 95* 98  CO2 26 26  GLUCOSE 126* 106*  BUN 17 23  CREATININE  3.05* 3.98*  CALCIUM 9.2 9.4   Liver Function Tests:  Recent Labs Lab 12/27/12 1836 12/28/12 0450  AST 30 27  ALT 34 27  ALKPHOS 75 60  BILITOT 0.5 0.3  PROT 8.1 6.5  ALBUMIN 2.0* 1.7*   No results found for this basename: LIPASE, AMYLASE,  in the last 168 hours No results found for this basename: AMMONIA,  in the last 168 hours CBC:  Recent Labs Lab 12/27/12 1804 12/28/12 0428  WBC 25.4* 20.6*  NEUTROABS 22.6* 17.8*  HGB 8.1* 6.7*  HCT 24.3* 20.1*  MCV 82.4 83.4  PLT 533* 449*   Cardiac Enzymes:  Recent Labs Lab 12/27/12 2305  TROPONINI <0.30   BNP (last 3 results) No results found for this basename: PROBNP,  in the last 8760 hours CBG:  Recent Labs Lab 12/27/12 2326 12/28/12 0412 12/28/12 0748 12/28/12 1155  GLUCAP 100* 97 100* 128*    Recent Results (from the past 240 hour(s))  MRSA PCR SCREENING     Status: None   Collection Time    12/27/12 10:50 PM      Result Value Range Status   MRSA by PCR NEGATIVE  NEGATIVE Final   Comment:            The GeneXpert MRSA Assay (FDA     approved for NASAL specimens     only), is one component of a     comprehensive MRSA colonization     surveillance program. It is not     intended to diagnose MRSA     infection nor to guide or     monitor treatment for     MRSA infections.  CLOSTRIDIUM DIFFICILE BY PCR     Status: Abnormal   Collection Time    12/28/12  1:26 AM      Result Value Range Status   C difficile by pcr POSITIVE (*) NEGATIVE Final   Comment: CRITICAL RESULT CALLED TO, READ BACK BY AND VERIFIED WITH:     J RESUELLO,RN 12/28/12 0850 BY K SCHULTZ     Studies:  Recent x-ray studies have been reviewed in detail by the Attending Physician  Scheduled Meds:  Reviewed in detail by the Attending Physician   Junious Silk, ANP [facility] Office  [phone] Pager [phone]  On-Call/Text Page:      [email or web address]      password [email or web address]  If 7PM-7AM, please contact  night-coverage [email or web address] Password [facility] 12/28/2012, 12:43 PM   LOS: 1 day   I have examined the patient, reviewed the chart and modified the above note which I agree with.   Laurna Shetley,MD [phone] 12/28/2012, 4:56 PM

## 2012-12-28 NOTE — Progress Notes (Signed)
Transferred to 6734 via bed accompanied by NA. On room air, and no untoward signs and symptoms noted. Receiving RN at bedside. No untoward event happened during transport.

## 2012-12-28 NOTE — Progress Notes (Signed)
Physical Therapy Wound Evaluation Patient Details  Name: [name] MRN: [id number] Date of Birth: [birth year]  Today's Date: 12/28/2012 Time:  -     Subjective  Subjective: It's going to be hurting huh? Patient and Family Stated Goals: not stated Date of Onset:  (prior to admission)  Pain Score: Pain Score: 0-No pain  Wound Assessment  Pressure Ulcer 12/27/12 Unstageable - Full thickness tissue loss in which the base of the ulcer is covered by slough (yellow, tan, gray, green or brown) and/or eschar (tan, brown or black) in the wound bed. (Active)  State of Healing Eschar 12/28/2012 12:22 PM  Site / Wound Assessment Black;Yellow;Pale;Pink 12/28/2012 12:22 PM  % Wound base Red or Granulating 20% 12/28/2012 12:22 PM  % Wound base Yellow 20% 12/28/2012 12:22 PM  % Wound base Black 60% 12/28/2012 12:22 PM  Peri-wound Assessment Pink;Erythema (non-blanchable);Black 12/28/2012 12:22 PM  Wound Length (cm) 9.8 cm 12/28/2012 12:22 PM  Wound Width (cm) 10 cm 12/28/2012 12:22 PM  Wound Depth (cm) 2 cm 12/28/2012 12:22 PM  Margins Unattacted edges (unapproximated) 12/28/2012 12:22 PM  Drainage Amount None 12/28/2012 12:22 PM  Drainage Description Odor;Other (Comment) 12/28/2012 12:22 PM  Treatment Debridement (Selective);Hydrotherapy (Pulse lavage) 12/28/2012 12:22 PM  Dressing Type ABD;Gauze (Comment);Moist to dry 12/28/2012 12:22 PM  Dressing Clean;Dry;Intact 12/28/2012 12:22 PM     Wound 12/27/12 Other (Comment) Heel Right (Active)  Site / Wound Assessment Clean;Dry 12/28/2012  8:00 AM  Peri-wound Assessment Intact;Black 12/28/2012  8:00 AM  Margins Attached edges (approximated) 12/28/2012  8:00 AM  Drainage Amount None 12/28/2012  8:00 AM  Dressing Type None 12/28/2012  8:00 AM     Wound 12/27/12 Other (Comment) Toe (Comment  which one) Right (Active)  Site / Wound Assessment Black;Purple 12/28/2012  8:00 AM  Peri-wound Assessment Intact 12/28/2012  8:00 AM  Margins Attached edges (approximated)  12/28/2012  8:00 AM  Drainage Amount None 12/28/2012  8:00 AM  Dressing Type None 12/28/2012  8:00 AM     Incision 04/13/12 Arm Left (Active)   Hydrotherapy Pulsed lavage therapy - wound location: sacrum Pulsed Lavage with Suction (psi): 4 psi (to 12) Pulsed Lavage with Suction - Normal Saline Used: 1000 mL Pulsed Lavage Tip: Tip with splash shield Selective Debridement Selective Debridement - Location: sacrum Selective Debridement - Tools Used: Scalpel;Forceps Selective Debridement - Tissue Removed: eschar and necrotic dermal tissue   Wound Assessment and Plan  Wound Therapy - Assess/Plan/Recommendations Wound Therapy - Clinical Statement: Hope to selectively debride the necrotic tissues away to promote granulation to this pressure ulcer. Wound Therapy - Functional Problem List: immobility Factors Delaying/Impairing Wound Healing: Diabetes Mellitus;Altered sensation;Immobility Hydrotherapy Plan: Debridement;Dressing change;Patient/family education;Pulsatile lavage with suction;Ultrasonic wound therapy @35  KHz (+/- 3 KHz) Wound Therapy - Frequency: 6X / week Wound Therapy - Follow Up Recommendations: Skilled nursing facility Wound Plan: decr. bacterial load and selectively debide to healthy tissues  Wound Therapy Goals- Improve the function of patient's integumentary system by progressing the wound(s) through the phases of wound healing (inflammation - proliferation - remodeling) by: Decrease Necrotic Tissue to: 10 Decrease Necrotic Tissue - Progress: Goal set today Increase Granulation Tissue to: 100 Increase Granulation Tissue - Progress: Goal set today Decrease Length/Width/Depth by (cm): .3 Decrease Length/Width/Depth - Progress: Goal set today Improve Drainage Characteristics: Serous Improve Drainage Characteristics - Progress: Goal set today Goals/treatment plan/discharge plan were made with and agreed upon by patient/family: Yes Time For Goal Achievement: 2 weeks Wound  Therapy - Potential for Goals: Good  Goals  will be updated until maximal potential achieved or discharge criteria met.  Discharge criteria: when goals achieved, discharge from hospital, MD decision/surgical intervention, no progress towards goals, refusal/missing three consecutive treatments without notification or medical reason.  GP     Hertha Gergen, [name] 12/28/2012, 12:33 PM  12/28/2012  Shenandoah Bing, PT [phone]  (pager)

## 2012-12-29 DIAGNOSIS — A0472 Enterocolitis due to Clostridium difficile, not specified as recurrent: Secondary | ICD-10-CM

## 2012-12-29 DIAGNOSIS — D649 Anemia, unspecified: Secondary | ICD-10-CM

## 2012-12-29 LAB — TYPE AND SCREEN
Antibody Screen: NEGATIVE
Unit division: 0

## 2012-12-29 LAB — GLUCOSE, CAPILLARY: Glucose-Capillary: 124 mg/dL — ABNORMAL HIGH (ref 70–99)

## 2012-12-29 LAB — BASIC METABOLIC PANEL
BUN: 37 mg/dL — ABNORMAL HIGH (ref 6–23)
Calcium: 9.5 mg/dL (ref 8.4–10.5)
Creatinine, Ser: 5.49 mg/dL — ABNORMAL HIGH (ref 0.50–1.35)
GFR calc Af Amer: 10 mL/min — ABNORMAL LOW (ref >90)
GFR calc non Af Amer: 9 mL/min — ABNORMAL LOW (ref >90)
Glucose, Bld: 122 mg/dL — ABNORMAL HIGH (ref 70–99)

## 2012-12-29 LAB — CBC
HCT: 23 % — ABNORMAL LOW (ref 39.0–52.0)
Hemoglobin: 7.8 g/dL — ABNORMAL LOW (ref 13.0–17.0)
MCH: 27.9 pg (ref 26.0–34.0)
MCHC: 33.9 g/dL (ref 30.0–36.0)
RDW: 14.5 % (ref 11.5–15.5)

## 2012-12-29 LAB — URINE CULTURE

## 2012-12-29 LAB — HEMOGLOBIN A1C
Hgb A1c MFr Bld: 5.5 % (ref <5.7)
Mean Plasma Glucose: 111 mg/dL (ref <117)

## 2012-12-29 MED ORDER — PRO-STAT SUGAR FREE PO LIQD
30.0000 mL | Freq: Three times a day (TID) | ORAL | Status: DC
  Administered 2012-12-29 – 2013-01-01 (×10): 30 mL via ORAL
  Filled 2012-12-29 (×12): qty 30

## 2012-12-29 MED ORDER — POTASSIUM CHLORIDE CRYS ER 20 MEQ PO TBCR
40.0000 meq | EXTENDED_RELEASE_TABLET | Freq: Once | ORAL | Status: AC
  Administered 2012-12-29: 40 meq via ORAL
  Filled 2012-12-29: qty 2

## 2012-12-29 MED ORDER — METRONIDAZOLE 500 MG PO TABS
500.0000 mg | ORAL_TABLET | Freq: Three times a day (TID) | ORAL | Status: DC
  Administered 2012-12-29 – 2012-12-31 (×6): 500 mg via ORAL
  Filled 2012-12-29 (×8): qty 1

## 2012-12-29 NOTE — Progress Notes (Signed)
[facility]    Ahmed Inniss ZOX:[id number] DOB: 03-28-1931 DOA: 12/27/2012 PCP: Crawford Givens, MD  Brief narrative: 79 year old male patient with chronic kidney disease on dialysis also prior history of CVA and diabetes. Sent from [place] skilled nursing facility because of hemoglobin lower than baseline. Patient's sister came with the patient he was able to give limited history. No apparent other symptomatology prior to arrival such as diarrhea cough or abdominal pain or shortness of breath. He is known to be bed bound and has multiple decubiti. She reports these are well cared for at the nursing facility. In the emergency department he was noted to be borderline hypotensive with a systolic blood pressure 104 respiratory 24 and tachycardic with heart rate of 120 he was afebrile. His potassium was low at 2.9 and leukocytosis of 25,000 fecal occult blood was negative baseline hemoglobin 10 and presentation hemoglobin 8.  Assessment/Plan: Principal Problem:   Sepsis  -primarily 2/2 colitis but need to FU on all cx's -BP better-did receive IVF's briefly -PCT 3.84 with normal lactic acid - resolved      Clostridium difficile colitis - severe type  Probably the source for sepsis.  -on oral Vancomycin and IV Flagyl since 5/20 - patient with copious liquid diarrhea - placed flexiseal 12/29/12     Diabetes mellitus with end stage renal disease -cbg well controlled -SSI    HYPERTENSION -BP controlled and NOT on meds pre admit    CKD (chronic kidney disease) stage V requiring chronic dialysis -per nephrology. Had HD 12/29/12  -Dr. Arlean Hopping spoke with family on 12/28/12 and explained pt with rapid decline over past few months and are exploring as a family goals of care- they may make pt DNR and possibly withdraw HD and focus on comfort    Anemia of chronic disease -PRBC's for hgb 8- baseline 10    History of CVA (cerebrovascular accident) -with vascular dementia -return to SNF when  medically stable    Decubitus ulcer of buttock, stage 3/Decubitus ulcer of heel, stage 2   -undergoing hydrotherapy.  -air mattress  Hypokalemia from severe diarrhea - repleted  DVT prophylaxis: SCDs Code Status: Full code Family Communication:patient  Disposition Plan: SNF  Isolation: Contact isolation for C. difficile-positive status  Consultants: Nephrology  Procedures: None  Antibiotics: Zosyn 5/19 >>> 5/20 Vancomycin 5/19 >>> 5/20 Oral vancomycin 5/20 >>> IV Flagyl 5/20 >>>  HPI/Subjective: Patient awake. Having diarrhea - undergoing hydrotherapy.    Objective: Blood pressure 99/58, pulse 86, temperature 98.1 F (36.7 C), temperature source Oral, resp. rate 20, height 6\' 4"  (1.93 m), weight 81.3 kg (179 lb 3.7 oz), SpO2 95.00%.  Intake/Output Summary (Last 24 hours) at 12/29/12 0921 Last data filed at 12/28/12 2300  Gross per 24 hour  Intake 1277.5 ml  Output      0 ml  Net 1277.5 ml    Patient Vitals for the past 24 hrs:  BP Temp Temp src Pulse Resp SpO2 Weight  12/29/12 1306 114/57 mmHg 98.1 F (36.7 C) Oral 96 18 96 % -  12/29/12 1139 113/55 mmHg 97.3 F (36.3 C) Oral 95 18 100 % -  12/29/12 1048 - 97.8 F (36.6 C) Oral - 18 97 % 81.1 kg (178 lb 12.7 oz)  12/29/12 1030 106/57 mmHg - - 87 - - -  12/29/12 1000 103/56 mmHg - - 89 - - -  12/29/12 0930 94/54 mmHg - - 87 - - -  12/29/12 0900 99/58 mmHg - - 86 - - -  12/29/12 0830 101/56 mmHg - - 87 - - -  12/29/12 0800 89/47 mmHg - - 86 - - -  12/29/12 0730 95/54 mmHg - - 81 - - -  12/29/12 0655 89/51 mmHg - - 83 20 - -  12/29/12 0637 81/59 mmHg - Oral - 20 - 81.3 kg (179 lb 3.7 oz)  12/29/12 0430 114/49 mmHg 98.1 F (36.7 C) Oral 103 19 95 % -  12/28/12 1900 130/63 mmHg 98.4 F (36.9 C) Oral 98 19 100 % -  12/28/12 1623 127/64 mmHg 98.8 F (37.1 C) - 96 19 96 % -     Exam: General: No acute respiratory distress Lungs: Clear to auscultation bilaterally without wheezes or crackles,  RA Cardiovascular: Regular rate and rhythm without murmur gallop or rub normal S1 and S2, no peripheral edema or JVD Abdomen: Nontender, nondistended, soft, bowel sounds positive, no rebound, no ascites, no appreciable mass, diarrhea Has deep unstageable wound on buttock  Data Reviewed: Basic Metabolic Panel:  Recent Labs Lab 12/27/12 1836 12/28/12 0450 12/29/12 0658  NA 137 137 135  K 2.9* 3.3* 2.9*  CL 95* 98 97  CO2 26 26 25   GLUCOSE 126* 106* 122*  BUN 17 23 37*  CREATININE 3.05* 3.98* 5.49*  CALCIUM 9.2 9.4 9.5   Liver Function Tests:  Recent Labs Lab 12/27/12 1836 12/28/12 0450  AST 30 27  ALT 34 27  ALKPHOS 75 60  BILITOT 0.5 0.3  PROT 8.1 6.5  ALBUMIN 2.0* 1.7*   No results found for this basename: LIPASE, AMYLASE,  in the last 168 hours No results found for this basename: AMMONIA,  in the last 168 hours CBC:  Recent Labs Lab 12/27/12 1804 12/28/12 0428 12/28/12 1757 12/29/12 0658  WBC 25.4* 20.6* 22.5* 21.5*  NEUTROABS 22.6* 17.8*  --   --   HGB 8.1* 6.7* 8.8* 7.8*  HCT 24.3* 20.1* 25.7* 23.0*  MCV 82.4 83.4 82.4 82.1  PLT 533* 449* 445* 423*   Cardiac Enzymes:  Recent Labs Lab 12/27/12 2305  TROPONINI <0.30   BNP (last 3 results) No results found for this basename: PROBNP,  in the last 8760 hours CBG:  Recent Labs Lab 12/28/12 0748 12/28/12 1155 12/28/12 1442 12/28/12 1623 12/28/12 2058  GLUCAP 100* 128* 152* 110* 118*    Recent Results (from the past 240 hour(s))  CULTURE, BLOOD (ROUTINE X 2)     Status: None   Collection Time    12/27/12  8:55 PM      Result Value Range Status   Specimen Description BLOOD HAND RIGHT   Final   Special Requests BOTTLES DRAWN AEROBIC AND ANAEROBIC 6CC   Final   Culture  Setup Time 12/28/2012 01:49   Final   Culture     Final   Value:        BLOOD CULTURE RECEIVED NO GROWTH TO DATE CULTURE WILL BE HELD FOR 5 DAYS BEFORE ISSUING A FINAL NEGATIVE REPORT   Report Status PENDING   Incomplete   CULTURE, BLOOD (ROUTINE X 2)     Status: None   Collection Time    12/27/12  9:00 PM      Result Value Range Status   Specimen Description BLOOD ARM RIGHT   Final   Special Requests BOTTLES DRAWN AEROBIC ONLY 5CC   Final   Culture  Setup Time 12/28/2012 01:51   Final   Culture     Final   Value:  BLOOD CULTURE RECEIVED NO GROWTH TO DATE CULTURE WILL BE HELD FOR 5 DAYS BEFORE ISSUING A FINAL NEGATIVE REPORT   Report Status PENDING   Incomplete  MRSA PCR SCREENING     Status: None   Collection Time    12/27/12 10:50 PM      Result Value Range Status   MRSA by PCR NEGATIVE  NEGATIVE Final   Comment:            The GeneXpert MRSA Assay (FDA     approved for NASAL specimens     only), is one component of a     comprehensive MRSA colonization     surveillance program. It is not     intended to diagnose MRSA     infection nor to guide or     monitor treatment for     MRSA infections.  URINE CULTURE     Status: None   Collection Time    12/28/12 12:05 AM      Result Value Range Status   Specimen Description URINE, CATHETERIZED   Final   Special Requests NONE   Final   Culture  Setup Time 12/28/2012 04:58   Final   Colony Count NO GROWTH   Final   Culture NO GROWTH   Final   Report Status 12/29/2012 FINAL   Final  CLOSTRIDIUM DIFFICILE BY PCR     Status: Abnormal   Collection Time    12/28/12  1:26 AM      Result Value Range Status   C difficile by pcr POSITIVE (*) NEGATIVE Final   Comment: CRITICAL RESULT CALLED TO, READ BACK BY AND VERIFIED WITH:     J RESUELLO,RN 12/28/12 0850 BY K SCHULTZ     Studies:  Recent x-ray studies have been reviewed in detail by the Attending Physician  Scheduled Meds:  . cinacalcet  30 mg Oral BID WC  . collagenase   Topical Daily  . dipyridamole-aspirin  1 capsule Oral BID  . feeding supplement (NEPRO CARB STEADY)  237 mL Oral BID BM  . feeding supplement  30 mL Oral TID WC  . gabapentin  100 mg Oral Daily  . insulin aspart  0-5  Units Subcutaneous QHS  . insulin aspart  0-9 Units Subcutaneous TID WC  . lanthanum  1,000 mg Oral TID WC  . metroNIDAZOLE  500 mg Oral Q8H  . multivitamin  1 tablet Oral Daily  . omega-3 acid ethyl esters  1 g Oral BID  . ondansetron (ZOFRAN) IV  4 mg Intravenous Once  . potassium chloride  40 mEq Oral Once  . vancomycin  500 mg Oral Q6H      Verbie Babic,MD [phone] 12/29/2012, 9:21 AM

## 2012-12-29 NOTE — Progress Notes (Addendum)
INITIAL NUTRITION ASSESSMENT  DOCUMENTATION CODES Per approved criteria  -Severe malnutrition in the context of chronic illness   INTERVENTION: 1. 30 ml Prostat po TID, each supplement provides 100 kcal and 15 grams protein. 2. Continue Nepro Shakes 3. RD to continue to follow nutrition care plan  NUTRITION DIAGNOSIS: Increased nutrient needs related to advanced stage wounds as evidenced by estimated needs.   Goal: Intake to meet >90% of estimated nutrition needs.  Monitor:  weight trends, lab trends, I/O's, PO intake, supplement tolerance  Reason for Assessment: Progression Rounds  79 y.o. male  Admitting Dx: Sepsis  ASSESSMENT: PMHx significant for ESRD on HD MWF, OA, CVA, HTN, dementia. From [facility]; pt bedbound at baseline. MD at HD sent pt to ED 2/2 low Hgb.  Per most recent MD note, renal MD spoke with family yesterday and are possibly exploring goals of care; ?GOC.  Pt with ongoing wt loss - per [facility] records, pt has 17% wt loss x 4 months. Unable to complete Physical Assessment at this time - pt currently in HD. Per chart, pt's family has reported that pt has been in a significant amount of pain at SNF as well as n/v. Pt meets criteria for severe MALNUTRITION in the context of chronic illness as evidenced by 17% wt loss x 4 months and intake of <75% x at least 1 month.   Height: Ht Readings from Last 1 Encounters:  12/28/12 6\' 4"  (1.93 m)    Weight: Wt Readings from Last 1 Encounters:  12/29/12 179 lb 3.7 oz (81.3 kg)    Ideal Body Weight: 202 lb  % Ideal Body Weight: 89%  Wt Readings from Last 10 Encounters:  12/29/12 179 lb 3.7 oz (81.3 kg)  12/21/12 194 lb (87.998 kg)  11/08/12 194 lb 3.6 oz (88.1 kg)  08/31/12 204 lb (92.534 kg)  04/29/12 209 lb (94.802 kg)  04/09/12 215 lb (97.523 kg)  04/09/12 215 lb (97.523 kg)  04/01/12 215 lb (97.523 kg)  02/03/12 215 lb (97.523 kg)  01/23/11 220 lb 6 oz (99.961 kg)    Usual Body Weight: 210 - 220  lb  % Usual Body Weight: 83%  BMI:  Body mass index is 21.83 kg/(m^2). WNL  Estimated Nutritional Needs: Kcal: 2100 - 2400 Protein: 120 - 135 grams Fluid: 1.2 liters  Skin:  DTI to R heel Unstageable to sacrum  Diet Order: Carb Control Medium   EDUCATION NEEDS: -No education needs identified at this time   Intake/Output Summary (Last 24 hours) at 12/29/12 1044 Last data filed at 12/28/12 2300  Gross per 24 hour  Intake 1277.5 ml  Output      0 ml  Net 1277.5 ml    Last BM: 5/20  Labs:   Recent Labs Lab 12/27/12 1836 12/28/12 0450 12/29/12 0658  NA 137 137 135  K 2.9* 3.3* 2.9*  CL 95* 98 97  CO2 26 26 25   BUN 17 23 37*  CREATININE 3.05* 3.98* 5.49*  CALCIUM 9.2 9.4 9.5  GLUCOSE 126* 106* 122*    CBG (last 3)   Recent Labs  12/28/12 1442 12/28/12 1623 12/28/12 2058  GLUCAP 152* 110* 118*    Scheduled Meds: . cinacalcet  30 mg Oral BID WC  . collagenase   Topical Daily  . dipyridamole-aspirin  1 capsule Oral BID  . feeding supplement (NEPRO CARB STEADY)  237 mL Oral BID BM  . gabapentin  100 mg Oral Daily  . heparin  5,000 Units Dialysis  Once in dialysis  . insulin aspart  0-5 Units Subcutaneous QHS  . insulin aspart  0-9 Units Subcutaneous TID WC  . lanthanum  1,000 mg Oral TID WC  . vancomycin  500 mg Oral Q6H   And  . metronidazole  500 mg Intravenous Q8H  . multivitamin  1 tablet Oral Daily  . omega-3 acid ethyl esters  1 g Oral BID  . ondansetron (ZOFRAN) IV  4 mg Intravenous Once    Continuous Infusions:   Past Medical History  Diagnosis Date  . Hypertension   . Diabetes mellitus     typeII / No meds  . Cataract 01/2002    Right  . Dialysis patient 10/13/2007  . Radiculopathy 05/14/2006    NCV study negative carpal tunnel, Left C5 radiculopathy  . Arthritis     Knee pain  . ESRD (end stage renal disease) on dialysis 2009    [facility] diaylsis Monday , Wed. and Friday per Dr. Kathrene Bongo  . Stroke   .  Elevated PSA     h/o, pt had declined further eval.   . Syncope 11/02/2012    Past Surgical History  Procedure Laterality Date  . Varicose vein surgery  1978  . External fixation wrist fracture  1990  . Thyroidectomy, partial  08/03/2003    Right thyroid lobectomy, subtotal parathyr. sec hyperparthyroidismadenosis  . Av fistula placement  07/2004    Left arm AV fistula placement [name] via [place])  . Eye surgery      Cataract    Jarold Motto MS, RD, LDN Pager: [phone] After-hours pager: [phone]

## 2012-12-29 NOTE — Progress Notes (Signed)
Physical Therapy Wound Treatment Patient Details  Name: [name] MRN: [id number] Date of Birth: 1931-07-19  Today's Date: 12/29/2012 Time:  -     Subjective  Subjective: Francesca Oman got to tell me that you're coming after HD Patient and Family Stated Goals: not stated Date of Onset:  (prior to admission)  Pain Score:    Wound Assessment  Pressure Ulcer 12/27/12 Unstageable - Full thickness tissue loss in which the base of the ulcer is covered by slough (yellow, tan, gray, green or brown) and/or eschar (tan, brown or black) in the wound bed. (Active)  State of Healing Eschar 12/29/2012  3:00 PM  Site / Wound Assessment Black;Yellow;Pale;Pink 12/29/2012  3:00 PM  % Wound base Red or Granulating 20% 12/29/2012  3:00 PM  % Wound base Yellow 20% 12/29/2012  3:00 PM  % Wound base Black 60% 12/29/2012  3:00 PM  Peri-wound Assessment Pink;Erythema (non-blanchable);Black 12/29/2012  3:00 PM  Wound Length (cm) 9.8 cm 12/28/2012 12:22 PM  Wound Width (cm) 10 cm 12/28/2012 12:22 PM  Wound Depth (cm) 2 cm 12/28/2012 12:22 PM  Margins Unattacted edges (unapproximated) 12/29/2012  3:00 PM  Drainage Amount None 12/29/2012  3:00 PM  Drainage Description Odor;Other (Comment) 12/29/2012  3:00 PM  Treatment Debridement (Selective);Hydrotherapy (Pulse lavage) 12/29/2012  3:00 PM  Dressing Type ABD;Gauze (Comment);Moist to dry 12/29/2012  3:00 PM  Dressing Clean;Dry;Intact 12/29/2012  3:00 PM     Wound 12/27/12 Other (Comment) Heel Right (Active)  Site / Wound Assessment Clean;Dry 12/28/2012  2:25 PM  Peri-wound Assessment Intact;Black 12/28/2012  2:25 PM  Margins Attached edges (approximated) 12/28/2012  2:25 PM  Drainage Amount None 12/28/2012  2:25 PM  Dressing Type None 12/28/2012  2:25 PM     Wound 12/27/12 Other (Comment) Toe (Comment  which one) Right (Active)  Site / Wound Assessment Black;Purple 12/29/2012 10:48 AM  Peri-wound Assessment Intact 12/29/2012 10:48 AM  Margins Attached edges (approximated) 12/29/2012  10:48 AM  Drainage Amount None 12/29/2012 10:48 AM  Dressing Type None 12/29/2012 10:48 AM     Incision 04/13/12 Arm Left (Active)   Hydrotherapy Pulsed lavage therapy - wound location: sacrum Pulsed Lavage with Suction (psi): 4 psi (to 12) Pulsed Lavage with Suction - Normal Saline Used: 1000 mL Pulsed Lavage Tip: Tip with splash shield Selective Debridement Selective Debridement - Location: sacrum Selective Debridement - Tools Used: Scalpel;Forceps Selective Debridement - Tissue Removed: eschar and necrotic dermal tissue   Wound Assessment and Plan  Wound Therapy - Assess/Plan/Recommendations Wound Therapy - Clinical Statement: Hope to selectively debride the necrotic tissues away to promote granulation to this pressure ulcer. Wound Therapy - Functional Problem List: immobility Factors Delaying/Impairing Wound Healing: Diabetes Mellitus;Altered sensation;Immobility Hydrotherapy Plan: Debridement;Dressing change;Patient/family education;Pulsatile lavage with suction;Ultrasonic wound therapy @35  KHz (+/- 3 KHz) Wound Therapy - Frequency: 6X / week Wound Therapy - Follow Up Recommendations: Skilled nursing facility Wound Plan: decr. bacterial load and selectively debide to healthy tissues  Wound Therapy Goals- Improve the function of patient's integumentary system by progressing the wound(s) through the phases of wound healing (inflammation - proliferation - remodeling) by: Decrease Necrotic Tissue to: 10 Decrease Necrotic Tissue - Progress: Progressing toward goal Increase Granulation Tissue to: 100 Increase Granulation Tissue - Progress: Progressing toward goal Decrease Length/Width/Depth by (cm): .3 Decrease Length/Width/Depth - Progress: Not met Improve Drainage Characteristics: Serous Improve Drainage Characteristics - Progress: Not met Goals/treatment plan/discharge plan were made with and agreed upon by patient/family: Yes Time For Goal Achievement: 2 weeks Wound Therapy -  Potential for Goals: Good  Goals will be updated until maximal potential achieved or discharge criteria met.  Discharge criteria: when goals achieved, discharge from hospital, MD decision/surgical intervention, no progress towards goals, refusal/missing three consecutive treatments without notification or medical reason.  GP     Veretta Sabourin, [name] 12/29/2012, 3:02 PM 12/29/2012  [place] Bing, PT [phone]  (pager)

## 2012-12-29 NOTE — Progress Notes (Signed)
PHARMACIST - PHYSICIAN COMMUNICATION  CONCERNING: Antibiotic IV to Oral Route Change Policy  RECOMMENDATION: This patient is receiving Metronidazole by intravenous route for Clostridium Difficile Associated Diarrhea.  Based on criteria approved by the Pharmacy and Therapeutics Committee, the antibiotic(s) is/are being converted to the equivalent oral dose form(s). RN confirmed patient is tolerating PO medications.   DESCRIPTION: These criteria include:  Patient being treated for a respiratory tract infection, urinary tract infection, Clostridium Difficile Associated Diarrhea, or cellulitis  The patient is not neutropenic and does not exhibit a GI malabsorption state  The patient is eating (either orally or via tube) and/or has been taking other orally administered medications for a least 24 hours  The patient is improving clinically and has a Tmax < 100.5  If you have questions about this conversion, please contact the Pharmacy Department  []   [phone] )  [facility] [x]   [phone] )  [facility]  []   [phone] )  [facility] []   [phone] )  [facility]      Benjaman Pott, PharmD, BCPS 12/29/2012   1:53 PM

## 2012-12-29 NOTE — Progress Notes (Addendum)
Subjective: No complaints, getting hydroRx  Objective Vital signs in last 24 hours: Filed Vitals:   12/29/12 1030 12/29/12 1048 12/29/12 1139 12/29/12 1306  BP: 106/57  113/55 114/57  Pulse: 87  95 96  Temp:  97.8 F (36.6 C) 97.3 F (36.3 C) 98.1 F (36.7 C)  TempSrc:  Oral Oral Oral  Resp:  18 18 18   Height:      Weight:  81.1 kg (178 lb 12.7 oz)    SpO2:  97% 100% 96%   Weight change: -10.191 kg (-22 lb 7.5 oz)  Intake/Output Summary (Last 24 hours) at 12/29/12 1434 Last data filed at 12/29/12 1048  Gross per 24 hour  Intake    480 ml  Output     -3 ml  Net    483 ml   Labs: Basic Metabolic Panel:  Recent Labs Lab 12/27/12 1836 12/28/12 0450 12/29/12 0658  NA 137 137 135  K 2.9* 3.3* 2.9*  CL 95* 98 97  CO2 26 26 25   GLUCOSE 126* 106* 122*  BUN 17 23 37*  CREATININE 3.05* 3.98* 5.49*  CALCIUM 9.2 9.4 9.5   Liver Function Tests:  Recent Labs Lab 12/27/12 1836 12/28/12 0450  AST 30 27  ALT 34 27  ALKPHOS 75 60  BILITOT 0.5 0.3  PROT 8.1 6.5  ALBUMIN 2.0* 1.7*   No results found for this basename: LIPASE, AMYLASE,  in the last 168 hours No results found for this basename: AMMONIA,  in the last 168 hours CBC:  Recent Labs Lab 12/27/12 1804 12/28/12 0428 12/28/12 1757 12/29/12 0658  WBC 25.4* 20.6* 22.5* 21.5*  NEUTROABS 22.6* 17.8*  --   --   HGB 8.1* 6.7* 8.8* 7.8*  HCT 24.3* 20.1* 25.7* 23.0*  MCV 82.4 83.4 82.4 82.1  PLT 533* 449* 445* 423*   PT/INR: @LABRCNTIP (inr:5)   Scheduled Meds ) . cinacalcet  30 mg Oral BID WC  . collagenase   Topical Daily  . dipyridamole-aspirin  1 capsule Oral BID  . feeding supplement (NEPRO CARB STEADY)  237 mL Oral BID BM  . feeding supplement  30 mL Oral TID WC  . gabapentin  100 mg Oral Daily  . insulin aspart  0-5 Units Subcutaneous QHS  . insulin aspart  0-9 Units Subcutaneous TID WC  . lanthanum  1,000 mg Oral TID WC  . metroNIDAZOLE  500 mg Oral Q8H  . multivitamin  1 tablet Oral Daily  .  omega-3 acid ethyl esters  1 g Oral BID  . ondansetron (ZOFRAN) IV  4 mg Intravenous Once  . vancomycin  500 mg Oral Q6H    Physical Exam:  Blood pressure 114/57, pulse 96, temperature 98.1 F (36.7 C), temperature source Oral, resp. rate 18, height 6\' 4"  (1.93 m), weight 81.1 kg (178 lb 12.7 oz), SpO2 96.00%.  Gen: lethargic but awakens easily  HEENT: EOMI, sclera anicteric, throat clear and dry  Neck: flat neck veins, no JVD, no LAN  Chest: clear bilat, no rales  CV: regular, no rub or gallop, no carotid or femoral bruits, pedal pulses intact  Abdomen: soft, nontender, no ascites or HSM  Back: very deep sacral decub, s/p hydroRx yest and today Ext: 1+ pretibial edema bilat, no joint effusion or deformity  Neuro: alert, oriented to self only, pleasant  Access: L forearm AVF +bruit   [facility] 3hr 45 min EDW 83.5 kg 2K/2CA [place] LFA AVF BFR 500/ DFR A1.5 Epo 15000u, Heparin 5000 u.  Venofer 100 mg IV/HD through 12/29/12  Impression/Plan  1. Stage IV sacral decub- local Rx, per primary 2. 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 ESRD, cont HD MWF 4. HTN/volume- no vol excess, HD today 5. Anemia of CKD- transfused 2u prbc on 5/20, Hb 8.3 today, get records re EPO 6. Secondary HPTH- cont sensipar and Fosrenol. Ca 9.5, phos pend 7. Debility- will likely not be able to do outpt dialysis in the short-term, LTAC will probably be required   [name]  MD [phone] pgr    [phone] cell 12/29/2012, 2:34 PM   Addendum to update Dialysis RX only Claud Kelp PA-C

## 2012-12-30 LAB — CBC
HCT: 25.4 % — ABNORMAL LOW (ref 39.0–52.0)
Hemoglobin: 8.5 g/dL — ABNORMAL LOW (ref 13.0–17.0)
MCH: 28.4 pg (ref 26.0–34.0)
MCHC: 33.5 g/dL (ref 30.0–36.0)
MCV: 84.9 fL (ref 78.0–100.0)
RDW: 15 % (ref 11.5–15.5)

## 2012-12-30 LAB — GLUCOSE, CAPILLARY: Glucose-Capillary: 114 mg/dL — ABNORMAL HIGH (ref 70–99)

## 2012-12-30 LAB — BASIC METABOLIC PANEL
CO2: 28 mEq/L (ref 19–32)
Calcium: 9.5 mg/dL (ref 8.4–10.5)
Chloride: 102 mEq/L (ref 96–112)
Potassium: 4.5 mEq/L (ref 3.5–5.1)
Sodium: 141 mEq/L (ref 135–145)

## 2012-12-30 MED ORDER — SILVER NITRATE-POT NITRATE 75-25 % EX MISC
1.0000 | CUTANEOUS | Status: DC | PRN
  Administered 2012-12-31: 1 via TOPICAL
  Filled 2012-12-30: qty 1

## 2012-12-30 MED ORDER — DARBEPOETIN ALFA-POLYSORBATE 100 MCG/0.5ML IJ SOLN
100.0000 ug | INTRAMUSCULAR | Status: DC
  Filled 2012-12-30: qty 0.5

## 2012-12-30 MED ORDER — MORPHINE SULFATE 2 MG/ML IJ SOLN
2.0000 mg | INTRAMUSCULAR | Status: DC | PRN
  Administered 2012-12-31: 2 mg via INTRAVENOUS
  Filled 2012-12-30: qty 1

## 2012-12-30 NOTE — Progress Notes (Signed)
Physical Therapy Wound Treatment Patient Details  Name: Norman Piacentini MRN: [id number] Date of Birth: [birth year]  Today's Date: 12/30/2012 Time: 4098-1191 Time Calculation (min): 45 min  Subjective  Subjective: What, I've got to do this more...[name] Kitchen? Patient and Family Stated Goals: not stated Date of Onset:  (prior to admission)  Pain Score:    Wound Assessment  Pressure Ulcer 12/27/12 Unstageable - Full thickness tissue loss in which the base of the ulcer is covered by slough (yellow, tan, gray, green or brown) and/or eschar (tan, brown or black) in the wound bed. (Active)  State of Healing Eschar 12/30/2012 12:41 PM  Site / Wound Assessment Black;Yellow;Pale;Pink 12/30/2012 12:41 PM  % Wound base Red or Granulating 20% 12/30/2012 12:41 PM  % Wound base Yellow 50% 12/30/2012 12:41 PM  % Wound base Black 30% 12/30/2012 12:41 PM  Peri-wound Assessment Pink;Erythema (non-blanchable);Black 12/30/2012 12:41 PM  Wound Length (cm) 9.8 cm 12/28/2012 12:22 PM  Wound Width (cm) 10 cm 12/28/2012 12:22 PM  Wound Depth (cm) 2 cm 12/28/2012 12:22 PM  Margins Unattacted edges (unapproximated) 12/30/2012 12:41 PM  Drainage Amount None 12/30/2012 12:41 PM  Drainage Description Odor;Other (Comment) 12/30/2012 12:41 PM  Treatment Debridement (Selective);Hydrotherapy (Pulse lavage) 12/30/2012 12:41 PM  Dressing Type ABD;Gauze (Comment);Moist to dry 12/30/2012 12:41 PM  Dressing Clean;Dry;Intact 12/30/2012 12:41 PM     Wound 12/27/12 Other (Comment) Heel Right (Active)  Site / Wound Assessment Clean;Dry 12/30/2012  9:00 AM  Peri-wound Assessment Intact;Black 12/30/2012  9:00 AM  Margins Attached edges (approximated) 12/28/2012  2:25 PM  Drainage Amount None 12/28/2012  2:25 PM  Dressing Type None 12/30/2012  9:00 AM     Wound 12/27/12 Other (Comment) Toe (Comment  which one) Right (Active)  Site / Wound Assessment Black;Purple 12/30/2012  9:00 AM  Peri-wound Assessment Intact 12/30/2012  9:00 AM  Margins Attached edges  (approximated) 12/29/2012 10:48 AM  Drainage Amount None 12/29/2012  9:21 PM  Treatment Other (Comment) 12/30/2012  9:00 AM  Dressing Type None 12/29/2012  9:21 PM     Incision 04/13/12 Arm Left (Active)   Hydrotherapy Pulsed lavage therapy - wound location: sacrum Pulsed Lavage with Suction (psi): 4 psi (to 12) Pulsed Lavage with Suction - Normal Saline Used: 1000 mL Pulsed Lavage Tip: Tip with splash shield Selective Debridement Selective Debridement - Location: sacrum Selective Debridement - Tools Used: Scalpel;Forceps;Scissors Selective Debridement - Tissue Removed: eschar and necrotic dermal tissue   Wound Assessment and Plan  Wound Therapy - Assess/Plan/Recommendations Wound Therapy - Clinical Statement: Hope to selectively debride the necrotic tissues away to promote granulation to this pressure ulcer. Wound Therapy - Functional Problem List: immobility Factors Delaying/Impairing Wound Healing: Diabetes Mellitus;Altered sensation;Immobility Hydrotherapy Plan: Debridement;Dressing change;Patient/family education;Pulsatile lavage with suction;Ultrasonic wound therapy @35  KHz (+/- 3 KHz) Wound Therapy - Frequency: 6X / week Wound Therapy - Follow Up Recommendations: Skilled nursing facility Wound Plan: decr. bacterial load and selectively debide to healthy tissues  Wound Therapy Goals- Improve the function of patient's integumentary system by progressing the wound(s) through the phases of wound healing (inflammation - proliferation - remodeling) by: Decrease Necrotic Tissue to: 10 Decrease Necrotic Tissue - Progress: Progressing toward goal Increase Granulation Tissue to: 100 Increase Granulation Tissue - Progress: Progressing toward goal Decrease Length/Width/Depth by (cm): .3 Improve Drainage Characteristics: Serous Improve Drainage Characteristics - Progress: Progressing toward goal Goals/treatment plan/discharge plan were made with and agreed upon by patient/family: Yes Time  For Goal Achievement: 2 weeks Wound Therapy - Potential for Goals: Good  Goals  will be updated until maximal potential achieved or discharge criteria met.  Discharge criteria: when goals achieved, discharge from hospital, MD decision/surgical intervention, no progress towards goals, refusal/missing three consecutive treatments without notification or medical reason.  GP     Chakira Jachim, [name] 12/30/2012, 12:45 PM 12/30/2012  [place] Bing, PT [phone]  (pager)

## 2012-12-30 NOTE — Evaluation (Signed)
Physical Therapy Evaluation Patient Details Name: [name] MRN: [id number] DOB: Jun 18, 1931 Today's Date: 12/30/2012 Time: 4098-1191 PT Time Calculation (min): 15 min  PT Assessment / Plan / Recommendation Clinical Impression  pt admitted from [facility] due to low H/H.  Initially Hydrotherapy consulted due to sacral decubitus and it was soon clear that pt has not been able to move much in a while, thus decubitus and very stiff and painful limbs.  Today, pt was have frequent episodes of diarrhea due to C-diff so the evaluation was limited, but will trial pt to see if he can participate once his diarrhea is managed.    PT Assessment  Patient needs continued PT services    Follow Up Recommendations  SNF    Does the patient have the potential to tolerate intense rehabilitation      Barriers to Discharge        Equipment Recommendations  Other (comment) (TBA post acute)    Recommendations for Other Services     Frequency Min 2X/week    Precautions / Restrictions Precautions Precautions: Fall Restrictions Weight Bearing Restrictions: No   Pertinent Vitals/Pain       Mobility  Bed Mobility Bed Mobility: Rolling Right;Rolling Left Rolling Right: 1: +2 Total assist Rolling Right: Patient Percentage: 10% Rolling Left: 1: +2 Total assist Rolling Left: Patient Percentage: 30% Details for Bed Mobility Assistance: pt highly resistant to movement due to pain especially to the right.  He  is biased to the left posturally Transfers Transfers: Not assessed (due to pt began to stool so did not make it EOB) Ambulation/Gait Ambulation/Gait Assistance: Not tested (comment) Stairs: No Wheelchair Mobility Wheelchair Mobility: No    Exercises     PT Diagnosis: Generalized weakness;Acute pain  PT Problem List: Decreased strength;Decreased range of motion;Decreased activity tolerance;Decreased mobility;Decreased knowledge of use of DME;Decreased knowledge of precautions;Impaired  tone;Decreased skin integrity;Pain PT Treatment Interventions: Functional mobility training;Therapeutic activities;Balance training;Patient/family education   PT Goals Acute Rehab PT Goals PT Goal Formulation: With patient Time For Goal Achievement: 01/13/13 Potential to Achieve Goals: Good Pt will Roll Supine to Right Side: with mod assist PT Goal: Rolling Supine to Right Side - Progress: Goal set today Pt will Roll Supine to Left Side: with min assist PT Goal: Rolling Supine to Left Side - Progress: Goal set today Pt will go Supine/Side to Sit: with mod assist PT Goal: Supine/Side to Sit - Progress: Goal set today Pt will go Sit to Stand: with max assist PT Goal: Sit to Stand - Progress: Goal set today Pt will Transfer Bed to Chair/Chair to Bed: with max assist PT Transfer Goal: Bed to Chair/Chair to Bed - Progress: Goal set today  Visit Information  Last PT Received On: 12/30/12 Assistance Needed: +2    Subjective Data  Subjective: Oh....what are you doing? Patient Stated Goal: pt did not participate   Prior Functioning  Home Living Lives With: Spouse;Son Available Help at Discharge: Family;Available 24 hours/day ([facility]) Type of Home: Skilled Nursing Facility (since march/April 2014) Home Layout: One level Bathroom Shower/Tub: Engineer, manufacturing systems: Standard Home Adaptive Equipment: Bedside commode/3-in-1;Shower chair with back;Straight cane;Walker - rolling Additional Comments: pt was living with son.  He was; admitted in March for syncope and had difficulty mobilizing/amb due to painful L knee Prior Function Level of Independence: Needs assistance Needs Assistance: Bathing;Dressing;Grooming;Toileting Communication Communication: No difficulties    Cognition  Cognition Arousal/Alertness: Awake/alert Behavior During Therapy: Restless Overall Cognitive Status: Difficult to assess    Extremity/Trunk Assessment  Right Upper Extremity Assessment RUE  ROM/Strength/Tone: [facility] for tasks assessed;Deficits RUE ROM/Strength/Tone Deficits: strength not as much a problem as tone or ROM challenges.  But not overt contracturing Left Upper Extremity Assessment LUE ROM/Strength/Tone: WFL for tasks assessed;Deficits LUE ROM/Strength/Tone Deficits: See R UE Right Lower Extremity Assessment RLE ROM/Strength/Tone: Deficits RLE ROM/Strength/Tone Deficits: AA/PROM limited by pain and tonal increases, resistance to movement whether pain/arthritis or spasticity Left Lower Extremity Assessment LLE ROM/Strength/Tone: Deficits LLE ROM/Strength/Tone Deficits: See R LE   Balance Balance Balance Assessed: No  End of Session PT - End of Session Activity Tolerance: Patient limited by pain Patient left: in bed;with call bell/phone within reach;Other (comment)  GP     Reah Justo, [name] 12/30/2012, 3:49 PM 12/30/2012  [place] Bing, PT [phone]  (pager)

## 2012-12-30 NOTE — Progress Notes (Signed)
[facility]    Jhett Fretwell ZOX:[id number] DOB: 04-28-1931 DOA: 12/27/2012 PCP: Crawford Givens, MD  Brief narrative: 79 year old male patient with chronic kidney disease on dialysis also prior history of CVA and diabetes. Sent from [facility] skilled nursing facility because of hemoglobin lower than baseline. Patient's sister came with the patient he was able to give limited history. No apparent other symptomatology prior to arrival such as diarrhea cough or abdominal pain or shortness of breath. He is known to be bed bound and has multiple decubiti. She reports these are well cared for at the nursing facility. In the emergency department he was noted to be borderline hypotensive with a systolic blood pressure 104 respiratory 24 and tachycardic with heart rate of 120 he was afebrile. His potassium was low at 2.9 and leukocytosis of 25,000 fecal occult blood was negative baseline hemoglobin 10 and presentation hemoglobin 8.  Assessment/Plan: Principal Problem:   Sepsis  -primarily 2/2 colitis but need to FU on all cx's -BP better-did receive IVF's briefly -PCT 3.84 with normal lactic acid - resolved      Clostridium difficile colitis - severe type  Probably the source for sepsis.  -on oral Vancomycin and IV Flagyl since 5/20 - patient with copious liquid diarrhea - placed flexiseal 12/30/12     Diabetes mellitus with end stage renal disease -cbg well controlled -SSI    HYPERTENSION -BP controlled and NOT on meds pre admit    CKD (chronic kidney disease) stage V requiring chronic dialysis -per nephrology. Had HD 12/29/12      Anemia of chronic disease -PRBC's for hgb 8- baseline 10    History of CVA (cerebrovascular accident) -with vascular dementia -return to SNF is impeded by immobility     Decubitus ulcer of buttock, stage 3/Decubitus ulcer of heel, stage 2   -undergoing hydrotherapy.  -air mattress  Hypokalemia from severe diarrhea - repleting and recheck   DVT  prophylaxis: SCDs Code Status: Full code Family Communication:patient  Disposition Plan: SNF  Isolation: Contact isolation for C. difficile-positive status  Consultants: Nephrology  Procedures: None  Antibiotics: Zosyn 5/19 >>> 5/20 Vancomycin 5/19 >>> 5/20 Oral vancomycin 5/20 >>> IV Flagyl 5/20 >>>  HPI/Subjective: Patient awake.   Objective: Blood pressure 122/68, pulse 90, temperature 97.8 F (36.6 C), temperature source Oral, resp. rate 20, height 6\' 4"  (1.93 m), weight 81.1 kg (178 lb 12.7 oz), SpO2 93.00%.  Intake/Output Summary (Last 24 hours) at 12/30/12 1022 Last data filed at 12/30/12 0800  Gross per 24 hour  Intake    240 ml  Output     -3 ml  Net    243 ml    Patient Vitals for the past 24 hrs:  BP Temp Temp src Pulse Resp SpO2 Weight  12/30/12 0800 122/68 mmHg 97.8 F (36.6 C) Oral 90 20 93 % -  12/30/12 0419 139/71 mmHg 97.5 F (36.4 C) Oral 92 20 93 % -  12/29/12 2111 103/37 mmHg 97.7 F (36.5 C) Oral 104 18 100 % -  12/29/12 1306 114/57 mmHg 98.1 F (36.7 C) Oral 96 18 96 % -  12/29/12 1139 113/55 mmHg 97.3 F (36.3 C) Oral 95 18 100 % -  12/29/12 1048 - 97.8 F (36.6 C) Oral - 18 97 % 81.1 kg (178 lb 12.7 oz)  12/29/12 1030 106/57 mmHg - - 87 - - -     Exam: General: No acute respiratory distress Lungs: Clear to auscultation bilaterally without wheezes or crackles, RA Cardiovascular: Regular rate  and rhythm without murmur gallop or rub normal S1 and S2, no peripheral edema or JVD Abdomen: Nontender, nondistended, soft, bowel sounds positive, no rebound, no ascites, no appreciable mass, diarrhea Has deep unstageable wound on buttock Contractures   Data Reviewed: Basic Metabolic Panel:  Recent Labs Lab 12/27/12 1836 12/28/12 0450 12/29/12 0658  NA 137 137 135  K 2.9* 3.3* 2.9*  CL 95* 98 97  CO2 26 26 25   GLUCOSE 126* 106* 122*  BUN 17 23 37*  CREATININE 3.05* 3.98* 5.49*  CALCIUM 9.2 9.4 9.5  PHOS  --   --  3.5   Liver  Function Tests:  Recent Labs Lab 12/27/12 1836 12/28/12 0450  AST 30 27  ALT 34 27  ALKPHOS 75 60  BILITOT 0.5 0.3  PROT 8.1 6.5  ALBUMIN 2.0* 1.7*   No results found for this basename: LIPASE, AMYLASE,  in the last 168 hours No results found for this basename: AMMONIA,  in the last 168 hours CBC:  Recent Labs Lab 12/27/12 1804 12/28/12 0428 12/28/12 1757 12/29/12 0658 12/30/12 0525  WBC 25.4* 20.6* 22.5* 21.5* 17.4*  NEUTROABS 22.6* 17.8*  --   --   --   HGB 8.1* 6.7* 8.8* 7.8* 8.5*  HCT 24.3* 20.1* 25.7* 23.0* 25.4*  MCV 82.4 83.4 82.4 82.1 84.9  PLT 533* 449* 445* 423* 462*   Cardiac Enzymes:  Recent Labs Lab 12/27/12 2305  TROPONINI <0.30   BNP (last 3 results) No results found for this basename: PROBNP,  in the last 8760 hours CBG:  Recent Labs Lab 12/28/12 2058 12/29/12 1141 12/29/12 1748 12/29/12 2110 12/30/12 0805  GLUCAP 118* 80 120* 124* 125*    Recent Results (from the past 240 hour(s))  CULTURE, BLOOD (ROUTINE X 2)     Status: None   Collection Time    12/27/12  8:55 PM      Result Value Range Status   Specimen Description BLOOD HAND RIGHT   Final   Special Requests BOTTLES DRAWN AEROBIC AND ANAEROBIC 6CC   Final   Culture  Setup Time 12/28/2012 01:49   Final   Culture     Final   Value:        BLOOD CULTURE RECEIVED NO GROWTH TO DATE CULTURE WILL BE HELD FOR 5 DAYS BEFORE ISSUING A FINAL NEGATIVE REPORT   Report Status PENDING   Incomplete  CULTURE, BLOOD (ROUTINE X 2)     Status: None   Collection Time    12/27/12  9:00 PM      Result Value Range Status   Specimen Description BLOOD ARM RIGHT   Final   Special Requests BOTTLES DRAWN AEROBIC ONLY 5CC   Final   Culture  Setup Time 12/28/2012 01:51   Final   Culture     Final   Value:        BLOOD CULTURE RECEIVED NO GROWTH TO DATE CULTURE WILL BE HELD FOR 5 DAYS BEFORE ISSUING A FINAL NEGATIVE REPORT   Report Status PENDING   Incomplete  MRSA PCR SCREENING     Status: None    Collection Time    12/27/12 10:50 PM      Result Value Range Status   MRSA by PCR NEGATIVE  NEGATIVE Final   Comment:            The GeneXpert MRSA Assay (FDA     approved for NASAL specimens     only), is one component of a  comprehensive MRSA colonization     surveillance program. It is not     intended to diagnose MRSA     infection nor to guide or     monitor treatment for     MRSA infections.  URINE CULTURE     Status: None   Collection Time    12/28/12 12:05 AM      Result Value Range Status   Specimen Description URINE, CATHETERIZED   Final   Special Requests NONE   Final   Culture  Setup Time 12/28/2012 04:58   Final   Colony Count NO GROWTH   Final   Culture NO GROWTH   Final   Report Status 12/29/2012 FINAL   Final  CLOSTRIDIUM DIFFICILE BY PCR     Status: Abnormal   Collection Time    12/28/12  1:26 AM      Result Value Range Status   C difficile by pcr POSITIVE (*) NEGATIVE Final   Comment: CRITICAL RESULT CALLED TO, READ BACK BY AND VERIFIED WITH:     J RESUELLO,RN 12/28/12 0850 BY K SCHULTZ     Studies:  Recent x-ray studies have been reviewed in detail by the Attending Physician  Scheduled Meds:  . cinacalcet  30 mg Oral BID WC  . collagenase   Topical Daily  . dipyridamole-aspirin  1 capsule Oral BID  . feeding supplement (NEPRO CARB STEADY)  237 mL Oral BID BM  . feeding supplement  30 mL Oral TID WC  . gabapentin  100 mg Oral Daily  . insulin aspart  0-5 Units Subcutaneous QHS  . insulin aspart  0-9 Units Subcutaneous TID WC  . lanthanum  1,000 mg Oral TID WC  . metroNIDAZOLE  500 mg Oral Q8H  . multivitamin  1 tablet Oral Daily  . omega-3 acid ethyl esters  1 g Oral BID  . ondansetron (ZOFRAN) IV  4 mg Intravenous Once  . vancomycin  500 mg Oral Q6H      Karthikeya Funke,MD [phone] 12/30/2012, 10:22 AM

## 2012-12-30 NOTE — Progress Notes (Signed)
Subjective: No complaints, getting hydroRx  Objective Vital signs in last 24 hours: Filed Vitals:   12/29/12 1306 12/29/12 2111 12/30/12 0419 12/30/12 0800  BP: 114/57 103/37 139/71 122/68  Pulse: 96 104 92 90  Temp: 98.1 F (36.7 C) 97.7 F (36.5 C) 97.5 F (36.4 C) 97.8 F (36.6 C)  TempSrc: Oral Oral Oral Oral  Resp: 18 18 20 20   Height:      Weight:      SpO2: 96% 100% 93% 93%   Weight change: -0.2 kg (-7.1 oz)  Intake/Output Summary (Last 24 hours) at 12/30/12 1223 Last data filed at 12/30/12 0800  Gross per 24 hour  Intake    240 ml  Output      0 ml  Net    240 ml   Labs: Basic Metabolic Panel:  Recent Labs Lab 12/27/12 1836 12/28/12 0450 12/29/12 0658  NA 137 137 135  K 2.9* 3.3* 2.9*  CL 95* 98 97  CO2 26 26 25   GLUCOSE 126* 106* 122*  BUN 17 23 37*  CREATININE 3.05* 3.98* 5.49*  CALCIUM 9.2 9.4 9.5  PHOS  --   --  3.5   Liver Function Tests:  Recent Labs Lab 12/27/12 1836 12/28/12 0450  AST 30 27  ALT 34 27  ALKPHOS 75 60  BILITOT 0.5 0.3  PROT 8.1 6.5  ALBUMIN 2.0* 1.7*   No results found for this basename: LIPASE, AMYLASE,  in the last 168 hours No results found for this basename: AMMONIA,  in the last 168 hours CBC:  Recent Labs Lab 12/27/12 1804 12/28/12 0428 12/28/12 1757 12/29/12 0658 12/30/12 0525  WBC 25.4* 20.6* 22.5* 21.5* 17.4*  NEUTROABS 22.6* 17.8*  --   --   --   HGB 8.1* 6.7* 8.8* 7.8* 8.5*  HCT 24.3* 20.1* 25.7* 23.0* 25.4*  MCV 82.4 83.4 82.4 82.1 84.9  PLT 533* 449* 445* 423* 462*   PT/INR: @LABRCNTIP (inr:5)   Scheduled Meds ) . cinacalcet  30 mg Oral BID WC  . collagenase   Topical Daily  . dipyridamole-aspirin  1 capsule Oral BID  . feeding supplement (NEPRO CARB STEADY)  237 mL Oral BID BM  . feeding supplement  30 mL Oral TID WC  . gabapentin  100 mg Oral Daily  . insulin aspart  0-5 Units Subcutaneous QHS  . insulin aspart  0-9 Units Subcutaneous TID WC  . lanthanum  1,000 mg Oral TID WC  .  metroNIDAZOLE  500 mg Oral Q8H  . multivitamin  1 tablet Oral Daily  . omega-3 acid ethyl esters  1 g Oral BID  . ondansetron (ZOFRAN) IV  4 mg Intravenous Once  . vancomycin  500 mg Oral Q6H    Physical Exam:  Blood pressure 122/68, pulse 90, temperature 97.8 F (36.6 C), temperature source Oral, resp. rate 20, height 6\' 4"  (1.93 m), weight 81.1 kg (178 lb 12.7 oz), SpO2 93.00%.  Gen: alert, partially oriented ("Obama", "Thursday", "2008", "I'm at [facility] dialysis unit") HEENT: EOMI, sclera anicteric, throat clear and dry  Neck: flat neck veins, no JVD, no LAN  Chest: clear bilat, no rales  CV: regular, no rub or gallop, no carotid or femoral bruits, pedal pulses intact  Abdomen: soft, nontender, no ascites or HSM  Back: very deep sacral decub, s/p hydroRx yest and today Ext: 1+ pretibial edema bilat, no joint effusion or deformity  Neuro: alert, oriented to self only, pleasant  Access: L forearm AVF +bruit   Outpatient  [facility] 3hr 45 min EDW 83.5 kg 2K/2CA Bath LFA AVF BFR 500/ DFR A1.5 Epo 15000u, Heparin 5000 u. Venofer 100 mg IV/HD through 12/29/12  Impression/Plan  1. Stage IV sacral decub- local Rx, per primary 2. 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 Hypokalemia- due to diarrhea, replacing per primary. High K bath with HD 4. ESRD, cont HD MWF 5. HTN/volume- no vol excess, HD tomorrow. Below dry wt, BP good 6. Anemia of CKD- transfused 2u prbc on 5/20, Hb 8.5, will give darbe 100 ug on 5/23 w HD in place of EPO 7. Secondary HPTH- cont sensipar and Fosrenol. Ca 9.5, phos pend 8. Debility- will likely not be able to do outpt dialysis in the short-term, LTAC will probably be required 9. EOL- full code and aggressive medical Rx for now. Discussed with Dr Elray Buba  MD [phone] pgr    [phone] cell 12/30/2012, 12:23 PM

## 2012-12-31 DIAGNOSIS — K921 Melena: Secondary | ICD-10-CM

## 2012-12-31 LAB — GLUCOSE, CAPILLARY: Glucose-Capillary: 140 mg/dL — ABNORMAL HIGH (ref 70–99)

## 2012-12-31 MED ORDER — MORPHINE SULFATE 4 MG/ML IJ SOLN
4.0000 mg | INTRAMUSCULAR | Status: DC | PRN
  Filled 2012-12-31: qty 1

## 2012-12-31 MED ORDER — DARBEPOETIN ALFA-POLYSORBATE 100 MCG/0.5ML IJ SOLN
100.0000 ug | INTRAMUSCULAR | Status: DC
  Filled 2012-12-31: qty 0.5

## 2012-12-31 MED ORDER — ACETAMINOPHEN 500 MG PO TABS
500.0000 mg | ORAL_TABLET | Freq: Four times a day (QID) | ORAL | Status: DC
  Administered 2012-12-31 – 2013-01-10 (×35): 500 mg via ORAL
  Filled 2012-12-31 (×43): qty 1

## 2012-12-31 MED ORDER — CARBIDOPA-LEVODOPA CR 25-100 MG PO TBCR
1.0000 | EXTENDED_RELEASE_TABLET | Freq: Two times a day (BID) | ORAL | Status: DC
  Administered 2012-12-31 – 2013-01-09 (×18): 1 via ORAL
  Filled 2012-12-31 (×22): qty 1

## 2012-12-31 MED ORDER — DARBEPOETIN ALFA-POLYSORBATE 100 MCG/0.5ML IJ SOLN: 100.0000 ug | INTRAMUSCULAR | Status: DC

## 2012-12-31 NOTE — Progress Notes (Signed)
NUTRITION FOLLOW UP  DOCUMENTATION CODES  Per approved criteria   -Severe malnutrition in the context of chronic illness    Intervention:   1. Continue Nepro Shakes and Prostat to promote wound healing; oral intake of meals improved 2. RD to continue to follow nutrition care plan  Nutrition Dx:   Increased nutrient needs related to advanced stage wounds as evidenced by estimated needs. Ongoing.  Goal:   Intake to meet >90% of estimated nutrition needs. Improved.  Monitor:   weight trends, lab trends, I/O's, PO intake, supplement tolerance  Assessment:   PMHx significant for ESRD on HD MWF, OA, CVA, HTN, dementia. From [facility]; pt bedbound at baseline. MD at HD sent pt to ED 2/2 low Hgb.  Receiving hydrotherapy for sacral decubitus. Discussed during progression rounds.  Renal notes that pt will likely not be able to do outpt HD in the short-term, LTAC probably required.  Continues on Carbohydrate Modified Medium diet - consuming 50 - 100% of meals. Continues with orders for Nepro Shake BID and 30 ml Prostat TID.  Pt meets criteria for severe MALNUTRITION in the context of chronic illness as evidenced by 17% wt loss x 4 months and intake of <75% x at least 1 month.   Height: Ht Readings from Last 1 Encounters:  12/28/12 6\' 4"  (1.93 m)    Weight Status:   Wt Readings from Last 1 Encounters:  12/30/12 181 lb 7 oz (82.3 kg)    Re-estimated needs:  Kcal: 2100 - 2400 Protein: 120 - 135 g Fluid: 1.2 liters  Skin:  DTI to R heel  Unstageable to sacrum  Diet Order: Carb Control (1600 - 2000)   Intake/Output Summary (Last 24 hours) at 12/31/12 0956 Last data filed at 12/30/12 1500  Gross per 24 hour  Intake    240 ml  Output      0 ml  Net    240 ml    Last BM: 5/22   Labs:   Recent Labs Lab 12/28/12 0450 12/29/12 0658 12/30/12 1156  NA 137 135 141  K 3.3* 2.9* 4.5  CL 98 97 102  CO2 26 25 28   BUN 23 37* 30*  CREATININE 3.98* 5.49* 3.93*   CALCIUM 9.4 9.5 9.5  PHOS  --  3.5  --   GLUCOSE 106* 122* 129*    CBG (last 3)   Recent Labs  12/30/12 1659 12/30/12 2127 12/31/12 0802  GLUCAP 101* 114* 98    Scheduled Meds: . cinacalcet  30 mg Oral BID WC  . collagenase   Topical Daily  . darbepoetin  100 mcg Intravenous Q7 days  . dipyridamole-aspirin  1 capsule Oral BID  . feeding supplement (NEPRO CARB STEADY)  237 mL Oral BID BM  . feeding supplement  30 mL Oral TID WC  . gabapentin  100 mg Oral Daily  . insulin aspart  0-5 Units Subcutaneous QHS  . insulin aspart  0-9 Units Subcutaneous TID WC  . lanthanum  1,000 mg Oral TID WC  . metroNIDAZOLE  500 mg Oral Q8H  . multivitamin  1 tablet Oral Daily  . omega-3 acid ethyl esters  1 g Oral BID  . ondansetron (ZOFRAN) IV  4 mg Intravenous Once  . vancomycin  500 mg Oral Q6H    Continuous Infusions:   Jarold Motto MS, RD, LDN Pager: [phone] After-hours pager: [phone]

## 2012-12-31 NOTE — Progress Notes (Signed)
Physical Therapy Wound Treatment Patient Details  Name: [name] MRN: [id number] Date of Birth: Apr 20, 1931  Today's Date: 12/31/2012 Time: 1006-1101 Time Calculation (min): 55 min  Subjective  Subjective: They haven't done any of this for me... Teacher, adult education) Patient and Family Stated Goals: not stated Date of Onset:  (prior to admission)  Pain Score: Pain Score:   7  Wound Assessment  Pressure Ulcer 12/27/12 Unstageable - Full thickness tissue loss in which the base of the ulcer is covered by slough (yellow, tan, gray, green or brown) and/or eschar (tan, brown or black) in the wound bed. (Active)  State of Healing Eschar 12/31/2012 12:34 PM  Site / Wound Assessment Black;Yellow;Pale;Pink 12/31/2012 12:34 PM  % Wound base Red or Granulating 20% 12/31/2012 12:34 PM  % Wound base Yellow 50% 12/31/2012 12:34 PM  % Wound base Black 30% 12/31/2012 12:34 PM  Peri-wound Assessment Pink;Erythema (non-blanchable);Black 12/31/2012 12:34 PM  Wound Length (cm) 9.8 cm 12/28/2012 12:22 PM  Wound Width (cm) 10 cm 12/28/2012 12:22 PM  Wound Depth (cm) 2 cm 12/28/2012 12:22 PM  Margins Unattacted edges (unapproximated) 12/31/2012 12:34 PM  Drainage Amount None 12/31/2012 12:34 PM  Drainage Description Serosanguineous 12/31/2012 12:34 PM  Treatment Debridement (Selective);Hydrotherapy (Pulse lavage) 12/31/2012 12:34 PM  Dressing Type ABD;Gauze (Comment);Moist to dry 12/31/2012 12:34 PM  Dressing Clean;Dry;Intact 12/31/2012 12:34 PM     Wound 12/27/12 Other (Comment) Heel Right (Active)  Site / Wound Assessment Clean;Dry 12/31/2012  8:39 AM  % Wound base Red or Granulating 85% 12/31/2012  8:39 AM  % Wound base Yellow 0% 12/31/2012  8:39 AM  % Wound base Black 0% 12/31/2012  8:39 AM  % Wound base Other (Comment) 15% 12/31/2012  8:39 AM  Peri-wound Assessment Intact;Black 12/31/2012  8:39 AM  Tunneling (cm) 0.1 12/31/2012  8:39 AM  Undermining (cm) 0.1 12/31/2012  8:39 AM  Margins Attached edges (approximated)  12/31/2012  8:39 AM  Closure None 12/31/2012  8:39 AM  Drainage Amount None 12/31/2012  8:39 AM  Treatment Cleansed;Other (Comment) 12/31/2012  8:39 AM  Dressing Type None 12/31/2012  8:39 AM     Wound 12/27/12 Other (Comment) Toe (Comment  which one) Right (Active)  Site / Wound Assessment Black;Purple 12/31/2012  8:39 AM  % Wound base Red or Granulating 0% 12/31/2012  8:39 AM  % Wound base Yellow 0% 12/31/2012  8:39 AM  % Wound base Black 50% 12/31/2012  8:39 AM  % Wound base Other (Comment) 50% 12/31/2012  8:39 AM  Peri-wound Assessment Intact 12/31/2012  8:39 AM  Tunneling (cm) 0.1 12/31/2012  8:39 AM  Undermining (cm) 0.1 12/31/2012  8:39 AM  Margins Attached edges (approximated) 12/31/2012  8:39 AM  Closure None 12/31/2012  8:39 AM  Drainage Amount None 12/31/2012  8:39 AM  Treatment Cleansed 12/31/2012  8:39 AM  Dressing Type None 12/31/2012  8:39 AM     Incision 04/13/12 Arm Left (Active)   Hydrotherapy Pulsed lavage therapy - wound location: sacrum Pulsed Lavage with Suction (psi): 4 psi (to 12) Pulsed Lavage with Suction - Normal Saline Used: 1000 mL Pulsed Lavage Tip: Tip with splash shield Selective Debridement Selective Debridement - Location: sacrum Selective Debridement - Tools Used: Scalpel;Forceps;Scissors Selective Debridement - Tissue Removed: eschar and necrotic dermal tissue   Wound Assessment and Plan  Wound Therapy - Assess/Plan/Recommendations Wound Therapy - Clinical Statement: Hope to selectively debride the necrotic tissues away to promote granulation to this pressure ulcer. Wound Therapy - Functional Problem List: immobility Factors Delaying/Impairing Wound  Healing: Diabetes Mellitus;Altered sensation;Immobility Hydrotherapy Plan: Debridement;Dressing change;Patient/family education;Pulsatile lavage with suction;Ultrasonic wound therapy @35  KHz (+/- 3 KHz) Wound Therapy - Frequency: 6X / week Wound Therapy - Follow Up Recommendations: Skilled nursing facility Wound  Plan: decr. bacterial load and selectively debide to healthy tissues  Wound Therapy Goals- Improve the function of patient's integumentary system by progressing the wound(s) through the phases of wound healing (inflammation - proliferation - remodeling) by: Decrease Necrotic Tissue to: 10 Decrease Necrotic Tissue - Progress: Progressing toward goal Increase Granulation Tissue to: 100 Increase Granulation Tissue - Progress: Progressing toward goal Decrease Length/Width/Depth by (cm): .3 Decrease Length/Width/Depth - Progress: Not met Improve Drainage Characteristics: Serous Improve Drainage Characteristics - Progress: Not met Goals/treatment plan/discharge plan were made with and agreed upon by patient/family: Yes Time For Goal Achievement: 2 weeks Wound Therapy - Potential for Goals: Good  Goals will be updated until maximal potential achieved or discharge criteria met.  Discharge criteria: when goals achieved, discharge from hospital, MD decision/surgical intervention, no progress towards goals, refusal/missing three consecutive treatments without notification or medical reason.  GP     Rhoda Waldvogel, [name] 12/31/2012, 12:37 PM 12/31/2012  Cheshire Bing, PT [phone]  (pager)

## 2012-12-31 NOTE — Progress Notes (Signed)
[facility] Progress Note  Subjective:   No complaints. Did not tolerate hydrotherapy this morning per notes, severe pain.    Objective Filed Vitals:   12/30/12 1744 12/30/12 2128 12/31/12 0418 12/31/12 0848  BP: 132/74 130/66 158/82 144/58  Pulse: 90 96 92 88  Temp: 97.5 F (36.4 C) 98.7 F (37.1 C) 97.8 F (36.6 C) 98.4 F (36.9 C)  TempSrc: Oral Oral Oral Oral  Resp: 20 20 18 18   Height:      Weight:  82.3 kg (181 lb 7 oz)    SpO2: 95% 95% 95% 96%   Physical Exam General: Alert, partially oriented ("Doesn't know", "1952", "[facility]" Knows it's his HD day) Heart: RRR Lungs: CTA bilaterally, no wheezes, rales, rhonci Abdomen: soft, NT, non-distended, normal BS Back: Sacral Decub/not examined Extremities: Trace pretibial edema. SCD to LLE Dialysis Access: LFA AVF + bruit  [facility]patient [facility]: [facility] 3hr 45 min EDW 83.5 kg 2K/2CA Bath LFA AVF BFR 500/ DFR A1.5 Epo 15000u, Heparin 5000 u. Venofer 100 mg IV/HD through 12/29/12   Assessment/Plan: 1. Stage IV sacral decub- local Rx, per primary. Did not tolerate hydrotherapy, likely requiring more analgesia per PT 2. C Diff- on po vanc  3. Hypokalemia- Secondary to diarrhea. Repleted per primary. K+4.5. Resume standard 2K+ bath 4. ESRD, cont HD MWF  5. HTN/volume- no vol excess, below dry wt, SBP's 130's-150s.Try for UF 2L today. 6. Anemia of CKD- Hgb 8.5 s/p  2u prbc on 5/20,  will give darbe 100 ug on 5/23 w HD in place of EPO  7. Secondary HPTH- Ca 9.5. P 3.5. Continue sensipar and Fosrenol. 8. Nutrition - Albumin 1.7. Renal diet. On Nepro and Pro-stat to promote wound healing. Multivitamin 9. Debility- will likely not be able to do outpt dialysis in the short-term, LTAC will probably be required  10. EOL- full code and aggressive medical Rx for now. Discussed with primary MD    Scot Jun. Thad Ranger [facility] Pager [phone] 12/31/2012,2:28 PM  LOS: 4 days   Patient seen and examined.   Agree with assessment and plan as above. [name]  MD [phone] pgr    [phone] cell 12/31/2012, 4:32 PM   Additional Objective Labs: Basic Metabolic Panel:  Recent Labs Lab 12/28/12 0450 12/29/12 0658 12/30/12 1156  NA 137 135 141  K 3.3* 2.9* 4.5  CL 98 97 102  CO2 26 25 28   GLUCOSE 106* 122* 129*  BUN 23 37* 30*  CREATININE 3.98* 5.49* 3.93*  CALCIUM 9.4 9.5 9.5  PHOS  --  3.5  --    Liver Function Tests:  Recent Labs Lab 12/27/12 1836 12/28/12 0450  AST 30 27  ALT 34 27  ALKPHOS 75 60  BILITOT 0.5 0.3  PROT 8.1 6.5  ALBUMIN 2.0* 1.7*   No results found for this basename: LIPASE, AMYLASE,  in the last 168 hours CBC:  Recent Labs Lab 12/27/12 1804 12/28/12 0428 12/28/12 1757 12/29/12 0658 12/30/12 0525  WBC 25.4* 20.6* 22.5* 21.5* 17.4*  NEUTROABS 22.6* 17.8*  --   --   --   HGB 8.1* 6.7* 8.8* 7.8* 8.5*  HCT 24.3* 20.1* 25.7* 23.0* 25.4*  MCV 82.4 83.4 82.4 82.1 84.9  PLT 533* 449* 445* 423* 462*   Blood Culture    Component Value Date/Time   SDES URINE, CATHETERIZED 12/28/2012 0005   SPECREQUEST NONE 12/28/2012 0005   CULT NO GROWTH 12/28/2012 0005   REPTSTATUS 12/29/2012 FINAL 12/28/2012 0005  Cardiac Enzymes:  Recent Labs Lab 12/27/12 2305  TROPONINI <0.30   CBG:  Recent Labs Lab 12/30/12 1137 12/30/12 1659 12/30/12 2127 12/31/12 0802 12/31/12 1228  GLUCAP 129* 101* 114* 98 119*   Studies/Results: No results found. Medications:   . acetaminophen  500 mg Oral QID  . carbidopa-levodopa  1 tablet Oral BID  . cinacalcet  30 mg Oral BID WC  . collagenase   Topical Daily  . darbepoetin  100 mcg Intravenous Q Fri-HD  . dipyridamole-aspirin  1 capsule Oral BID  . feeding supplement (NEPRO CARB STEADY)  237 mL Oral BID BM  . feeding supplement  30 mL Oral TID WC  . gabapentin  100 mg Oral Daily  . insulin aspart  0-5 Units Subcutaneous QHS  . insulin aspart  0-9 Units Subcutaneous TID WC  . lanthanum  1,000 mg Oral TID  WC  . multivitamin  1 tablet Oral Daily  . omega-3 acid ethyl esters  1 g Oral BID  . ondansetron (ZOFRAN) IV  4 mg Intravenous Once  . vancomycin  500 mg Oral Q6H

## 2012-12-31 NOTE — Progress Notes (Signed)
[facility]    [name] WUJ:[id number] DOB: 1931/03/24 DOA: 12/27/2012 PCP: Crawford Givens, MD  Brief narrative: 79 year old male patient with chronic kidney disease on dialysis also prior history of CVA and diabetes. Sent from [facility] skilled nursing facility because of hemoglobin lower than baseline. Patient's sister came with the patient as he was able to give limited history. No apparent other symptomatology prior to arrival such as diarrhea cough or abdominal pain or shortness of breath. He is known to be bed bound and has multiple decubiti. She reports these are well cared for at the nursing facility. In the emergency department he was noted to be borderline hypotensive with a systolic blood pressure 104 respiratory 24 and tachycardic with heart rate of 120 he was afebrile. His potassium was low at 2.9 and leukocytosis of 25,000 fecal occult blood was negative baseline hemoglobin 10 and presentation hemoglobin 8.  Assessment/Plan:    Sepsis  -primarily 2/2 cdiff colitis  -BP better-did receive IVF's briefly -PCT 3.84 with normal lactic acid - resolved      Clostridium difficile colitis - severe type  Probably the source for sepsis.  -on oral Vancomycin and po Flagyl since 5/20.  - patient with copious liquid diarrhea - placed flexiseal 12/30/12     Diabetes mellitus with end stage renal disease -cbg well controlled -SSI    HYPERTENSION -BP controlled and NOT on meds pre admit    CKD (chronic kidney disease) stage V requiring chronic dialysis -per nephrology. Had HD 12/29/12      Anemia of chronic disease -PRBC's for hgb 8- baseline 10    History of CVA (cerebrovascular accident) -with vascular dementia -patient with rigidity, history of shuffled gait, tremors -  - started empiric Sinemet 100 BID on 12/31/12    Decubitus ulcer of buttock, stage 3/Decubitus ulcer of heel, stage 2   -undergoing hydrotherapy.  -air mattress  Hypokalemia from severe diarrhea -  repleting and resolved DVT prophylaxis: SCDs Code Status: Full code Family Communication:patient  Disposition Plan: SNF  Isolation: Contact isolation for C. difficile-positive status  Consultants: Nephrology  Procedures: None  Antibiotics: Zosyn 5/19 >>> 5/20 Vancomycin 5/19 >>> 5/20 Oral vancomycin 5/20 >>> IV Flagyl 5/20 >>>  HPI/Subjective: Patient awake.   Objective: Blood pressure 144/58, pulse 88, temperature 98.4 F (36.9 C), temperature source Oral, resp. rate 18, height 6\' 4"  (1.93 m), weight 82.3 kg (181 lb 7 oz), SpO2 96.00%.  Intake/Output Summary (Last 24 hours) at 12/31/12 1331 Last data filed at 12/31/12 0839  Gross per 24 hour  Intake    240 ml  Output    100 ml  Net    140 ml    Patient Vitals for the past 24 hrs:  BP Temp Temp src Pulse Resp SpO2 Weight  12/31/12 0848 144/58 mmHg 98.4 F (36.9 C) Oral 88 18 96 % -  12/31/12 0418 158/82 mmHg 97.8 F (36.6 C) Oral 92 18 95 % -  12/30/12 2128 130/66 mmHg 98.7 F (37.1 C) Oral 96 20 95 % 82.3 kg (181 lb 7 oz)  12/30/12 1744 132/74 mmHg 97.5 F (36.4 C) Oral 90 20 95 % -  12/30/12 1400 130/80 mmHg 97.4 F (36.3 C) Oral 87 20 95 % -     Exam: General: No acute respiratory distress Lungs: Clear to auscultation bilaterally without wheezes or crackles, RA Cardiovascular: Regular rate and rhythm without murmur gallop or rub normal S1 and S2, no peripheral edema or JVD Abdomen: Nontender, nondistended, soft, bowel sounds  positive, no rebound, no ascites, no appreciable mass, diarrhea Has deep unstageable wound on buttock Contractures   Data Reviewed: Basic Metabolic Panel:  Recent Labs Lab 12/27/12 1836 12/28/12 0450 12/29/12 0658 12/30/12 1156  NA 137 137 135 141  K 2.9* 3.3* 2.9* 4.5  CL 95* 98 97 102  CO2 26 26 25 28   GLUCOSE 126* 106* 122* 129*  BUN 17 23 37* 30*  CREATININE 3.05* 3.98* 5.49* 3.93*  CALCIUM 9.2 9.4 9.5 9.5  PHOS  --   --  3.5  --    Liver Function Tests:  Recent  Labs Lab 12/27/12 1836 12/28/12 0450  AST 30 27  ALT 34 27  ALKPHOS 75 60  BILITOT 0.5 0.3  PROT 8.1 6.5  ALBUMIN 2.0* 1.7*   No results found for this basename: LIPASE, AMYLASE,  in the last 168 hours No results found for this basename: AMMONIA,  in the last 168 hours CBC:  Recent Labs Lab 12/27/12 1804 12/28/12 0428 12/28/12 1757 12/29/12 0658 12/30/12 0525  WBC 25.4* 20.6* 22.5* 21.5* 17.4*  NEUTROABS 22.6* 17.8*  --   --   --   HGB 8.1* 6.7* 8.8* 7.8* 8.5*  HCT 24.3* 20.1* 25.7* 23.0* 25.4*  MCV 82.4 83.4 82.4 82.1 84.9  PLT 533* 449* 445* 423* 462*   Cardiac Enzymes:  Recent Labs Lab 12/27/12 2305  TROPONINI <0.30   BNP (last 3 results) No results found for this basename: PROBNP,  in the last 8760 hours CBG:  Recent Labs Lab 12/30/12 1137 12/30/12 1659 12/30/12 2127 12/31/12 0802 12/31/12 1228  GLUCAP 129* 101* 114* 98 119*    Recent Results (from the past 240 hour(s))  CULTURE, BLOOD (ROUTINE X 2)     Status: None   Collection Time    12/27/12  8:55 PM      Result Value Range Status   Specimen Description BLOOD HAND RIGHT   Final   Special Requests BOTTLES DRAWN AEROBIC AND ANAEROBIC 6CC   Final   Culture  Setup Time 12/28/2012 01:49   Final   Culture     Final   Value:        BLOOD CULTURE RECEIVED NO GROWTH TO DATE CULTURE WILL BE HELD FOR 5 DAYS BEFORE ISSUING A FINAL NEGATIVE REPORT   Report Status PENDING   Incomplete  CULTURE, BLOOD (ROUTINE X 2)     Status: None   Collection Time    12/27/12  9:00 PM      Result Value Range Status   Specimen Description BLOOD ARM RIGHT   Final   Special Requests BOTTLES DRAWN AEROBIC ONLY 5CC   Final   Culture  Setup Time 12/28/2012 01:51   Final   Culture     Final   Value:        BLOOD CULTURE RECEIVED NO GROWTH TO DATE CULTURE WILL BE HELD FOR 5 DAYS BEFORE ISSUING A FINAL NEGATIVE REPORT   Report Status PENDING   Incomplete  MRSA PCR SCREENING     Status: None   Collection Time    12/27/12  10:50 PM      Result Value Range Status   MRSA by PCR NEGATIVE  NEGATIVE Final   Comment:            The GeneXpert MRSA Assay (FDA     approved for NASAL specimens     only), is one component of a     comprehensive MRSA colonization     surveillance  program. It is not     intended to diagnose MRSA     infection nor to guide or     monitor treatment for     MRSA infections.  URINE CULTURE     Status: None   Collection Time    12/28/12 12:05 AM      Result Value Range Status   Specimen Description URINE, CATHETERIZED   Final   Special Requests NONE   Final   Culture  Setup Time 12/28/2012 04:58   Final   Colony Count NO GROWTH   Final   Culture NO GROWTH   Final   Report Status 12/29/2012 FINAL   Final  CLOSTRIDIUM DIFFICILE BY PCR     Status: Abnormal   Collection Time    12/28/12  1:26 AM      Result Value Range Status   C difficile by pcr POSITIVE (*) NEGATIVE Final   Comment: CRITICAL RESULT CALLED TO, READ BACK BY AND VERIFIED WITH:     J RESUELLO,RN 12/28/12 0850 BY K SCHULTZ     Studies:  Recent x-ray studies have been reviewed in detail by the Attending Physician  Scheduled Meds:  . cinacalcet  30 mg Oral BID WC  . collagenase   Topical Daily  . darbepoetin  100 mcg Intravenous Q Fri-HD  . dipyridamole-aspirin  1 capsule Oral BID  . feeding supplement (NEPRO CARB STEADY)  237 mL Oral BID BM  . feeding supplement  30 mL Oral TID WC  . gabapentin  100 mg Oral Daily  . insulin aspart  0-5 Units Subcutaneous QHS  . insulin aspart  0-9 Units Subcutaneous TID WC  . lanthanum  1,000 mg Oral TID WC  . metroNIDAZOLE  500 mg Oral Q8H  . multivitamin  1 tablet Oral Daily  . omega-3 acid ethyl esters  1 g Oral BID  . ondansetron (ZOFRAN) IV  4 mg Intravenous Once  . vancomycin  500 mg Oral Q6H      Patrick Urbanczyk,MD [phone] 12/31/2012, 1:31 PM

## 2012-12-31 NOTE — Progress Notes (Signed)
Pt with severe pain during hydrotherapy, incontinent bladder and of moderate amount of loose/mucous-like dark stool during procedure via flexiseal. Pt in severe pain during and after procedure per PT. Upon assessment, pt entire body is rigid, teeth clenched, tearful, despite 2mg  of IV morphine and 5 mg PO oxycodone. Pt may require increased medication dose to tolerate continued therapy. Pt was able to do active ROM BUE/BLE and to sit and stand on side of bed with significant encouragement from PT. MD notified of pain during procedure, will continue to monitor.

## 2012-12-31 NOTE — Progress Notes (Signed)
Physical Therapy Treatment Patient Details Name: [name] MRN: [id number] DOB: 1931/04/30 Today's Date: 12/31/2012 Time: 4098-1191 PT Time Calculation (min): 27 min  PT Assessment / Plan / Recommendation Comments on Treatment Session  Initiated mobility to EOB and standing trial and EOB after warm up ROM.  Pt has some rehab to do before he will easily transfer bed to chair.    Follow Up Recommendations  SNF     Does the patient have the potential to tolerate intense rehabilitation     Barriers to Discharge        Equipment Recommendations  Other (comment) (TBA post acute)    Recommendations for Other Services    Frequency     Plan Discharge plan remains appropriate;Frequency remains appropriate    Precautions / Restrictions Precautions Precautions: Fall Restrictions Weight Bearing Restrictions: No   Pertinent Vitals/Pain     Mobility  Bed Mobility Bed Mobility: Rolling Left;Supine to Sit;Sitting - Scoot to [place] of Bed;Sit to Supine Rolling Right: 1: +2 Total assist Rolling Right: Patient Percentage: 10% Rolling Left: 1: +2 Total assist Rolling Left: Patient Percentage: 30% Supine to Sit: 1: +2 Total assist Supine to Sit: Patient Percentage: 30% Sitting - Scoot to Edge of Bed: 1: +2 Total assist Sitting - Scoot to Edge of Bed: Patient Percentage: 30% Sit to Supine: 1: +2 Total assist Sit to Supine: Patient Percentage: 10% Details for Bed Mobility Assistance: Continues to be resistant to moving due to pain and fears Transfers Transfers: Sit to Stand;Stand to Sit Sit to Stand: 1: +2 Total assist;With upper extremity assist;From bed Sit to Stand: Patient Percentage: 30% Stand to Sit: 1: +2 Total assist;With upper extremity assist;To bed Stand to Sit: Patient Percentage: 30% Details for Transfer Assistance: attained standing with significant 2 person assist and belt for assist Ambulation/Gait Ambulation/Gait Assistance: Not tested (comment)    Exercises General  Exercises - Upper Extremity Elbow Flexion: Strengthening;Both;10 reps;Supine Elbow Extension: Strengthening;Both;10 reps;Supine General Exercises - Lower Extremity Heel Slides: Strengthening;10 reps;Both;Supine Other Exercises Other Exercises: general muscle stretch around knee/hips and shouldersw during AAROM all 4's   PT Diagnosis:    PT Problem List:   PT Treatment Interventions:     PT Goals Acute Rehab PT Goals Potential to Achieve Goals: Good Pt will Roll Supine to Right Side: with mod assist PT Goal: Rolling Supine to Right Side - Progress: Progressing toward goal Pt will Roll Supine to Left Side: with min assist PT Goal: Rolling Supine to Left Side - Progress: Progressing toward goal Pt will go Supine/Side to Sit: with mod assist PT Goal: Supine/Side to Sit - Progress: Progressing toward goal Pt will go Sit to Stand: with max assist PT Goal: Sit to Stand - Progress: Progressing toward goal Pt will Transfer Bed to Chair/Chair to Bed: with max assist PT Transfer Goal: Bed to Chair/Chair to Bed - Progress: Progressing toward goal  Visit Information  Last PT Received On: 12/31/12 Assistance Needed: +2    Subjective Data  Patient Stated Goal: I want to be able to sit up for dialysis   Cognition  Cognition Arousal/Alertness: Awake/alert Behavior During Therapy: Restless Overall Cognitive Status: Impaired/Different from baseline Area of Impairment: Orientation;Following commands;Safety/judgement;Problem solving Following Commands: Follows one step commands with increased time Safety/Judgement: Decreased awareness of deficits Problem Solving: Slow processing    Balance  Balance Balance Assessed: Yes Static Sitting Balance Static Sitting - Balance Support: Feet supported;Bilateral upper extremity supported Static Sitting - Level of Assistance: 4: Min assist;3: Mod assist;Other (comment) (  after each transition pt needs more assist, then acclamates) Static Sitting -  Comment/# of Minutes: 18 min  working on sitting tolerance and balance.  End of Session PT - End of Session Equipment Utilized During Treatment: Gait belt Activity Tolerance: Patient limited by pain;Other (comment) (and fear) Patient left: in bed;with call bell/phone within reach;Other (comment) Nurse Communication: Mobility status   GP     Sumi Lye, [name] 12/31/2012, 12:32 PM 12/31/2012  [place] Bing, PT [phone]  (pager)

## 2013-01-01 DIAGNOSIS — E1129 Type 2 diabetes mellitus with other diabetic kidney complication: Secondary | ICD-10-CM

## 2013-01-01 LAB — RENAL FUNCTION PANEL
Albumin: 1.8 g/dL — ABNORMAL LOW (ref 3.5–5.2)
BUN: 64 mg/dL — ABNORMAL HIGH (ref 6–23)
CO2: 25 mEq/L (ref 19–32)
Calcium: 9.8 mg/dL (ref 8.4–10.5)
Chloride: 99 mEq/L (ref 96–112)
Creatinine, Ser: 6.36 mg/dL — ABNORMAL HIGH (ref 0.50–1.35)
GFR calc Af Amer: 9 mL/min — ABNORMAL LOW (ref >90)
GFR calc non Af Amer: 7 mL/min — ABNORMAL LOW (ref >90)
Glucose, Bld: 103 mg/dL — ABNORMAL HIGH (ref 70–99)
Phosphorus: 3.3 mg/dL (ref 2.3–4.6)
Potassium: 4.1 mEq/L (ref 3.5–5.1)
Sodium: 136 mEq/L (ref 135–145)

## 2013-01-01 LAB — CBC
HCT: 23.7 % — ABNORMAL LOW (ref 39.0–52.0)
Hemoglobin: 7.9 g/dL — ABNORMAL LOW (ref 13.0–17.0)
RBC: 2.81 MIL/uL — ABNORMAL LOW (ref 4.22–5.81)
WBC: 15.4 10*3/uL — ABNORMAL HIGH (ref 4.0–10.5)

## 2013-01-01 LAB — GLUCOSE, CAPILLARY
Glucose-Capillary: 101 mg/dL — ABNORMAL HIGH (ref 70–99)
Glucose-Capillary: 134 mg/dL — ABNORMAL HIGH (ref 70–99)

## 2013-01-01 MED ORDER — SODIUM CHLORIDE 0.9 % IV SOLN: 100.0000 mL | INTRAVENOUS | Status: DC | PRN

## 2013-01-01 MED ORDER — ALTEPLASE 2 MG IJ SOLR: 2.0000 mg | Freq: Once | INTRAMUSCULAR | Status: DC | PRN

## 2013-01-01 MED ORDER — LIDOCAINE-PRILOCAINE 2.5-2.5 % EX CREA: 1.0000 "application " | TOPICAL_CREAM | CUTANEOUS | Status: DC | PRN

## 2013-01-01 MED ORDER — PENTAFLUOROPROP-TETRAFLUOROETH EX AERO: 1.0000 "application " | INHALATION_SPRAY | CUTANEOUS | Status: DC | PRN

## 2013-01-01 MED ORDER — LIDOCAINE HCL (PF) 1 % IJ SOLN: 5.0000 mL | INTRAMUSCULAR | Status: DC | PRN

## 2013-01-01 MED ORDER — OXYCODONE HCL 5 MG PO TABS
ORAL_TABLET | ORAL | Status: AC
  Administered 2013-01-01: 5 mg via ORAL
  Filled 2013-01-01: qty 1

## 2013-01-01 MED ORDER — HEPARIN SODIUM (PORCINE) 1000 UNIT/ML DIALYSIS
2000.0000 [IU] | Freq: Once | INTRAMUSCULAR | Status: AC
  Administered 2013-01-01: 2000 [IU] via INTRAVENOUS_CENTRAL

## 2013-01-01 MED ORDER — HEPARIN SODIUM (PORCINE) 1000 UNIT/ML DIALYSIS: 1000.0000 [IU] | INTRAMUSCULAR | Status: DC | PRN

## 2013-01-01 MED ORDER — NEPRO/CARBSTEADY PO LIQD: 237.0000 mL | ORAL | Status: DC | PRN

## 2013-01-01 MED ORDER — DARBEPOETIN ALFA-POLYSORBATE 100 MCG/0.5ML IJ SOLN
INTRAMUSCULAR | Status: AC
  Administered 2013-01-01: 100 ug via INTRAVENOUS
  Filled 2013-01-01: qty 0.5

## 2013-01-01 NOTE — Progress Notes (Signed)
PT Cancellation Note  Patient Details Name: [name] MRN: [id number] DOB: 1931/03/30   Cancelled Treatment:    Reason Eval/Treat Not Completed: Other (comment) (Pt has decided to stop HD so will D/C hydrotherapy per Dr. Madaline Savage order 01/01/2013  [name], PT [phone]  (pager)   [name], [name] 01/01/2013, 3:33 PM

## 2013-01-01 NOTE — Progress Notes (Signed)
Had a conversation with [name] about his medical condition near the end of dialysis today.  After the discussion he said he wanted to "stop everything", that he was "ready to go", "my time has come".  I asked him several different times and he seemed fully aware of the consequences of stopping medical care.  He asked me to call his family and talk with his son. I talked with his son [name] and gave him this information.  [name] said he will be in today to see his father.  I have put hydrotherapy on hold for now.  Discussed with Dr Lavera Guise.   [name]  MD [phone] pgr    [phone] cell 01/01/2013, 12:13 PM

## 2013-01-01 NOTE — Progress Notes (Signed)
[facility]    Wyeth Hoffer ZOX:[id number] DOB: [birth year] DOA: 12/27/2012 PCP: Crawford Givens, MD  Brief narrative: 79 year old male patient with chronic kidney disease on dialysis also prior history of CVA and diabetes. Sent from [facility] skilled nursing facility because of hemoglobin lower than baseline. Patient's sister came with the patient as he was able to give limited history. No apparent other symptomatology prior to arrival such as diarrhea cough or abdominal pain or shortness of breath. He is known to be bed bound and has multiple decubiti. She reports these are well cared for at the nursing facility. In the emergency department he was noted to be borderline hypotensive with a systolic blood pressure 104 respiratory 24 and tachycardic with heart rate of 120 he was afebrile. His potassium was low at 2.9 and leukocytosis of 25,000 fecal occult blood was negative baseline hemoglobin 10 and presentation hemoglobin 8.  Assessment/Plan:    Sepsis  -primarily 2/2 cdiff colitis  -BP better-did receive IVF's briefly -PCT 3.84 with normal lactic acid - resolved      Clostridium difficile colitis - severe type  Probably the source for sepsis.  -on oral Vancomycin and po Flagyl since 5/20.  - patient with copious liquid diarrhea - placed flexiseal 12/30/12     Diabetes mellitus with end stage renal disease -cbg well controlled -SSI    HYPERTENSION -BP controlled and NOT on meds pre admit    CKD (chronic kidney disease) stage V requiring chronic dialysis -per nephrology. Had HD 12/29/12      Anemia of chronic disease -PRBC's for hgb 8- baseline 10    History of CVA (cerebrovascular accident) -with vascular dementia -patient with rigidity, history of shuffled gait, tremors -  - started empiric Sinemet 100 BID on 12/31/12    Decubitus ulcer of buttock, stage 3/Decubitus ulcer of heel, stage 2   -undergoing hydrotherapy.  -air mattress  Hypokalemia from severe diarrhea -  repleting and resolved DVT prophylaxis: SCDs Code Status: Full code Family Communication:patient  Disposition Plan: SNF  Isolation: Contact isolation for C. difficile-positive status  Consultants: Nephrology  Procedures: None  Antibiotics: Zosyn 5/19 >>> 5/20 Vancomycin 5/19 >>> 5/20 Oral vancomycin 5/20 >>> IV Flagyl 5/20 >>>  HPI/Subjective: Getting hemodialysis  Objective: Blood pressure 128/76, pulse 92, temperature 97.9 F (36.6 C), temperature source Oral, resp. rate 18, height 6\' 4"  (1.93 m), weight 82.5 kg (181 lb 14.1 oz), SpO2 96.00%.  Intake/Output Summary (Last 24 hours) at 01/01/13 1128 Last data filed at 12/31/12 1500  Gross per 24 hour  Intake    240 ml  Output      0 ml  Net    240 ml    Patient Vitals for the past 24 hrs:  BP Temp Temp src Pulse Resp SpO2 Weight  01/01/13 1100 128/76 mmHg - - 92 - - -  01/01/13 1030 129/68 mmHg - - 93 - - -  01/01/13 1000 136/75 mmHg - - 94 - - -  01/01/13 0930 124/71 mmHg - - 82 - - -  01/01/13 0900 147/79 mmHg - - 97 - - -  01/01/13 0830 123/75 mmHg - - 83 - - -  01/01/13 0807 131/79 mmHg - - 95 - - -  01/01/13 0754 144/79 mmHg 97.9 F (36.6 C) Oral 100 18 96 % 82.5 kg (181 lb 14.1 oz)  01/01/13 0627 135/69 mmHg 97.9 F (36.6 C) Oral 96 18 96 % -  12/31/12 2153 143/72 mmHg 98.4 F (36.9 C) Oral 107 20  95 % 83.7 kg (184 lb 8.4 oz)  12/31/12 1739 142/67 mmHg 98.1 F (36.7 C) Oral 89 18 96 % -  12/31/12 1400 135/64 mmHg 98 F (36.7 C) Oral 86 18 96 % -     Exam: General: No acute respiratory distress Lungs: Clear to auscultation bilaterally without wheezes or crackles, RA Cardiovascular: Regular rate and rhythm without murmur gallop or rub normal S1 and S2, no peripheral edema or JVD Abdomen: Nontender, nondistended, soft, bowel sounds positive, no rebound, no ascites, no appreciable mass, diarrhea Has deep unstageable wound on buttock Contractures   Data Reviewed: Basic Metabolic Panel:  Recent  Labs Lab 12/27/12 1836 12/28/12 0450 12/29/12 0658 12/30/12 1156 01/01/13 0830  NA 137 137 135 141 136  K 2.9* 3.3* 2.9* 4.5 4.1  CL 95* 98 97 102 99  CO2 26 26 25 28 25   GLUCOSE 126* 106* 122* 129* 103*  BUN 17 23 37* 30* 64*  CREATININE 3.05* 3.98* 5.49* 3.93* 6.36*  CALCIUM 9.2 9.4 9.5 9.5 9.8  PHOS  --   --  3.5  --  3.3   Liver Function Tests:  Recent Labs Lab 12/27/12 1836 12/28/12 0450 01/01/13 0830  AST 30 27  --   ALT 34 27  --   ALKPHOS 75 60  --   BILITOT 0.5 0.3  --   PROT 8.1 6.5  --   ALBUMIN 2.0* 1.7* 1.8*   No results found for this basename: LIPASE, AMYLASE,  in the last 168 hours No results found for this basename: AMMONIA,  in the last 168 hours CBC:  Recent Labs Lab 12/27/12 1804 12/28/12 0428 12/28/12 1757 12/29/12 0658 12/30/12 0525 01/01/13 0800  WBC 25.4* 20.6* 22.5* 21.5* 17.4* 15.4*  NEUTROABS 22.6* 17.8*  --   --   --   --   HGB 8.1* 6.7* 8.8* 7.8* 8.5* 7.9*  HCT 24.3* 20.1* 25.7* 23.0* 25.4* 23.7*  MCV 82.4 83.4 82.4 82.1 84.9 84.3  PLT 533* 449* 445* 423* 462* 460*   Cardiac Enzymes:  Recent Labs Lab 12/27/12 2305  TROPONINI <0.30   BNP (last 3 results) No results found for this basename: PROBNP,  in the last 8760 hours CBG:  Recent Labs Lab 12/31/12 0802 12/31/12 1228 12/31/12 1745 12/31/12 2152 01/01/13 0725  GLUCAP 98 119* 117* 140* 101*    Recent Results (from the past 240 hour(s))  CULTURE, BLOOD (ROUTINE X 2)     Status: None   Collection Time    12/27/12  8:55 PM      Result Value Range Status   Specimen Description BLOOD HAND RIGHT   Final   Special Requests BOTTLES DRAWN AEROBIC AND ANAEROBIC 6CC   Final   Culture  Setup Time 12/28/2012 01:49   Final   Culture     Final   Value:        BLOOD CULTURE RECEIVED NO GROWTH TO DATE CULTURE WILL BE HELD FOR 5 DAYS BEFORE ISSUING A FINAL NEGATIVE REPORT   Report Status PENDING   Incomplete  CULTURE, BLOOD (ROUTINE X 2)     Status: None   Collection Time     12/27/12  9:00 PM      Result Value Range Status   Specimen Description BLOOD ARM RIGHT   Final   Special Requests BOTTLES DRAWN AEROBIC ONLY 5CC   Final   Culture  Setup Time 12/28/2012 01:51   Final   Culture     Final   Value:  BLOOD CULTURE RECEIVED NO GROWTH TO DATE CULTURE WILL BE HELD FOR 5 DAYS BEFORE ISSUING A FINAL NEGATIVE REPORT   Report Status PENDING   Incomplete  MRSA PCR SCREENING     Status: None   Collection Time    12/27/12 10:50 PM      Result Value Range Status   MRSA by PCR NEGATIVE  NEGATIVE Final   Comment:            The GeneXpert MRSA Assay (FDA     approved for NASAL specimens     only), is one component of a     comprehensive MRSA colonization     surveillance program. It is not     intended to diagnose MRSA     infection nor to guide or     monitor treatment for     MRSA infections.  URINE CULTURE     Status: None   Collection Time    12/28/12 12:05 AM      Result Value Range Status   Specimen Description URINE, CATHETERIZED   Final   Special Requests NONE   Final   Culture  Setup Time 12/28/2012 04:58   Final   Colony Count NO GROWTH   Final   Culture NO GROWTH   Final   Report Status 12/29/2012 FINAL   Final  CLOSTRIDIUM DIFFICILE BY PCR     Status: Abnormal   Collection Time    12/28/12  1:26 AM      Result Value Range Status   C difficile by pcr POSITIVE (*) NEGATIVE Final   Comment: CRITICAL RESULT CALLED TO, READ BACK BY AND VERIFIED WITH:     J RESUELLO,RN 12/28/12 0850 BY K SCHULTZ     Studies:  Recent x-ray studies have been reviewed in detail by the Attending Physician  Scheduled Meds:  . acetaminophen  500 mg Oral QID  . carbidopa-levodopa  1 tablet Oral BID  . cinacalcet  30 mg Oral BID WC  . collagenase   Topical Daily  . darbepoetin  100 mcg Intravenous Q Fri-HD  . dipyridamole-aspirin  1 capsule Oral BID  . feeding supplement (NEPRO CARB STEADY)  237 mL Oral BID BM  . feeding supplement  30 mL Oral TID WC  .  gabapentin  100 mg Oral Daily  . insulin aspart  0-5 Units Subcutaneous QHS  . insulin aspart  0-9 Units Subcutaneous TID WC  . lanthanum  1,000 mg Oral TID WC  . multivitamin  1 tablet Oral Daily  . omega-3 acid ethyl esters  1 g Oral BID  . ondansetron (ZOFRAN) IV  4 mg Intravenous Once  . vancomycin  500 mg Oral Q6H      Usama Harkless,MD [phone] 01/01/2013, 11:28 AM

## 2013-01-01 NOTE — Progress Notes (Signed)
[facility] Progress Note  Subjective:   Denies pain, no diarrhea. Appetite ok.  Objective Filed Vitals:   01/01/13 0754 01/01/13 0807 01/01/13 0830 01/01/13 0900  BP: 144/79 131/79 123/75 147/79  Pulse: 100 95 83 97  Temp: 97.9 F (36.6 C)     TempSrc: Oral     Resp: 18     Height:      Weight: 82.5 kg (181 lb 14.1 oz)     SpO2: 96%      Physical Exam Goal  BP 147/79 goal 2.5  General: NAD on HD Heart: RRR no rub Lungs: clear without wheezes or rales Abdomen: soft NT Extremities: no LE edema, SCDs on Dialysis Access: LFA AVF Qb 400  [facility]: [facility] 3hr 45 min EDW 83.5 kg 2K/2CA [place] LFA AVF BFR 500/ DFR A1.5 Epo 15000u, Heparin 5000 u. Venofer 100 mg IV/HD through 12/29/12   Assessment/Plan:  1. Stage IV sacral decub- local Rx, per primary. Did not tolerate hydrotherapy, likely requiring more analgesia per PT 2. C Diff- on po vanc/contact precautions  3. ESRD,  MWF - out patient - off schedule- HD postponed from yesterday to today due to staffing - plan next HD Monday per routine 4. HTN/volume- no vol excess, below dry wt, SBP's 130's-150s.Try for UF 2. L today. 5. Anemia of CKD- Hgb 8.5 down to 7.9 s/p 2u prbc on 5/20, will give darbe 100 ug on 5/24 w HD  6. Secondary HPTH- Ca 9.5. P 3.5 5/22 - labs pending. Continue sensipar and Fosrenol. 7. Nutrition - Albumin 1.7. Renal diet. On Nepro and Pro-stat to promote wound healing. Multivitamin 8. Debility- will likely not be able to do outpt dialysis in the short-term, LTAC will probably be required  9. EOL- full code and aggressive medical Rx for now. Discussed with primary MD  Sheffield Slider, PA-C [facility] [phone] 01/01/2013,9:12 AM  LOS: 5 days   Patient seen and examined.  Agree with assessment and plan as above. [name]  MD [phone] pgr    [phone] cell 01/01/2013, 11:29 AM    Additional Objective Labs: Basic Metabolic Panel:  Recent Labs Lab  12/28/12 0450 12/29/12 0658 12/30/12 1156  NA 137 135 141  K 3.3* 2.9* 4.5  CL 98 97 102  CO2 26 25 28   GLUCOSE 106* 122* 129*  BUN 23 37* 30*  CREATININE 3.98* 5.49* 3.93*  CALCIUM 9.4 9.5 9.5  PHOS  --  3.5  --    Liver Function Tests:  Recent Labs Lab 12/27/12 1836 12/28/12 0450  AST 30 27  ALT 34 27  ALKPHOS 75 60  BILITOT 0.5 0.3  PROT 8.1 6.5  ALBUMIN 2.0* 1.7*  CBC:  Recent Labs Lab 12/27/12 1804 12/28/12 0428 12/28/12 1757 12/29/12 0658 12/30/12 0525 01/01/13 0800  WBC 25.4* 20.6* 22.5* 21.5* 17.4* 15.4*  NEUTROABS 22.6* 17.8*  --   --   --   --   HGB 8.1* 6.7* 8.8* 7.8* 8.5* 7.9*  HCT 24.3* 20.1* 25.7* 23.0* 25.4* 23.7*  MCV 82.4 83.4 82.4 82.1 84.9 84.3  PLT 533* 449* 445* 423* 462* 460*  Cardiac Enzymes:  Recent Labs Lab 12/27/12 2305  TROPONINI <0.30   CBG:  Recent Labs Lab 12/31/12 0802 12/31/12 1228 12/31/12 1745 12/31/12 2152 01/01/13 0725  GLUCAP 98 119* 117* 140* 101*  Medications:   . acetaminophen  500 mg Oral QID  . carbidopa-levodopa  1 tablet Oral BID  . cinacalcet  30 mg Oral BID WC  . collagenase   Topical Daily  . darbepoetin  100 mcg Intravenous Q Fri-HD  . dipyridamole-aspirin  1 capsule Oral BID  . feeding supplement (NEPRO CARB STEADY)  237 mL Oral BID BM  . feeding supplement  30 mL Oral TID WC  . gabapentin  100 mg Oral Daily  . [START ON 01/02/2013] heparin  2,000 Units Dialysis Once in dialysis  . insulin aspart  0-5 Units Subcutaneous QHS  . insulin aspart  0-9 Units Subcutaneous TID WC  . lanthanum  1,000 mg Oral TID WC  . multivitamin  1 tablet Oral Daily  . omega-3 acid ethyl esters  1 g Oral BID  . ondansetron (ZOFRAN) IV  4 mg Intravenous Once  . vancomycin  500 mg Oral Q6H

## 2013-01-01 NOTE — Procedures (Signed)
I was present at this dialysis session. I have reviewed the session itself and made appropriate changes.   Rob Rasul Decola, MD  [facility] 01/01/2013, 11:28 AM   

## 2013-01-02 DIAGNOSIS — N186 End stage renal disease: Secondary | ICD-10-CM

## 2013-01-02 DIAGNOSIS — D638 Anemia in other chronic diseases classified elsewhere: Secondary | ICD-10-CM

## 2013-01-02 DIAGNOSIS — A0472 Enterocolitis due to Clostridium difficile, not specified as recurrent: Secondary | ICD-10-CM

## 2013-01-02 DIAGNOSIS — A419 Sepsis, unspecified organism: Secondary | ICD-10-CM

## 2013-01-02 LAB — GLUCOSE, CAPILLARY
Glucose-Capillary: 95 mg/dL (ref 70–99)
Glucose-Capillary: 141 mg/dL — ABNORMAL HIGH (ref 70–99)
Glucose-Capillary: 119 mg/dL — ABNORMAL HIGH (ref 70–99)

## 2013-01-02 NOTE — Progress Notes (Signed)
[facility]    [name] ZOX:[id number] DOB: May 03, 1931 DOA: 12/27/2012 PCP: Crawford Givens, MD  Brief narrative: 79 year old male patient with chronic kidney disease on dialysis also prior history of CVA and diabetes. Sent from [facility] skilled nursing facility because of hemoglobin lower than baseline. Patient's sister came with the patient as he was able to give limited history. No apparent other symptomatology prior to arrival such as diarrhea cough or abdominal pain or shortness of breath. He is known to be bed bound and has multiple decubiti. She reports these are well cared for at the nursing facility. In the emergency department he was noted to be borderline hypotensive with a systolic blood pressure 104 respiratory 24 and tachycardic with heart rate of 120 he was afebrile. His potassium was low at 2.9 and leukocytosis of 25,000 fecal occult blood was negative baseline hemoglobin 10 and presentation hemoglobin 8.  Assessment/Plan:    Sepsis  -primarily 2/2 cdiff colitis  -BP better-did receive IVF's briefly -PCT 3.84 with normal lactic acid - resolved      Clostridium difficile colitis - severe type  Probably the source for sepsis.  -on oral Vancomycin and po Flagyl since 5/20.  - patient with copious liquid diarrhea - placed flexiseal 12/30/12 - Slow improvement     Diabetes mellitus with end stage renal disease -cbg well controlled -SSI    HYPERTENSION -BP controlled and NOT on meds pre admit    CKD (chronic kidney disease) stage V requiring chronic dialysis -per nephrology. Patient and family discussed with Dr. Arlean Hopping stopping hemodialysis on May 24     Anemia of chronic disease -PRBC's for hgb 8- baseline 10    History of CVA (cerebrovascular accident) -with vascular dementia -patient with rigidity, history of shuffled gait, tremors -  - started empiric Sinemet 100 BID on 12/31/12    Decubitus ulcer of buttock, stage 3/Decubitus ulcer of heel, stage  2   -The patient received hydrotherapy daily during this admission until May 24.  He was placed on an air mattress  and had daily wound dressing changes  Hypokalemia from severe diarrhea - repleting and resolved DVT prophylaxis: SCDs Code Status: Full code Family Communication:patient  Disposition Plan: SNF  Isolation: Contact isolation for C. difficile-positive status  Consultants: Nephrology  Procedures: None  Antibiotics: Zosyn 5/19 >>> 5/20 Vancomycin 5/19 >>> 5/20 Oral vancomycin 5/20 >>> IV Flagyl 5/20 >>>  HPI/Subjective: Alert  Objective: Blood pressure 132/66, pulse 112, temperature 97.9 F (36.6 C), temperature source Oral, resp. rate 18, height 6\' 4"  (1.93 m), weight 82.4 kg (181 lb 10.5 oz), SpO2 95.00%.  Intake/Output Summary (Last 24 hours) at 01/02/13 1109 Last data filed at 01/01/13 1200  Gross per 24 hour  Intake      0 ml  Output   2000 ml  Net  -2000 ml    Patient Vitals for the past 24 hrs:  BP Temp Temp src Pulse Resp SpO2 Weight  01/02/13 0852 132/66 mmHg 97.9 F (36.6 C) Oral 112 18 95 % -  01/02/13 0604 118/62 mmHg 99 F (37.2 C) Oral 99 18 93 % -  01/01/13 2241 142/72 mmHg 98.6 F (37 C) Oral 99 18 97 % 82.4 kg (181 lb 10.5 oz)  01/01/13 1722 111/51 mmHg 97.5 F (36.4 C) Oral 103 18 96 % -  01/01/13 1253 130/68 mmHg 97.3 F (36.3 C) Oral 104 18 94 % -  01/01/13 1200 146/78 mmHg 97.5 F (36.4 C) Oral 101 18 95 % 81.2 kg (  179 lb 0.2 oz)  01/01/13 1130 143/79 mmHg - - 98 - - -     Exam: General: No acute respiratory distress Lungs: Clear to auscultation bilaterally without wheezes or crackles, RA Cardiovascular: Regular rate and rhythm without murmur gallop or rub normal S1 and S2, no peripheral edema or JVD Abdomen: Nontender, nondistended, soft, bowel sounds positive, no rebound, no ascites, no appreciable mass, diarrhea Has deep unstageable wound on buttock Contractures   Data Reviewed: Basic Metabolic Panel:  Recent  Labs Lab 12/27/12 1836 12/28/12 0450 12/29/12 0658 12/30/12 1156 01/01/13 0830  NA 137 137 135 141 136  K 2.9* 3.3* 2.9* 4.5 4.1  CL 95* 98 97 102 99  CO2 26 26 25 28 25   GLUCOSE 126* 106* 122* 129* 103*  BUN 17 23 37* 30* 64*  CREATININE 3.05* 3.98* 5.49* 3.93* 6.36*  CALCIUM 9.2 9.4 9.5 9.5 9.8  PHOS  --   --  3.5  --  3.3   Liver Function Tests:  Recent Labs Lab 12/27/12 1836 12/28/12 0450 01/01/13 0830  AST 30 27  --   ALT 34 27  --   ALKPHOS 75 60  --   BILITOT 0.5 0.3  --   PROT 8.1 6.5  --   ALBUMIN 2.0* 1.7* 1.8*   No results found for this basename: LIPASE, AMYLASE,  in the last 168 hours No results found for this basename: AMMONIA,  in the last 168 hours CBC:  Recent Labs Lab 12/27/12 1804 12/28/12 0428 12/28/12 1757 12/29/12 0658 12/30/12 0525 01/01/13 0800  WBC 25.4* 20.6* 22.5* 21.5* 17.4* 15.4*  NEUTROABS 22.6* 17.8*  --   --   --   --   HGB 8.1* 6.7* 8.8* 7.8* 8.5* 7.9*  HCT 24.3* 20.1* 25.7* 23.0* 25.4* 23.7*  MCV 82.4 83.4 82.4 82.1 84.9 84.3  PLT 533* 449* 445* 423* 462* 460*   Cardiac Enzymes:  Recent Labs Lab 12/27/12 2305  TROPONINI <0.30   BNP (last 3 results) No results found for this basename: PROBNP,  in the last 8760 hours CBG:  Recent Labs Lab 12/31/12 2152 01/01/13 0725 01/01/13 1305 01/01/13 1724 01/02/13 0733  GLUCAP 140* 101* 89 134* 144*    Recent Results (from the past 240 hour(s))  CULTURE, BLOOD (ROUTINE X 2)     Status: None   Collection Time    12/27/12  8:55 PM      Result Value Range Status   Specimen Description BLOOD HAND RIGHT   Final   Special Requests BOTTLES DRAWN AEROBIC AND ANAEROBIC 6CC   Final   Culture  Setup Time 12/28/2012 01:49   Final   Culture     Final   Value:        BLOOD CULTURE RECEIVED NO GROWTH TO DATE CULTURE WILL BE HELD FOR 5 DAYS BEFORE ISSUING A FINAL NEGATIVE REPORT   Report Status PENDING   Incomplete  CULTURE, BLOOD (ROUTINE X 2)     Status: None   Collection Time     12/27/12  9:00 PM      Result Value Range Status   Specimen Description BLOOD ARM RIGHT   Final   Special Requests BOTTLES DRAWN AEROBIC ONLY 5CC   Final   Culture  Setup Time 12/28/2012 01:51   Final   Culture     Final   Value:        BLOOD CULTURE RECEIVED NO GROWTH TO DATE CULTURE WILL BE HELD FOR 5 DAYS BEFORE ISSUING  A FINAL NEGATIVE REPORT   Report Status PENDING   Incomplete  MRSA PCR SCREENING     Status: None   Collection Time    12/27/12 10:50 PM      Result Value Range Status   MRSA by PCR NEGATIVE  NEGATIVE Final   Comment:            The GeneXpert MRSA Assay (FDA     approved for NASAL specimens     only), is one component of a     comprehensive MRSA colonization     surveillance program. It is not     intended to diagnose MRSA     infection nor to guide or     monitor treatment for     MRSA infections.  URINE CULTURE     Status: None   Collection Time    12/28/12 12:05 AM      Result Value Range Status   Specimen Description URINE, CATHETERIZED   Final   Special Requests NONE   Final   Culture  Setup Time 12/28/2012 04:58   Final   Colony Count NO GROWTH   Final   Culture NO GROWTH   Final   Report Status 12/29/2012 FINAL   Final  CLOSTRIDIUM DIFFICILE BY PCR     Status: Abnormal   Collection Time    12/28/12  1:26 AM      Result Value Range Status   C difficile by pcr POSITIVE (*) NEGATIVE Final   Comment: CRITICAL RESULT CALLED TO, READ BACK BY AND VERIFIED WITH:     J RESUELLO,RN 12/28/12 0850 BY K SCHULTZ     Studies:  Recent x-ray studies have been reviewed in detail by the Attending Physician  Scheduled Meds:  . acetaminophen  500 mg Oral QID  . carbidopa-levodopa  1 tablet Oral BID  . collagenase   Topical Daily  . dipyridamole-aspirin  1 capsule Oral BID  . feeding supplement (NEPRO CARB STEADY)  237 mL Oral BID BM  . gabapentin  100 mg Oral Daily  . insulin aspart  0-5 Units Subcutaneous QHS  . insulin aspart  0-9 Units Subcutaneous  TID WC  . multivitamin  1 tablet Oral Daily  . omega-3 acid ethyl esters  1 g Oral BID  . ondansetron (ZOFRAN) IV  4 mg Intravenous Once  . vancomycin  500 mg Oral Q6H      Patrick Garlitz,MD [phone] 01/02/2013, 11:09 AM

## 2013-01-02 NOTE — Progress Notes (Signed)
[facility] Progress Note  Subjective:   Still wishes to stop HD. Had conversation with one or all of his children yesterday (he wasn't sure). He said they were ok with his decision to stop dialysis. Thinks he can go home to live with wife and get himself a scooter to get around.  Objective Filed Vitals:   01/01/13 1253 01/01/13 1722 01/01/13 2241 01/02/13 0604  BP: 130/68 111/51 142/72 118/62  Pulse: 104 103 99 99  Temp: 97.3 F (36.3 C) 97.5 F (36.4 C) 98.6 F (37 C) 99 F (37.2 C)  TempSrc: Oral Oral Oral Oral  Resp: 18 18 18 18   Height:      Weight:   82.4 kg (181 lb 10.5 oz)   SpO2: 94% 96% 97% 93%   Physical Exam General: NAD Heart: reg tachy - ~120 Lungs: no wheezes or rales Abdomen: soft NT Extremities: SCDs; boot on right foot; tip of great toe dark Dialysis Access: left AVF   Assessment/Plan: 1. EOL issues - had additional discussion with patient to clarify his decision; explained to him that his time would be short +/- a week - it depends; feels like he has lived a good life and is the longest living "Deans" at age 79.  Expectations not fully realistic regarding mobility after discharge. Had family meeting with all of family members yesterday afternoon and the plan is for hospice care.  I mentioned that residential hospice would be most likely avenue; home hospice was brought up by daughter but other family members noted that there would be no one to take care of him at home and it was left that we will be pursuing hospice care probably in a residential hospice setting and all family members were in agreement. Have discussed with Dr Lavera Guise today; would recommend to go ahead and initiate hospice care and placement, stop noncomfort meds, etc., at this time.  2. ESRD - last dialysis Saturday 5/24 3. Anemia - d/c Aranesp 4. Secondary hyperparathyroidism - d/c binders, sensipar 5. Nutrition - liberalize to regular diet 6. Disp - renal team will sign off, but  would be glad to continue with any further discussion with the patient or family as needed.  Sheffield Slider, PA-C [facility] [phone] 01/02/2013,8:43 AM  LOS: 6 days   Patient seen and examined.  I agree with plan as above with additions as indicated. [name]  MD [phone] pgr    [phone] cell 01/02/2013, 11:36 AM   Additional Objective Labs: Basic Metabolic Panel:  Recent Labs Lab 12/29/12 0658 12/30/12 1156 01/01/13 0830  NA 135 141 136  K 2.9* 4.5 4.1  CL 97 102 99  CO2 25 28 25   GLUCOSE 122* 129* 103*  BUN 37* 30* 64*  CREATININE 5.49* 3.93* 6.36*  CALCIUM 9.5 9.5 9.8  PHOS 3.5  --  3.3   Liver Function Tests:  Recent Labs Lab 12/27/12 1836 12/28/12 0450 01/01/13 0830  AST 30 27  --   ALT 34 27  --   ALKPHOS 75 60  --   BILITOT 0.5 0.3  --   PROT 8.1 6.5  --   ALBUMIN 2.0* 1.7* 1.8*   CBC:  Recent Labs Lab 12/27/12 1804 12/28/12 0428 12/28/12 1757 12/29/12 0658 12/30/12 0525 01/01/13 0800  WBC 25.4* 20.6* 22.5* 21.5* 17.4* 15.4*  NEUTROABS 22.6* 17.8*  --   --   --   --   HGB 8.1* 6.7* 8.8* 7.8* 8.5* 7.9*  HCT  24.3* 20.1* 25.7* 23.0* 25.4* 23.7*  MCV 82.4 83.4 82.4 82.1 84.9 84.3  PLT 533* 449* 445* 423* 462* 460*   Cardiac Enzymes:  Recent Labs Lab 12/27/12 2305  TROPONINI <0.30   CBG:  Recent Labs Lab 12/31/12 2152 01/01/13 0725 01/01/13 1305 01/01/13 1724 01/02/13 0733  GLUCAP 140* 101* 89 134* 144*  Medications:   . acetaminophen  500 mg Oral QID  . carbidopa-levodopa  1 tablet Oral BID  . cinacalcet  30 mg Oral BID WC  . collagenase   Topical Daily  . darbepoetin  100 mcg Intravenous Q Fri-HD  . dipyridamole-aspirin  1 capsule Oral BID  . feeding supplement (NEPRO CARB STEADY)  237 mL Oral BID BM  . feeding supplement  30 mL Oral TID WC  . gabapentin  100 mg Oral Daily  . insulin aspart  0-5 Units Subcutaneous QHS  . insulin aspart  0-9 Units Subcutaneous TID WC  . lanthanum  1,000 mg  Oral TID WC  . multivitamin  1 tablet Oral Daily  . omega-3 acid ethyl esters  1 g Oral BID  . ondansetron (ZOFRAN) IV  4 mg Intravenous Once  . vancomycin  500 mg Oral Q6H

## 2013-01-03 LAB — GLUCOSE, CAPILLARY
Glucose-Capillary: 126 mg/dL — ABNORMAL HIGH (ref 70–99)
Glucose-Capillary: 109 mg/dL — ABNORMAL HIGH (ref 70–99)

## 2013-01-03 LAB — CULTURE, BLOOD (ROUTINE X 2): Culture: NO GROWTH

## 2013-01-03 NOTE — Progress Notes (Signed)
Patient made the comment that he thinks he has dialysis in the morning.  I replied "I thought you stopped dialysis".  The patient said no that he stopped "the thing on his spine".  I said "so you didn't stop dialysis"?  Patient stated no that he is suppose to go tomorrow.  Will inform MD and day shift RN. Steele Berg RN

## 2013-01-03 NOTE — Progress Notes (Signed)
[facility]    Harley Fitzwater ZOX:[id number] DOB: 03/19/1931 DOA: 12/27/2012 PCP: Crawford Givens, MD  Brief narrative: 79 year old male patient with chronic kidney disease on dialysis also prior history of CVA and diabetes. Sent from [facility] skilled nursing facility because of hemoglobin lower than baseline. Patient's sister came with the patient as he was able to give limited history. No apparent other symptomatology prior to arrival such as diarrhea cough or abdominal pain or shortness of breath. He is known to be bed bound and has multiple decubiti. She reports these are well cared for at the nursing facility. In the emergency department he was noted to be borderline hypotensive with a systolic blood pressure 104 respiratory 24 and tachycardic with heart rate of 120 he was afebrile. His potassium was low at 2.9 and leukocytosis of 25,000 fecal occult blood was negative baseline hemoglobin 10 and presentation hemoglobin 8.  Assessment/Plan:    Sepsis  -primarily 2/2 cdiff colitis  -BP better-did receive IVF's briefly -PCT 3.84 with normal lactic acid - resolved      Clostridium difficile colitis - severe type  Probably the source for sepsis.  -on oral Vancomycin and po Flagyl since 5/20.  - patient with copious liquid diarrhea - placed flexiseal 12/30/12 - Slow improvement     Diabetes mellitus with end stage renal disease -cbg well controlled -SSI    HYPERTENSION -BP controlled and NOT on meds pre admit    CKD (chronic kidney disease) stage V requiring chronic dialysis -per nephrology. Patient and family discussed with Dr. Arlean Hopping stopping hemodialysis on May 24 and May 25     Anemia of chronic disease -PRBC's for hgb 8- baseline 10    History of CVA (cerebrovascular accident) -with vascular dementia -patient with rigidity, history of shuffled gait, tremors -  - started empiric Sinemet 100 BID on 12/31/12    Decubitus ulcer of buttock, stage 3/Decubitus ulcer of  heel, stage 2   -The patient received hydrotherapy daily during this admission until May 24.  He was placed on an air mattress  and had daily wound dressing changes  Hypokalemia from severe diarrhea - repleting and resolved DVT prophylaxis: SCDs Code Status: Full code Family Communication:patient  Disposition Plan: SNF  Isolation: Contact isolation for C. difficile-positive status  Consultants: Nephrology  Procedures: None  Antibiotics: Zosyn 5/19 >>> 5/20 Vancomycin 5/19 >>> 5/20 Oral vancomycin 5/20 >>> IV Flagyl 5/20 >>>  HPI/Subjective: Alert Clearly lacks short term memory   Objective: Blood pressure 145/80, pulse 101, temperature 97.9 F (36.6 C), temperature source Oral, resp. rate 18, height 6\' 4"  (1.93 m), weight 82.4 kg (181 lb 10.5 oz), SpO2 97.00%. No intake or output data in the 24 hours ending 01/03/13 1114  Patient Vitals for the past 24 hrs:  BP Temp Temp src Pulse Resp SpO2  01/03/13 0730 145/80 mmHg 97.9 F (36.6 C) Oral 101 18 97 %  01/03/13 0400 149/81 mmHg 97.6 F (36.4 C) Oral 103 18 97 %  01/02/13 2054 125/69 mmHg 98.6 F (37 C) Oral 102 20 99 %  01/02/13 1618 118/57 mmHg 98.3 F (36.8 C) Oral 112 18 94 %     Exam: General: No acute respiratory distress Lungs: Clear to auscultation bilaterally without wheezes or crackles, RA Cardiovascular: Regular rate and rhythm without murmur gallop or rub normal S1 and S2, no peripheral edema or JVD Abdomen: Nontender, nondistended, soft, bowel sounds positive, no rebound, no ascites, no appreciable mass, diarrhea Has deep unstageable wound on buttock  Data Reviewed: Basic Metabolic Panel:  Recent Labs Lab 12/27/12 1836 12/28/12 0450 12/29/12 0658 12/30/12 1156 01/01/13 0830  NA 137 137 135 141 136  K 2.9* 3.3* 2.9* 4.5 4.1  CL 95* 98 97 102 99  CO2 26 26 25 28 25   GLUCOSE 126* 106* 122* 129* 103*  BUN 17 23 37* 30* 64*  CREATININE 3.05* 3.98* 5.49* 3.93* 6.36*  CALCIUM 9.2 9.4 9.5 9.5  9.8  PHOS  --   --  3.5  --  3.3   Liver Function Tests:  Recent Labs Lab 12/27/12 1836 12/28/12 0450 01/01/13 0830  AST 30 27  --   ALT 34 27  --   ALKPHOS 75 60  --   BILITOT 0.5 0.3  --   PROT 8.1 6.5  --   ALBUMIN 2.0* 1.7* 1.8*   No results found for this basename: LIPASE, AMYLASE,  in the last 168 hours No results found for this basename: AMMONIA,  in the last 168 hours CBC:  Recent Labs Lab 12/27/12 1804 12/28/12 0428 12/28/12 1757 12/29/12 0658 12/30/12 0525 01/01/13 0800  WBC 25.4* 20.6* 22.5* 21.5* 17.4* 15.4*  NEUTROABS 22.6* 17.8*  --   --   --   --   HGB 8.1* 6.7* 8.8* 7.8* 8.5* 7.9*  HCT 24.3* 20.1* 25.7* 23.0* 25.4* 23.7*  MCV 82.4 83.4 82.4 82.1 84.9 84.3  PLT 533* 449* 445* 423* 462* 460*   Cardiac Enzymes:  Recent Labs Lab 12/27/12 2305  TROPONINI <0.30   BNP (last 3 results) No results found for this basename: PROBNP,  in the last 8760 hours CBG:  Recent Labs Lab 01/02/13 0733 01/02/13 1157 01/02/13 1652 01/02/13 2124 01/03/13 0733  GLUCAP 144* 144* 141* 119* 126*    Recent Results (from the past 240 hour(s))  CULTURE, BLOOD (ROUTINE X 2)     Status: None   Collection Time    12/27/12  8:55 PM      Result Value Range Status   Specimen Description BLOOD HAND RIGHT   Final   Special Requests BOTTLES DRAWN AEROBIC AND ANAEROBIC [facility]   Final   Culture  Setup Time 12/28/2012 01:49   Final   Culture NO GROWTH 5 DAYS   Final   Report Status 01/03/2013 FINAL   Final  CULTURE, BLOOD (ROUTINE X 2)     Status: None   Collection Time    12/27/12  9:00 PM      Result Value Range Status   Specimen Description BLOOD ARM RIGHT   Final   Special Requests BOTTLES DRAWN AEROBIC ONLY 5CC   Final   Culture  Setup Time 12/28/2012 01:51   Final   Culture NO GROWTH 5 DAYS   Final   Report Status 01/03/2013 FINAL   Final  MRSA PCR SCREENING     Status: None   Collection Time    12/27/12 10:50 PM      Result Value Range Status   MRSA by PCR  NEGATIVE  NEGATIVE Final   Comment:            The GeneXpert MRSA Assay (FDA     approved for NASAL specimens     only), is one component of a     comprehensive MRSA colonization     surveillance program. It is not     intended to diagnose MRSA     infection nor to guide or     monitor treatment for     MRSA  infections.  URINE CULTURE     Status: None   Collection Time    12/28/12 12:05 AM      Result Value Range Status   Specimen Description URINE, CATHETERIZED   Final   Special Requests NONE   Final   Culture  Setup Time 12/28/2012 04:58   Final   Colony Count NO GROWTH   Final   Culture NO GROWTH   Final   Report Status 12/29/2012 FINAL   Final  CLOSTRIDIUM DIFFICILE BY PCR     Status: Abnormal   Collection Time    12/28/12  1:26 AM      Result Value Range Status   C difficile by pcr POSITIVE (*) NEGATIVE Final   Comment: CRITICAL RESULT CALLED TO, READ BACK BY AND VERIFIED WITH:     J RESUELLO,RN 12/28/12 0850 BY K SCHULTZ     Studies:  Recent x-ray studies have been reviewed in detail by the Attending Physician  Scheduled Meds:  . acetaminophen  500 mg Oral QID  . carbidopa-levodopa  1 tablet Oral BID  . dipyridamole-aspirin  1 capsule Oral BID  . feeding supplement (NEPRO CARB STEADY)  237 mL Oral BID BM  . gabapentin  100 mg Oral Daily  . insulin aspart  0-5 Units Subcutaneous QHS  . insulin aspart  0-9 Units Subcutaneous TID WC  . ondansetron (ZOFRAN) IV  4 mg Intravenous Once  . vancomycin  500 mg Oral Q6H      Uriah Philipson,MD [phone] 01/03/2013, 11:14 AM

## 2013-01-03 NOTE — Progress Notes (Signed)
[facility] Progress Note  Subjective:   Denies ever having pain. Tells PT that he's ready to "do some running". Asking when he is going to dialysis and does not recall saying he wanted to "stop everything"  Objective Filed Vitals:   01/02/13 1618 01/02/13 2054 01/03/13 0400 01/03/13 0730  BP: 118/57 125/69 149/81 145/80  Pulse: 112 102 103 101  Temp: 98.3 F (36.8 C) 98.6 F (37 C) 97.6 F (36.4 C) 97.9 F (36.6 C)  TempSrc: Oral Oral Oral Oral  Resp: 18 20 18 18   Height:      Weight:      SpO2: 94% 99% 97% 97%   Physical Exam General: Pleasantly confused. Oriented to person and place but not time. Poor recall of recent events this admission. Heart: RRR Lungs: Mostly clear. Poor effort. No appreciable wheezes, rales or rhonchi Abdomen: Soft, NT, non-distended, normal BS Back: Decubitus ulcer - dressed/not examined Extremities: SCD's, no pretibial edema, boot on Rt foot, Rt great toe with darkened tip, long nails Dialysis Access: LFA AVF +bruit  [facility]: [facility] 3hr 45 min EDW 83.5 kg 2K/2CA [place] LFA AVF BFR 500/ DFR A1.5 Epo 15000u, Heparin 5000 u. Venofer 100 mg IV/HD through 12/29/12   Assessment/Plan: 1. EOL issues - Per Dr. Malena Catholic note, lengthy discussion was had with patient on 5/24 to clarify his decision to stop HD; explained to him that his time would be short +/- a week - it depends; feels like he has lived a good life and is the longest living "[place]" at age 80. A family meeting was held with all of family members the plan was for hospice care, with residential hospice being most likely avenue as there are no family members that can provide home care.  Plans were in place to stop all non-comfort meds and initiate hospice. Today however,Mr. Szczepanik is asking when he will go to HD and denies ever saying that he wanted to stop. He is partially oriented but with very poor ST memory making this a tenuous situation with respect to stopping HD. Have  discussed with Dr Lavera Guise and because he is oriented to place and person and no family is present, we will dialyze later today pending further discussions with family regarding stopping tx.  2. Sepsis -  Afebrile, hypotension resolved. Per primary, likely secondary to C. Diff colitis. On po Vanc/contact precautions 3. Decubitus ulcer - Hydrotherapy d/c'd per #1 above. Defer to primary 4. ESRD - last dialysis Saturday 5/24. Orders for today pending discussion with family. Will likely not go until later this evening based on current HD patient load. Hopeful that clarification from family can be obtained by then.  5. Anemia - Hgb 7.9. Aranesp 100 d/c'd per #1 above. Last dose was Friday. Hold off on transfusion for now. Does not seem to be symptomatic. Can resume ESA in time for next scheduled dose if indicated. 6. Secondary hyperparathyroidism - Ca 9.8( 11.6 Corrected) Fosrenol 1g TIDAC and Sensipar 30 BID d/c'd per #1 above. Will resume if plan changes and HD is to be continued. 7. HTN/Volume: SBPs 110's-140s. No BP meds. Under EDW. UF goal 2L or lowest post wgt of ~81kg 8. Nutrition - Alb 1.8. Has been liberalized to regular diet, Nepro. No changes for now.  9. Disp - per #1 above, renal team will continue to follow pending further discussion with family on how they would like to handle pt's wavering decision to stop HD in the setting of ST memory  impairment.  Scot Jun. Broadus John, PA-C [facility] Pager [phone] 01/03/2013,11:50 AM  LOS: 7 days  I have seen and examined this patient. See below. Rodel Glaspy B,MD 01/03/2013 3:32 PM   Additional Objective Labs: Basic Metabolic Panel:  Recent Labs Lab 12/29/12 0658 12/30/12 1156 01/01/13 0830  NA 135 141 136  K 2.9* 4.5 4.1  CL 97 102 99  CO2 25 28 25   GLUCOSE 122* 129* 103*  BUN 37* 30* 64*  CREATININE 5.49* 3.93* 6.36*  CALCIUM 9.5 9.5 9.8  PHOS 3.5  --  3.3   Liver Function Tests:  Recent Labs Lab 12/27/12 1836  12/28/12 0450 01/01/13 0830  AST 30 27  --   ALT 34 27  --   ALKPHOS 75 60  --   BILITOT 0.5 0.3  --   PROT 8.1 6.5  --   ALBUMIN 2.0* 1.7* 1.8*   No results found for this basename: LIPASE, AMYLASE,  in the last 168 hours CBC:  Recent Labs Lab 12/27/12 1804 12/28/12 0428 12/28/12 1757 12/29/12 0658 12/30/12 0525 01/01/13 0800  WBC 25.4* 20.6* 22.5* 21.5* 17.4* 15.4*  NEUTROABS 22.6* 17.8*  --   --   --   --   HGB 8.1* 6.7* 8.8* 7.8* 8.5* 7.9*  HCT 24.3* 20.1* 25.7* 23.0* 25.4* 23.7*  MCV 82.4 83.4 82.4 82.1 84.9 84.3  PLT 533* 449* 445* 423* 462* 460*   Blood Culture    Component Value Date/Time   SDES URINE, CATHETERIZED 12/28/2012 0005   SPECREQUEST NONE 12/28/2012 0005   CULT NO GROWTH 12/28/2012 0005   REPTSTATUS 12/29/2012 FINAL 12/28/2012 0005    Cardiac Enzymes:  Recent Labs Lab 12/27/12 2305  TROPONINI <0.30   CBG:  Recent Labs Lab 01/02/13 0733 01/02/13 1157 01/02/13 1652 01/02/13 2124 01/03/13 0733  GLUCAP 144* 144* 141* 119* 126*    Studies/Results: No results found. Medications:   . acetaminophen  500 mg Oral QID  . carbidopa-levodopa  1 tablet Oral BID  . dipyridamole-aspirin  1 capsule Oral BID  . feeding supplement (NEPRO CARB STEADY)  237 mL Oral BID BM  . gabapentin  100 mg Oral Daily  . insulin aspart  0-5 Units Subcutaneous QHS  . insulin aspart  0-9 Units Subcutaneous TID WC  . ondansetron (ZOFRAN) IV  4 mg Intravenous Once  . vancomycin  500 mg Oral Q6H   Patient is awake, alert, not oriented to time, knows place.  States he has no recall of discussions with Dr. Arlean Hopping about stopping HD but does recall discussions about stopping hydrotherapy ("that thing they were doing to my tail").  Says to me " I shouldn't stop dialysis  - I can't do that".  Continuation of dialysis is not the issue so much as the "logistics" of continuing dialysis in this pt who has a large sacral decub, who will be required to sit in a recliner for 4 hours  3X/week with an hour on either end for transportation - making the likelihood that he could tolerate dialysis further in the outpatient setting - and is NOT considered a candidate for an LTAC.  It is impossible for him to understand the big picture.  I believe Palliative Care might be of assistance in helping sort through these difficult quality of life issues. In the meantime we will assess his tolerance for dialysis in a recliner for the next few treatments. /cbd

## 2013-01-03 NOTE — Progress Notes (Signed)
Physical Therapy Treatment Patient Details Name: [name] MRN: [id number] DOB: 01/31/1931 Today's Date: 01/03/2013 Time: 4098-1191 PT Time Calculation (min): 24 min  PT Assessment / Plan / Recommendation Comments on Treatment Session  patient agreeable to participate in therapy.  Definitely pushing against assistance in sitting.  Having difficulty getting patient to lean forward thus unable to attempt standing today.  Patient will need SNF at d/c.    Follow Up Recommendations  SNF     Does the patient have the potential to tolerate intense rehabilitation     Barriers to Discharge        Equipment Recommendations  None recommended by PT    Recommendations for Other Services    Frequency Min 2X/week   Plan Discharge plan remains appropriate;Frequency remains appropriate    Precautions / Restrictions Precautions Precautions: Fall   Pertinent Vitals/Pain Patient c/o pain when moving left leg, unable to rate    Mobility  Bed Mobility Bed Mobility: Rolling Left;Supine to Sit;Sit to Supine Rolling Left: 1: +2 Total assist Rolling Left: Patient Percentage: 30% Supine to Sit: 1: +2 Total assist Supine to Sit: Patient Percentage: 20% Sitting - Scoot to Edge of Bed: 1: +2 Total assist Sitting - Scoot to Edge of Bed: Patient Percentage: 30% Sit to Supine: 1: +2 Total assist Sit to Supine: Patient Percentage: 10% Details for Bed Mobility Assistance: very resistant to moving; pushes against assistance    Exercises General Exercises - Lower Extremity Long Arc Quad: AROM;Strengthening;Both;5 reps;Seated   PT Diagnosis:    PT Problem List:   PT Treatment Interventions:     PT Goals Acute Rehab PT Goals PT Goal: Rolling Supine to Left Side - Progress: Progressing toward goal PT Goal: Supine/Side to Sit - Progress: Progressing toward goal  Visit Information  Last PT Received On: 01/03/13 Assistance Needed: +2    Subjective Data      Cognition   Cognition Arousal/Alertness: Awake/alert Behavior During Therapy: WFL for tasks assessed/performed Overall Cognitive Status: Impaired/Different from baseline Area of Impairment: Following commands;Problem solving Following Commands: Follows one step commands with increased time Problem Solving: Slow processing;Decreased initiation    Balance  Balance Balance Assessed: Yes Static Sitting Balance Static Sitting - Balance Support: Feet supported;Bilateral upper extremity supported Static Sitting - Level of Assistance: 2: Max assist Static Sitting - Comment/# of Minutes: max assist progressing to min-guard by end of session  End of Session PT - End of Session Activity Tolerance: Patient limited by fatigue Patient left: in bed;with call bell/phone within reach;with family/visitor present   GP     Olivia Canter, [place] [phone] 01/03/2013, 1:25 PM

## 2013-01-03 NOTE — Progress Notes (Signed)
Patient is awake, alert, not oriented to time, knows place.  States he has no recall of discussions with Dr. Arlean Hopping about stopping HD but does recall discussions about stopping hydrotherapy ("that thing they were doing to my tail").  Says to me " I shouldn't stop dialysis  - I can't do that".  Continuation of dialysis is not the issue so much as the "logistics" of continuing dialysis in this pt who has a large sacral decub, who will be required to sit in a recliner for 4 hours 3X/week with an hour on either end for transportation - making the likelihood that he could tolerate dialysis further in the outpatient setting - and is NOT considered a candidate for an LTAC.  It is impossible for him to understand the big picture.  I believe Palliative Care might be of assistance in helping sort through these difficult quality of life issues. In the meantime we will assess his tolerance for dialysis in a recliner for the next few treatments. /cbd

## 2013-01-04 LAB — GLUCOSE, CAPILLARY: Glucose-Capillary: 124 mg/dL — ABNORMAL HIGH (ref 70–99)

## 2013-01-04 NOTE — Progress Notes (Addendum)
UPDATE:  Call back received from patient's son [name] -meeting scheduled to allow patient's daughter, who works until mid-afternoon to participate - [name] will also bring his mother patient's wife - meeting scheduled for tomorrow, Wednesday 5/28 @ 4:00 pm  Valente David, RN [phone]    Palliative Medicine Team consult for goals of care discussion received; patient seen at bedside- alert, in recliner, denies discomfort on visit, stated he expected his family to be in to see him sometime today -no family currently present; attempted to contact son/POA Jones Bales h: [phone] and c: [phone]- voice messages left, await call back to schedule meeting time.  Valente David, RN 01/04/2013, 11:50 AM Palliative Medicine Team RN Liaison [phone]

## 2013-01-04 NOTE — Procedures (Signed)
I have personally attended this patient's dialysis session.  He has done amazingly will sitting in the recliner - sitting on Hoyer pad, 112/55  1656 off so far.  Using left arm access.  BFR 400.  If he is consistently able to sit without complaints of pain, etc then continuation of outpt dialysis becomes more feasible.  He is off schedule due to staffing issues and until there is a discharge plan will plan HD again on Thurs, and get back onto schedule once clear what long term plans will be.  No labs were done today - will check pre-HD next treatment. /cbd    [name] B

## 2013-01-04 NOTE — Progress Notes (Signed)
[facility]    Landin Tallon ZOX:[id number] DOB: 11-Oct-1930 DOA: 12/27/2012 PCP: Crawford Givens, MD  Brief narrative: 79 year old male patient with chronic kidney disease on dialysis also prior history of CVA and diabetes. Sent from [facility] skilled nursing facility because of hemoglobin lower than baseline. Patient's sister came with the patient as he was able to give limited history. No apparent other symptomatology prior to arrival such as diarrhea cough or abdominal pain or shortness of breath. He is known to be bed bound and has multiple decubiti. She reports these are well cared for at the nursing facility. In the emergency department he was noted to be borderline hypotensive with a systolic blood pressure 104 respiratory 24 and tachycardic with heart rate of 120 he was afebrile. His potassium was low at 2.9 and leukocytosis of 25,000 fecal occult blood was negative baseline hemoglobin 10 and presentation hemoglobin 8.  Assessment/Plan:    Sepsis  -primarily 2/2 cdiff colitis  -BP better-did receive IVF's briefly -PCT 3.84 with normal lactic acid - resolved      Clostridium difficile colitis - severe type  Probably the source for sepsis.  -on oral Vancomycin and po Flagyl since 5/20.  - patient with copious liquid diarrhea - placed flexiseal 12/30/12 - Slow improvement     Diabetes mellitus with end stage renal disease -cbg well controlled -SSI    HYPERTENSION -BP controlled and NOT on meds pre admit    CKD (chronic kidney disease) stage V requiring chronic dialysis -per nephrology. Patient and family discussed with Dr. Arlean Hopping stopping hemodialysis on May 24 and May 25. Patient started back on HD on 5/27.      Anemia of chronic disease -PRBC's for hgb 8- baseline 10    History of CVA (cerebrovascular accident) -with vascular dementia -patient with rigidity, history of shuffled gait, tremors -  - started empiric Sinemet 100 BID on 12/31/12    Decubitus ulcer of  buttock, stage 3/Decubitus ulcer of heel, stage 2   -The patient received hydrotherapy daily during this admission until May 24 (he refused to have it after that date due to pain ) .  He was placed on an air mattress  and had daily wound dressing changes  Hypokalemia from severe diarrhea - repleting and resolved DVT prophylaxis: SCDs Code Status: Full code Family Communication: son [name]   Disposition Plan: SNF  Isolation: Contact isolation for C. difficile-positive status  Consultants: Nephrology Palliative care   Procedures: None  Antibiotics: Zosyn 5/19 >>> 5/20 Vancomycin 5/19 >>> 5/20 Oral vancomycin 5/20 >>> IV Flagyl 5/20 >>>  HPI/Subjective: Alert Clearly lacks short term memory   Objective: Blood pressure 112/61, pulse 94, temperature 97.5 F (36.4 C), temperature source Oral, resp. rate 18, height 6\' 4"  (1.93 m), weight 82.4 kg (181 lb 10.5 oz), SpO2 96.00%.  Intake/Output Summary (Last 24 hours) at 01/04/13 0907 Last data filed at 01/03/13 1700  Gross per 24 hour  Intake    360 ml  Output      4 ml  Net    356 ml    Patient Vitals for the past 24 hrs:  BP Temp Temp src Pulse Resp SpO2 Weight  01/04/13 0830 112/61 mmHg - - 94 - - -  01/04/13 0800 120/69 mmHg - - 94 - - -  01/04/13 0730 119/73 mmHg - - 94 - - -  01/04/13 0700 130/74 mmHg - - 93 - - -  01/04/13 0650 147/84 mmHg 97.5 F (36.4 C) Oral 96 18 96 % -  01/04/13 0601 147/82 mmHg 98.5 F (36.9 C) Axillary 94 16 96 % -  01/03/13 2041 135/70 mmHg 98.7 F (37.1 C) Oral 98 18 98 % -  01/03/13 1700 141/77 mmHg 98.3 F (36.8 C) Oral 96 18 98 % -  01/03/13 1330 139/84 mmHg 97.2 F (36.2 C) Oral 96 18 97 % -     Exam: General: No acute respiratory distress, he thinks he is in a outpatient HD unit  Lungs: Clear to auscultation bilaterally without wheezes or crackles, RA Cardiovascular: Regular rate and rhythm without murmur gallop or rub normal S1 and S2, no peripheral edema or JVD Abdomen:  Nontender, nondistended, soft, bowel sounds positive, no rebound, no ascites, no appreciable mass, diarrhea Has deep unstageable wound on buttock   Data Reviewed: Basic Metabolic Panel:  Recent Labs Lab 12/29/12 0658 12/30/12 1156 01/01/13 0830  NA 135 141 136  K 2.9* 4.5 4.1  CL 97 102 99  CO2 25 28 25   GLUCOSE 122* 129* 103*  BUN 37* 30* 64*  CREATININE 5.49* 3.93* 6.36*  CALCIUM 9.5 9.5 9.8  PHOS 3.5  --  3.3   Liver Function Tests:  Recent Labs Lab 01/01/13 0830  ALBUMIN 1.8*   No results found for this basename: LIPASE, AMYLASE,  in the last 168 hours No results found for this basename: AMMONIA,  in the last 168 hours CBC:  Recent Labs Lab 12/28/12 1757 12/29/12 0658 12/30/12 0525 01/01/13 0800  WBC 22.5* 21.5* 17.4* 15.4*  HGB 8.8* 7.8* 8.5* 7.9*  HCT 25.7* 23.0* 25.4* 23.7*  MCV 82.4 82.1 84.9 84.3  PLT 445* 423* 462* 460*   Cardiac Enzymes: No results found for this basename: CKTOTAL, CKMB, CKMBINDEX, TROPONINI,  in the last 168 hours BNP (last 3 results) No results found for this basename: PROBNP,  in the last 8760 hours CBG:  Recent Labs Lab 01/02/13 2124 01/03/13 0733 01/03/13 1221 01/03/13 1715 01/03/13 2115  GLUCAP 119* 126* 120* 109* 117*    Recent Results (from the past 240 hour(s))  CULTURE, BLOOD (ROUTINE X 2)     Status: None   Collection Time    12/27/12  8:55 PM      Result Value Range Status   Specimen Description BLOOD HAND RIGHT   Final   Special Requests BOTTLES DRAWN AEROBIC AND ANAEROBIC [facility]   Final   Culture  Setup Time 12/28/2012 01:49   Final   Culture NO GROWTH 5 DAYS   Final   Report Status 01/03/2013 FINAL   Final  CULTURE, BLOOD (ROUTINE X 2)     Status: None   Collection Time    12/27/12  9:00 PM      Result Value Range Status   Specimen Description BLOOD ARM RIGHT   Final   Special Requests BOTTLES DRAWN AEROBIC ONLY 5CC   Final   Culture  Setup Time 12/28/2012 01:51   Final   Culture NO GROWTH 5 DAYS    Final   Report Status 01/03/2013 FINAL   Final  MRSA PCR SCREENING     Status: None   Collection Time    12/27/12 10:50 PM      Result Value Range Status   MRSA by PCR NEGATIVE  NEGATIVE Final   Comment:            The GeneXpert MRSA Assay (FDA     approved for NASAL specimens     only), is one component of a     comprehensive MRSA  colonization     surveillance program. It is not     intended to diagnose MRSA     infection nor to guide or     monitor treatment for     MRSA infections.  URINE CULTURE     Status: None   Collection Time    12/28/12 12:05 AM      Result Value Range Status   Specimen Description URINE, CATHETERIZED   Final   Special Requests NONE   Final   Culture  Setup Time 12/28/2012 04:58   Final   Colony Count NO GROWTH   Final   Culture NO GROWTH   Final   Report Status 12/29/2012 FINAL   Final  CLOSTRIDIUM DIFFICILE BY PCR     Status: Abnormal   Collection Time    12/28/12  1:26 AM      Result Value Range Status   C difficile by pcr POSITIVE (*) NEGATIVE Final   Comment: CRITICAL RESULT CALLED TO, READ BACK BY AND VERIFIED WITH:     J RESUELLO,RN 12/28/12 0850 BY K SCHULTZ     Studies:  Recent x-ray studies have been reviewed in detail by the Attending Physician  Scheduled Meds:  . acetaminophen  500 mg Oral QID  . carbidopa-levodopa  1 tablet Oral BID  . dipyridamole-aspirin  1 capsule Oral BID  . feeding supplement (NEPRO CARB STEADY)  237 mL Oral BID BM  . gabapentin  100 mg Oral Daily  . insulin aspart  0-5 Units Subcutaneous QHS  . insulin aspart  0-9 Units Subcutaneous TID WC  . ondansetron (ZOFRAN) IV  4 mg Intravenous Once  . vancomycin  500 mg Oral Q6H      Delaine Canter,MD [phone] 01/04/2013, 9:07 AM

## 2013-01-04 NOTE — Consult Note (Signed)
WOC re-consult: Refer to initial WOC consult note for assessment and plan of care on 5/20.  Pt had large amt pain with hydrotherapy session and it has been discontinued according to the progress notes.  Sacrum wound is unstageable and pt could benefit from continued use of Santyl for chemical debridement.  If comfort care goals are desired, then topical treatment would be directed towards a dressing change that is less uncomfortable or frequently changed. Please provide additional goals of care after discussion with family and appropriate topical treatment can be re-ordered at that time. Cammie Mcgee MSN, RN, CWOCN, [place], CNS [phone]

## 2013-01-05 DIAGNOSIS — M549 Dorsalgia, unspecified: Secondary | ICD-10-CM

## 2013-01-05 DIAGNOSIS — Z515 Encounter for palliative care: Secondary | ICD-10-CM | POA: Insufficient documentation

## 2013-01-05 LAB — GLUCOSE, CAPILLARY
Glucose-Capillary: 135 mg/dL — ABNORMAL HIGH (ref 70–99)
Glucose-Capillary: 128 mg/dL — ABNORMAL HIGH (ref 70–99)

## 2013-01-05 MED ORDER — DARBEPOETIN ALFA-POLYSORBATE 100 MCG/0.5ML IJ SOLN
100.0000 ug | INTRAMUSCULAR | Status: DC
  Administered 2013-01-06: 100 ug via INTRAVENOUS
  Filled 2013-01-05: qty 0.5

## 2013-01-05 MED ORDER — RENA-VITE PO TABS
1.0000 | ORAL_TABLET | Freq: Every day | ORAL | Status: DC
  Administered 2013-01-05 – 2013-01-09 (×5): 1 via ORAL
  Filled 2013-01-05 (×6): qty 1

## 2013-01-05 MED ORDER — COLLAGENASE 250 UNIT/GM EX OINT
TOPICAL_OINTMENT | Freq: Every day | CUTANEOUS | Status: DC
  Administered 2013-01-05 – 2013-01-10 (×6): via TOPICAL
  Filled 2013-01-05: qty 30

## 2013-01-05 NOTE — Progress Notes (Signed)
NUTRITION FOLLOW UP  DOCUMENTATION CODES  Per approved criteria   -Severe malnutrition in the context of chronic illness    Intervention:   1. Continue Nepro Shakes and Prostat to promote wound healing; oral intake of meals improved. 2. Recommend Rena-Vite - discussed with Claud Kelp, PA 3. RD to continue to follow nutrition care plan  Nutrition Dx:   Increased nutrient needs related to advanced stage wounds as evidenced by estimated needs. Ongoing.  Goal:   Intake to meet >90% of estimated nutrition needs. Improved.  Monitor:   weight trends, lab trends, I/O's, PO intake, supplement tolerance  Assessment:   PMHx significant for ESRD on HD MWF, OA, CVA, HTN, dementia. From [facility]; pt bedbound at baseline. MD at HD sent pt to ED 2/2 low Hgb.  Pt told renal MD that he is ready to "stop everything" and is "ready to go." Hydrotherapy has been put on hold accordingly. However two days later, pt states that he doesn't remember saying these things. Hydrotherapy remains on hold as it causes pt severe pain. Plan is for GOC meeting today at 4 pm.  Liberalized to a Regular diet and fluid restriction has been added back to diet by Renal team this morning. Continues with orders for Nepro Shakes po BID. Pt will take these supplements and sometimes even request them - confirmed with RN. Meal intake variable, but mostly >50%.  Pt meets criteria for severe MALNUTRITION in the context of chronic illness as evidenced by 17% wt loss x 4 months and intake of <75% x at least 1 month.   Height: Ht Readings from Last 1 Encounters:  12/28/12 6\' 4"  (1.93 m)    Weight Status:   Wt Readings from Last 1 Encounters:  01/04/13 175 lb 4.3 oz (79.5 kg)  Continues to trend down with HD.  Re-estimated needs:  Kcal: 2100 - 2400 Protein: 120 - 135 g Fluid: 1.2 liters  Skin:  DTI to R heel  Unstageable to sacrum  Diet Order: General ; 1200 ml fluid restriction   Intake/Output Summary (Last 24  hours) at 01/05/13 1012 Last data filed at 01/05/13 0700  Gross per 24 hour  Intake    720 ml  Output   2001 ml  Net  -1281 ml    Last BM: 5/26   Labs:   Recent Labs Lab 12/30/12 1156 01/01/13 0830  NA 141 136  K 4.5 4.1  CL 102 99  CO2 28 25  BUN 30* 64*  CREATININE 3.93* 6.36*  CALCIUM 9.5 9.8  PHOS  --  3.3  GLUCOSE 129* 103*    CBG (last 3)   Recent Labs  01/04/13 1633 01/04/13 2151 01/05/13 0724  GLUCAP 124* 127* 92    Scheduled Meds: . acetaminophen  500 mg Oral QID  . carbidopa-levodopa  1 tablet Oral BID  . [START ON 01/06/2013] darbepoetin (ARANESP) injection - DIALYSIS  100 mcg Intravenous Q Thu-HD  . dipyridamole-aspirin  1 capsule Oral BID  . feeding supplement (NEPRO CARB STEADY)  237 mL Oral BID BM  . gabapentin  100 mg Oral Daily  . insulin aspart  0-5 Units Subcutaneous QHS  . insulin aspart  0-9 Units Subcutaneous TID WC  . ondansetron (ZOFRAN) IV  4 mg Intravenous Once  . vancomycin  500 mg Oral Q6H    Continuous Infusions:  none  Jarold Motto MS, RD, LDN Pager: [phone] After-hours pager: [phone]

## 2013-01-05 NOTE — Consult Note (Signed)
Patient [name]      DOB: Apr 06, 1931      ION:[id number]     Consult Note from the Palliative Medicine Team at [facility]    Consult Requested by: Dr. Lendell Caprice     PCP: Patrick Givens, MD Reason for Consultation: GOC     Phone [phone] Related symptom recommendations Assessment of patients Current state: 79 yr old african [place] male with history of vascular dementia, gait disturbance and CKD stage 5, CVA.  Patient was admitted with low hemoglobin, found to have elevated WBC count, anemia and tachycardia.  Patient was diagnosed with sepsis felt to be related ultimately to C diff infection.  He is currently being treated with Vancomycin and Flagyl.  His course has been complicated by a large sacral decubitus and a right heel wound.  I met with the patient's wife [name]), son [name]) and daughter [name]) family is struggling with the idea that he is declining.  At this time they all agree with what is happening to him that he should be a DNR as he would not want be resuscitated to his current state although in the past he told them he would want to be on machines for at least three days then they could take him off.  He does not have capacity for choice at this time and his family understands that he is in a different place with his physical health, making that request not logical.  They do desire to try to continue treatment of his infection, wound and continue dialysis for now.  If he is not going to be able to tolerate dialysis they would be accepting of comfort care, but are not at that point yet.   Goals of Care: 1.  Code Status: DNR/DNI  .  This patient is NOT comfort care at this time.   2. Scope of Treatment: Family desires to continue treatment for infection and general medical illness with dialysis.  If he were to decline and develop worsening signs of sepsis, I would contact them immediately as the really do have a grasp of palliative care for this patient and likely  would switch course to comfort care.  We need to provide updated info to help them work through this.  For now they understand he is tolerating dialysis but know that they might get a call stating he is not able to tolerate that.  We did take about how hospice could be helpful to them down the road when their goals are more focused on comfort care.  They were able to see that DNR status was much better alternative for him at this stage in his life.   4. Disposition: They would prefer not to go back to [place].  I updated the SW on this.  I would request Palliative Care services to follow for pain control and continued GOC if he returns to SNF.  If he takes a turn, the family and I did talk about residential Hospice options, but they are not at that place yet.   3. Symptom Management:   1. Anxiety/Agitation: patient is generally pleasant and able to be redirected in his scattered thoughts.  Family is good at interpreting his thought process 2. Pain: as needed oxyir appears to be working for now in conjunction with air mattress overlay.  Will titrate as needed. 3. Bowel Regimen: Cdiff colitis flexiseal in place 4. Delirium: baseline vascular dementia.  Monitor and treat if needed. 5. Fever: Prn tylenol , treat infection  4.  Psychosocial: [name] worked at [employer] and at one time played semipro baseball.  His family recognizes his fiercely independent, and on the go spirit.    5. Spiritual:  Family does rely on their faith for strength.  Will ask chaplains to visit        Patient Documents Completed or Given: Document Given Completed  Advanced Directives Pkt    [name]    DNR    Gone from My Sight    Hard Choices      Brief HPI: 79 yr old admitted from [facility] with low hemoglobin.  Found to have diarrhea and sepsis from Cdiff.  Course complicated by sacral wound with pain.  Asked to assist with GOC.   ROS: patient with tangential thought secondary to dementia.  At this time , he  states no pain.      PMH:  Past Medical History  Diagnosis Date  . Hypertension   . Diabetes mellitus     typeII / No meds  . Cataract 01/2002    Right  . Dialysis patient 10/13/2007  . Radiculopathy 05/14/2006    NCV study negative carpal tunnel, Left C5 radiculopathy  . Arthritis     Knee pain  . ESRD (end stage renal disease) on dialysis 2009    [facility] diaylsis Monday , Wed. and Friday per Dr. Kathrene Bongo  . Stroke   . Elevated PSA     h/o, pt had declined further eval.   . Syncope 11/02/2012     PSH: Past Surgical History  Procedure Laterality Date  . Varicose vein surgery  1978  . External fixation wrist fracture  1990  . Thyroidectomy, partial  08/03/2003    Right thyroid lobectomy, subtotal parathyr. sec hyperparthyroidismadenosis  . Av fistula placement  07/2004    Left arm AV fistula placement [name] via [place])  . Eye surgery      Cataract   I have reviewed the FH and SH and  If appropriate update it with new information. No Known Allergies Scheduled Meds: . acetaminophen  500 mg Oral QID  . carbidopa-levodopa  1 tablet Oral BID  . collagenase   Topical Daily  . [START ON 01/06/2013] darbepoetin (ARANESP) injection - DIALYSIS  100 mcg Intravenous Q Thu-HD  . dipyridamole-aspirin  1 capsule Oral BID  . feeding supplement (NEPRO CARB STEADY)  237 mL Oral BID BM  . gabapentin  100 mg Oral Daily  . insulin aspart  0-5 Units Subcutaneous QHS  . insulin aspart  0-9 Units Subcutaneous TID WC  . multivitamin  1 tablet Oral QHS  . ondansetron (ZOFRAN) IV  4 mg Intravenous Once  . vancomycin  500 mg Oral Q6H   Continuous Infusions:  PRN Meds:.morphine injection, oxyCODONE, sodium chloride    BP 113/64  Pulse 57  Temp(Src) 98.2 F (36.8 C) (Oral)  Resp 16  Ht 6\' 4"  (1.93 m)  Wt 79.5 kg (175 lb 4.3 oz)  BMI 21.34 kg/m2  SpO2 90%   PPS: 30 %   Intake/Output Summary (Last 24 hours) at 01/05/13 1743 Last data filed at 01/05/13 1300   Gross per 24 hour  Intake    720 ml  Output      0 ml  Net    720 ml   Flexiseal with diarrhea  Physical Exam:  General: no distress, speech clear but tangential  HEENT:  PERRL, EOMI, anicteric,  mmm Chest:   Decreased but clear, no rhonchi, rales  , or wheezs CVS:  regular, distant, S1, S2  Abdomen:soft, not tender or distended, positive bowel sounds Ext: right foot in boot, toe with healing wound, heel not observed Neuro:oriented to himself and his families names, talks about whatever comes to mind.  Thinks he is in [place] ( he loves to eat at [employer])  Labs: CBC    Component Value Date/Time   WBC 15.4* 01/01/2013 0800   RBC 2.81* 01/01/2013 0800   HGB 7.9* 01/01/2013 0800   HCT 23.7* 01/01/2013 0800   PLT 460* 01/01/2013 0800   MCV 84.3 01/01/2013 0800   MCH 28.1 01/01/2013 0800   MCHC 33.3 01/01/2013 0800   RDW 15.3 01/01/2013 0800   LYMPHSABS 1.2 12/28/2012 0428   MONOABS 1.4* 12/28/2012 0428   EOSABS 0.2 12/28/2012 0428   BASOSABS 0.1 12/28/2012 0428      CMP     Component Value Date/Time   NA 136 01/01/2013 0830   K 4.1 01/01/2013 0830   CL 99 01/01/2013 0830   CO2 25 01/01/2013 0830   GLUCOSE 103* 01/01/2013 0830   BUN 64* 01/01/2013 0830   CREATININE 6.36* 01/01/2013 0830   CALCIUM 9.8 01/01/2013 0830   CALCIUM 9.9 09/24/2007 1113   PROT 6.5 12/28/2012 0450   ALBUMIN 1.8* 01/01/2013 0830   AST 27 12/28/2012 0450   ALT 27 12/28/2012 0450   ALKPHOS 60 12/28/2012 0450   BILITOT 0.3 12/28/2012 0450   GFRNONAA 7* 01/01/2013 0830   GFRAA 9* 01/01/2013 0830    Chest Xray Reviewed/Impressions: chronic changes with no acute findings.   Time In Time Out Total Time Spent with Patient Total Overall Time  400 pm 530 pm 20 min 90 min   Will complete a [name] form before discharge. Greater than 50%  of this time was spent counseling and coordinating care related to the above assessment and plan.  Discussed with Dr. Lendell Caprice.    Alexandrea Westergard L. Ladona Ridgel, MD MBA The Palliative Medicine  Team at [facility] Phone: [phone] Pager: [phone]

## 2013-01-05 NOTE — Progress Notes (Signed)
[facility] Progress Note  Subjective:   Sitting up in bed. Feels "pretty good". Wants me to "get on to"  his children for not visiting.  Did amazingly well with HD yesterday sitting in the recliner, with no complaints of pain and was very pleasant throughout the 4 hour treatment.  Objective Filed Vitals:   01/04/13 1330 01/04/13 1752 01/04/13 2150 01/05/13 0545  BP: 102/50 121/67 123/67 127/76  Pulse: 101 86 97 105  Temp: 96.9 F (36.1 C) 98 F (36.7 C) 98 F (36.7 C) 97.9 F (36.6 C)  TempSrc: Oral Oral Oral Oral  Resp:   20 18  Height:      Weight:   79.5 kg (175 lb 4.3 oz)   SpO2: 97% 97% 98% 99%   Physical Exam General: Pleasant, NAD ("Wednesday", "1975", "Obama" Here for "that thing on my tail") Heart: RRR Lungs: CTA bilaterally. Poor effort. No wheezes or rales noted. Abdomen: Soft, NT, non-distended, normal BS Extremities: SCD's. Trace pedal edema on left, Rt foot with boot. No pretibial edema Dialysis Access: LFA AVF +bruit  [facility]patient HD: [facility] 3hr 45 min EDW 83.5 kg 2K/2CA Bath LFA AVF BFR 500/ DFR A1.5 Epo 15000u, Heparin 5000 u. Venofer 100 mg IV/HD through 12/29/12   Assessment/Plan: 1. EOL issues -  Pt has no recall of discussions with Dr. Arlean Hopping and his family regarding stopping dialysis. (See [place]'s note on 5/24) He now says that he should not and does not want to stop dialysis.  Given his poor short term memory and partial orientation, it raises the question of who should decide whether he continues HD and whether or not it is even logistically possible given his large unstagable sacral decub and the requirements of op dialysis.  On 5/27, we conducted a trial to see if he could tolerate sitting in a recliner for dialysis for 4 hours.  He had no issues and was pleasant through the entire session.  Palliative care has arranged for a family meeting at 4:00 pm today to help clarify goals of care. Appreciate their input. 2. Sepsis - Afebrile,  hypotension resolved. Per primary, likely secondary to C. Diff colitis. On po Vanc/contact precautions 3. Decubitus ulcer - Hydrotherapy d/c'd per #1 above and severe pain during last session. WOC was consulted and has provided several options for management once goals of care are clarified. 4. ESRD - Tolerated full session of HD in recliner on 5/27.  Will plan for HD tomorrow pending goals of care meeting. Can resume regular schedule next week if indicated.  5. Anemia - Hgb 7.9. Aranesp 100 d/c'd per #1 above. Will resume for next tx. Hold off on transfusion for now. Asymptomatic. Repeat CBC.  6. Secondary hyperparathyroidism - Ca 9.8( 11.6 Corrected) Fosrenol 1g TIDAC and Sensipar 30 BID d/c'd per #1 above. Will resume if HD is to be continued. Low ca bath. Renal panel pending 7. HTN/Volume: SBPs 120s. No BP meds. Under EDW. UF goal 2L or lowest post wgt of ~81kg 8. Nutrition - Alb 1.8. Has been liberalized to regular diet, Nepro. Add fluid restriction 9. Disp - Goals of care meeting pending  Scot Jun. Thad Ranger [facility] Pager [phone] 01/05/2013,9:28 AM  LOS: 9 days  I have seen and examined this patient and agree with plan as outlined.  Remains pleasant and did remarkably well sitting in recliner for HD yesterday.  Palliative care meeting for goals of care planned today.  We will continue to HD in the  recliner for duration of hospitalization as long as continued tolerance. He is off schedule right now but will get back on outpt schedule once d/c plans imminent./cbd Rennee Coyne B,MD 01/05/2013 10:58 AM   Additional Objective Labs: Basic Metabolic Panel:  Recent Labs Lab 12/30/12 1156 01/01/13 0830  NA 141 136  K 4.5 4.1  CL 102 99  CO2 28 25  GLUCOSE 129* 103*  BUN 30* 64*  CREATININE 3.93* 6.36*  CALCIUM 9.5 9.8  PHOS  --  3.3   Liver Function Tests:  Recent Labs Lab 01/01/13 0830  ALBUMIN 1.8*   No results found for this basename: LIPASE, AMYLASE,   in the last 168 hours CBC:  Recent Labs Lab 12/30/12 0525 01/01/13 0800  WBC 17.4* 15.4*  HGB 8.5* 7.9*  HCT 25.4* 23.7*  MCV 84.9 84.3  PLT 462* 460*   Blood Culture    Component Value Date/Time   SDES URINE, CATHETERIZED 12/28/2012 0005   SPECREQUEST NONE 12/28/2012 0005   CULT NO GROWTH 12/28/2012 0005   REPTSTATUS 12/29/2012 FINAL 12/28/2012 0005    Cardiac Enzymes: No results found for this basename: CKTOTAL, CKMB, CKMBINDEX, TROPONINI,  in the last 168 hours CBG:  Recent Labs Lab 01/03/13 2115 01/04/13 1128 01/04/13 1633 01/04/13 2151 01/05/13 0724  GLUCAP 117* 98 124* 127* 92   Iron Studies: No results found for this basename: IRON, TIBC, TRANSFERRIN, FERRITIN,  in the last 72 hours @lablastinr3 @ Studies/Results: No results found. Medications:   . acetaminophen  500 mg Oral QID  . carbidopa-levodopa  1 tablet Oral BID  . dipyridamole-aspirin  1 capsule Oral BID  . feeding supplement (NEPRO CARB STEADY)  237 mL Oral BID BM  . gabapentin  100 mg Oral Daily  . insulin aspart  0-5 Units Subcutaneous QHS  . insulin aspart  0-9 Units Subcutaneous TID WC  . ondansetron (ZOFRAN) IV  4 mg Intravenous Once  . vancomycin  500 mg Oral Q6H

## 2013-01-05 NOTE — Progress Notes (Signed)
Physical Therapy Treatment Patient Details Name: [name] MRN: [id number] DOB: 09/29/1930 Today's Date: 01/05/2013 Time: 4098-1191 PT Time Calculation (min): 29 min  PT Assessment / Plan / Recommendation Comments on Treatment Session  Pt participating but not making progress with mobility.      Follow Up Recommendations  SNF     Does the patient have the potential to tolerate intense rehabilitation     Barriers to Discharge        Equipment Recommendations  None recommended by PT    Recommendations for Other Services    Frequency Min 2X/week   Plan Discharge plan remains appropriate;Frequency remains appropriate    Precautions / Restrictions Precautions Precautions: Fall Restrictions Weight Bearing Restrictions: No   Pertinent Vitals/Pain No c/o pain.      Mobility  Bed Mobility Bed Mobility: Rolling Left;Left Sidelying to Sit;Sit to Sidelying Left Rolling Left: 1: +2 Total assist Left Sidelying to Sit: 1: +2 Total assist Sit to Sidelying Left: 1: +2 Total assist;HOB flat Transfers Transfers: Sit to Stand;Stand to Sit Sit to Stand: 1: +2 Total assist;With upper extremity assist;From bed Sit to Stand: Patient Percentage: 10% Stand to Sit: 1: +2 Total assist;With upper extremity assist;To bed Stand to Sit: Patient Percentage: 10% Details for Transfer Assistance: Unable to achieve standing position on 7 attempts from  elevated EOB.   Ambulation/Gait Ambulation/Gait Assistance: Not tested (comment)    Exercises     PT Diagnosis:    PT Problem List:   PT Treatment Interventions:     PT Goals Acute Rehab PT Goals PT Goal Formulation: With patient Time For Goal Achievement: 01/13/13 Potential to Achieve Goals: Good Pt will Roll Supine to Right Side: with mod assist PT Goal: Rolling Supine to Right Side - Progress: Not progressing Pt will Roll Supine to Left Side: with min assist PT Goal: Rolling Supine to Left Side - Progress: Not progressing Pt will go  Supine/Side to Sit: with mod assist PT Goal: Supine/Side to Sit - Progress: Not progressing Pt will go Sit to Stand: with max assist PT Goal: Sit to Stand - Progress: Not progressing Pt will Transfer Bed to Chair/Chair to Bed: with max assist PT Transfer Goal: Bed to Chair/Chair to Bed - Progress: Not progressing  Visit Information  Last PT Received On: 01/05/13    Subjective Data      Cognition  Cognition Arousal/Alertness: Awake/alert Behavior During Therapy: WFL for tasks assessed/performed Overall Cognitive Status: Impaired/Different from baseline    Balance  Balance Balance Assessed: Yes Static Sitting Balance Static Sitting - Balance Support: Feet supported;Bilateral upper extremity supported Static Sitting - Level of Assistance: 2: Max assist Static Sitting - Comment/# of Minutes: Several minutes on EOB working on dynamic sitting balance.   End of Session PT - End of Session Equipment Utilized During Treatment: Gait belt Activity Tolerance: Patient limited by fatigue Patient left: in bed;with call bell/phone within reach;with family/visitor present Nurse Communication: Mobility status   GP     [name] 01/05/2013, 7:18 PM [name] L. Chilton Si, DPT   Pager [phone]     Cell [phone]

## 2013-01-05 NOTE — Progress Notes (Signed)
Chart reviewed.  [facility]    Cosby Proby YNW:[id number] DOB: 09-Dec-1930 DOA: 12/27/2012 PCP: Crawford Givens, MD  Brief narrative: 79 year old male patient with chronic kidney disease on dialysis also prior history of CVA and diabetes. Sent from [place] skilled nursing facility because of hemoglobin lower than baseline. Patient's sister came with the patient as he was able to give limited history. No apparent other symptomatology prior to arrival such as diarrhea cough or abdominal pain or shortness of breath. He is known to be bed bound and has multiple decubiti. She reports these are well cared for at the nursing facility. In the emergency department he was noted to be borderline hypotensive with a systolic blood pressure 104 respiratory 24 and tachycardic with heart rate of 120 he was afebrile. His potassium was low at 2.9 and leukocytosis of 25,000 fecal occult blood was negative baseline hemoglobin 10 and presentation hemoglobin 8.  Assessment/Plan:  Awaiting family decision regarding goals of care. No family is currently present. Continue current for now.    Sepsis  -primarily 2/2 cdiff colitis  -BP better-did receive IVF's briefly -PCT 3.84 with normal lactic acid - resolved      Clostridium difficile colitis - severe type  Probably the source for sepsis.  -on oral Vancomycin and po Flagyl since 5/20.  - patient with copious liquid diarrhea - placed flexiseal 12/30/12 - Slow improvement     Diabetes mellitus with end stage renal disease -cbg well controlled -SSI    HYPERTENSION -BP controlled and NOT on meds pre admit    CKD (chronic kidney disease) stage V requiring chronic dialysis -per nephrology. Patient and family discussed with Dr. Arlean Hopping stopping hemodialysis on May 24 and May 25. Patient started back on HD on 5/27.      Anemia of chronic disease -PRBC's for hgb 8- baseline 10    History of CVA (cerebrovascular accident) -with vascular  dementia -patient with rigidity, history of shuffled gait, tremors -  - started empiric Sinemet 100 BID on 12/31/12    Decubitus ulcer of buttock, stage 3/Decubitus ulcer of heel, stage 2   -The patient received hydrotherapy daily during this admission until May 24 (he refused to have it after that date due to pain ) .  He was placed on an air mattress  and had daily wound dressing changes  Hypokalemia from severe diarrhea - repleting and resolved DVT prophylaxis: SCDs Code Status: Full code Family Communication: son [name]   Disposition Plan: SNF  Isolation: Contact isolation for C. difficile-positive status  Consultants: Nephrology Palliative care   Procedures: None  Antibiotics: Zosyn 5/19 >>> 5/20 Vancomycin 5/19 >>> 5/20 Oral vancomycin 5/20 >>> IV Flagyl 5/20 >>>  HPI/Subjective: Patient denies abdominal pain, shortness of breath, discomfort. Reports having taken some health shake today.  Objective: Blood pressure 113/64, pulse 57, temperature 98.2 F (36.8 C), temperature source Oral, resp. rate 16, height 6\' 4"  (1.93 m), weight 79.5 kg (175 lb 4.3 oz), SpO2 90.00%.  Intake/Output Summary (Last 24 hours) at 01/05/13 1649 Last data filed at 01/05/13 1300  Gross per 24 hour  Intake    720 ml  Output      0 ml  Net    720 ml    Patient Vitals for the past 24 hrs:  BP Temp Temp src Pulse Resp SpO2 Weight  01/05/13 1315 113/64 mmHg 98.2 F (36.8 C) Oral 57 16 90 % -  01/05/13 0915 137/67 mmHg 97.9 F (36.6 C) Oral 102 20 99 % -  01/05/13 0545 127/76 mmHg 97.9 F (36.6 C) Oral 105 18 99 % -  01/04/13 2150 123/67 mmHg 98 F (36.7 C) Oral 97 20 98 % 79.5 kg (175 lb 4.3 oz)  01/04/13 1752 121/67 mmHg 98 F (36.7 C) Oral 86 - 97 % -     Exam: General: Comfortable. Cooperative. Lungs: Clear to auscultation bilaterally without wheezes or crackles, RA Cardiovascular: Regular rate and rhythm without murmur gallop or rub normal S1 and S2, no peripheral edema or  JVD Abdomen: Nontender, nondistended, soft, bowel sounds positive, no rebound, no ascites, no appreciable mass, diarrhea Extremities: Sequential compression devices and a foam boot on the right foot   Data Reviewed: Basic Metabolic Panel:  Recent Labs Lab 12/30/12 1156 01/01/13 0830  NA 141 136  K 4.5 4.1  CL 102 99  CO2 28 25  GLUCOSE 129* 103*  BUN 30* 64*  CREATININE 3.93* 6.36*  CALCIUM 9.5 9.8  PHOS  --  3.3   Liver Function Tests:  Recent Labs Lab 01/01/13 0830  ALBUMIN 1.8*   No results found for this basename: LIPASE, AMYLASE,  in the last 168 hours No results found for this basename: AMMONIA,  in the last 168 hours CBC:  Recent Labs Lab 12/30/12 0525 01/01/13 0800  WBC 17.4* 15.4*  HGB 8.5* 7.9*  HCT 25.4* 23.7*  MCV 84.9 84.3  PLT 462* 460*   Cardiac Enzymes: No results found for this basename: CKTOTAL, CKMB, CKMBINDEX, TROPONINI,  in the last 168 hours BNP (last 3 results) No results found for this basename: PROBNP,  in the last 8760 hours CBG:  Recent Labs Lab 01/04/13 1128 01/04/13 1633 01/04/13 2151 01/05/13 0724 01/05/13 1114  GLUCAP 98 124* 127* 92 135*    Recent Results (from the past 240 hour(s))  CULTURE, BLOOD (ROUTINE X 2)     Status: None   Collection Time    12/27/12  8:55 PM      Result Value Range Status   Specimen Description BLOOD HAND RIGHT   Final   Special Requests BOTTLES DRAWN AEROBIC AND ANAEROBIC [facility]   Final   Culture  Setup Time 12/28/2012 01:49   Final   Culture NO GROWTH 5 DAYS   Final   Report Status 01/03/2013 FINAL   Final  CULTURE, BLOOD (ROUTINE X 2)     Status: None   Collection Time    12/27/12  9:00 PM      Result Value Range Status   Specimen Description BLOOD ARM RIGHT   Final   Special Requests BOTTLES DRAWN AEROBIC ONLY 5CC   Final   Culture  Setup Time 12/28/2012 01:51   Final   Culture NO GROWTH 5 DAYS   Final   Report Status 01/03/2013 FINAL   Final  MRSA PCR SCREENING     Status: None    Collection Time    12/27/12 10:50 PM      Result Value Range Status   MRSA by PCR NEGATIVE  NEGATIVE Final   Comment:            The GeneXpert MRSA Assay (FDA     approved for NASAL specimens     only), is one component of a     comprehensive MRSA colonization     surveillance program. It is not     intended to diagnose MRSA     infection nor to guide or     monitor treatment for     MRSA infections.  URINE CULTURE     Status: None   Collection Time    12/28/12 12:05 AM      Result Value Range Status   Specimen Description URINE, CATHETERIZED   Final   Special Requests NONE   Final   Culture  Setup Time 12/28/2012 04:58   Final   Colony Count NO GROWTH   Final   Culture NO GROWTH   Final   Report Status 12/29/2012 FINAL   Final  CLOSTRIDIUM DIFFICILE BY PCR     Status: Abnormal   Collection Time    12/28/12  1:26 AM      Result Value Range Status   C difficile by pcr POSITIVE (*) NEGATIVE Final   Comment: CRITICAL RESULT CALLED TO, READ BACK BY AND VERIFIED WITH:     J RESUELLO,RN 12/28/12 0850 BY K SCHULTZ     Studies:  Recent x-ray studies have been reviewed in detail by the Attending Physician  Scheduled Meds:  . acetaminophen  500 mg Oral QID  . carbidopa-levodopa  1 tablet Oral BID  . collagenase   Topical Daily  . [START ON 01/06/2013] darbepoetin (ARANESP) injection - DIALYSIS  100 mcg Intravenous Q Thu-HD  . dipyridamole-aspirin  1 capsule Oral BID  . feeding supplement (NEPRO CARB STEADY)  237 mL Oral BID BM  . gabapentin  100 mg Oral Daily  . insulin aspart  0-5 Units Subcutaneous QHS  . insulin aspart  0-9 Units Subcutaneous TID WC  . multivitamin  1 tablet Oral QHS  . ondansetron (ZOFRAN) IV  4 mg Intravenous Once  . vancomycin  500 mg Oral Q6H    Lakelynn Severtson L,MD [phone] 01/05/2013, 4:49 PM

## 2013-01-06 LAB — CBC
HCT: 26.7 % — ABNORMAL LOW (ref 39.0–52.0)
Hemoglobin: 8.7 g/dL — ABNORMAL LOW (ref 13.0–17.0)
MCHC: 32.6 g/dL (ref 30.0–36.0)
MCV: 85 fL (ref 78.0–100.0)
RDW: 16.2 % — ABNORMAL HIGH (ref 11.5–15.5)

## 2013-01-06 LAB — RENAL FUNCTION PANEL
BUN: 35 mg/dL — ABNORMAL HIGH (ref 6–23)
Calcium: 10.3 mg/dL (ref 8.4–10.5)
Glucose, Bld: 102 mg/dL — ABNORMAL HIGH (ref 70–99)
Phosphorus: 5.5 mg/dL — ABNORMAL HIGH (ref 2.3–4.6)

## 2013-01-06 LAB — GLUCOSE, CAPILLARY: Glucose-Capillary: 111 mg/dL — ABNORMAL HIGH (ref 70–99)

## 2013-01-06 MED ORDER — DARBEPOETIN ALFA-POLYSORBATE 100 MCG/0.5ML IJ SOLN
INTRAMUSCULAR | Status: AC
  Filled 2013-01-06: qty 0.5

## 2013-01-06 NOTE — Procedures (Addendum)
I have personally attended this patient's dialysis session.  He is dialyzing in the recliner without any significant discomfort.  On 2K2Ca bath with K 3.9 -->change to 4K.   No pre weight done in HD [name]/c pt in recliner (prev EDW 83.5 kg/yesterday's bed weight 80)) AVF with BFR 500 BP 110/57 Lungs clear, right foot in boot, left LE with chronic changes, minimal edema K as above Hb 8.7 (incr 7.9) on Darbe 100 QThursday  Tolerating well thus far.   OF NOTE IS NOW DNR/DNI    [name]

## 2013-01-06 NOTE — Progress Notes (Signed)
Chart reviewed.  [facility]    [name] WUJ:[id number] DOB: 1931/03/27 DOA: 12/27/2012 PCP: Patrick Givens, MD  Brief narrative: 79 year old male patient with chronic kidney disease on dialysis also prior history of CVA and diabetes. Sent from [facility] skilled nursing facility because of hemoglobin lower than baseline. Patient's sister came with the patient as he was able to give limited history. No apparent other symptomatology prior to arrival such as diarrhea cough or abdominal pain or shortness of breath. He is known to be bed bound and has multiple decubiti. She reports these are well cared for at the nursing facility. In the emergency department he was noted to be borderline hypotensive with a systolic blood pressure 104 respiratory 24 and tachycardic with heart rate of 120 he was afebrile. His potassium was low at 2.9 and leukocytosis of 25,000 fecal occult blood was negative baseline hemoglobin 10 and presentation hemoglobin 8.  Assessment/Plan:  Now DNR.  Monitor for a few days.  Examine wound with next dressing change.  To SNF (or hospice if patient declines)    Sepsis  -primarily 2/2 cdiff colitis  -BP better-did receive IVF's briefly -PCT 3.84 with normal lactic acid - resolved      Clostridium difficile colitis - severe type  Probably the source for sepsis.  -on oral Vancomycin and po Flagyl since 5/20.  - patient with copious liquid diarrhea - placed flexiseal 12/30/12 - Slow improvement     Diabetes mellitus with end stage renal disease -cbg well controlled -SSI    HYPERTENSION -BP controlled and NOT on meds pre admit    CKD (chronic kidney disease) stage V requiring chronic dialysis -per nephrology. Patient and family discussed with Dr. Arlean Hopping stopping hemodialysis on May 24 and May 25. Patient started back on HD on 5/27.      Anemia of chronic disease -PRBC's for hgb 8- baseline 10    History of CVA (cerebrovascular accident) -with vascular  dementia -patient with rigidity, history of shuffled gait, tremors -  - started empiric Sinemet 100 BID on 12/31/12    Decubitus ulcer of buttock, stage 3/Decubitus ulcer of heel, stage 2   -The patient received hydrotherapy daily during this admission until May 24 (he refused to have it after that date due to pain ) .  He was placed on an air mattress  and had daily wound dressing changes  Hypokalemia from severe diarrhea - repleting and resolved DVT prophylaxis: SCDs Code Status: Full code Family Communication: son [name]   Disposition Plan: SNF  Isolation: Contact isolation for C. difficile-positive status  Consultants: Nephrology Palliative care   Procedures: None  Antibiotics: Zosyn 5/19 >>> 5/20 Vancomycin 5/19 >>> 5/20 Oral vancomycin 5/20 >>> IV Flagyl 5/20 >>>  HPI/Subjective: Patient denies abdominal pain, shortness of breath, discomfort. Reports having taken some health shake today.  Objective: Blood pressure 128/72, pulse 100, temperature 98.3 F (36.8 C), temperature source Oral, resp. rate 19, height 6\' 4"  (1.93 m), weight 80 kg (176 lb 5.9 oz), SpO2 98.00%.  Intake/Output Summary (Last 24 hours) at 01/06/13 1807 Last data filed at 01/06/13 1300  Gross per 24 hour  Intake    360 ml  Output   2000 ml  Net  -1640 ml    Patient Vitals for the past 24 hrs:  BP Temp Temp src Pulse Resp SpO2 Weight  01/06/13 1416 128/72 mmHg 98.3 F (36.8 C) Oral 100 19 98 % -  01/06/13 1124 118/59 mmHg 98.2 F (36.8 C) Oral 101 19  98 % -  01/06/13 1049 104/52 mmHg 98.2 F (36.8 C) Oral 108 18 97 % -  01/06/13 1030 83/46 mmHg - - 112 - - -  01/06/13 1000 80/47 mmHg - - 104 - - -  01/06/13 0930 87/46 mmHg - - 105 - - -  01/06/13 0900 83/52 mmHg - - 104 - - -  01/06/13 0830 93/54 mmHg - - 103 - - -  01/06/13 0800 99/51 mmHg - - 104 - - -  01/06/13 0730 110/57 mmHg - - 103 - - -  01/06/13 0701 128/72 mmHg - - 105 - - -  01/06/13 0647 133/67 mmHg 97.8 F (36.6 C) Oral  105 18 98 % -  01/06/13 0447 99/42 mmHg 97.9 F (36.6 C) Axillary 98 16 97 % -  01/05/13 2130 125/73 mmHg 98.7 F (37.1 C) Oral 73 18 98 % 80 kg (176 lb 5.9 oz)     Exam: General: Comfortable. Cooperative. Lungs: Clear to auscultation bilaterally without wheezes or crackles, RA Cardiovascular: Regular rate and rhythm without murmur gallop or rub normal S1 and S2, no peripheral edema or JVD Abdomen: Nontender, nondistended, soft, bowel sounds positive, no rebound, no ascites, no appreciable mass, diarrhea Extremities: Sequential compression devices and a foam boot on the right foot   Data Reviewed: Basic Metabolic Panel:  Recent Labs Lab 01/01/13 0830 01/06/13 0644  NA 136 135  K 4.1 3.9  CL 99 95*  CO2 25 27  GLUCOSE 103* 102*  BUN 64* 35*  CREATININE 6.36* 5.74*  CALCIUM 9.8 10.3  PHOS 3.3 5.5*   Liver Function Tests:  Recent Labs Lab 01/01/13 0830 01/06/13 0644  ALBUMIN 1.8* 2.3*   No results found for this basename: LIPASE, AMYLASE,  in the last 168 hours No results found for this basename: AMMONIA,  in the last 168 hours CBC:  Recent Labs Lab 01/01/13 0800 01/06/13 0644  WBC 15.4* 12.9*  HGB 7.9* 8.7*  HCT 23.7* 26.7*  MCV 84.3 85.0  PLT 460* 592*   Cardiac Enzymes: No results found for this basename: CKTOTAL, CKMB, CKMBINDEX, TROPONINI,  in the last 168 hours BNP (last 3 results) No results found for this basename: PROBNP,  in the last 8760 hours CBG:  Recent Labs Lab 01/05/13 1114 01/05/13 1733 01/05/13 2144 01/06/13 1121 01/06/13 1722  GLUCAP 135* 128* 123* 85 149*    Recent Results (from the past 240 hour(s))  CULTURE, BLOOD (ROUTINE X 2)     Status: None   Collection Time    12/27/12  8:55 PM      Result Value Range Status   Specimen Description BLOOD HAND RIGHT   Final   Special Requests BOTTLES DRAWN AEROBIC AND ANAEROBIC [facility]   Final   Culture  Setup Time 12/28/2012 01:49   Final   Culture NO GROWTH 5 DAYS   Final   Report  Status 01/03/2013 FINAL   Final  CULTURE, BLOOD (ROUTINE X 2)     Status: None   Collection Time    12/27/12  9:00 PM      Result Value Range Status   Specimen Description BLOOD ARM RIGHT   Final   Special Requests BOTTLES DRAWN AEROBIC ONLY 5CC   Final   Culture  Setup Time 12/28/2012 01:51   Final   Culture NO GROWTH 5 DAYS   Final   Report Status 01/03/2013 FINAL   Final  MRSA PCR SCREENING     Status: None   Collection Time  12/27/12 10:50 PM      Result Value Range Status   MRSA by PCR NEGATIVE  NEGATIVE Final   Comment:            The GeneXpert MRSA Assay (FDA     approved for NASAL specimens     only), is one component of a     comprehensive MRSA colonization     surveillance program. It is not     intended to diagnose MRSA     infection nor to guide or     monitor treatment for     MRSA infections.  URINE CULTURE     Status: None   Collection Time    12/28/12 12:05 AM      Result Value Range Status   Specimen Description URINE, CATHETERIZED   Final   Special Requests NONE   Final   Culture  Setup Time 12/28/2012 04:58   Final   Colony Count NO GROWTH   Final   Culture NO GROWTH   Final   Report Status 12/29/2012 FINAL   Final  CLOSTRIDIUM DIFFICILE BY PCR     Status: Abnormal   Collection Time    12/28/12  1:26 AM      Result Value Range Status   C difficile by pcr POSITIVE (*) NEGATIVE Final   Comment: CRITICAL RESULT CALLED TO, READ BACK BY AND VERIFIED WITH:     J RESUELLO,RN 12/28/12 0850 BY K SCHULTZ     Studies:  Recent x-ray studies have been reviewed in detail by the Attending Physician  Scheduled Meds:  . acetaminophen  500 mg Oral QID  . carbidopa-levodopa  1 tablet Oral BID  . collagenase   Topical Daily  . darbepoetin (ARANESP) injection - DIALYSIS  100 mcg Intravenous Q Thu-HD  . dipyridamole-aspirin  1 capsule Oral BID  . feeding supplement (NEPRO CARB STEADY)  237 mL Oral BID BM  . gabapentin  100 mg Oral Daily  . insulin aspart  0-5  Units Subcutaneous QHS  . insulin aspart  0-9 Units Subcutaneous TID WC  . multivitamin  1 tablet Oral QHS  . ondansetron (ZOFRAN) IV  4 mg Intravenous Once  . vancomycin  500 mg Oral Q6H    Daegen Berrocal L,MD [phone] 01/06/2013, 6:07 PM

## 2013-01-07 LAB — GLUCOSE, CAPILLARY: Glucose-Capillary: 135 mg/dL — ABNORMAL HIGH (ref 70–99)

## 2013-01-07 MED ORDER — SODIUM CHLORIDE 0.9 % IV SOLN: 100.0000 mL | INTRAVENOUS | Status: DC | PRN

## 2013-01-07 MED ORDER — LIDOCAINE HCL (PF) 1 % IJ SOLN: 5.0000 mL | INTRAMUSCULAR | Status: DC | PRN

## 2013-01-07 MED ORDER — CINACALCET HCL 30 MG PO TABS
30.0000 mg | ORAL_TABLET | Freq: Two times a day (BID) | ORAL | Status: DC
  Administered 2013-01-07 – 2013-01-09 (×4): 30 mg via ORAL
  Filled 2013-01-07 (×8): qty 1

## 2013-01-07 MED ORDER — ALTEPLASE 2 MG IJ SOLR: 2.0000 mg | Freq: Once | INTRAMUSCULAR | Status: AC | PRN

## 2013-01-07 MED ORDER — HEPARIN SODIUM (PORCINE) 1000 UNIT/ML DIALYSIS: 20.0000 [IU]/kg | INTRAMUSCULAR | Status: DC | PRN

## 2013-01-07 MED ORDER — LIDOCAINE-PRILOCAINE 2.5-2.5 % EX CREA: 1.0000 "application " | TOPICAL_CREAM | CUTANEOUS | Status: DC | PRN

## 2013-01-07 MED ORDER — NEPRO/CARBSTEADY PO LIQD: 237.0000 mL | ORAL | Status: DC | PRN

## 2013-01-07 MED ORDER — LANTHANUM CARBONATE 500 MG PO CHEW
500.0000 mg | CHEWABLE_TABLET | Freq: Three times a day (TID) | ORAL | Status: DC
  Administered 2013-01-07 – 2013-01-10 (×7): 500 mg via ORAL
  Filled 2013-01-07 (×12): qty 1

## 2013-01-07 MED ORDER — PENTAFLUOROPROP-TETRAFLUOROETH EX AERO: 1.0000 "application " | INHALATION_SPRAY | CUTANEOUS | Status: DC | PRN

## 2013-01-07 MED ORDER — HEPARIN SODIUM (PORCINE) 1000 UNIT/ML DIALYSIS
1000.0000 [IU] | INTRAMUSCULAR | Status: DC | PRN
  Filled 2013-01-07: qty 1

## 2013-01-07 NOTE — Progress Notes (Signed)
[facility] Progress Note  Subjective:    Appears plan may be for d/c to SNF after HD tomorrow Has dialyzed in the recliner X 2 with no issues Remains alert and pleasantly demented Plans are to continue HD for now. He has been made DNR/DNI  Objective Filed Vitals:   01/06/13 1416 01/06/13 1812 01/06/13 2130 01/07/13 0615  BP: 128/72 95/54 125/60 109/61  Pulse: 100 100 119 108  Temp: 98.3 F (36.8 C) 98.4 F (36.9 C) 99.3 F (37.4 C) 98.5 F (36.9 C)  TempSrc: Oral Oral Oral Oral  Resp: 19 19 20 18   Height:      Weight:   76.9 kg (169 lb 8.5 oz)   SpO2: 98% 99% 97% 98%   Physical Exam General: Pleasant NAD Heart:No rub Lungs:Clear Abdomen:Soft without tenderness Extremities: right foot in boot (see wound care description of ulcer)  chronnic changes LLE No sig edema Dialysis Access: left AVF bruit and thrill  [facility] 3hr 45 min EDW 83.5 kg 2K/2CA Bath LFA AVF BFR 500/ DFR A1.5 Epo 15000u, Heparin 5000 u. Venofer 100 mg IV/HD through 12/29/12   Assessment/Plan: 1. EOL issues - Significant change in direction over the past week. Appreciative of Palliative Care Team working with pt and family. Pt does not have capacity for choice at this time. Tolerating HD in recliner simulating outpt dialysis environment. Pt now DNR.  Plan to continue dialysis until patient is unable to tolerate this.  2. Sepsis/CDiff - Afebrile, hypotension resolved. Likely secondary to C. Diff colitis. On po Vanc (I believe to go through 6/2) /contact precautions 3. Decubitus ulcer buttock/heel - Hydrotherapy d/c'd per #1 above and severe pain during last session. WOC consulted; air mattress with dressing changes. 4. ESRD - MWF , but off schedule - last HD Thursday; tolerated full session of HD in recliner on 5/27 and 5/29. If pt to be d/c today, can dialyze several hours to get back on schedule - otherwise, will dialyze Saturday (appears plan is for D/C after HD Saturday - then  he could go to his usual HD on Monday at [facility]). 5. Anemia - Hgb 9.7 5/29. Aranesp 100 q Thursday 6. Secondary hyperparathyroidism -  Low ca bath ( use 2 Ca if K is ighg enough). Resume fosrenol 500 tid ac (prev 1 gm tid) and sensipar 30 bid, since proceeding with HD. 7. HTN/Volume: SBPs 90 - 120s. No BP meds. Significantly Under EDW. UF goal 2L 5/29 - no pre HD weight; post weight was 76.9 8. Nutrition - Alb 1.8. Has been liberalized to regular diet, Nepro. Add fluid restriction 9. Disp - DNR - return to NH/notify HD unit if D/C on Sat; will try to assess EDW post HD tomorrow (unreliable last few weights) and notify unit of his DNR status  Sheffield Slider, PA-C [facility] Beeper [phone] 01/07/2013,8:21 AM  LOS: 11 days  I have seen and examined this patient and agree with plan as outlined in the above note with highlighted additions.  Tentative plan is for D/C to SNF after HD (off schedule) tomorrow then he will resume usual MWF schedule at [facility] after discharge.  Appears will be on po vanco through 6/2.  We will try to get some accurate weights tomorrow with HD as new EDW will likely be lower than on admission. Sarayah Bacchi B,MD 01/07/2013 4:07 PM   Additional Objective Labs: Basic Metabolic Panel:  Recent Labs Lab 01/01/13 0830 01/06/13 0644  NA 136 135  K 4.1 3.9  CL 99 95*  CO2 25 27  GLUCOSE 103* 102*  BUN 64* 35*  CREATININE 6.36* 5.74*  CALCIUM 9.8 10.3  PHOS 3.3 5.5*   Liver Function Tests:  Recent Labs Lab 01/01/13 0830 01/06/13 0644  ALBUMIN 1.8* 2.3*   CBC:  Recent Labs Lab 01/01/13 0800 01/06/13 0644  WBC 15.4* 12.9*  HGB 7.9* 8.7*  HCT 23.7* 26.7*  MCV 84.3 85.0  PLT 460* 592*  CBG:  Recent Labs Lab 01/05/13 2144 01/06/13 1121 01/06/13 1722 01/06/13 2236 01/07/13 0737  GLUCAP 123* 85 149* 111* 98   IrMedications:   . acetaminophen  500 mg Oral QID  . carbidopa-levodopa  1 tablet Oral BID  . collagenase   Topical Daily   . darbepoetin (ARANESP) injection - DIALYSIS  100 mcg Intravenous Q Thu-HD  . dipyridamole-aspirin  1 capsule Oral BID  . feeding supplement (NEPRO CARB STEADY)  237 mL Oral BID BM  . gabapentin  100 mg Oral Daily  . insulin aspart  0-5 Units Subcutaneous QHS  . insulin aspart  0-9 Units Subcutaneous TID WC  . multivitamin  1 tablet Oral QHS  . ondansetron (ZOFRAN) IV  4 mg Intravenous Once  . vancomycin  500 mg Oral Q6H

## 2013-01-07 NOTE — Progress Notes (Signed)
Discussed with Dr. Ladona Ridgel, S.W.  Chart reviewed.  [facility]    Dejour Vos YNW:[id number] DOB: 1931/03/01 DOA: 12/27/2012 PCP: Crawford Givens, MD  Brief narrative: 79 year old male patient with chronic kidney disease on dialysis also prior history of CVA and diabetes. Sent from [facility] skilled nursing facility because of hemoglobin lower than baseline. Patient's sister came with the patient as he was able to give limited history. No apparent other symptomatology prior to arrival such as diarrhea cough or abdominal pain or shortness of breath. He is known to be bed bound and has multiple decubiti. She reports these are well cared for at the nursing facility. In the emergency department he was noted to be borderline hypotensive with a systolic blood pressure 104 respiratory 24 and tachycardic with heart rate of 120 he was afebrile. His potassium was low at 2.9 and leukocytosis of 25,000 fecal occult blood was negative baseline hemoglobin 10 and presentation hemoglobin 8.  Assessment/Plan:  Patient has been stable. Discussed with Dr. Ladona Ridgel. No need to monitor over the weekend. Will discharge to skilled nursing facility after dialysis tomorrow. No family currently available for    Sepsis  -primarily 2/2 cdiff colitis  -BP better-did receive IVF's briefly -PCT 3.84 with normal lactic acid - resolved      Clostridium difficile colitis - severe type  Probably the source for sepsis.  -on oral Vancomycin since 5/20 for 14 days No further diarrhea. Discontinue flexiseal     Diabetes mellitus with end stage renal disease -cbg well controlled -SSI    HYPERTENSION -BP controlled and NOT on meds pre admit    CKD (chronic kidney disease) stage V requiring chronic dialysis -per nephrology. Patient and family discussed with Dr. Arlean Hopping stopping hemodialysis on May 24 and May 25. Patient started back on HD on 5/27.      Anemia of chronic disease -PRBC's for hgb 8- baseline 10    History of CVA (cerebrovascular accident) -with vascular dementia -patient with rigidity, history of shuffled gait, tremors -  - started empiric Sinemet 100 BID on 12/31/12    Decubitus ulcer of buttock, stage 3/Decubitus ulcer of heel, stage 2 Exam month with wound nurse today. Still with a lot of grayish exudate. No odor or signs of infection. Continue Santyl daily dressings.  Protein calorie malnutrition  DVT prophylaxis: SCDs Code Status: DNR Family Communication: son [name]   Disposition Plan: SNF  Isolation: Contact isolation for C. difficile-positive status  Consultants: Nephrology Palliative care   Procedures: None  Antibiotics: Zosyn 5/19 >>> 5/20 Vancomycin 5/19 >>> 5/20 Oral vancomycin 5/20 >>> IV Flagyl 5/20 stopped  HPI/Subjective: Patient denies abdominal pain, shortness of breath, discomfort. Reports having taken some health shake today.  Objective: Blood pressure 104/62, pulse 104, temperature 98.4 F (36.9 C), temperature source Oral, resp. rate 18, height 6\' 4"  (1.93 m), weight 76.9 kg (169 lb 8.5 oz), SpO2 98.00%.  Intake/Output Summary (Last 24 hours) at 01/07/13 1413 Last data filed at 01/07/13 0900  Gross per 24 hour  Intake    240 ml  Output     20 ml  Net    220 ml    Patient Vitals for the past 24 hrs:  BP Temp Temp src Pulse Resp SpO2 Weight  01/07/13 1010 104/62 mmHg 98.4 F (36.9 C) Oral 104 18 98 % -  01/07/13 0615 109/61 mmHg 98.5 F (36.9 C) Oral 108 18 98 % -  01/06/13 2130 125/60 mmHg 99.3 F (37.4 C) Oral 119 20 97 %  76.9 kg (169 lb 8.5 oz)  01/06/13 1812 95/54 mmHg 98.4 F (36.9 C) Oral 100 19 99 % -  01/06/13 1416 128/72 mmHg 98.3 F (36.8 C) Oral 100 19 98 % -     Exam: General: Comfortable. Cooperative. Lungs: Clear to auscultation bilaterally without wheezes or crackles, RA Cardiovascular: Regular rate and rhythm without murmur gallop or rub normal S1 and S2, no peripheral edema or JVD Abdomen: Nontender,  nondistended, soft, bowel sounds positive, no rebound, no ascites, no appreciable mass, diarrhea Extremities: Sequential compression devices and a foam boot on the right foot. Unstageable right heel ulcer skin: Large stage IV sacral decubitus with gray exudate. No odor. No surrounding cellulitis. See wound care note.   Data Reviewed: Basic Metabolic Panel:  Recent Labs Lab 01/01/13 0830 01/06/13 0644  NA 136 135  K 4.1 3.9  CL 99 95*  CO2 25 27  GLUCOSE 103* 102*  BUN 64* 35*  CREATININE 6.36* 5.74*  CALCIUM 9.8 10.3  PHOS 3.3 5.5*   Liver Function Tests:  Recent Labs Lab 01/01/13 0830 01/06/13 0644  ALBUMIN 1.8* 2.3*   No results found for this basename: LIPASE, AMYLASE,  in the last 168 hours No results found for this basename: AMMONIA,  in the last 168 hours CBC:  Recent Labs Lab 01/01/13 0800 01/06/13 0644  WBC 15.4* 12.9*  HGB 7.9* 8.7*  HCT 23.7* 26.7*  MCV 84.3 85.0  PLT 460* 592*   Cardiac Enzymes: No results found for this basename: CKTOTAL, CKMB, CKMBINDEX, TROPONINI,  in the last 168 hours BNP (last 3 results) No results found for this basename: PROBNP,  in the last 8760 hours CBG:  Recent Labs Lab 01/05/13 2144 01/06/13 1121 01/06/13 1722 01/06/13 2236 01/07/13 0737  GLUCAP 123* 85 149* 111* 98    No results found for this or any previous visit (from the past 240 hour(s)).   Studies:  Recent x-ray studies have been reviewed in detail by the Attending Physician  Scheduled Meds:  . acetaminophen  500 mg Oral QID  . carbidopa-levodopa  1 tablet Oral BID  . cinacalcet  30 mg Oral BID WC  . collagenase   Topical Daily  . darbepoetin (ARANESP) injection - DIALYSIS  100 mcg Intravenous Q Thu-HD  . dipyridamole-aspirin  1 capsule Oral BID  . feeding supplement (NEPRO CARB STEADY)  237 mL Oral BID BM  . gabapentin  100 mg Oral Daily  . insulin aspart  0-5 Units Subcutaneous QHS  . insulin aspart  0-9 Units Subcutaneous TID WC  .  lanthanum  500 mg Oral TID WC  . multivitamin  1 tablet Oral QHS  . ondansetron (ZOFRAN) IV  4 mg Intravenous Once  . vancomycin  500 mg Oral Q6H    Tee Richeson L,MD [phone] 01/07/2013, 2:13 PM

## 2013-01-07 NOTE — Progress Notes (Addendum)
Patient ZO:[id number] Bernard      DOB: 1931/02/05      WUJ:[id number]   Palliative Medicine Team at [facility] Progress Note    Subjective: Baldo Ash in the sitting up in bed with a big smile on his face attempting to eat his breakfast. He reports no pain and denies nausea or vomiting. His short-term memory deficits but is pleasantly confused. I reviewed his case with his son who will be looking for long-term care placement for him. Family of goals at this time are to continue dialysis and curative treatment until such time as it is more evident that they should discontinue for comfort purposes.     Filed Vitals:   01/07/13 1010  BP: 104/62  Pulse: 104  Temp: 98.4 F (36.9 C)  Resp: 18   Physical exam:   Generally: No acute distress Pupils are equal round and reactive to light extraocular muscles are intact his membranes are moist neck is supple there is no JVD Chest is decreased but clear to auscultation there is no rhonchi rales or wheezes Cardiovascular surgery rhythm positive S1 and S2 no S3-S4 don't appreciate a murmur or gallop abdomen is soft nontender nondistended with positive bowel sounds no hepatosplenomegaly no guarding or rebound Extremities are warm without mottling Neurological the patient is pleasantly confused but awake and alert     Assessment and plan: Assessment and plan this is an 79 year old African American male admitted to the hospital with increased pain related to sacral decubitus, and low hemoglobin. I've reviewed goals of care with his spouse his daughter and his son they desire to continue curative treatment including dialysis if he is able to tolerate this until such time as it becomes evident that he is suffering. The family is well versed in palliative options and we did discuss hospice care at the time of their choice.   1. DO NOT RESUSCITATE DO NOT INTUBATE 2. Sacral pain related to decubitus. They have communicated with wound and they will be reevaluating  his sacral decubitus for recommendations for discharge treatment. It appears the patient has been doing well with just oral Tylenol. Oxycodone could be used and discharged if something stronger as necessary. In general using appropriate padding and an air mattress overlay we will improve his pain control and limit the amount of opiates needed. 3. Dementia with pleasant agitation continuous supervision and redirection to his necessary 4. tremor: Less noticeable today patient is currently on Sinemet would continue.  Discussed with patient's son [name] Total time 15 minutes  Please request palliative services to follow at discharge to the skilled nursing facility. Kelsey Edman L. Ladona Ridgel, MD MBA The Palliative Medicine Team at [facility] Phone: [phone] Pager: [phone]

## 2013-01-07 NOTE — Consult Note (Addendum)
WOC follow up  Wound type: Stage IV pressure ulcer Pressure Ulcer POA: Yes, was unstageable when this patient arrived for this admission, however he has since had multiple debridements and it is now Stage IV. Has Unstageable heel ulcer POA, in Prevalon boot.  Measurement:10cm x 12cm  c 3.5cm with undermining from 11-2 o'clock.   Wound bed: 50%  Yellow slough, 50% pink tissue Drainage (amount, consistency, odor) moderate, serosanguinous  Periwound: intact Dressing procedure/placement/frequency: continues enzymatic debridement to necrotic tissue, with normal saline moist gauze, cover with ABD and secure with tape. Change daily. Air mattress in place (will need this at dc to SNF)Chair pad for pressure redistribution while he up on HD.  Right heel still hard, stable eschar.   Re consult if needed, will not follow at this time. Thanks  Gaylan Fauver [employer], CWOCN [phone])

## 2013-01-07 NOTE — Progress Notes (Signed)
NUTRITION FOLLOW UP  DOCUMENTATION CODES  Per approved criteria   -Severe malnutrition in the context of chronic illness    Intervention:   1. Continue Nepro Shakes and Regular diet; oral intake of meals improved. 2. RD to continue to follow nutrition care plan  Nutrition Dx:   Increased nutrient needs related to advanced stage wounds as evidenced by estimated needs. Ongoing.  Goal:   Intake to meet >90% of estimated nutrition needs. Improved.  Monitor:   weight trends, lab trends, I/O's, PO intake, supplement tolerance  Assessment:   PMHx significant for ESRD on HD MWF, OA, CVA, HTN, dementia. From [facility]; pt bedbound at baseline. MD at HD sent pt to ED 2/2 low Hgb.  Pt s/p GOC meeting on 5/28 - pt is not comfort care; family would like for pt to continue treatment for infection and HD.  Continues on liberalized Regular diet with fluid restriction. Continues with orders for Nepro Shakes po BID. Pt will take these supplements and sometimes even request them - confirmed with RN. Meal intake variable, but mostly >50%.  Pt meets criteria for severe MALNUTRITION in the context of chronic illness as evidenced by 17% wt loss x 4 months and intake of <75% x at least 1 month.   Height: Ht Readings from Last 1 Encounters:  12/28/12 6\' 4"  (1.93 m)    Weight Status:   Wt Readings from Last 1 Encounters:  01/06/13 169 lb 8.5 oz (76.9 kg)  Continues to trend down with HD.  Re-estimated needs:  Kcal: 2100 - 2400 Protein: 120 - 135 g Fluid: 1.2 liters  Skin:  DTI to R heel  Unstageable to sacrum  Diet Order: General 1200 ml fluid restriction   Intake/Output Summary (Last 24 hours) at 01/07/13 1058 Last data filed at 01/07/13 0900  Gross per 24 hour  Intake    600 ml  Output     20 ml  Net    580 ml    Last BM: 5/29   Labs:   Recent Labs Lab 01/01/13 0830 01/06/13 0644  NA 136 135  K 4.1 3.9  CL 99 95*  CO2 25 27  BUN 64* 35*  CREATININE 6.36* 5.74*   CALCIUM 9.8 10.3  PHOS 3.3 5.5*  GLUCOSE 103* 102*    CBG (last 3)   Recent Labs  01/06/13 1722 01/06/13 2236 01/07/13 0737  GLUCAP 149* 111* 98    Scheduled Meds: . acetaminophen  500 mg Oral QID  . carbidopa-levodopa  1 tablet Oral BID  . cinacalcet  30 mg Oral BID WC  . collagenase   Topical Daily  . darbepoetin (ARANESP) injection - DIALYSIS  100 mcg Intravenous Q Thu-HD  . dipyridamole-aspirin  1 capsule Oral BID  . feeding supplement (NEPRO CARB STEADY)  237 mL Oral BID BM  . gabapentin  100 mg Oral Daily  . insulin aspart  0-5 Units Subcutaneous QHS  . insulin aspart  0-9 Units Subcutaneous TID WC  . lanthanum  500 mg Oral TID WC  . multivitamin  1 tablet Oral QHS  . ondansetron (ZOFRAN) IV  4 mg Intravenous Once  . vancomycin  500 mg Oral Q6H    Continuous Infusions:  none  Jarold Motto MS, RD, LDN Pager: [phone] After-hours pager: [phone]

## 2013-01-08 LAB — RENAL FUNCTION PANEL
Calcium: 9.2 mg/dL (ref 8.4–10.5)
GFR calc Af Amer: 29 mL/min — ABNORMAL LOW (ref >90)
GFR calc non Af Amer: 25 mL/min — ABNORMAL LOW (ref >90)
Glucose, Bld: 119 mg/dL — ABNORMAL HIGH (ref 70–99)
Phosphorus: 2.6 mg/dL (ref 2.3–4.6)
Potassium: 3.2 mEq/L — ABNORMAL LOW (ref 3.5–5.1)
Sodium: 139 mEq/L (ref 135–145)

## 2013-01-08 LAB — CBC
Hemoglobin: 9 g/dL — ABNORMAL LOW (ref 13.0–17.0)
MCHC: 32.5 g/dL (ref 30.0–36.0)
Platelets: 489 10*3/uL — ABNORMAL HIGH (ref 150–400)
RBC: 3.18 MIL/uL — ABNORMAL LOW (ref 4.22–5.81)

## 2013-01-08 LAB — GLUCOSE, CAPILLARY: Glucose-Capillary: 85 mg/dL (ref 70–99)

## 2013-01-08 NOTE — Progress Notes (Signed)
[facility]    Cloy Cozzens ZOX:[id number] DOB: 1930-12-31 DOA: 12/27/2012 PCP: Crawford Givens, MD  Brief narrative: 79 year old male patient with chronic kidney disease on dialysis also prior history of CVA and diabetes. Sent from [facility] facility because of hemoglobin lower than baseline. Patient's sister came with the patient as he was able to give limited history. No apparent other symptomatology prior to arrival such as diarrhea cough or abdominal pain or shortness of breath. He is known to be bed bound and has multiple decubiti. She reports these are well cared for at the nursing facility. In the emergency department he was noted to be borderline hypotensive with a systolic blood pressure 104 respiratory 24 and tachycardic with heart rate of 120 he was afebrile. His potassium was low at 2.9 and leukocytosis of 25,000 fecal occult blood was negative baseline hemoglobin 10 and presentation hemoglobin 8.  Assessment/Plan:  SNF unable to receive patient back this weekend.  Will go monday    Sepsis  -primarily 2/2 cdiff colitis  -BP better-did receive IVF's briefly -PCT 3.84 with normal lactic acid - resolved      Clostridium difficile colitis - severe type  Probably the source for sepsis.  -on oral Vancomycin since 5/20 for 14 days No further diarrhea reported     Diabetes mellitus with end stage renal disease -cbg well controlled -SSI    HYPERTENSION -BP controlled and NOT on meds pre admit    CKD (chronic kidney disease) stage V requiring chronic dialysis -per nephrology. Patient and family discussed with Dr. Arlean Hopping stopping hemodialysis on May 24 and May 25. Patient started back on HD on 5/27.      Anemia of chronic disease -PRBC's for hgb 8- baseline 10    History of CVA (cerebrovascular accident) -with vascular dementia -patient with rigidity, history of shuffled gait, tremors -  - started empiric Sinemet 100 BID on 12/31/12    Decubitus  ulcer of buttock, stage 4/Decubitus ulcer of heel, stage 2   Protein calorie malnutrition  DVT prophylaxis: SCDs Code Status: DNR Family Communication: son [name]   Disposition Plan: SNF  Isolation: Contact isolation for C. difficile-positive status  Consultants: Nephrology Palliative care   Procedures: None  Antibiotics: Zosyn 5/19 >>> 5/20 Vancomycin 5/19 >>> 5/20 Oral vancomycin 5/20 >>> IV Flagyl 5/20 stopped  HPI/Subjective: Patient denies abdominal pain, shortness of breath, discomfort. Reports having taken some health shake today.  Objective: Blood pressure 103/43, pulse 110, temperature 98.6 F (37 C), temperature source Oral, resp. rate 18, height 6\' 4"  (1.93 m), weight 78.2 kg (172 lb 6.4 oz), SpO2 100.00%.  Intake/Output Summary (Last 24 hours) at 01/08/13 1722 Last data filed at 01/08/13 1501  Gross per 24 hour  Intake    600 ml  Output   1500 ml  Net   -900 ml    Patient Vitals for the past 24 hrs:  BP Temp Temp src Pulse Resp SpO2 Height Weight  01/08/13 1631 103/43 mmHg 98.6 F (37 C) - 110 18 100 % - -  01/08/13 1234 118/55 mmHg 98.6 F (37 C) - 105 18 97 % - -  01/08/13 1150 120/68 mmHg 97.4 F (36.3 C) Oral 100 16 - - -  01/08/13 1130 107/69 mmHg - - 101 14 98 % - -  01/08/13 1100 112/63 mmHg - - 104 20 - - -  01/08/13 1030 113/59 mmHg - - 101 18 - - -  01/08/13 1000 116/68 mmHg - - 95 14 - - -  01/08/13 0930 117/61 mmHg - - 99 13 - - -  01/08/13 0900 112/66 mmHg - - 104 14 - - -  01/08/13 0830 117/65 mmHg - - - 16 - - -  01/08/13 0800 121/74 mmHg - - 98 14 - - -  01/08/13 0745 - - - - 15 - - -  01/08/13 0740 120/77 mmHg - - 101 16 - - -  01/08/13 0700 139/76 mmHg 97.8 F (36.6 C) Oral 105 18 94 % - 78.2 kg (172 lb 6.4 oz)  01/08/13 0406 114/62 mmHg 97.9 F (36.6 C) Oral 101 18 100 % - -  01/07/13 2200 103/55 mmHg 98.2 F (36.8 C) Oral 115 18 94 % 6\' 4"  (1.93 m) 77.6 kg (171 lb 1.2 oz)  01/07/13 1841 118/74 mmHg 98.2 F (36.8 C) Oral  98 19 98 % - -     Exam: General: Comfortable. Cooperative. Lungs: Clear to auscultation bilaterally without wheezes or crackles, RA Cardiovascular: Regular rate and rhythm without murmur gallop or rub normal S1 and S2, no peripheral edema or JVD Abdomen: Nontender, nondistended, soft, bowel sounds positive, no rebound, no ascites, no appreciable mass, diarrhea Extremities: Sequential compression devices and a foam boot on the right foot. Unstageable right heel ulcer skin: Large stage IV sacral decubitus with gray exudate. No odor. No surrounding cellulitis. See wound care note.   Data Reviewed: Basic Metabolic Panel:  Recent Labs Lab 01/06/13 0644 01/08/13 1355  NA 135 139  K 3.9 3.2*  CL 95* 98  CO2 27 33*  GLUCOSE 102* 119*  BUN 35* 8  CREATININE 5.74* 2.28*  CALCIUM 10.3 9.2  PHOS 5.5* 2.6   Liver Function Tests:  Recent Labs Lab 01/06/13 0644 01/08/13 1355  ALBUMIN 2.3* 2.4*   No results found for this basename: LIPASE, AMYLASE,  in the last 168 hours No results found for this basename: AMMONIA,  in the last 168 hours CBC:  Recent Labs Lab 01/06/13 0644 01/08/13 1355  WBC 12.9* 12.9*  HGB 8.7* 9.0*  HCT 26.7* 27.7*  MCV 85.0 87.1  PLT 592* 489*   Cardiac Enzymes: No results found for this basename: CKTOTAL, CKMB, CKMBINDEX, TROPONINI,  in the last 168 hours BNP (last 3 results) No results found for this basename: PROBNP,  in the last 8760 hours CBG:  Recent Labs Lab 01/07/13 1452 01/07/13 1640 01/07/13 2148 01/08/13 1235 01/08/13 1632  GLUCAP 144* 129* 135* 85 149*    No results found for this or any previous visit (from the past 240 hour(s)).   Studies:  Recent x-ray studies have been reviewed in detail by the Attending Physician  Scheduled Meds:  . acetaminophen  500 mg Oral QID  . carbidopa-levodopa  1 tablet Oral BID  . cinacalcet  30 mg Oral BID WC  . collagenase   Topical Daily  . darbepoetin (ARANESP) injection - DIALYSIS   100 mcg Intravenous Q Thu-HD  . dipyridamole-aspirin  1 capsule Oral BID  . feeding supplement (NEPRO CARB STEADY)  237 mL Oral BID BM  . gabapentin  100 mg Oral Daily  . insulin aspart  0-5 Units Subcutaneous QHS  . insulin aspart  0-9 Units Subcutaneous TID WC  . lanthanum  500 mg Oral TID WC  . multivitamin  1 tablet Oral QHS  . ondansetron (ZOFRAN) IV  4 mg Intravenous Once  . vancomycin  500 mg Oral Q6H    Froylan Hobby L,MD [phone]  01/08/2013, 5:22 PM

## 2013-01-08 NOTE — Progress Notes (Signed)
[facility] Progress Note  Subjective:    Appears plan may be for d/c to SNF after HD today Has dialyzed in the recliner X 2 with no issues but was not sent to HD in recliner today Remains alert and pleasantly demented Plans are to continue HD for now. He has been made DNR/DNI  Objective Filed Vitals:   01/08/13 0740 01/08/13 0745 01/08/13 0800 01/08/13 0830  BP: 120/77  121/74 117/65  Pulse: 101  98   Temp:      TempSrc:      Resp: 16 15 14 16   Height:      Weight:      SpO2:       Physical Exam Pre HD weight 78.2 (prev EDW 83.5) General: Pleasant NAD On air mattress bed Heart:No rub Lungs:Clear Abdomen:Soft without tenderness Extremities: right foot in boot (see wound care description of ulcer)  chronic changes LLE No sig edema Dialysis Access: left AVF bruit and thrill - accessed for HD at present [facility] 3hr 45 min EDW 83.5 kg 2K/2CA Bath LFA AVF BFR 500/ DFR A1.5 Epo 15000u, Heparin 5000 u. Venofer 100 mg IV/HD through 12/29/12   Recent Labs Lab 01/06/13 0644  NA 135  K 3.9  CL 95*  CO2 27  GLUCOSE 102*  BUN 35*  CREATININE 5.74*  CALCIUM 10.3  PHOS 5.5*   Liver Function Tests:  Recent Labs Lab 01/06/13 0644  ALBUMIN 2.3*   CBC:  Recent Labs Lab 01/06/13 0644  WBC 12.9*  HGB 8.7*  HCT 26.7*  MCV 85.0  PLT 592*  CBG:  Recent Labs Lab 01/07/13 0737 01/07/13 1152 01/07/13 1452 01/07/13 1640 01/07/13 2148  GLUCAP 98 106* 144* 129* 135*   Medications:   . acetaminophen  500 mg Oral QID  . carbidopa-levodopa  1 tablet Oral BID  . cinacalcet  30 mg Oral BID WC  . collagenase   Topical Daily  . darbepoetin (ARANESP) injection - DIALYSIS  100 mcg Intravenous Q Thu-HD  . dipyridamole-aspirin  1 capsule Oral BID  . feeding supplement (NEPRO CARB STEADY)  237 mL Oral BID BM  . gabapentin  100 mg Oral Daily  . insulin aspart  0-5 Units Subcutaneous QHS  . insulin aspart  0-9 Units Subcutaneous TID WC  .  lanthanum  500 mg Oral TID WC  . multivitamin  1 tablet Oral QHS  . ondansetron (ZOFRAN) IV  4 mg Intravenous Once  . vancomycin  500 mg Oral Q6H   Assessment/Plan: 1. EOL issues - Significant change in direction over the past week. Appreciative of Palliative Care Team working with pt and family. Pt does not have capacity for choice at this time. Tolerated HD in recliner X 2 simulating outpt dialysis environment. Pt now DNR.  Plan to continue dialysis until patient is unable to tolerate this.  2. Sepsis/CDiff - Afebrile, hypotension resolved. Likely secondary to C. Diff colitis. On po Vanc (I believe to go through 6/2) /contact precautions 3. Decubitus ulcer buttock/heel - Hydrotherapy d/c'd per #1 above and severe pain during last session. WOC consulted; air mattress with dressing changes. 4. ESRD - MWF , but off schedule.  Tolerated full session of HD in recliner on 5/27 and 5/29. But off schedule.  Dialysis today  And if discharged can go back to his usual MWF schedule at [facility] on Monday Will have new EDW 5. Anemia - Hgb 9.7 5/29. Aranesp 100 q Thursday 6. Secondary hyperparathyroidism -  Low ca bath ( use 2 Ca if K is enough). Resume fosrenol 500 tid ac (prev 1 gm tid) and sensipar 30 bid, since proceeding with HD. 7. HTN/Volume: SBPs 90 - 120s. No BP meds. Significantly Under EDW. UF goal 2L 5/29 - no pre HD weight; post weight was 76.9 8. Nutrition - Alb 1.8. Has been liberalized to regular diet, Nepro. Add fluid restriction 9. Disp - DNR - return to NH/notify HD unit if D/C on Sat; will try to assess EDW post HD (unreliable last few weights) and notify HD unit of his DNR status  Jhade Berko B, MD

## 2013-01-08 NOTE — Procedures (Signed)
I have personally attended this patient's dialysis session.  Access left AVF with BFR 500.  BP 120/77  1.5 liter UF goal.  Pre weight 78.2 kg. Tolerating well thus far.    [name] B

## 2013-01-09 LAB — GLUCOSE, CAPILLARY: Glucose-Capillary: 100 mg/dL — ABNORMAL HIGH (ref 70–99)

## 2013-01-09 NOTE — Progress Notes (Signed)
Patient [name]      DOB: 1931/02/21      NUU:[id number]  Social support visit. Patient eating his lunch .  Son [name] at the bedside.  Plan to transition to SNF on Monday.  Discharge pain medication should be OxyIR as currently ordered.  He has not needed Morphine since 5/23.  See wound care recommendations from 5/30.  Giovanny Dugal L. Ladona Ridgel, MD MBA The Palliative Medicine Team at [facility] Phone: [phone] Pager: [phone]

## 2013-01-09 NOTE — Progress Notes (Signed)
[facility]    [name] ZOX:[id number] DOB: May 12, 1931 DOA: 12/27/2012 PCP: Crawford Givens, MD  Brief narrative: 79 year old male patient with chronic kidney disease on dialysis also prior history of CVA and diabetes. Sent from [facility] skilled nursing facility because of hemoglobin lower than baseline. Patient's sister came with the patient as he was able to give limited history. No apparent other symptomatology prior to arrival such as diarrhea cough or abdominal pain or shortness of breath. He is known to be bed bound and has multiple decubiti. She reports these are well cared for at the nursing facility. In the emergency department he was noted to be borderline hypotensive with a systolic blood pressure 104 respiratory 24 and tachycardic with heart rate of 120 he was afebrile. His potassium was low at 2.9 and leukocytosis of 25,000 fecal occult blood was negative baseline hemoglobin 10 and presentation hemoglobin 8.  Assessment/Plan:  SNF unable to receive patient back this weekend.  Will go monday after dialysis    Sepsis  -primarily 2/2 cdiff colitis  -BP better-did receive IVF's briefly -PCT 3.84 with normal lactic acid - resolved      Clostridium difficile colitis - severe type  Probably the source for sepsis.  -on oral Vancomycin since 5/20 for 14 days No further diarrhea reported     Diabetes mellitus with end stage renal disease -cbg well controlled -SSI    HYPERTENSION -BP controlled and NOT on meds pre admit    CKD (chronic kidney disease) stage V requiring chronic dialysis -per nephrology. Patient and family discussed with Dr. Arlean Hopping stopping hemodialysis on May 24 and May 25. Patient started back on HD on 5/27.      Anemia of chronic disease -PRBC's for hgb 8- baseline 10    History of CVA (cerebrovascular accident) -with vascular dementia -patient with rigidity, history of shuffled gait, tremors -  - started empiric Sinemet 100 BID on  12/31/12    Decubitus ulcer of buttock, stage 4/Decubitus ulcer of heel, stage 2   Protein calorie malnutrition  DVT prophylaxis: SCDs Code Status: DNR Family Communication: son [name]   Disposition Plan: SNF  Isolation: Contact isolation for C. difficile-positive status  Consultants: Nephrology Palliative care   Procedures: None  Antibiotics: Zosyn 5/19 >>> 5/20 Vancomycin 5/19 >>> 5/20 Oral vancomycin 5/20 >>> IV Flagyl 5/20 stopped  HPI/Subjective: Denies pain or other complaints. The nurse reports no significant diarrhea, just soft stool  Objective: Blood pressure 101/56, pulse 108, temperature 98 F (36.7 C), temperature source Axillary, resp. rate 17, height 6\' 4"  (1.93 m), weight 77.338 kg (170 lb 8 oz), SpO2 100.00%.  Intake/Output Summary (Last 24 hours) at 01/09/13 1448 Last data filed at 01/09/13 1320  Gross per 24 hour  Intake    580 ml  Output      0 ml  Net    580 ml    Patient Vitals for the past 24 hrs:  BP Temp Temp src Pulse Resp SpO2 Height Weight  01/09/13 1319 101/56 mmHg 98 F (36.7 C) - 108 17 100 % - -  01/09/13 0949 115/60 mmHg 97.4 F (36.3 C) - 108 18 99 % - -  01/09/13 0539 108/57 mmHg 99 F (37.2 C) Axillary 103 18 97 % - -  01/08/13 2021 90/51 mmHg 98.5 F (36.9 C) Oral 119 18 98 % 6\' 4"  (1.93 m) 77.338 kg (170 lb 8 oz)  01/08/13 1631 103/43 mmHg 98.6 F (37 C) - 110 18 100 % - -  Exam: General: Comfortable. Cooperative. Does not remember me. Lungs: Clear to auscultation bilaterally without WRR Cardiovascular: Regular rate and rhythm without murmur gallop or rub normal S1 and S2, no peripheral edema or JVD Abdomen: Nontender, nondistended, soft, bowel sounds positive, no rebound, no ascites, no appreciable mass, diarrhea Extremities: Sequential compression devices and a foam boot on the right foot. Unstageable right heel ulcer skin: Large stage IV sacral decubitus with gray exudate. No odor. No surrounding cellulitis. See  wound care note.   Data Reviewed: Basic Metabolic Panel:  Recent Labs Lab 01/06/13 0644 01/08/13 1355  NA 135 139  K 3.9 3.2*  CL 95* 98  CO2 27 33*  GLUCOSE 102* 119*  BUN 35* 8  CREATININE 5.74* 2.28*  CALCIUM 10.3 9.2  PHOS 5.5* 2.6   Liver Function Tests:  Recent Labs Lab 01/06/13 0644 01/08/13 1355  ALBUMIN 2.3* 2.4*   No results found for this basename: LIPASE, AMYLASE,  in the last 168 hours No results found for this basename: AMMONIA,  in the last 168 hours CBC:  Recent Labs Lab 01/06/13 0644 01/08/13 1355  WBC 12.9* 12.9*  HGB 8.7* 9.0*  HCT 26.7* 27.7*  MCV 85.0 87.1  PLT 592* 489*   Cardiac Enzymes: No results found for this basename: CKTOTAL, CKMB, CKMBINDEX, TROPONINI,  in the last 168 hours BNP (last 3 results) No results found for this basename: PROBNP,  in the last 8760 hours CBG:  Recent Labs Lab 01/08/13 1235 01/08/13 1632 01/08/13 2043 01/09/13 0732 01/09/13 1130  GLUCAP 85 149* 88 100* 167*    No results found for this or any previous visit (from the past 240 hour(s)).   Studies:  Recent x-ray studies have been reviewed in detail by the Attending Physician  Scheduled Meds:  . acetaminophen  500 mg Oral QID  . carbidopa-levodopa  1 tablet Oral BID  . cinacalcet  30 mg Oral BID WC  . collagenase   Topical Daily  . darbepoetin (ARANESP) injection - DIALYSIS  100 mcg Intravenous Q Thu-HD  . dipyridamole-aspirin  1 capsule Oral BID  . feeding supplement (NEPRO CARB STEADY)  237 mL Oral BID BM  . gabapentin  100 mg Oral Daily  . insulin aspart  0-5 Units Subcutaneous QHS  . insulin aspart  0-9 Units Subcutaneous TID WC  . lanthanum  500 mg Oral TID WC  . multivitamin  1 tablet Oral QHS  . ondansetron (ZOFRAN) IV  4 mg Intravenous Once  . vancomycin  500 mg Oral Q6H    Adrain Nesbit L,MD [phone]  01/09/2013, 2:48 PM

## 2013-01-09 NOTE — Progress Notes (Addendum)
SNF did not accept patient back on a Saturday so he will need HD prior to his discharge to SNF on Monday.  Orders written for first round treatment. Anticipate  will have new EDW of around 77.5 kg.

## 2013-01-10 DIAGNOSIS — E41 Nutritional marasmus: Secondary | ICD-10-CM

## 2013-01-10 DIAGNOSIS — E43 Unspecified severe protein-calorie malnutrition: Secondary | ICD-10-CM | POA: Diagnosis present

## 2013-01-10 LAB — RENAL FUNCTION PANEL
CO2: 30 mEq/L (ref 19–32)
Chloride: 96 mEq/L (ref 96–112)
GFR calc Af Amer: 10 mL/min — ABNORMAL LOW (ref >90)
Glucose, Bld: 99 mg/dL (ref 70–99)
Potassium: 4.1 mEq/L (ref 3.5–5.1)
Sodium: 138 mEq/L (ref 135–145)

## 2013-01-10 LAB — GLUCOSE, CAPILLARY
Glucose-Capillary: 96 mg/dL (ref 70–99)
Glucose-Capillary: 86 mg/dL (ref 70–99)
Glucose-Capillary: 127 mg/dL — ABNORMAL HIGH (ref 70–99)

## 2013-01-10 MED ORDER — NEPRO/CARBSTEADY PO LIQD: 237.0000 mL | ORAL | Status: DC | PRN

## 2013-01-10 MED ORDER — SODIUM CHLORIDE 0.9 % IV SOLN: 100.0000 mL | INTRAVENOUS | Status: DC | PRN

## 2013-01-10 MED ORDER — LIDOCAINE-PRILOCAINE 2.5-2.5 % EX CREA: 1.0000 "application " | TOPICAL_CREAM | CUTANEOUS | Status: DC | PRN

## 2013-01-10 MED ORDER — LANTHANUM CARBONATE 500 MG PO CHEW: 500.0000 mg | CHEWABLE_TABLET | Freq: Three times a day (TID) | ORAL | Status: AC

## 2013-01-10 MED ORDER — ALTEPLASE 2 MG IJ SOLR
2.0000 mg | Freq: Once | INTRAMUSCULAR | Status: DC | PRN
  Filled 2013-01-10: qty 2

## 2013-01-10 MED ORDER — HEPARIN SODIUM (PORCINE) 1000 UNIT/ML DIALYSIS: 1000.0000 [IU] | INTRAMUSCULAR | Status: DC | PRN

## 2013-01-10 MED ORDER — COLLAGENASE 250 UNIT/GM EX OINT: TOPICAL_OINTMENT | Freq: Every day | CUTANEOUS | Status: DC

## 2013-01-10 MED ORDER — PENTAFLUOROPROP-TETRAFLUOROETH EX AERO: 1.0000 "application " | INHALATION_SPRAY | CUTANEOUS | Status: DC | PRN

## 2013-01-10 MED ORDER — LIDOCAINE HCL (PF) 1 % IJ SOLN: 5.0000 mL | INTRAMUSCULAR | Status: DC | PRN

## 2013-01-10 MED ORDER — HEPARIN SODIUM (PORCINE) 1000 UNIT/ML DIALYSIS
5000.0000 [IU] | Freq: Once | INTRAMUSCULAR | Status: AC
  Administered 2013-01-10: 5000 [IU] via INTRAVENOUS_CENTRAL

## 2013-01-10 NOTE — Clinical Social Work Psychosocial (Signed)
Clinical Social Work Department BRIEF PSYCHOSOCIAL ASSESSMENT AND DISCHARGE NOTE 01/10/2013  Patient:  [name], [name]     Account Number:  [id number]     Admit date:  12/27/2012  Clinical Social Worker:  Delmer Islam  Date/Time:  01/10/2013 06:44 AM  Referred by:  Physician  Date Referred:  01/27/2013 Referred for  SNF Placement   Other Referral:   Interview type:  Family Other interview type:    PSYCHOSOCIAL DATA Living Status:  FACILITY Admitted from facility:  [facility] Level of care:  Skilled Nursing Facility Primary support name:  Romello Hoehn Primary support relationship to patient:  SPOUSE Degree of support available:   Wife, Mrs. Machia and children: [name] and Glendel Jaggers very involved and concerned about patient's care at [facility] and his well-being.    CURRENT CONCERNS Current Concerns  Post-Acute Placement   Other Concerns:    SOCIAL WORK ASSESSMENT / PLAN CSW talked with family about SNF placement as initially they were  interested in finding another skilled facility. CSW talked with wife by phone on5/29 and the family together at the hospital on 5/30 and the family decided to have patient return to [facility] and take their time in looking for another facility.   Assessment/plan status:  No Further Intervention Required Other assessment/ plan:   Information/referral to community resources:    PATIENT'S/FAMILY'S RESPONSE TO PLAN OF CARE: Family agreeable to patient returning to [facility]. Patient discharged 01/10/13 back to SNF, transported by ambulance.

## 2013-01-10 NOTE — Progress Notes (Signed)
Subjective:   No complaints, anticipating discharge to [facility] after dialysis today  Objective: Vital signs in last 24 hours: Temp:  [97.4 F (36.3 C)-98.6 F (37 C)] 98.6 F (37 C) (06/01 2206) Pulse Rate:  [102-108] 104 (06/01 2206) Resp:  [17-18] 18 (06/01 2206) BP: (97-115)/(23-65) 108/65 mmHg (06/01 2206) SpO2:  [99 %-100 %] 99 % (06/01 2206) Weight:  [76.8 kg (169 lb 5 oz)] 76.8 kg (169 lb 5 oz) (06/01 2206) Weight change: -0.538 kg (-1 lb 3 oz)  Intake/Output from previous day: 06/01 0701 - 06/02 0700 In: 220 [P.O.:220] Out: -    EXAM: General appearance:  Alert, comfortable Resp:  CTA with rales, rhonchi, or wheezes Cardio:  RRR without murmur or rub GI: + BS, soft and nontender Extremities:  No edema on right, protective boot on left Access:  AVF @ LFA with + bruit  Lab Results:  Recent Labs  01/08/13 1355  WBC 12.9*  HGB 9.0*  HCT 27.7*  PLT 489*   BMET:  Recent Labs  01/08/13 1355  NA 139  K 3.2*  CL 98  CO2 33*  GLUCOSE 119*  BUN 8  CREATININE 2.28*  CALCIUM 9.2  ALBUMIN 2.4*   No results found for this basename: PTH,  in the last 72 hours Iron Studies: No results found for this basename: IRON, TIBC, TRANSFERRIN, FERRITIN,  in the last 72 hours  [facility] 3hr 45 min EDW 83.5 kg 2K/2CA Bath LFA AVF BFR 500/ DFR A1.5 Epo 15000u, Heparin 5000 u. Venofer 100 mg IV/HD through 12/29/12   Assessment/Plan: 1. Sepsis/C diff - afebrile, hypotension resolved; on PO Vancomycin through today. 2. Decubitus ulcers - sacrum and left heel - currently without pain, air mattress with dressing changes. 3. ESRD - HD on MWF @ [place]; tolerated in recliner 5/27, 5/29, & 5/31; last pre-HD K 3.2.  HD today 4. Anemia - last Hgb 9, on Aranesp 100 mcg on Thurs. 5. Secondary hyperparathyroidism - Ca 9.2  (10.5 corrected), P 2.6; 2Ca bath, Sensipar 30 mg bid, Fosrenol 500 mg with meals. 6. HTN/Volume - BP stable, most recently 108/65, no mdes; wt 76.8,  below EDW 83.5 kg.  New EDW at discharge. 7. Nutrition - Alb up to 2.4; now regular diet, Nepro 8. EOL issues - Discharge likely to [facility] today; now DNR; will continue HD as long as he tolerates sitting in recliner. 9. Disp - DNR, discharge to SNF today.   LOS: 14 days   [name],[name] 01/10/2013,6:47 AM  Patient seen and examined.  Agree with assessment and plan as above. Patrick Moselle  MD [phone] pgr    [phone] cell 01/10/2013, 2:50 PM

## 2013-01-10 NOTE — Progress Notes (Signed)
Report called to Mardella Layman, Charity fundraiser at [place].  Patient stable for discharge.  Called MD to clarify vancomycin orders.  MD advised that patient does not need oral vancomycin at discharge.  Social worker is calling for transport. Will monitor patient until discharge.

## 2013-01-10 NOTE — Discharge Summary (Signed)
Physician Discharge Summary  [name] ZOX:[id number] DOB: [birth year] DOA: 12/27/2012  PCP: Patrick Givens, MD  Admit date: 12/27/2012 Discharge date: 01/10/2013  Time spent: >30 minutes  Recommendations for Outpatient Follow-up:  The patient is unable to tolerate dialysis, or becomes septic, or otherwise unstable, consider hospice and comfort care  Discharge Diagnoses:  Principal Problem:   Sepsis Active Problems:   Diabetes mellitus with end stage renal disease   HYPERTENSION   End stage renal disease   CKD (chronic kidney disease) stage V requiring chronic dialysis   Anemia of chronic disease   History of CVA (cerebrovascular accident)   Decubitus ulcer of sacral region, stage 4   Decubitus ulcer of right heel, unstageable   Clostridium difficile colitis   Severe malnutrition  Discharge Condition: stable  Diet recommendation: general  Filed Weights   01/08/13 0700 01/08/13 2021 01/09/13 2206  Weight: 78.2 kg (172 lb 6.4 oz) 77.338 kg (170 lb 8 oz) 76.8 kg (169 lb 5 oz)    History of present illness:  82 yr. Old AAM with pmhx significant for ESRD on HD M/W/F, OA, hx CVA, HTN, hx CVA, likely underlying vascular dementia, was sent from [facility] facility due to a low H/H from baseline. The patient presents with his sister, [name] [phone]) at bedside who gives some limited history. She states he had some blood work and was found to be more anemic and sent to the ED. She denies any hx of diarrhea, cough, abdominal pain, SOB. She states he is bed bound and has known sacral/foot ulcers. She states these are well cared for. The patient, though only oriented to self (his baseline per sister), denies any bleeding or diarrhea.  In the ED he was noted to have a HR of 120, BP 104/45, RR 24 which has increased to 32, but no fever. He is curiously noted to be hypokalemic with a K of 2.9 (ESRD patient, last HD today). He had a notable WBC of 25k with no bands noted. He had a  negative FOBT. His H/H was 8/24, compared to last known value of 10/28.    Hospital Course:   Patient was admitted and started on broad-spectrum antibiotics. He was given IV fluid. Step down unit. Initially was full code. Patient had a difficult time with his pressure sores and refused hydrotherapy due to pain. There was concern that he would be unable to tolerate hemodialysis and at one point he was made comfort care. This was reversed after he started improving per his request, and family members input. Palliative care was involved. He is currently able to tolerate hemodialysis, but remains DO NOT RESUSCITATE. Should he have problems tolerating dialysis, or should he become acutely ill, family would like to stop dialysis and make him comfort care.  Sepsis   Secondary to C. difficile colitis: Patient has received a 14 day course of treatment. His diarrhea has essentially resolved. He is hemodynamically stable  Clostridium difficile colitis - severe type   CKD (chronic kidney disease) stage V requiring chronic dialysis  Tolerating dialysis every Monday Wednesday Friday.  Anemia of chronic disease  Remained stable  History of CVA (cerebrovascular accident)  -with vascular dementia  The patient remains pleasantly confused and lacks capacity for informed consent. Any decision to change goals of care should be made with family's input.  Decubitus ulcer of buttock, stage 4/Decubitus ulcer of heel, stage 2  Patient had a wound care consult. Initially, hydrotherapy was recommended to his sacral wound, but he  was unable to tolerate this. He is currently getting Santyl enzymatic debridement with packing daily. He has an unstageable right heel pressure sore and currently has a phone booty in place for off loading of pressure  Protein calorie malnutrition , severe. patient received health shakes and dietitian consultation.   CODE STATUS DO NOT RESUSCITATE  Consultations:  nephrology Palliative  care   Discharge Exam: Filed Vitals:   01/10/13 0857 01/10/13 0926 01/10/13 0957 01/10/13 1027  BP: 115/71 123/71 118/71 121/65  Pulse: 99 100 102 104  Temp:      TempSrc:      Resp: 16 16 16 16   Height:      Weight:      SpO2:        General: comfortable and smiling in dialysis. Confused. Lungs clear to auscultation bilaterally without wheezes rhonchi or rales Cardiovascular regular rate rhythm without murmurs gallops rubs Abdomen soft nontender Extremities no edema   Discharge Instructions  Discharge Orders   Future Orders Complete By Expires     Diet general  As directed     Discharge instructions  As directed     Comments:      Air overlay mattress.    Walk with assistance  As directed     Walker   As directed         Medication List    TAKE these medications       collagenase ointment  Commonly known as:  SANTYL  Apply topically daily.     dipyridamole-aspirin 200-25 MG per 12 hr capsule  Commonly known as:  AGGRENOX  Take 1 capsule by mouth 2 (two) times daily.     feeding supplement (NEPRO CARB STEADY) Liqd  Take 237 mLs by mouth 2 (two) times daily between meals.     Fish Oil 1000 MG Caps  Take 1 capsule by mouth daily.     gabapentin 100 MG capsule  Commonly known as:  NEURONTIN  Take 100 mg by mouth daily.     lanthanum 500 MG chewable tablet  Commonly known as:  FOSRENOL  Chew 1 tablet (500 mg total) by mouth 3 (three) times daily with meals.     multivitamin Tabs tablet  Take 1 tablet by mouth daily.     oxyCODONE 5 MG immediate release tablet  Commonly known as:  Oxy IR/ROXICODONE  Take 1 tablet (5 mg total) by mouth every 8 (eight) hours as needed for pain.     SENSIPAR 30 MG tablet  Generic drug:  cinacalcet  Take 30 mg by mouth 2 (two) times daily.       No Known Allergies    The results of significant diagnostics from this hospitalization (including imaging, microbiology, ancillary and laboratory) are listed below for  reference.    Significant Diagnostic Studies: Dg Chest Portable 1 View  12/27/2012   *RADIOLOGY REPORT*  Clinical Data: Sepsis.  PORTABLE CHEST - 1 VIEW  Comparison: 12/27/2012.  Findings: The heart is enlarged but stable.  The mediastinal and hilar contours are prominent but unchanged.  There is tortuosity, ectasia and calcification of the thoracic aorta.  There are mild chronic bronchitic type interstitial lung changes but no definite acute overlying pulmonary process.  The bony thorax is grossly intact.  IMPRESSION: Chronic lung changes but no acute pulmonary findings.   Original Report Authenticated By: Rudie Meyer, M.D.   Dg Chest Portable 1 View  12/27/2012   *RADIOLOGY REPORT*  Clinical Data: Anemia, weakness, end-stage renal disease  on dialysis  PORTABLE CHEST - 1 VIEW  Comparison: Portable exam 2050 hours compared to 11/03/2012  Findings: Upper normal heart size. Atherosclerotic calcification aorta. Stable mediastinal contours for degree of rotation. Mild perihilar infiltrates left greater than right question asymmetric edema or infiltrate. No gross pleural effusion or pneumothorax. No acute osseous findings.  IMPRESSION: Question minimal perihilar infiltrates versus edema.   Original Report Authenticated By: Ulyses Southward, M.D.    Microbiology: No results found for this or any previous visit (from the past 240 hour(s)).   Labs: Basic Metabolic Panel:  Recent Labs Lab 01/06/13 0644 01/08/13 1355 01/10/13 0828  NA 135 139 138  K 3.9 3.2* 4.1  CL 95* 98 96  CO2 27 33* 30  GLUCOSE 102* 119* 99  BUN 35* 8 27*  CREATININE 5.74* 2.28* 5.52*  CALCIUM 10.3 9.2 9.8  PHOS 5.5* 2.6 5.3*   Liver Function Tests:  Recent Labs Lab 01/06/13 0644 01/08/13 1355 01/10/13 0828  ALBUMIN 2.3* 2.4* 2.4*   No results found for this basename: LIPASE, AMYLASE,  in the last 168 hours No results found for this basename: AMMONIA,  in the last 168 hours CBC:  Recent Labs Lab 01/06/13 0644  01/08/13 1355  WBC 12.9* 12.9*  HGB 8.7* 9.0*  HCT 26.7* 27.7*  MCV 85.0 87.1  PLT 592* 489*   Cardiac Enzymes: No results found for this basename: CKTOTAL, CKMB, CKMBINDEX, TROPONINI,  in the last 168 hours BNP: BNP (last 3 results) No results found for this basename: PROBNP,  in the last 8760 hours CBG:  Recent Labs Lab 01/09/13 0732 01/09/13 1130 01/09/13 1622 01/09/13 2204 01/10/13 0729  GLUCAP 100* 167* 113* 96 92   Signed:  Lucelia Lacey L  [facility] 01/10/2013, 10:49 AM

## 2013-01-11 ENCOUNTER — Other Ambulatory Visit: Payer: Self-pay | Admitting: *Deleted

## 2013-01-11 MED ORDER — OXYCODONE HCL 5 MG PO TABS: 5.0000 mg | ORAL_TABLET | Freq: Three times a day (TID) | ORAL | Status: DC | PRN

## 2013-01-14 NOTE — Progress Notes (Signed)
Date: 01/14/2013  MRN:  [id number] Name:  [name] Sex:  male Age:  79 y.o. DOB:1930-08-26                        Facility/Room;[facility] Level Of Care: Provider: Dr. Murray Hodgkins  Emergency Contacts: Contact Information   Name Relation Home Work Mobile   [name] Spouse [phone]     [name] Daughter [phone]  [phone]      Code Status:Full Code MOST Form:  Allergies:No Known Allergies   Chief Complaint  Patient presents with  . Medical Managment of Chronic Issues    Re-admit to SNF following hospitalization for sepsis     HPI: 79 yr. Old AAM with pmhx significant for ESRD on HD M/W/F, OA, hx CVA, HTN, hx CVA, likely underlying vascular dementia,on 12/27/12,  was sent from [facility] facility due to a low H/H from baseline. The patient presents with his sister, [name] [phone]) at bedside who gives some limited history. She states he had some blood work and was found to be more anemic and sent to the ED. She denies any hx of diarrhea, cough, abdominal pain, SOB. She states he is bed bound and has known sacral/foot ulcers. She states these are well cared for. The patient, though only oriented to self (his baseline per sister), denies any bleeding or diarrhea.  In the ED he was noted to have a HR of 120, BP 104/45, RR 24 which has increased to 32, but no fever. He is curiously noted to be hypokalemic with a K of 2.9 (ESRD patient, last HD today). He had a notable WBC of 25k with no bands noted. He had a negative FOBT. His H/H was 8/24, compared to last known value of 10/28. On 01/10/2013 patient was discharged back to [facility] for SNF.      Past Medical History  Diagnosis Date  . Hypertension   . Diabetes mellitus     typeII / No meds  . Cataract 01/2002    Right  . Dialysis patient 10/13/2007  . Radiculopathy 05/14/2006    NCV study negative carpal tunnel, Left C5 radiculopathy  . Arthritis     Knee pain  . ESRD (end stage renal  disease) on dialysis 2009    [facility] diaylsis Monday , Wed. and Friday per Dr. Kathrene Bongo  . Stroke   . Elevated PSA     h/o, pt had declined further eval.   . Syncope 11/02/2012    Past Surgical History  Procedure Laterality Date  . Varicose vein surgery  1978  . External fixation wrist fracture  1990  . Thyroidectomy, partial  08/03/2003    Right thyroid lobectomy, subtotal parathyr. sec hyperparthyroidismadenosis  . Av fistula placement  07/2004    Left arm AV fistula placement [name] via Jay)  . Eye surgery      Cataract     Procedures:  12/27/2012  PORTABLE CHEST - 1 VIEW. IMPRESSION: Chronic lung changes but no acute pulmonary findings. Original Report Authenticated By: Rudie Meyer, M.D.   12/27/2012  PORTABLE CHEST - 1 VIEW   IMPRESSION: Question minimal perihilar infiltrates versus edema. Original Report Authenticated By: Ulyses Southward, M.D.   Consultants:  Dr. Arlean Hopping Nephrology   Dr. Nat Christen PCP  Current Outpatient Prescriptions  Medication Sig Dispense Refill  . cinacalcet (SENSIPAR) 30 MG tablet Take 30 mg by mouth 2 (two) times daily.       . collagenase (SANTYL) ointment Apply topically  daily.  15 g  0  . dipyridamole-aspirin (AGGRENOX) 200-25 MG per 12 hr capsule Take 1 capsule by mouth 2 (two) times daily.      [name] Kitchen gabapentin (NEURONTIN) 100 MG capsule Take 100 mg by mouth daily.      [name] Kitchen lanthanum (FOSRENOL) 500 MG chewable tablet Chew 1 tablet (500 mg total) by mouth 3 (three) times daily with meals.      . multivitamin (RENA-VIT) TABS tablet Take 1 tablet by mouth daily.        . Nutritional Supplements (FEEDING SUPPLEMENT, NEPRO CARB STEADY,) LIQD Take 237 mLs by mouth 2 (two) times daily between meals.      . Omega-3 Fatty Acids (FISH OIL) 1000 MG CAPS Take 1 capsule by mouth daily.        [name] Kitchen oxyCODONE (OXY IR/ROXICODONE) 5 MG immediate release tablet Take 1 tablet (5 mg total) by mouth every 8 (eight) hours as needed for pain.  90 tablet  0    No current facility-administered medications for this visit.    Immunization History  Administered Date(s) Administered  . Influenza Whole 07/11/2005, 05/16/2008, 05/25/2009  . Pneumococcal Polysaccharide 05/11/1998  . Td 11/04/2006     Diet:  History  Substance Use Topics  . Smoking status: Former Smoker -- 10 years    Types: Cigarettes    Quit date: 04/01/2002  . Smokeless tobacco: Never Used     Comment: quit ove 20 years  . Alcohol Use: No    Family History  Problem Relation Age of Onset  . Cancer Father 13    Lung Cancer//? stomach cancer  . Cancer Sister 36    brain tumor       Vital signs: BP 120/78  Pulse 115  Resp 20  Ht 6' (1.829 m)  Wt 169 lb (76.658 kg)  BMI 22.92 kg/m2   General Appearance:    Alert, cooperative, no distress, appears stated age  Head:    Normocephalic, without obvious abnormality, atraumatic  Eyes:    PERRL, conjunctiva/corneas clear, EOM's intact, fundi    benign, both eyes       Ears:    Normal TM's and external ear canals, both ears  Nose:   Nares normal, septum midline, mucosa normal, no drainage   or sinus tenderness  Throat:   Lips, mucosa, and tongue normal; teeth and gums normal  Neck:   Supple, symmetrical, trachea midline, no adenopathy;       thyroid:  No enlargement/tenderness/nodules; no carotid   bruit or JVD  Back:     Symmetric, no curvature, ROM normal, no CVA tenderness  Lungs:     Clear to auscultation bilaterally, respirations unlabored  Chest wall:    No tenderness or deformity  Heart:    Regular rate and rhythm, S1 and S2 normal, no murmur, rub   or gallop  Abdomen:     Soft, non-tender, bowel sounds active all four quadrants,    no masses, no organomegaly  Genitalia:    Normal male without lesion, discharge or tenderness  Rectal:    Normal tone, normal prostate, no masses or tenderness;   guaiac negative stool  Extremities:   Extremities normal, atraumatic, no cyanosis or edema  Pulses:   2+ and  symmetric all extremities  Skin:   Skin color, texture, turgor normal, no rashes or lesions  Lymph nodes:   Cervical, supraclavicular, and axillary nodes normal  Neurologic:   CNII-XII intact. Normal strength, sensation and reflexes  throughout    Screening Score  MMS    PHQ2    PHQ9     Fall Risk    BIMS    Basic Metabolic Panel:  Recent Labs  Lab  01/06/13 0644  01/08/13 1355  01/10/13 0828   NA  135  139  138   K  3.9  3.2*  4.1   CL  95*  98  96   CO2  27  33*  30   GLUCOSE  102*  119*  99   BUN  35*  8  27*   CREATININE  5.74*  2.28*  5.52*   CALCIUM  10.3  9.2  9.8   PHOS  5.5*  2.6  5.3*   Liver Function Tests:  Recent Labs  Lab  01/06/13 0644  01/08/13 1355  01/10/13 0828   ALBUMIN  2.3*  2.4*  2.4*   No results found for this basename: LIPASE, AMYLASE, in the last 168 hours  No results found for this basename: AMMONIA, in the last 168 hours  CBC:  Recent Labs  Lab  01/06/13 0644  01/08/13 1355   WBC  12.9*  12.9*   HGB  8.7*  9.0*   HCT  26.7*  27.7*   MCV  85.0  87.1   PLT  592*  489*   Cardiac Enzymes:  No results found for this basename: CKTOTAL, CKMB, CKMBINDEX, TROPONINI, in the last 168 hours  BNP:  BNP (last 3 results)  No results found for this basename: PROBNP, in the last 8760 hours  CBG:  Recent Labs  Lab  01/09/13 0732  01/09/13 1130  01/09/13 1622  01/09/13 2204  01/10/13 0729   GLUCAP  100*  167*  113*  96  92       Annual summary: Hospitalizations:   Infection History:  Functional assessment: Areas of potential improvement: Rehabilitation Potential: Prognosis for survival: Plan:

## 2013-01-26 ENCOUNTER — Other Ambulatory Visit: Payer: Self-pay | Admitting: Geriatric Medicine

## 2013-01-26 MED ORDER — OXYCODONE HCL 5 MG PO TABS: 5.0000 mg | ORAL_TABLET | Freq: Four times a day (QID) | ORAL | Status: DC | PRN

## 2013-01-29 ENCOUNTER — Emergency Department (HOSPITAL_COMMUNITY): Payer: Medicare Other

## 2013-01-29 ENCOUNTER — Encounter (HOSPITAL_COMMUNITY): Payer: Self-pay | Admitting: Internal Medicine

## 2013-01-29 ENCOUNTER — Inpatient Hospital Stay (HOSPITAL_COMMUNITY)
Admission: EM | Admit: 2013-01-29 | Discharge: 2013-02-16 | DRG: 853 | Disposition: A | Discharge status: Skilled Nursing Facility | Payer: Medicare Other | Attending: Internal Medicine | Admitting: Internal Medicine

## 2013-01-29 DIAGNOSIS — K921 Melena: Secondary | ICD-10-CM

## 2013-01-29 DIAGNOSIS — A419 Sepsis, unspecified organism: Secondary | ICD-10-CM

## 2013-01-29 DIAGNOSIS — M549 Dorsalgia, unspecified: Secondary | ICD-10-CM

## 2013-01-29 DIAGNOSIS — E1122 Type 2 diabetes mellitus with diabetic chronic kidney disease: Secondary | ICD-10-CM

## 2013-01-29 DIAGNOSIS — Z515 Encounter for palliative care: Secondary | ICD-10-CM

## 2013-01-29 DIAGNOSIS — T82898A Other specified complication of vascular prosthetic devices, implants and grafts, initial encounter: Secondary | ICD-10-CM

## 2013-01-29 DIAGNOSIS — D638 Anemia in other chronic diseases classified elsewhere: Secondary | ICD-10-CM

## 2013-01-29 DIAGNOSIS — IMO0002 Reserved for concepts with insufficient information to code with codable children: Secondary | ICD-10-CM

## 2013-01-29 DIAGNOSIS — E559 Vitamin D deficiency, unspecified: Secondary | ICD-10-CM

## 2013-01-29 DIAGNOSIS — R6521 Severe sepsis with septic shock: Secondary | ICD-10-CM

## 2013-01-29 DIAGNOSIS — M129 Arthropathy, unspecified: Secondary | ICD-10-CM | POA: Diagnosis present

## 2013-01-29 DIAGNOSIS — E1149 Type 2 diabetes mellitus with other diabetic neurological complication: Secondary | ICD-10-CM

## 2013-01-29 DIAGNOSIS — Z8673 Personal history of transient ischemic attack (TIA), and cerebral infarction without residual deficits: Secondary | ICD-10-CM

## 2013-01-29 DIAGNOSIS — T798XXA Other early complications of trauma, initial encounter: Secondary | ICD-10-CM

## 2013-01-29 DIAGNOSIS — N2581 Secondary hyperparathyroidism of renal origin: Secondary | ICD-10-CM | POA: Diagnosis present

## 2013-01-29 DIAGNOSIS — I120 Hypertensive chronic kidney disease with stage 5 chronic kidney disease or end stage renal disease: Secondary | ICD-10-CM | POA: Diagnosis present

## 2013-01-29 DIAGNOSIS — L8992 Pressure ulcer of unspecified site, stage 2: Secondary | ICD-10-CM | POA: Diagnosis present

## 2013-01-29 DIAGNOSIS — L89309 Pressure ulcer of unspecified buttock, unspecified stage: Secondary | ICD-10-CM | POA: Diagnosis present

## 2013-01-29 DIAGNOSIS — D649 Anemia, unspecified: Secondary | ICD-10-CM

## 2013-01-29 DIAGNOSIS — Z992 Dependence on renal dialysis: Secondary | ICD-10-CM

## 2013-01-29 DIAGNOSIS — E43 Unspecified severe protein-calorie malnutrition: Secondary | ICD-10-CM | POA: Insufficient documentation

## 2013-01-29 DIAGNOSIS — M869 Osteomyelitis, unspecified: Secondary | ICD-10-CM | POA: Diagnosis present

## 2013-01-29 DIAGNOSIS — L89899 Pressure ulcer of other site, unspecified stage: Secondary | ICD-10-CM | POA: Diagnosis present

## 2013-01-29 DIAGNOSIS — Z66 Do not resuscitate: Secondary | ICD-10-CM | POA: Diagnosis present

## 2013-01-29 DIAGNOSIS — A0472 Enterocolitis due to Clostridium difficile, not specified as recurrent: Secondary | ICD-10-CM

## 2013-01-29 DIAGNOSIS — T148XXA Other injury of unspecified body region, initial encounter: Secondary | ICD-10-CM

## 2013-01-29 DIAGNOSIS — L89109 Pressure ulcer of unspecified part of back, unspecified stage: Secondary | ICD-10-CM | POA: Diagnosis present

## 2013-01-29 DIAGNOSIS — N186 End stage renal disease: Secondary | ICD-10-CM

## 2013-01-29 DIAGNOSIS — N189 Chronic kidney disease, unspecified: Secondary | ICD-10-CM | POA: Diagnosis present

## 2013-01-29 DIAGNOSIS — A4102 Sepsis due to Methicillin resistant Staphylococcus aureus: Principal | ICD-10-CM | POA: Diagnosis present

## 2013-01-29 DIAGNOSIS — I10 Essential (primary) hypertension: Secondary | ICD-10-CM

## 2013-01-29 DIAGNOSIS — L89609 Pressure ulcer of unspecified heel, unspecified stage: Secondary | ICD-10-CM | POA: Diagnosis present

## 2013-01-29 DIAGNOSIS — Z87891 Personal history of nicotine dependence: Secondary | ICD-10-CM

## 2013-01-29 DIAGNOSIS — L89154 Pressure ulcer of sacral region, stage 4: Secondary | ICD-10-CM

## 2013-01-29 DIAGNOSIS — L8990 Pressure ulcer of unspecified site, unspecified stage: Secondary | ICD-10-CM | POA: Diagnosis present

## 2013-01-29 DIAGNOSIS — R55 Syncope and collapse: Secondary | ICD-10-CM

## 2013-01-29 DIAGNOSIS — M1712 Unilateral primary osteoarthritis, left knee: Secondary | ICD-10-CM

## 2013-01-29 DIAGNOSIS — M25562 Pain in left knee: Secondary | ICD-10-CM

## 2013-01-29 DIAGNOSIS — L8994 Pressure ulcer of unspecified site, stage 4: Secondary | ICD-10-CM

## 2013-01-29 DIAGNOSIS — E876 Hypokalemia: Secondary | ICD-10-CM

## 2013-01-29 DIAGNOSIS — R972 Elevated prostate specific antigen [PSA]: Secondary | ICD-10-CM

## 2013-01-29 DIAGNOSIS — R634 Abnormal weight loss: Secondary | ICD-10-CM

## 2013-01-29 DIAGNOSIS — D631 Anemia in chronic kidney disease: Secondary | ICD-10-CM | POA: Diagnosis present

## 2013-01-29 LAB — CBC WITH DIFFERENTIAL/PLATELET
Basophils Absolute: 0.1 10*3/uL (ref 0.0–0.1)
Basophils Absolute: 0.3 10*3/uL — ABNORMAL HIGH (ref 0.0–0.1)
Basophils Relative: 0 % (ref 0–1)
Eosinophils Absolute: 0.1 10*3/uL (ref 0.0–0.7)
Eosinophils Relative: 1 % (ref 0–5)
HCT: 31.4 % — ABNORMAL LOW (ref 39.0–52.0)
Lymphs Abs: 1.8 10*3/uL (ref 0.7–4.0)
MCH: 27.2 pg (ref 26.0–34.0)
MCHC: 33.3 g/dL (ref 30.0–36.0)
MCV: 84.4 fL (ref 78.0–100.0)
Monocytes Absolute: 2.1 10*3/uL — ABNORMAL HIGH (ref 0.1–1.0)
Monocytes Relative: 8 % (ref 3–12)
Neutro Abs: 16.8 10*3/uL — ABNORMAL HIGH (ref 1.7–7.7)
Neutro Abs: 21.2 10*3/uL — ABNORMAL HIGH (ref 1.7–7.7)
Neutrophils Relative %: 90 % — ABNORMAL HIGH (ref 43–77)
Platelets: 382 10*3/uL (ref 150–400)
Platelets: 460 10*3/uL — ABNORMAL HIGH (ref 150–400)
RDW: 16.7 % — ABNORMAL HIGH (ref 11.5–15.5)
RDW: 16.5 % — ABNORMAL HIGH (ref 11.5–15.5)

## 2013-01-29 LAB — COMPREHENSIVE METABOLIC PANEL
ALT: 25 U/L (ref 0–53)
AST: 45 U/L — ABNORMAL HIGH (ref 0–37)
Albumin: 2 g/dL — ABNORMAL LOW (ref 3.5–5.2)
Alkaline Phosphatase: 43 U/L (ref 39–117)
Alkaline Phosphatase: 71 U/L (ref 39–117)
CO2: 20 mEq/L (ref 19–32)
Calcium: 5.9 mg/dL — CL (ref 8.4–10.5)
Chloride: 113 mEq/L — ABNORMAL HIGH (ref 96–112)
Chloride: 99 mEq/L (ref 96–112)
GFR calc Af Amer: 30 mL/min — ABNORMAL LOW (ref >90)
GFR calc non Af Amer: 26 mL/min — ABNORMAL LOW (ref >90)
Glucose, Bld: 80 mg/dL (ref 70–99)
Potassium: 2.2 mEq/L — CL (ref 3.5–5.1)
Potassium: 4.2 mEq/L (ref 3.5–5.1)
Sodium: 142 mEq/L (ref 135–145)
Sodium: 137 mEq/L (ref 135–145)
Total Bilirubin: 0.2 mg/dL — ABNORMAL LOW (ref 0.3–1.2)
Total Bilirubin: 0.4 mg/dL (ref 0.3–1.2)
Total Protein: 7.4 g/dL (ref 6.0–8.3)

## 2013-01-29 LAB — URINE MICROSCOPIC-ADD ON

## 2013-01-29 LAB — POCT I-STAT 3, VENOUS BLOOD GAS (G3P V)
Acid-Base Excess: 3 mmol/L — ABNORMAL HIGH (ref 0.0–2.0)
Bicarbonate: 29.2 mEq/L — ABNORMAL HIGH (ref 20.0–24.0)
O2 Saturation: 47 %
TCO2: 31 mmol/L (ref 0–100)
pO2, Ven: 26 mmHg — CL (ref 30.0–45.0)

## 2013-01-29 LAB — URINALYSIS, ROUTINE W REFLEX MICROSCOPIC
Bilirubin Urine: NEGATIVE
Protein, ur: 100 mg/dL — AB
Specific Gravity, Urine: 1.012 (ref 1.005–1.030)
Urobilinogen, UA: 0.2 mg/dL (ref 0.0–1.0)

## 2013-01-29 LAB — POCT I-STAT, CHEM 8
BUN: 23 mg/dL (ref 6–23)
Calcium, Ion: 0.84 mmol/L — ABNORMAL LOW (ref 1.13–1.30)
Chloride: 112 mEq/L (ref 96–112)
Sodium: 145 mEq/L (ref 135–145)

## 2013-01-29 LAB — LACTIC ACID, PLASMA: Lactic Acid, Venous: 1.6 mmol/L (ref 0.5–2.2)

## 2013-01-29 LAB — MRSA PCR SCREENING: MRSA by PCR: NEGATIVE

## 2013-01-29 LAB — POCT I-STAT TROPONIN I: Troponin i, poc: 0.01 ng/mL (ref 0.00–0.08)

## 2013-01-29 LAB — MAGNESIUM: Magnesium: 1.4 mg/dL — ABNORMAL LOW (ref 1.5–2.5)

## 2013-01-29 LAB — CG4 I-STAT (LACTIC ACID): Lactic Acid, Venous: 1.89 mmol/L (ref 0.5–2.2)

## 2013-01-29 LAB — PREPARE RBC (CROSSMATCH)

## 2013-01-29 MED ORDER — SODIUM CHLORIDE 0.9 % IV BOLUS (SEPSIS)
1000.0000 mL | Freq: Once | INTRAVENOUS | Status: AC
  Administered 2013-01-29: 1000 mL via INTRAVENOUS

## 2013-01-29 MED ORDER — DEXTROSE 5 % IV SOLN
2.0000 g | Freq: Once | INTRAVENOUS | Status: AC
  Administered 2013-01-29: 2 g via INTRAVENOUS
  Filled 2013-01-29: qty 2

## 2013-01-29 MED ORDER — DARBEPOETIN ALFA-POLYSORBATE 60 MCG/0.3ML IJ SOLN
60.0000 ug | INTRAMUSCULAR | Status: DC
  Administered 2013-01-31: 60 ug via INTRAVENOUS
  Filled 2013-01-29 (×2): qty 0.3

## 2013-01-29 MED ORDER — SODIUM CHLORIDE 0.9 % IJ SOLN
3.0000 mL | Freq: Two times a day (BID) | INTRAMUSCULAR | Status: DC
  Administered 2013-01-29 – 2013-01-30 (×4): 3 mL via INTRAVENOUS

## 2013-01-29 MED ORDER — NEPRO/CARBSTEADY PO LIQD
237.0000 mL | Freq: Two times a day (BID) | ORAL | Status: DC
  Administered 2013-01-30 – 2013-02-16 (×24): 237 mL via ORAL

## 2013-01-29 MED ORDER — POTASSIUM CHLORIDE CRYS ER 20 MEQ PO TBCR
30.0000 meq | EXTENDED_RELEASE_TABLET | Freq: Once | ORAL | Status: DC
  Filled 2013-01-29: qty 1

## 2013-01-29 MED ORDER — ONDANSETRON HCL 4 MG/2ML IJ SOLN
4.0000 mg | Freq: Once | INTRAMUSCULAR | Status: AC
  Administered 2013-01-29: 4 mg via INTRAVENOUS
  Filled 2013-01-29: qty 2

## 2013-01-29 MED ORDER — ACETAMINOPHEN 325 MG PO TABS: 650.0000 mg | ORAL_TABLET | Freq: Four times a day (QID) | ORAL | Status: DC | PRN

## 2013-01-29 MED ORDER — VANCOMYCIN HCL IN DEXTROSE 750-5 MG/150ML-% IV SOLN
750.0000 mg | INTRAVENOUS | Status: DC
  Administered 2013-01-31 – 2013-02-04 (×3): 750 mg via INTRAVENOUS
  Filled 2013-01-29 (×5): qty 150

## 2013-01-29 MED ORDER — FISH OIL 1000 MG PO CAPS: 1.0000 | ORAL_CAPSULE | Freq: Every day | ORAL | Status: DC

## 2013-01-29 MED ORDER — DEXTROSE 5 % IV SOLN
2.0000 g | INTRAVENOUS | Status: DC
  Administered 2013-01-31: 2 g via INTRAVENOUS
  Filled 2013-01-29 (×2): qty 2

## 2013-01-29 MED ORDER — VANCOMYCIN HCL IN DEXTROSE 750-5 MG/150ML-% IV SOLN
750.0000 mg | Freq: Once | INTRAVENOUS | Status: AC
  Administered 2013-01-29: 750 mg via INTRAVENOUS
  Filled 2013-01-29: qty 150

## 2013-01-29 MED ORDER — SODIUM CHLORIDE 0.9 % IV SOLN
1.0000 g | Freq: Once | INTRAVENOUS | Status: AC
  Administered 2013-01-29: 1 g via INTRAVENOUS
  Filled 2013-01-29: qty 10

## 2013-01-29 MED ORDER — OXYCODONE HCL 5 MG PO TABS
5.0000 mg | ORAL_TABLET | Freq: Four times a day (QID) | ORAL | Status: DC | PRN
  Administered 2013-01-30: 5 mg via ORAL
  Filled 2013-01-29 (×2): qty 1

## 2013-01-29 MED ORDER — SODIUM CHLORIDE 0.9 % IJ SOLN
3.0000 mL | Freq: Two times a day (BID) | INTRAMUSCULAR | Status: DC
  Administered 2013-01-29 – 2013-02-15 (×32): 3 mL via INTRAVENOUS

## 2013-01-29 MED ORDER — LANTHANUM CARBONATE 500 MG PO CHEW
500.0000 mg | CHEWABLE_TABLET | Freq: Three times a day (TID) | ORAL | Status: DC
  Administered 2013-01-29 – 2013-02-16 (×46): 500 mg via ORAL
  Filled 2013-01-29 (×58): qty 1

## 2013-01-29 MED ORDER — CINACALCET HCL 30 MG PO TABS
30.0000 mg | ORAL_TABLET | Freq: Two times a day (BID) | ORAL | Status: DC
  Administered 2013-01-29 – 2013-02-16 (×29): 30 mg via ORAL
  Filled 2013-01-29 (×39): qty 1

## 2013-01-29 MED ORDER — VANCOMYCIN HCL IN DEXTROSE 1-5 GM/200ML-% IV SOLN
1000.0000 mg | Freq: Once | INTRAVENOUS | Status: AC
  Administered 2013-01-29: 1000 mg via INTRAVENOUS
  Filled 2013-01-29: qty 200

## 2013-01-29 MED ORDER — MAGNESIUM SULFATE 40 MG/ML IJ SOLN
2.0000 g | Freq: Once | INTRAMUSCULAR | Status: AC
  Administered 2013-01-29: 2 g via INTRAVENOUS
  Filled 2013-01-29: qty 50

## 2013-01-29 MED ORDER — ACETAMINOPHEN 650 MG RE SUPP
975.0000 mg | Freq: Once | RECTAL | Status: AC
  Administered 2013-01-29: 975 mg via RECTAL
  Filled 2013-01-29: qty 1

## 2013-01-29 MED ORDER — PIPERACILLIN-TAZOBACTAM IN DEX 2-0.25 GM/50ML IV SOLN
2.2500 g | Freq: Once | INTRAVENOUS | Status: AC
  Administered 2013-01-29: 2.25 g via INTRAVENOUS
  Filled 2013-01-29: qty 50

## 2013-01-29 MED ORDER — ACETAMINOPHEN 650 MG RE SUPP: 650.0000 mg | Freq: Four times a day (QID) | RECTAL | Status: DC | PRN

## 2013-01-29 MED ORDER — POTASSIUM CHLORIDE 10 MEQ/100ML IV SOLN
10.0000 meq | INTRAVENOUS | Status: DC
  Administered 2013-01-29 (×2): 10 meq via INTRAVENOUS
  Filled 2013-01-29 (×2): qty 100

## 2013-01-29 MED ORDER — RENA-VITE PO TABS
1.0000 | ORAL_TABLET | Freq: Every day | ORAL | Status: DC
  Administered 2013-01-29 – 2013-02-14 (×16): 1 via ORAL
  Administered 2013-02-15: 15:00:00 via ORAL
  Administered 2013-02-16: 1 via ORAL
  Filled 2013-01-29 (×21): qty 1

## 2013-01-29 MED ORDER — OMEGA-3-ACID ETHYL ESTERS 1 G PO CAPS
1.0000 g | ORAL_CAPSULE | Freq: Every day | ORAL | Status: DC
  Administered 2013-01-29 – 2013-02-16 (×18): 1 g via ORAL
  Filled 2013-01-29 (×19): qty 1

## 2013-01-29 MED ORDER — GABAPENTIN 100 MG PO CAPS
100.0000 mg | ORAL_CAPSULE | Freq: Every day | ORAL | Status: DC
  Administered 2013-01-29 – 2013-02-16 (×19): 100 mg via ORAL
  Filled 2013-01-29 (×20): qty 1

## 2013-01-29 MED ORDER — ONDANSETRON HCL 4 MG/2ML IJ SOLN: 4.0000 mg | Freq: Four times a day (QID) | INTRAMUSCULAR | Status: DC | PRN

## 2013-01-29 MED ORDER — GENTAMICIN IN SALINE 1-0.9 MG/ML-% IV SOLN
100.0000 mg | Freq: Once | INTRAVENOUS | Status: AC
  Administered 2013-01-29: 100 mg via INTRAVENOUS
  Filled 2013-01-29 (×2): qty 100

## 2013-01-29 MED ORDER — MORPHINE SULFATE 2 MG/ML IJ SOLN
2.0000 mg | Freq: Once | INTRAMUSCULAR | Status: AC
  Administered 2013-01-29: 2 mg via INTRAVENOUS
  Filled 2013-01-29: qty 1

## 2013-01-29 MED ORDER — ONDANSETRON HCL 4 MG PO TABS: 4.0000 mg | ORAL_TABLET | Freq: Four times a day (QID) | ORAL | Status: DC | PRN

## 2013-01-29 NOTE — ED Provider Notes (Signed)
History     CSN: [id number]  Arrival date & time 01/29/13  0006   First MD Initiated Contact with Patient 01/29/13 0012      Chief Complaint  Patient presents with  . Hypotension    (Consider location/radiation/quality/duration/timing/severity/associated sxs/prior treatment) HPI   [name] is a very unfortunate 79 year old man with end-stage renal disease, diabetes, recent episode of C. difficile colitis with sepsis, hypertension, stage IV sacral decubitus ulcer and dementia. He has been a nursing home resident since March 2014 after he sustained a fall.  He was brought to the emergency department by EMS from his nursing home. Staff reported to paramedics that the patient was hypotensive and clammy. The patient was dialyzed this afternoon via left arm graft.  The patient is unable to give much history. He denies pain. He endorses feeling weak. His family says that he has been "not acting like himself" for the past 5-7 days. He has been less communicative and interactive than normal.  Family says his temp has been around 59F following H/D  Past Medical History  Diagnosis Date  . Hypertension   . Diabetes mellitus     typeII / No meds  . Cataract 01/2002    Right  . Dialysis patient 10/13/2007  . Radiculopathy 05/14/2006    NCV study negative carpal tunnel, Left C5 radiculopathy  . Arthritis     Knee pain  . ESRD (end stage renal disease) on dialysis 2009    [facility] diaylsis Monday , Wed. and Friday per Dr. Kathrene Bongo  . Stroke   . Elevated PSA     h/o, pt had declined further eval.   . Syncope 11/02/2012    Past Surgical History  Procedure Laterality Date  . Varicose vein surgery  1978  . External fixation wrist fracture  1990  . Thyroidectomy, partial  08/03/2003    Right thyroid lobectomy, subtotal parathyr. sec hyperparthyroidismadenosis  . Av fistula placement  07/2004    Left arm AV fistula placement [name] via [facility])  . Eye surgery     Cataract    Family History  Problem Relation Age of Onset  . Cancer Father 34    Lung Cancer//? stomach cancer  . Cancer Sister 11    brain tumor    History  Substance Use Topics  . Smoking status: Former Smoker -- 10 years    Types: Cigarettes    Quit date: 04/01/2002  . Smokeless tobacco: Never Used     Comment: quit ove 20 years  . Alcohol Use: No      Review of Systems Unable to obtain from patient secondary to AMS  Allergies  Review of patient's allergies indicates no known allergies.  Home Medications   Current Outpatient Rx  Name  Route  Sig  Dispense  Refill  . cinacalcet (SENSIPAR) 30 MG tablet   Oral   Take 30 mg by mouth 2 (two) times daily.          . collagenase (SANTYL) ointment   Topical   Apply topically daily.   15 g   0   . dipyridamole-aspirin (AGGRENOX) 200-25 MG per 12 hr capsule   Oral   Take 1 capsule by mouth 2 (two) times daily.         [name] Kitchen gabapentin (NEURONTIN) 100 MG capsule   Oral   Take 100 mg by mouth daily.         [name] Kitchen lanthanum (FOSRENOL) 500 MG chewable tablet   Oral  Chew 1 tablet (500 mg total) by mouth 3 (three) times daily with meals.         . multivitamin (RENA-VIT) TABS tablet   Oral   Take 1 tablet by mouth daily.           . Nutritional Supplements (FEEDING SUPPLEMENT, NEPRO CARB STEADY,) LIQD   Oral   Take 237 mLs by mouth 2 (two) times daily between meals.         . Omega-3 Fatty Acids (FISH OIL) 1000 MG CAPS   Oral   Take 1 capsule by mouth daily.           [name] Kitchen oxyCODONE (OXY IR/ROXICODONE) 5 MG immediate release tablet   Oral   Take 1 tablet (5 mg total) by mouth every 6 (six) hours as needed for pain.   120 tablet   0     There were no vitals taken for this visit.  Physical Exam Gen: cachectic, chronically and acutely ill appearing Head: NCAT Eyes: PERL, EOMI Nose: no epistaixis or rhinorrhea Mouth/throat: mucosa is very dehydrated appearing, and pink, very poor oral hygeine is  noted Neck: supple, no stridor Lungs: RR 32/min, BS diminished bilaterally, no rhonchi, wheezing or rales appreciated CV: rapid and regular, very thready radial pulses bilaterally, extremities are cool Abd: soft, notender, nondistended Back: no ttp, no cva ttp, very large, cavitary, putrid smelling stage IV sacral decubitus ulcer, stage I changes noted over the ischial tuberosities bilaterally Skin: no rash, mildly diaphoretic, cool extremities Neuro: GCS 13, withdraws all ext to pain Psyche; flat affect, cooperative.   ED Course  Procedures (including critical care time)  Results for orders placed during the hospital encounter of 01/29/13 (from the past 24 hour(s))  LACTIC ACID, PLASMA     Status: None   Collection Time    01/29/13 12:15 AM      Result Value Range   Lactic Acid, Venous 1.6  0.5 - 2.2 mmol/L  LIPASE, BLOOD     Status: None   Collection Time    01/29/13 12:15 AM      Result Value Range   Lipase 41  11 - 59 U/L  PROTIME-INR     Status: Abnormal   Collection Time    01/29/13 12:15 AM      Result Value Range   Prothrombin Time 16.5 (*) 11.6 - 15.2 seconds   INR 1.37  0.00 - 1.49  COMPREHENSIVE METABOLIC PANEL     Status: Abnormal   Collection Time    01/29/13 12:15 AM      Result Value Range   Sodium 142  135 - 145 mEq/L   Potassium 2.2 (*) 3.5 - 5.1 mEq/L   Chloride 113 (*) 96 - 112 mEq/L   CO2 20  19 - 32 mEq/L   Glucose, Bld 80  70 - 99 mg/dL   BUN 25 (*) 6 - 23 mg/dL   Creatinine, Ser 7.82 (*) 0.50 - 1.35 mg/dL   Calcium 5.9 (*) 8.4 - 10.5 mg/dL   Total Protein 5.1 (*) 6.0 - 8.3 g/dL   Albumin 1.4 (*) 3.5 - 5.2 g/dL   AST 31  0 - 37 U/L   ALT 25  0 - 53 U/L   Alkaline Phosphatase 43  39 - 117 U/L   Total Bilirubin 0.2 (*) 0.3 - 1.2 mg/dL   GFR calc non Af Amer 26 (*) >90 mL/min   GFR calc Af Amer 30 (*) >90 mL/min  CBC WITH DIFFERENTIAL  Status: Abnormal   Collection Time    01/29/13 12:15 AM      Result Value Range   WBC 18.7 (*) 4.0 - 10.5  K/uL   RBC 2.31 (*) 4.22 - 5.81 MIL/uL   Hemoglobin 6.4 (*) 13.0 - 17.0 g/dL   HCT 21.3 (*) 08.6 - 57.8 %   MCV 83.1  78.0 - 100.0 fL   MCH 27.7  26.0 - 34.0 pg   MCHC 33.3  30.0 - 36.0 g/dL   RDW 46.9 (*) 62.9 - 52.8 %   Platelets 382  150 - 400 K/uL   Neutrophils Relative % 90 (*) 43 - 77 %   Neutro Abs 16.8 (*) 1.7 - 7.7 K/uL   Lymphocytes Relative 4 (*) 12 - 46 %   Lymphs Abs 0.7  0.7 - 4.0 K/uL   Monocytes Relative 6  3 - 12 %   Monocytes Absolute 1.1 (*) 0.1 - 1.0 K/uL   Eosinophils Relative 0  0 - 5 %   Eosinophils Absolute 0.1  0.0 - 0.7 K/uL   Basophils Relative 0  0 - 1 %   Basophils Absolute 0.1  0.0 - 0.1 K/uL  TYPE AND SCREEN     Status: None   Collection Time    01/29/13  1:05 AM      Result Value Range   ABO/RH(D) A POS     Antibody Screen NEG     Sample Expiration 02/01/2013     Unit Number [id number]     Blood Component Type RED CELLS,LR     Unit division 00     Status of Unit ISSUED     Transfusion Status OK TO TRANSFUSE     Crossmatch Result Compatible    PREPARE RBC (CROSSMATCH)     Status: None   Collection Time    01/29/13  1:08 AM      Result Value Range   Order Confirmation ORDER PROCESSED BY BLOOD BANK    POCT I-STAT TROPONIN I     Status: None   Collection Time    01/29/13  1:16 AM      Result Value Range   Troponin i, poc 0.01  0.00 - 0.08 ng/mL   Comment 3           POCT I-STAT, CHEM 8     Status: Abnormal   Collection Time    01/29/13  1:18 AM      Result Value Range   Sodium 145  135 - 145 mEq/L   Potassium 2.8 (*) 3.5 - 5.1 mEq/L   Chloride 112  96 - 112 mEq/L   BUN 23  6 - 23 mg/dL   Creatinine, Ser 3.66 (*) 0.50 - 1.35 mg/dL   Glucose, Bld 72  70 - 99 mg/dL   Calcium, Ion 4.40 (*) 1.13 - 1.30 mmol/L   TCO2 22  0 - 100 mmol/L   Hemoglobin 7.1 (*) 13.0 - 17.0 g/dL   HCT 34.7 (*) 42.5 - 95.6 %  CG4 I-STAT (LACTIC ACID)     Status: None   Collection Time    01/29/13  1:18 AM      Result Value Range   Lactic Acid, Venous 1.89   0.5 - 2.2 mmol/L  MAGNESIUM     Status: Abnormal   Collection Time    01/29/13  1:30 AM      Result Value Range   Magnesium 1.4 (*) 1.5 - 2.5 mg/dL  URINALYSIS, ROUTINE W REFLEX  MICROSCOPIC     Status: Abnormal   Collection Time    01/29/13  1:55 AM      Result Value Range   Color, Urine AMBER (*) YELLOW   APPearance CLOUDY (*) CLEAR   Specific Gravity, Urine 1.012  1.005 - 1.030   pH 7.5  5.0 - 8.0   Glucose, UA 100 (*) NEGATIVE mg/dL   Hgb urine dipstick LARGE (*) NEGATIVE   Bilirubin Urine NEGATIVE  NEGATIVE   Ketones, ur NEGATIVE  NEGATIVE mg/dL   Protein, ur 409 (*) NEGATIVE mg/dL   Urobilinogen, UA 0.2  0.0 - 1.0 mg/dL   Nitrite NEGATIVE  NEGATIVE   Leukocytes, UA TRACE (*) NEGATIVE  URINE MICROSCOPIC-ADD ON     Status: None   Collection Time    01/29/13  1:55 AM      Result Value Range   Squamous Epithelial / LPF RARE  RARE   WBC, UA 0-2  <3 WBC/hpf   RBC / HPF TOO NUMEROUS TO COUNT  <3 RBC/hpf   Bacteria, UA RARE  RARE  GLUCOSE, CAPILLARY     Status: Abnormal   Collection Time    01/29/13  2:17 AM      Result Value Range   Glucose-Capillary 124 (*) 70 - 99 mg/dL  POCT I-STAT 3, BLOOD GAS (G3P V)     Status: Abnormal   Collection Time    01/29/13  2:35 AM      Result Value Range   pH, Ven 7.388 (*) 7.250 - 7.300   pCO2, Ven 48.5  45.0 - 50.0 mmHg   pO2, Ven 26.0 (*) 30.0 - 45.0 mmHg   Bicarbonate 29.2 (*) 20.0 - 24.0 mEq/L   TCO2 31  0 - 100 mmol/L   O2 Saturation 47.0     Acid-Base Excess 3.0 (*) 0.0 - 2.0 mmol/L   Sample type VENOUS     Comment NOTIFIED PHYSICIAN     EKG: sinus tach, no acute ischemic changes, normal intervals, normal axis, normal qrs complex  18g angiocath placed in left EJ by me.   MDM   Patient with septic shock. We are resuscitating with IVF and treating empirically with Vanc, gent, zosyn. Labs pending. Will tx with supportive 02. Code status confirmed with wife and daughter as DNR. Patient placed on contact isolation in light of  recent c.diff infection.  Patient will be admitted to either ICU or step down unit.   Patient with profound hypocalcemia and hypokalemia. We are treating with IV Ca and KCl. We are transfusing with PRBCs for Hgb of 6. I had a very lengthy (approx 5m total) discussion with the patient's wife, dtr and son regarding agressiveness of care and end of life issues. They do not wish the patient to  Have vasopressors and are moving toward comfort measures only.    I have relayed this information to Dr. Toniann Fail who will see and admit. Will place temp admission orders for SDU.   CRITICAL CARE Performed by: Brandt Loosen   Total critical care time: 6  Critical care time was exclusive of separately billable procedures and treating other patients.  Critical care was necessary to treat or prevent imminent or life-threatening deterioration.  Critical care was time spent personally by me on the following activities: development of treatment plan with patient and/or surrogate as well as nursing, discussions with consultants, evaluation of patient's response to treatment, examination of patient, obtaining history from patient or surrogate, ordering and performing treatments and interventions, ordering  and review of laboratory studies, ordering and review of radiographic studies, pulse oximetry and re-evaluation of patient's condition.        Brandt Loosen, MD 01/29/13 [phone]

## 2013-01-29 NOTE — ED Notes (Signed)
Per EMS: Pt from [facility] with c/o hypotension of 60/40 per nursing home. On EMS arrival, 80/42.  Pt had dialysis today. 128 ST. 95% RA. Hot to touch. Pt nonverbal at baseline with hx dementia,ESRD, diabetes.

## 2013-01-29 NOTE — ED Notes (Signed)
emt- inserted foley/temp and patient tolerated well, without any complaints.

## 2013-01-29 NOTE — ED Notes (Signed)
Pt. Only to be given 20 meq of potassium chloride per Dr. Toniann Fail

## 2013-01-29 NOTE — H&P (Signed)
[facility] History and Physical  [name] YNW:[id number] DOB: 01-25-1931 DOA: 01/29/2013  Referring physician: ER physician. PCP: Patrick Givens, MD  Specialists: Nephrologist for dialysis.  Chief Complaint: Hypotension.  HPI: [name] is a 79 y.o. male history of ESRD on hemodialysis who was recently admitted for sepsis secondary to C. difficile colitis was brought to the ER from nursing home after patient was found to be hypotensive. On arrival patient also was found to be febrile tachycardic and hemoglobin low from his baseline. Patient was started on empiric antibiotics after blood culture and at this time UA still pending. Patient's sacral decubitus also had discharge on exam as per the ER physician. At this time patient sepsis is possibly secondary to infected decubitus ulcer. Patient was found to be hypotensive and was given 1 L normal saline bolus and has been also started on empiric antibiotics along with PRBC transfusion. At this time patient's family had a detailed discussion with ER physician Dr. Arnoldo Morale and they have requested to continue with DO NOT RESUSCITATE status and no vasopressors. They are moving towards comfort measures.  Review of Systems: As presented in the history of presenting illness, rest negative.  Past Medical History  Diagnosis Date  . Hypertension   . Diabetes mellitus     typeII / No meds  . Cataract 01/2002    Right  . Dialysis patient 10/13/2007  . Radiculopathy 05/14/2006    NCV study negative carpal tunnel, Left C5 radiculopathy  . Arthritis     Knee pain  . ESRD (end stage renal disease) on dialysis 2009    [facility] diaylsis Monday , Wed. and Friday per Dr. Kathrene Bongo  . Stroke   . Elevated PSA     h/o, pt had declined further eval.   . Syncope 11/02/2012   Past Surgical History  Procedure Laterality Date  . Varicose vein surgery  1978  . External fixation wrist fracture  1990  . Thyroidectomy, partial   08/03/2003    Right thyroid lobectomy, subtotal parathyr. sec hyperparthyroidismadenosis  . Av fistula placement  07/2004    Left arm AV fistula placement [name] via [place])  . Eye surgery      Cataract   Social History:  reports that he quit smoking about 10 years ago. His smoking use included Cigarettes. He smoked 0.00 packs per day for 10 years. He has never used smokeless tobacco. He reports that he does not drink alcohol or use illicit drugs. Nursing home.  where does patient live-- Cannot do ADLs.  Can patient participate in ADLs?  No Known Allergies  Family History  Problem Relation Age of Onset  . Cancer Father 9    Lung Cancer//? stomach cancer  . Cancer Sister 84    brain tumor      Prior to Admission medications   Medication Sig Start Date End Date Taking? Authorizing Provider  cinacalcet (SENSIPAR) 30 MG tablet Take 30 mg by mouth 2 (two) times daily.    Yes Historical Provider, MD  dipyridamole-aspirin (AGGRENOX) 200-25 MG per 12 hr capsule Take 1 capsule by mouth 2 (two) times daily. 09/19/11  Yes Joaquim Nam, MD  gabapentin (NEURONTIN) 100 MG capsule Take 100 mg by mouth daily. 11/08/12  Yes Vassie Loll, MD  lanthanum (FOSRENOL) 500 MG chewable tablet Chew 1 tablet (500 mg total) by mouth 3 (three) times daily with meals. 01/10/13  Yes Christiane Ha, MD  multivitamin (RENA-VIT) TABS tablet Take 1 tablet by mouth daily.  Yes Historical Provider, MD  Nutritional Supplements (FEEDING SUPPLEMENT, NEPRO CARB STEADY,) LIQD Take 237 mLs by mouth 2 (two) times daily between meals. 11/08/12  Yes Vassie Loll, MD  Omega-3 Fatty Acids (FISH OIL) 1000 MG CAPS Take 1 capsule by mouth daily.     Yes Historical Provider, MD  oxyCODONE (OXY IR/ROXICODONE) 5 MG immediate release tablet Take 1 tablet (5 mg total) by mouth every 6 (six) hours as needed for pain. 01/26/13  Yes Oneal Grout, MD   Physical Exam: Filed Vitals:   01/29/13 0300 01/29/13 0315 01/29/13 0330 01/29/13  0345  BP: 74/43 85/49 80/48  85/49  Pulse: 112 111 116 127  Temp: 99.1 F (37.3 C) 99 F (37.2 C) 98.8 F (37.1 C) 98.8 F (37.1 C)  TempSrc:      Resp: 27 26 24 24   SpO2:   100%      General:  Well-developed and nourished.   Eyes: Anicteric no pallor.   ENT: No discharge from ears eyes nose mouth.   Neck:No mass felt.   Cardiovascular: S1-S2 heard tachycardic.   Respiratory: No rhonchi or crepitations.   Abdomen: Soft nontender bowel sounds present.   Skin: Large stage IV decubitus ulcer in the sacral area.   Musculoskeletal: No edema.   Psychiatric: Oriented to his name.   Neurologic: Oriented to his name.  Labs on Admission:  Basic Metabolic Panel:  Recent Labs Lab 01/29/13 0015 01/29/13 0118 01/29/13 0130  NA 142 145  --   K 2.2* 2.8*  --   CL 113* 112  --   CO2 20  --   --   GLUCOSE 80 72  --   BUN 25* 23  --   CREATININE 2.24* 2.70*  --   CALCIUM 5.9*  --   --   MG  --   --  1.4*   Liver Function Tests:  Recent Labs Lab 01/29/13 0015  AST 31  ALT 25  ALKPHOS 43  BILITOT 0.2*  PROT 5.1*  ALBUMIN 1.4*    Recent Labs Lab 01/29/13 0015  LIPASE 41   No results found for this basename: AMMONIA,  in the last 168 hours CBC:  Recent Labs Lab 01/29/13 0015 01/29/13 0118  WBC 18.7*  --   NEUTROABS 16.8*  --   HGB 6.4* 7.1*  HCT 19.2* 21.0*  MCV 83.1  --   PLT 382  --    Cardiac Enzymes: No results found for this basename: CKTOTAL, CKMB, CKMBINDEX, TROPONINI,  in the last 168 hours  BNP (last 3 results) No results found for this basename: PROBNP,  in the last 8760 hours CBG:  Recent Labs Lab 01/29/13 0217  GLUCAP 124*    Radiological Exams on Admission: Dg Chest Port 1 View  01/29/2013   *RADIOLOGY REPORT*  Clinical Data: Sepsis, hypotension, unresponsive, history hypertension, diabetes, end-stage renal disease on dialysis  PORTABLE CHEST - 1 VIEW  Comparison: Portable exam 0055 hours compared to 12/27/2012  Findings: Upper  normal heart size. Atherosclerotic calcification aorta. Stable mediastinal contours and pulmonary vascularity. Bronchitic changes with minimal right basilar atelectasis. No definite acute infiltrate, pleural effusion or pneumothorax. Bilateral chronic rotator cuff tears and AC joint degenerative changes. Scattered endplate spur formation thoracic spine.  IMPRESSION: Bronchitic changes with minimal right basilar atelectasis.   Original Report Authenticated By: Ulyses Southward, M.D.     Assessment/Plan Principal Problem:   Sepsis Active Problems:   End stage renal disease   Anemia of chronic disease  Decubitus ulcer of sacral region, stage 4   1. Developing sepsis possible source could be infected decubitus ulcer in the sacral area - UA is pending. Blood cultures obtained. Patient has been placed on vancomycin and cefepime. Wound team consult requested. 2. Anemia - one unit of packed blood cell transfusion has been ordered. Closely follow CBC. 3. ESRD on hemodialysis - Monday Wednesday and Friday. Consult nephrology for dialysis. 4. History of CVA and vascular dementia - due to the worsening anemia at this time holding off Aggrenox. Based on patient's response to transfusion consider restarting Aggrenox.  At this time patient's family has requested to continue patient's DO NOT RESUSCITATE status and no vasopressors. Patient's family is moving towards comfort measures.    Code Status:DO NOT RESUSCITATE.  Family Communication: ER physician had a detailed discussion with patient's family.  Disposition Plan: Admit to inpatient.    Alazia Crocket N. [facility] Pager [phone].  If 7PM-7AM, please contact night-coverage [email or web address] Password [email or web address] 01/29/2013, 4:00 AM

## 2013-01-29 NOTE — Progress Notes (Addendum)
INITIAL NUTRITION ASSESSMENT  DOCUMENTATION CODES Per approved criteria  -Severe malnutrition in the context of chronic illness   INTERVENTION:  Continue Nepro Carb Steady twice daily (425 kcals, 19.1 gm protein per 8 fl oz bottle) RD to follow for nutrition care plan  NUTRITION DIAGNOSIS: Increased nutrient needs related to HD, wound healing as evidenced by estimated nutrition needs  Goal: Oral intake with meals & supplements to meet >/= 90% of estimated nutrition needs  Monitor:  PO & supplemental intake, weight, labs, I/O's  Reason for Assessment: Malnutrition Screening Tool Report, Low Braden  79 y.o. male  Admitting Dx: Sepsis  ASSESSMENT: Patient with ESRD on hemodialysis who was recently admitted for sepsis secondary to C. difficile colitis was brought to the ER from nursing home after patient was found to be hypotensive.   Patient reports he's eating ok; ate 75% of breakfast this AM; noted Stage IV pressure ulcer present to sacrum; at risk for further skin breakdown given low braden score; has Nepro Carb Steady supplement & RENA-VIT ordered; he does enjoy drinking Nepro; s/p HD session 6/20.  Patient meets criteria for severe malnutrition in the context of chronic illness as evidenced by 17% wt loss in < 6 months and intake of < 75% x at least 1 month.  Height: Ht Readings from Last 1 Encounters:  01/29/13 6\' 6"  (1.981 m)    Weight ---> question accuracy Wt Readings from Last 1 Encounters:  01/29/13 183 lb 6.8 oz (83.2 kg)    Ideal Body Weight: 202 lb  % Ideal Body Weight: 90%  Wt Readings from Last 10 Encounters:  01/29/13 183 lb 6.8 oz (83.2 kg)  01/14/13 169 lb (76.658 kg)  01/09/13 169 lb 5 oz (76.8 kg)  12/21/12 194 lb (87.998 kg)  11/08/12 194 lb 3.6 oz (88.1 kg)  08/31/12 204 lb (92.534 kg)  04/29/12 209 lb (94.802 kg)  04/09/12 215 lb (97.523 kg)  04/09/12 215 lb (97.523 kg)  04/01/12 215 lb (97.523 kg)    Usual Body Weight: 204 lb  %  Usual Body Weight: 82% (used 169 lb weight)  BMI:  Body mass index is 21.2 kg/(m^2).  Estimated Nutritional Needs: Kcal: 2100-2300 Protein: 120-135 gm Fluid: 1200 ml  Skin: Stage IV sacral pressure ulcer  Diet Order: Renal 60/70-09-12-1198 ml  EDUCATION NEEDS: -No education needs identified at this time   Intake/Output Summary (Last 24 hours) at 01/29/13 1341 Last data filed at 01/29/13 0858  Gross per 24 hour  Intake  802.5 ml  Output    214 ml  Net  588.5 ml    Labs:   Recent Labs Lab 01/29/13 0015 01/29/13 0118 01/29/13 0130 01/29/13 0848  NA 142 145  --  137  K 2.2* 2.8*  --  4.2  CL 113* 112  --  99  CO2 20  --   --  27  BUN 25* 23  --  38*  CREATININE 2.24* 2.70*  --  3.67*  CALCIUM 5.9*  --   --  9.1  MG  --   --  1.4*  --   GLUCOSE 80 72  --  134*    CBG (last 3)   Recent Labs  01/29/13 0217  GLUCAP 124*    Scheduled Meds: . [START ON 01/31/2013] ceFEPime (MAXIPIME) IV  2 g Intravenous Q M,W,F-2000  . cinacalcet  30 mg Oral BID WC  . [START ON 01/31/2013] darbepoetin (ARANESP) injection - DIALYSIS  60 mcg Intravenous Q Mon-HD  .  feeding supplement (NEPRO CARB STEADY)  237 mL Oral BID BM  . gabapentin  100 mg Oral Daily  . lanthanum  500 mg Oral TID WC  . multivitamin  1 tablet Oral Daily  . omega-3 acid ethyl esters  1 g Oral Daily  . potassium chloride  30 mEq Oral Once  . sodium chloride  3 mL Intravenous Q12H  . sodium chloride  3 mL Intravenous Q12H  . [START ON 01/31/2013] vancomycin  750 mg Intravenous Q M,W,F-HD    Continuous Infusions:   Past Medical History  Diagnosis Date  . Hypertension   . Diabetes mellitus     typeII / No meds  . Cataract 01/2002    Right  . Dialysis patient 10/13/2007  . Radiculopathy 05/14/2006    NCV study negative carpal tunnel, Left C5 radiculopathy  . Arthritis     Knee pain  . ESRD (end stage renal disease) on dialysis 2009    [facility] diaylsis Monday , Wed. and Friday per Dr.  Kathrene Bongo  . Stroke   . Elevated PSA     h/o, pt had declined further eval.   . Syncope 11/02/2012    Past Surgical History  Procedure Laterality Date  . Varicose vein surgery  1978  . External fixation wrist fracture  1990  . Thyroidectomy, partial  08/03/2003    Right thyroid lobectomy, subtotal parathyr. sec hyperparthyroidismadenosis  . Av fistula placement  07/2004    Left arm AV fistula placement [name] via [place])  . Eye surgery      Cataract    Maureen Chatters, RD, LDN Pager #: [phone] After-Hours Pager #: [phone]

## 2013-01-29 NOTE — Consult Note (Addendum)
[name] 01/29/2013 [name] D Requesting Physician:  Dr Toniann Fail  Reason for Consult:  ESRD pt admitted with fever and AMS HPI: The patient is a 79 y.o. year-old with hx of DM and HTN, ESRD on HD and recent admission here for stage IV sacral decubitus ulcer.  Was d/c'd back to [facility] after Rx with antibiotics. He was made DNR. Pt is bedbound.  He returned to ED last pm with AMS , BP 60/40 at SNF. ED team dx'd septic shock and he was empirically Rx'd with IVF's , vanc, gent and zosyn.  DNR was reconfirmed with family and pt was admitted to 5500. [facility] MD admitted pt and continued pt on vanc and cefipime.  According to family, pt is much more alert and interactive today.    Pt is w/o complaints.     ROS  denies cp, sob or cough  denies fever  no n/v/d  jt pain or skin rash    Past Medical History  Past Medical History  Diagnosis Date  . Hypertension   . Diabetes mellitus     typeII / No meds  . Cataract 01/2002    Right  . Dialysis patient 10/13/2007  . Radiculopathy 05/14/2006    NCV study negative carpal tunnel, Left C5 radiculopathy  . Arthritis     Knee pain  . ESRD (end stage renal disease) on dialysis 2009    [facility] diaylsis Monday , Wed. and Friday per Dr. Kathrene Bongo  . Stroke   . Elevated PSA     h/o, pt had declined further eval.   . Syncope 11/02/2012    Past Surgical History  Past Surgical History  Procedure Laterality Date  . Varicose vein surgery  1978  . External fixation wrist fracture  1990  . Thyroidectomy, partial  08/03/2003    Right thyroid lobectomy, subtotal parathyr. sec hyperparthyroidismadenosis  . Av fistula placement  07/2004    Left arm AV fistula placement [name] via Gail)  . Eye surgery      Cataract    Family History  Family History  Problem Relation Age of Onset  . Cancer Father 68    Lung Cancer//? stomach cancer  . Cancer Sister 38    brain tumor   Social History  reports that he quit  smoking about 10 years ago. His smoking use included Cigarettes. He smoked 0.00 packs per day for 10 years. He has never used smokeless tobacco. He reports that he does not drink alcohol or use illicit drugs.  Allergies No Known Allergies  Home medications Prior to Admission medications   Medication Sig Start Date End Date Taking? Authorizing Provider  cinacalcet (SENSIPAR) 30 MG tablet Take 30 mg by mouth 2 (two) times daily.    Yes Historical Provider, MD  dipyridamole-aspirin (AGGRENOX) 200-25 MG per 12 hr capsule Take 1 capsule by mouth 2 (two) times daily. 09/19/11  Yes Joaquim Nam, MD  gabapentin (NEURONTIN) 100 MG capsule Take 100 mg by mouth daily. 11/08/12  Yes Vassie Loll, MD  lanthanum (FOSRENOL) 500 MG chewable tablet Chew 1 tablet (500 mg total) by mouth 3 (three) times daily with meals. 01/10/13  Yes Christiane Ha, MD  multivitamin (RENA-VIT) TABS tablet Take 1 tablet by mouth daily.     Yes Historical Provider, MD  Nutritional Supplements (FEEDING SUPPLEMENT, NEPRO CARB STEADY,) LIQD Take 237 mLs by mouth 2 (two) times daily between meals. 11/08/12  Yes Vassie Loll, MD  Omega-3 Fatty Acids (FISH OIL)  1000 MG CAPS Take 1 capsule by mouth daily.     Yes Historical Provider, MD  oxyCODONE (OXY IR/ROXICODONE) 5 MG immediate release tablet Take 1 tablet (5 mg total) by mouth every 6 (six) hours as needed for pain. 01/26/13  Yes Oneal Grout, MD    LABS Liver Function Tests  Recent Labs Lab 01/29/13 0015  AST 31  ALT 25  ALKPHOS 43  BILITOT 0.2*  PROT 5.1*  ALBUMIN 1.4*    Recent Labs Lab 01/29/13 0015  LIPASE 41   CBC  Recent Labs Lab 01/29/13 0015 01/29/13 0118  WBC 18.7*  --   NEUTROABS 16.8*  --   HGB 6.4* 7.1*  HCT 19.2* 21.0*  MCV 83.1  --   PLT 382  --    Basic Metabolic Panel  Recent Labs Lab 01/29/13 0015 01/29/13 0118  NA 142 145  K 2.2* 2.8*  CL 113* 112  CO2 20  --   GLUCOSE 80 72  BUN 25* 23  CREATININE 2.24* 2.70*  CALCIUM  5.9*  --     Physical Exam:  Blood pressure 90/56, pulse 101, temperature 97.6 F (36.4 C), temperature source Oral, resp. rate 17, height 6\' 6"  (1.981 m), weight 83.2 kg (183 lb 6.8 oz), SpO2 98.00%. Gen: frail elderly AAM in no distress HEENT:  EOMI, sclera anicteric, throat is clear Neck: no JVD, no LAN Chest: clear bilat, no rales or rhonchi CV: regular, no rub or gallop, pedal pulses intact Abdomen: soft, nontender, scaphoid, no HSM or ascites Ext: no LE or UE edema, no joint effusion or deformity, no gangrene or ulceration Neuro: alert, oriented to place and not year Access: LUA AVF+bruit  Outpatient HD: MWF [facility]  4hrs  F180   500/A1.5   EDW 77.5kg   2K/2Ca Bath   LUA AVF   EPO 6200   Heparin 5000   Impression/Plan 1. Septic shock / fever / ^WBC- suspected infected decub. Looking better on IV abx. Family considering comfort care, they are actively discussing this matter according to family i spoke with today.   2. ESRD, cont HD MWF.  Had HD yesterday, no need for HD today.  3. HTN/volume- up 5kg by weight, no resp compromise. Observe w/e, UF as needed on HD Monday 4. Anemia of CKD- Hb low in 6-7.5 range, prbc x 1 ordered (?given), will cont ESA darbepoetin IV w next HD (in place of EPO) 5. Secondary HPTH- not on vit D, cont sensipar and lanthanum 6. Stage IV sacral decub 7. Hypokalemia-  Got 2runs IV KCL, give  30 po x 1 also 8. DNR   [name]  MD [facility] [phone] pgr    [phone] cell 01/29/2013, 9:57 AM

## 2013-01-29 NOTE — Progress Notes (Signed)
[facility] Progress Note [place] TEAM 1 - Stepdown/ICU TEAM   Keland Peyton UJW:[id number] DOB: [birth year] DOA: 01/29/2013 PCP: Crawford Givens, MD  Brief narrative: [name] is a 79 y.o. male history of ESRD on hemodialysis who was recently admitted for sepsis secondary to C. difficile colitis was brought to the ER from nursing home after patient was found to be hypotensive. On arrival patient also was found to be febrile tachycardic and hemoglobin low from his baseline. Patient was started on empiric antibiotics after blood culture and at this time UA still pending. Patient's sacral decubitus also had discharge on exam as per the ER physician. At this time patient sepsis is possibly secondary to infected decubitus ulcer. Patient was found to be hypotensive and was given 1 L normal saline bolus and has been also started on empiric antibiotics along with PRBC transfusion. At this time patient's family had a detailed discussion with ER physician Dr. Arnoldo Morale and they have requested to continue with DO NOT RESUSCITATE status and no vasopressors. They are moving towards comfort measures.   Assessment/Plan: Principal Problem:   Sepsis/ leukocytosis Cont current antibiotics- vanc and cefepime Suspected source is stage 4 sacra decubitus  Active Problems:   End stage renal disease - cont dialysis per nephro    Anemia of chronic disease -stable    Decubitus ulcer of sacral region, stage 4 - wound care consult requested    Protein-calorie malnutrition, severe - nutritional supplements    Code Status: DNR Family Communication: none Disposition Plan: follow in SDU  Consultants: Nephro  Procedures: none  Antibiotics: Vanc/ Cefepime 6/12  DVT prophylaxis: SCD  HPI/Subjective: Pt alert and oriented, laying in bed, has no complaints-    Objective: Blood pressure 90/56, pulse 101, temperature 97.8 F (36.6 C), temperature source Oral, resp. rate 17, height 6\' 6"  (1.981  m), weight 83.2 kg (183 lb 6.8 oz), SpO2 98.00%.  Intake/Output Summary (Last 24 hours) at 01/29/13 1558 Last data filed at 01/29/13 0858  Gross per 24 hour  Intake  802.5 ml  Output    214 ml  Net  588.5 ml     Exam: General: No acute respiratory distress Lungs: Clear to auscultation bilaterally without wheezes or crackles Cardiovascular: Regular rate and rhythm without murmur gallop or rub normal S1 and S2 Abdomen: Nontender, nondistended, soft, bowel sounds positive, no rebound, no ascites, no appreciable mass Extremities: No significant cyanosis, clubbing, or edema bilateral lower extremities Skin- large stage 4 decubitus ulcer with significant amount of necrotic tissue  Data Reviewed: Basic Metabolic Panel:  Recent Labs Lab 01/29/13 0015 01/29/13 0118 01/29/13 0130 01/29/13 0848  NA 142 145  --  137  K 2.2* 2.8*  --  4.2  CL 113* 112  --  99  CO2 20  --   --  27  GLUCOSE 80 72  --  134*  BUN 25* 23  --  38*  CREATININE 2.24* 2.70*  --  3.67*  CALCIUM 5.9*  --   --  9.1  MG  --   --  1.4*  --    Liver Function Tests:  Recent Labs Lab 01/29/13 0015 01/29/13 0848  AST 31 45*  ALT 25 35  ALKPHOS 43 71  BILITOT 0.2* 0.4  PROT 5.1* 7.4  ALBUMIN 1.4* 2.0*    Recent Labs Lab 01/29/13 0015  LIPASE 41   No results found for this basename: AMMONIA,  in the last 168 hours CBC:  Recent Labs Lab 01/29/13 0015  01/29/13 0118 01/29/13 0848  WBC 18.7*  --  25.7*  NEUTROABS 16.8*  --  21.2*  HGB 6.4* 7.1* 10.1*  HCT 19.2* 21.0* 31.4*  MCV 83.1  --  84.4  PLT 382  --  460*   Cardiac Enzymes: No results found for this basename: CKTOTAL, CKMB, CKMBINDEX, TROPONINI,  in the last 168 hours BNP (last 3 results) No results found for this basename: PROBNP,  in the last 8760 hours CBG:  Recent Labs Lab 01/29/13 0217  GLUCAP 124*    Recent Results (from the past 240 hour(s))  MRSA PCR SCREENING     Status: None   Collection Time    01/29/13  5:30 AM       Result Value Range Status   MRSA by PCR NEGATIVE  NEGATIVE Final   Comment:            The GeneXpert MRSA Assay (FDA     approved for NASAL specimens     only), is one component of a     comprehensive MRSA colonization     surveillance program. It is not     intended to diagnose MRSA     infection nor to guide or     monitor treatment for     MRSA infections.     Studies:  Recent x-ray studies have been reviewed in detail by the Attending Physician  Scheduled Meds:  Scheduled Meds: . [START ON 01/31/2013] ceFEPime (MAXIPIME) IV  2 g Intravenous Q M,W,F-2000  . cinacalcet  30 mg Oral BID WC  . [START ON 01/31/2013] darbepoetin (ARANESP) injection - DIALYSIS  60 mcg Intravenous Q Mon-HD  . feeding supplement (NEPRO CARB STEADY)  237 mL Oral BID BM  . gabapentin  100 mg Oral Daily  . lanthanum  500 mg Oral TID WC  . multivitamin  1 tablet Oral Daily  . omega-3 acid ethyl esters  1 g Oral Daily  . potassium chloride  30 mEq Oral Once  . sodium chloride  3 mL Intravenous Q12H  . sodium chloride  3 mL Intravenous Q12H  . [START ON 01/31/2013] vancomycin  750 mg Intravenous Q M,W,F-HD   Continuous Infusions:   Time spent on care of this patient: 35 min   Calvert Cantor, MD [phone]  [facility] Office  [phone] Pager - Text Page per Amion as per below:  On-Call/Text Page:      [email or web address]      password [email or web address]  If 7PM-7AM, please contact night-coverage [email or web address] Password [facility] 01/29/2013, 3:58 PM   LOS: 0 days

## 2013-01-29 NOTE — Progress Notes (Signed)
ANTIBIOTIC CONSULT NOTE - INITIAL  Pharmacy Consult for Vancocin and Maxipime Indication: rule out sepsis  No Known Allergies  Patient Measurements: Weight: ~75kg  Vital Signs: Temp: 98.2 F (36.8 C) (06/21 0500) Temp src: Core (Comment) (06/21 0230) BP: 80/49 mmHg (06/21 0500) Pulse Rate: 107 (06/21 0445) Intake/Output from previous day: 06/20 0701 - 06/21 0700 In: 362.5 [Blood:362.5] Out: 200 [Urine:200] Intake/Output from this shift: Total I/O In: 362.5 [Blood:362.5] Out: 200 [Urine:200]  Labs:  Recent Labs  01/29/13 0015 01/29/13 0118  WBC 18.7*  --   HGB 6.4* 7.1*  PLT 382  --   CREATININE 2.24* 2.70*    Microbiology: No results found for this or any previous visit (from the past 720 hour(s)).  Medical History: Past Medical History  Diagnosis Date  . Hypertension   . Diabetes mellitus     typeII / No meds  . Cataract 01/2002    Right  . Dialysis patient 10/13/2007  . Radiculopathy 05/14/2006    NCV study negative carpal tunnel, Left C5 radiculopathy  . Arthritis     Knee pain  . ESRD (end stage renal disease) on dialysis 2009    [facility] diaylsis Monday , Wed. and Friday per Dr. Kathrene Bongo  . Stroke   . Elevated PSA     h/o, pt had declined further eval.   . Syncope 11/02/2012    Medications:  Prescriptions prior to admission  Medication Sig Dispense Refill  . cinacalcet (SENSIPAR) 30 MG tablet Take 30 mg by mouth 2 (two) times daily.       [name] Kitchen dipyridamole-aspirin (AGGRENOX) 200-25 MG per 12 hr capsule Take 1 capsule by mouth 2 (two) times daily.      [name] Kitchen gabapentin (NEURONTIN) 100 MG capsule Take 100 mg by mouth daily.      [name] Kitchen lanthanum (FOSRENOL) 500 MG chewable tablet Chew 1 tablet (500 mg total) by mouth 3 (three) times daily with meals.      . multivitamin (RENA-VIT) TABS tablet Take 1 tablet by mouth daily.        . Nutritional Supplements (FEEDING SUPPLEMENT, NEPRO CARB STEADY,) LIQD Take 237 mLs by mouth 2 (two) times daily  between meals.      . Omega-3 Fatty Acids (FISH OIL) 1000 MG CAPS Take 1 capsule by mouth daily.        [name] Kitchen oxyCODONE (OXY IR/ROXICODONE) 5 MG immediate release tablet Take 1 tablet (5 mg total) by mouth every 6 (six) hours as needed for pain.  120 tablet  0   Scheduled:  . ceFEPime (MAXIPIME) IV  2 g Intravenous Once  . [START ON 01/31/2013] ceFEPime (MAXIPIME) IV  2 g Intravenous Q M,W,F-2000  . cinacalcet  30 mg Oral BID WC  . feeding supplement (NEPRO CARB STEADY)  237 mL Oral BID BM  . gabapentin  100 mg Oral Daily  . lanthanum  500 mg Oral TID WC  . multivitamin  1 tablet Oral Daily  . omega-3 acid ethyl esters  1 g Oral Daily  . sodium chloride  3 mL Intravenous Q12H  . sodium chloride  3 mL Intravenous Q12H  . vancomycin  750 mg Intravenous Once  . [START ON 01/31/2013] vancomycin  750 mg Intravenous Q M,W,F-HD     Assessment: 79yo male had recently been admitted for sepsis 2/2 Cdiff colitis, now brought in from NH for hypotension, also found to be febrile, tachycardic, and anemic from baseline, concern for sepsis 2/2 infected decubitus ulcer, to begin IV  ABX.  Goal of Therapy:  Pre-HD vanc level 15-25  Plan:  Rec'd vanc 1g in ED; will give additional vancomycin 750mg  x1 to complete load of 1750mg  then begin 750mg  IV after each HD as well as Maxipime 2g now then after each HD and monitor CBC, Cx, levels prn.  Vernard Gambles, PharmD, BCPS  01/29/2013,5:46 AM

## 2013-01-30 DIAGNOSIS — D638 Anemia in other chronic diseases classified elsewhere: Secondary | ICD-10-CM

## 2013-01-30 LAB — URINE CULTURE: Culture: NO GROWTH

## 2013-01-30 LAB — BASIC METABOLIC PANEL
BUN: 52 mg/dL — ABNORMAL HIGH (ref 6–23)
CO2: 25 mEq/L (ref 19–32)
Calcium: 8.9 mg/dL (ref 8.4–10.5)
Chloride: 95 mEq/L — ABNORMAL LOW (ref 96–112)
Creatinine, Ser: 5.04 mg/dL — ABNORMAL HIGH (ref 0.50–1.35)
GFR calc Af Amer: 11 mL/min — ABNORMAL LOW (ref >90)

## 2013-01-30 LAB — TYPE AND SCREEN

## 2013-01-30 LAB — SEDIMENTATION RATE: Sed Rate: 74 mm/hr — ABNORMAL HIGH (ref 0–16)

## 2013-01-30 NOTE — Progress Notes (Signed)
[facility] Progress Note [place] TEAM 1 - Stepdown/ICU TEAM   [name] ZOX:[id number] DOB: 1931-07-28 DOA: 01/29/2013 PCP: Crawford Givens, MD  Brief narrative: [name] is a 79 y.o. male history of ESRD on hemodialysis who was recently admitted for sepsis secondary to C. difficile colitis was brought to the ER from nursing home after patient was found to be hypotensive. On arrival patient also was found to be febrile tachycardic and hemoglobin low from his baseline. Patient was started on empiric antibiotics after blood culture and at this time UA still pending. Patient's sacral decubitus also had discharge on exam as per the ER physician. At this time patient sepsis is possibly secondary to infected decubitus ulcer. Patient was found to be hypotensive and was given 1 L normal saline bolus and has been also started on empiric antibiotics along with PRBC transfusion. At this time patient's family had a detailed discussion with ER physician Dr. Arnoldo Morale and they have requested to continue with DO NOT RESUSCITATE status and no vasopressors. They are moving towards comfort measures.   Assessment/Plan: Principal Problem:   Sepsis/ leukocytosis - Cont current antibiotics- vanc and cefepime - Suspected source is sacra decubitus - still hypotensive but fevers resolved and blood cultures still negative - Leukocytosis not yet improving- wound still needs debridement with has not yet been preformed- will also check ESR sacral osteo- may need further Xray or MRI to properly assess  Active Problems:   End stage renal disease - cont dialysis per nephro    Anemia of chronic disease -stable    Decubitus ulcer of sacral region, stage 4 - wound care consult requested    Protein-calorie malnutrition, severe - nutritional supplements    Code Status: DNR Family Communication: none Disposition Plan: follow in SDU due to continued  hypotension  Consultants: Nephro  Procedures: none  Antibiotics: Vanc/ Cefepime 6/12  DVT prophylaxis: SCD  HPI/Subjective: Pt alert and oriented, laying in bed, has no complaints-    Objective: Blood pressure 83/41, pulse 94, temperature 97.6 F (36.4 C), temperature source Axillary, resp. rate 22, height 6\' 6"  (1.981 m), weight 85 kg (187 lb 6.3 oz), SpO2 96.00%.  Intake/Output Summary (Last 24 hours) at 01/30/13 1146 Last data filed at 01/30/13 1100  Gross per 24 hour  Intake   1020 ml  Output      0 ml  Net   1020 ml     Exam: General: No acute respiratory distress Lungs: Clear to auscultation bilaterally without wheezes or crackles Cardiovascular: Regular rate and rhythm without murmur gallop or rub normal S1 and S2 Abdomen: Nontender, nondistended, soft, bowel sounds positive, no rebound, no ascites, no appreciable mass Extremities: No significant cyanosis, clubbing, or edema bilateral lower extremities Skin- large stage 4 decubitus ulcer with significant amount of necrotic tissue  Data Reviewed: Basic Metabolic Panel:  Recent Labs Lab 01/29/13 0015 01/29/13 0118 01/29/13 0130 01/29/13 0848 01/30/13 0830  NA 142 145  --  137 132*  K 2.2* 2.8*  --  4.2 4.5  CL 113* 112  --  99 95*  CO2 20  --   --  27 25  GLUCOSE 80 72  --  134* 118*  BUN 25* 23  --  38* 52*  CREATININE 2.24* 2.70*  --  3.67* 5.04*  CALCIUM 5.9*  --   --  9.1 8.9  MG  --   --  1.4*  --   --    Liver Function Tests:  Recent Labs  Lab 01/29/13 0015 01/29/13 0848  AST 31 45*  ALT 25 35  ALKPHOS 43 71  BILITOT 0.2* 0.4  PROT 5.1* 7.4  ALBUMIN 1.4* 2.0*    Recent Labs Lab 01/29/13 0015  LIPASE 41   No results found for this basename: AMMONIA,  in the last 168 hours CBC:  Recent Labs Lab 01/29/13 0015 01/29/13 0118 01/29/13 0848  WBC 18.7*  --  25.7*  NEUTROABS 16.8*  --  21.2*  HGB 6.4* 7.1* 10.1*  HCT 19.2* 21.0* 31.4*  MCV 83.1  --  84.4  PLT 382  --  460*    Cardiac Enzymes: No results found for this basename: CKTOTAL, CKMB, CKMBINDEX, TROPONINI,  in the last 168 hours BNP (last 3 results) No results found for this basename: PROBNP,  in the last 8760 hours CBG:  Recent Labs Lab 01/29/13 0217  GLUCAP 124*    Recent Results (from the past 240 hour(s))  MRSA PCR SCREENING     Status: None   Collection Time    01/29/13  5:30 AM      Result Value Range Status   MRSA by PCR NEGATIVE  NEGATIVE Final   Comment:            The GeneXpert MRSA Assay (FDA     approved for NASAL specimens     only), is one component of a     comprehensive MRSA colonization     surveillance program. It is not     intended to diagnose MRSA     infection nor to guide or     monitor treatment for     MRSA infections.     Studies:  Recent x-ray studies have been reviewed in detail by the Attending Physician  Scheduled Meds:  Scheduled Meds: . [START ON 01/31/2013] ceFEPime (MAXIPIME) IV  2 g Intravenous Q M,W,F-2000  . cinacalcet  30 mg Oral BID WC  . [START ON 01/31/2013] darbepoetin (ARANESP) injection - DIALYSIS  60 mcg Intravenous Q Mon-HD  . feeding supplement (NEPRO CARB STEADY)  237 mL Oral BID BM  . gabapentin  100 mg Oral Daily  . lanthanum  500 mg Oral TID WC  . multivitamin  1 tablet Oral Daily  . omega-3 acid ethyl esters  1 g Oral Daily  . sodium chloride  3 mL Intravenous Q12H  . sodium chloride  3 mL Intravenous Q12H  . [START ON 01/31/2013] vancomycin  750 mg Intravenous Q M,W,F-HD   Continuous Infusions:   Time spent on care of this patient: 25 min   Calvert Cantor, MD [phone]  [facility] Office  [phone] Pager - Text Page per Amion as per below:  On-Call/Text Page:      [email or web address]      password [email or web address]  If 7PM-7AM, please contact night-coverage [email or web address] Password [facility] 01/30/2013, 11:46 AM   LOS: 1 day

## 2013-01-30 NOTE — Progress Notes (Signed)
Subjective: Pt resting no complaints  Objective Filed Vitals:   01/29/13 2040 01/29/13 2100 01/30/13 0037 01/30/13 0408  BP: 95/52 91/56 89/47  85/44  Pulse: 98 97 101 98  Temp: 99 F (37.2 C)  99.6 F (37.6 C) 91 F (32.8 C)  TempSrc: Oral  Axillary Axillary  Resp: 24 21 24 19   Height:      Weight:    85 kg (187 lb 6.3 oz)  SpO2: 99% 96% 97% 98%    CBC:  Recent Labs Lab 01/29/13 0015 01/29/13 0118 01/29/13 0848  WBC 18.7*  --  25.7*  NEUTROABS 16.8*  --  21.2*  HGB 6.4* 7.1* 10.1*  HCT 19.2* 21.0* 31.4*  MCV 83.1  --  84.4  PLT 382  --  460*   Basic Metabolic Panel:  Recent Labs Lab 01/29/13 0015 01/29/13 0118 01/29/13 0848  NA 142 145 137  K 2.2* 2.8* 4.2  CL 113* 112 99  CO2 20  --  27  GLUCOSE 80 72 134*  BUN 25* 23 38*  CREATININE 2.24* 2.70* 3.67*  CALCIUM 5.9*  --  9.1    Physical Exam:  Blood pressure 85/44, pulse 98, temperature 91 F (32.8 C), temperature source Axillary, resp. rate 19, height 6\' 6"  (1.981 m), weight 85 kg (187 lb 6.3 oz), SpO2 98.00%.  Gen: frail elderly AAM in no distress  HEENT: EOMI, sclera anicteric, throat is clear  Neck: no JVD, no LAN  Chest: clear bilat, no rales or rhonchi  CV: regular, no rub or gallop, pedal pulses intact  Abdomen: soft, nontender, scaphoid, no HSM or ascites Back: large sacral decub per staff (not examined)  Ext: no LE or UE edema, no joint effusion or deformity, no gangrene or ulceration  Neuro: alert, oriented to place and not year  Access: LUA AVF+bruit   Outpatient HD: MWF [facility] 4hrs F180 500/A1.5 EDW 77.5kg 2K/2Ca Bath LUA AVF EPO 6200 Heparin 5000   Impression/Plan  1. Septic shock / fever / ^WBC- prob due to infected stage IV sacral decub. On abx, per primary 2. ESRD, cont HD MWF. HD Monday  3. HTN/volume- up 8kg by weight, CXR clear, looks dry on exam and BP low. UF if BP tolerates Mon w HD 4. Anemia of CKD- Hb 7 > 10 after prbc's 6/21, cont ESA darbepoetin 60ug/wk 5. Secondary  HPTH- not on vit D, cont sensipar and lanthanum 6. Stage IV sacral decub 7. DNR  [name]  MD [phone] pgr    [phone] cell 01/30/2013, 6:03 AM

## 2013-01-31 DIAGNOSIS — R6521 Severe sepsis with septic shock: Secondary | ICD-10-CM

## 2013-01-31 DIAGNOSIS — A419 Sepsis, unspecified organism: Secondary | ICD-10-CM

## 2013-01-31 MED ORDER — LIDOCAINE HCL (PF) 1 % IJ SOLN: 5.0000 mL | INTRAMUSCULAR | Status: DC | PRN

## 2013-01-31 MED ORDER — SODIUM CHLORIDE 0.9 % IV SOLN: 100.0000 mL | INTRAVENOUS | Status: DC | PRN

## 2013-01-31 MED ORDER — MORPHINE SULFATE 2 MG/ML IJ SOLN
1.0000 mg | INTRAMUSCULAR | Status: DC | PRN
  Administered 2013-02-05 – 2013-02-16 (×4): 2 mg via INTRAVENOUS
  Filled 2013-01-31 (×4): qty 1

## 2013-01-31 MED ORDER — DARBEPOETIN ALFA-POLYSORBATE 60 MCG/0.3ML IJ SOLN
INTRAMUSCULAR | Status: AC
  Filled 2013-01-31: qty 0.3

## 2013-01-31 MED ORDER — PENTAFLUOROPROP-TETRAFLUOROETH EX AERO: 1.0000 "application " | INHALATION_SPRAY | CUTANEOUS | Status: DC | PRN

## 2013-01-31 MED ORDER — LIDOCAINE-PRILOCAINE 2.5-2.5 % EX CREA: 1.0000 "application " | TOPICAL_CREAM | CUTANEOUS | Status: DC | PRN

## 2013-01-31 MED ORDER — VANCOMYCIN 50 MG/ML ORAL SOLUTION
125.0000 mg | Freq: Every day | ORAL | Status: DC
Start: 2013-01-31 — End: 2013-02-03
  Administered 2013-02-01 – 2013-02-03 (×4): 125 mg via ORAL
  Filled 2013-01-31 (×4): qty 2.5

## 2013-01-31 MED ORDER — COLLAGENASE 250 UNIT/GM EX OINT
TOPICAL_OINTMENT | Freq: Every day | CUTANEOUS | Status: DC
  Administered 2013-01-31 – 2013-02-07 (×8): via TOPICAL
  Filled 2013-01-31 (×2): qty 30

## 2013-01-31 MED ORDER — HEPARIN SODIUM (PORCINE) 1000 UNIT/ML DIALYSIS
5000.0000 [IU] | Freq: Once | INTRAMUSCULAR | Status: AC
  Administered 2013-01-31: 5000 [IU] via INTRAVENOUS_CENTRAL

## 2013-01-31 MED ORDER — HEPARIN SODIUM (PORCINE) 1000 UNIT/ML DIALYSIS: 1000.0000 [IU] | INTRAMUSCULAR | Status: DC | PRN

## 2013-01-31 MED ORDER — OXYCODONE HCL 5 MG PO TABS
5.0000 mg | ORAL_TABLET | ORAL | Status: DC | PRN
  Administered 2013-02-04: 10 mg via ORAL
  Filled 2013-01-31: qty 1
  Filled 2013-01-31: qty 2

## 2013-01-31 MED ORDER — NEPRO/CARBSTEADY PO LIQD
237.0000 mL | ORAL | Status: DC | PRN
  Filled 2013-01-31: qty 237

## 2013-01-31 MED ORDER — ALTEPLASE 2 MG IJ SOLR: 2.0000 mg | Freq: Once | INTRAMUSCULAR | Status: DC | PRN

## 2013-01-31 NOTE — Progress Notes (Addendum)
[facility] Progress Note [place] TEAM 1 - Stepdown/ICU TEAM   [name] WUJ:[id number] DOB: [birth year] DOA: 01/29/2013 PCP: Crawford Givens, MD  Brief narrative: 79 y.o. male history of ESRD on hemodialysis who was recently admitted (5/19 - 01/10/2013) for sepsis secondary to C. difficile colitis brought back to the ER from nursing home 6/21 after patient was found to be hypotensive. On arrival patient also was found to be febrile, tachycardic, and hemoglobin low from his baseline. Patient was started on empiric antibiotics after blood cultures were obtained. Patient's sacral decubitus had a discharge on exam at presentation. Patient was found to be hypotensive and was given 1 L normal saline bolus and was started on empiric antibiotics along with PRBC transfusion. Patient's family had a detailed discussion with ER physician Dr. Arnoldo Morale and they requested to continue with DO NOT RESUSCITATE status and no vasopressors. They discussed the possibility of moving towards comfort measures.   Assessment/Plan:  MRSA Sepsis / leukocytosis - cont current antibiotics - vanc and cefepime - pending GOC meeting  - suspected source of entry is sacra decubitus - still hypotensive but fevers resolved  - blood cx both + for MRSA (2/2)   Decubitus ulcer of sacral region, stage 4 - wound care consult performed - wounds are felt to be beyond conservative tx at this time - if aggressive care is to continue, Gen Surg consult with extensive operative debridement will likely be the only option - pt has not been able to tolerate hydrotherapy previously due to severe pain - Palliative Care consult requested to establish GOC with family   End stage renal disease - cont dialysis per Nephrology for now   Anemia of chronic disease - stable  Protein-calorie malnutrition, severe - nutritional supplements  DM CBG reasonably controlled at present time  Hx of HTN  Hx of CVA with suspected vascular  dementia  Decubitus ulcer of R heel  Severe protein calorie malnutrition  Code Status: DNR Family Communication: No family present at time of exam Disposition Plan: follow in SDU until GOC established   Consultants: Nephro Palliative Care  Procedures: none  Antibiotics: Vanc 6/20 >> Cefepime 6/21 >>  DVT prophylaxis: SCDs + SQ heparin   HPI/Subjective: Patient is alert and interactive but not oriented.  He cannot provide a reliable history due to confusion.  Objective: Blood pressure 105/62, pulse 109, temperature 98.2 F (36.8 C), temperature source Oral, resp. rate 21, height 6\' 6"  (1.981 m), weight 84.5 kg (186 lb 4.6 oz), SpO2 99.00%.  Intake/Output Summary (Last 24 hours) at 01/31/13 1803 Last data filed at 01/31/13 1800  Gross per 24 hour  Intake    723 ml  Output   2133 ml  Net  -1410 ml   Exam: General: No acute respiratory distress Lungs: Clear to auscultation bilaterally without wheezes or crackles Cardiovascular: Tachycardic but regular without appreciable murmur or gallop Abdomen: Nontender, nondistended, soft, bowel sounds positive, no rebound, no ascites, no appreciable mass Extremities: No significant cyanosis, clubbing, or edema bilateral lower extremities Skin: large stage 4 decubitus ulcer with significant amount of necrotic tissue  Data Reviewed: Basic Metabolic Panel:  Recent Labs Lab 01/29/13 0015 01/29/13 0118 01/29/13 0130 01/29/13 0848 01/30/13 0830  NA 142 145  --  137 132*  K 2.2* 2.8*  --  4.2 4.5  CL 113* 112  --  99 95*  CO2 20  --   --  27 25  GLUCOSE 80 72  --  134* 118*  BUN  25* 23  --  38* 52*  CREATININE 2.24* 2.70*  --  3.67* 5.04*  CALCIUM 5.9*  --   --  9.1 8.9  MG  --   --  1.4*  --   --    Liver Function Tests:  Recent Labs Lab 01/29/13 0015 01/29/13 0848  AST 31 45*  ALT 25 35  ALKPHOS 43 71  BILITOT 0.2* 0.4  PROT 5.1* 7.4  ALBUMIN 1.4* 2.0*    Recent Labs Lab 01/29/13 0015  LIPASE 41    CBC:  Recent Labs Lab 01/29/13 0015 01/29/13 0118 01/29/13 0848  WBC 18.7*  --  25.7*  NEUTROABS 16.8*  --  21.2*  HGB 6.4* 7.1* 10.1*  HCT 19.2* 21.0* 31.4*  MCV 83.1  --  84.4  PLT 382  --  460*   CBG:  Recent Labs Lab 01/29/13 0217  GLUCAP 124*    Recent Results (from the past 240 hour(s))  CULTURE, BLOOD (ROUTINE X 2)     Status: None   Collection Time    01/29/13 12:30 AM      Result Value Range Status   Specimen Description BLOOD RIGHT ARM   Final   Special Requests BOTTLES DRAWN AEROBIC AND ANAEROBIC 10CC EACH   Final   Culture  Setup Time 01/29/2013 15:10   Final   Culture     Final   Value: METHICILLIN RESISTANT STAPHYLOCOCCUS AUREUS     Note: RIFAMPIN AND GENTAMICIN SHOULD NOT BE USED AS SINGLE DRUGS FOR TREATMENT OF STAPH INFECTIONS. CRITICAL RESULT CALLED TO, READ BACK BY AND VERIFIED WITH: ANNA AGUILAR 01/31/13 1440 BY SMITHERSJ     Note: Gram Stain Report Called to,Read Back By and Verified With: BERNADETTE LOMCALLO 01/30/13 @ 1:14PM BY RUSCA.   Report Status PENDING   Incomplete  CULTURE, BLOOD (ROUTINE X 2)     Status: None   Collection Time    01/29/13 12:45 AM      Result Value Range Status   Specimen Description BLOOD LEFT HAND   Final   Special Requests BOTTLES DRAWN AEROBIC ONLY 10CC   Final   Culture  Setup Time 01/29/2013 15:11   Final   Culture     Final   Value: STAPHYLOCOCCUS AUREUS     Note: Gram Stain Report Called to,Read Back By and Verified With: BERNADETTE LOMCALLO 01/30/13 @ 1:14PM BY RUSCA.   Report Status PENDING   Incomplete  URINE CULTURE     Status: None   Collection Time    01/29/13  1:56 AM      Result Value Range Status   Specimen Description URINE, RANDOM   Final   Special Requests NONE   Final   Culture  Setup Time 01/29/2013 15:24   Final   Colony Count NO GROWTH   Final   Culture NO GROWTH   Final   Report Status 01/30/2013 FINAL   Final  MRSA PCR SCREENING     Status: None   Collection Time    01/29/13  5:30 AM       Result Value Range Status   MRSA by PCR NEGATIVE  NEGATIVE Final   Comment:            The GeneXpert MRSA Assay (FDA     approved for NASAL specimens     only), is one component of a     comprehensive MRSA colonization     surveillance program. It is not     intended to  diagnose MRSA     infection nor to guide or     monitor treatment for     MRSA infections.     Studies:  Recent x-ray studies have been reviewed in detail by the Attending Physician  Scheduled Meds:  Scheduled Meds: . ceFEPime (MAXIPIME) IV  2 g Intravenous Q M,W,F-2000  . cinacalcet  30 mg Oral BID WC  . collagenase   Topical Daily  . darbepoetin (ARANESP) injection - DIALYSIS  60 mcg Intravenous Q Mon-HD  . feeding supplement (NEPRO CARB STEADY)  237 mL Oral BID BM  . gabapentin  100 mg Oral Daily  . lanthanum  500 mg Oral TID WC  . multivitamin  1 tablet Oral Daily  . omega-3 acid ethyl esters  1 g Oral Daily  . sodium chloride  3 mL Intravenous Q12H  . sodium chloride  3 mL Intravenous Q12H  . vancomycin  750 mg Intravenous Q M,W,F-HD    Time spent on care of this patient: 25 min   Darlisha Kelm T, MD   [facility] Office  [phone] Pager - Text Page per Loretha Stapler as per below:  On-Call/Text Page:      [email or web address]      password [email or web address]  If 7PM-7AM, please contact night-coverage [email or web address] Password [facility] 01/31/2013, 6:03 PM   LOS: 2 days

## 2013-01-31 NOTE — Progress Notes (Signed)
Hand hygiene education provided to family members by BS. Family members verbalize understanding and are compliant with contact precautions

## 2013-01-31 NOTE — Consult Note (Addendum)
WOC consult Note Reason for Consult: Sacral pressure ulcer Wound type: Stage IV to sacrum Pressure Ulcer POA: Yes Measurement: 10cm X 10cm X 1.25cm, with 1cm undermining from 3 o'clock to 6 o'clock Wound bed: 60% yellow 40% red Drainage (amount, consistency, odor): Large amount of yellow drainage present, strong foul odor Periwound: Intact Dressing procedure/placement/frequency: Patient has had hydrotherapy in the past and stopped it due it being painful.  Patient is unsure if he wishes to resume hydotherapy.  If aggressive treatment is desired, the patient may benefit from surgical debridement.  Patient has also used Santyl ointment in the past for debridement of nonviable tissue.  Will order Santyl ointment to wound, pack with moist saline gauze, covered with ABD pad and secure with tape.  Will order air mattress and Prevalon boots to prevent further damage by pressure.  Wound type: Right heel Unstageable Pressure Ulcer POA: Yes Measurement: 6cm X 2cm Wound bed: 75% black 25% yellow Drainage (amount, consistency, odor): None, no odor Periwound: Intact Dressing procedure/placement/frequency: Foam dressing placed, to absorb drainage and protect area,may change every five days or as needed for soiling.  Wound type: Deep tissue injury to R and left ischium Pressure Ulcer POA: Yes Measurement: Left 3cm X 1cm and Right 2cm X 4cm Wound bed: 100% dark purple Drainage (amount, consistency, odor): No drainage, no odor Periwound: Intact Dressing procedure/placement/frequency: Foam dressings in place over areas to protect, may change every five days or as needed for soiling.  Wound type: Stage II to left lateral aspect of foot. Pressure Ulcer POA: Yes Measurement: 2cm X 2cm Wound bed: 100% pink Drainage (amount, consistency, odor): No drainage, no odor Periwound: Intact Dressing procedure/placement/frequency: Foam dressings in place over area to protect, may change every five days or as needed  for soiling.  Wound type: Deep tissue injury to left heel Pressure Ulcer POA: Yes Measurement: 2.5cm X 1cm Wound bed: 100%dark purple Drainage (amount, consistency, odor): No drainage, no odor Periwound: Intact Dressing procedure/placement/frequency: Foam dressings in place over area to protect, may change every five days or as needed for soiling. Discussed plan of care with primary team.  Pt could benefit from Palliative care consult to determine goals of care. Norva Karvonen RN, MSN Student Cammie Mcgee MSN, RN, CWOCN, [place], CNS [phone]

## 2013-01-31 NOTE — Progress Notes (Signed)
S:Pt seen on HD  On 2K bath  Denies any CO O:BP 101/61  Pulse 104  Temp(Src) 98.6 F (37 C) (Oral)  Resp 23  Ht 6\' 6"  (1.981 m)  Wt 86 kg (189 lb 9.5 oz)  BMI 21.91 kg/m2  SpO2 98%  Intake/Output Summary (Last 24 hours) at 01/31/13 0855 Last data filed at 01/30/13 2200  Gross per 24 hour  Intake    540 ml  Output    150 ml  Net    390 ml   Weight change: 1 kg (2 lb 3.3 oz) ZOX:WRUEA and alert CVS:Sl tachy reg Resp:clear ant Abd:+ BS NTND Ext:Rt heel wrapped in bandage.  L AVF NEURO:Not oriented but tries to answer ? Did not look at decubitus as pt on HD   . ceFEPime (MAXIPIME) IV  2 g Intravenous Q M,W,F-2000  . cinacalcet  30 mg Oral BID WC  . darbepoetin      . darbepoetin (ARANESP) injection - DIALYSIS  60 mcg Intravenous Q Mon-HD  . feeding supplement (NEPRO CARB STEADY)  237 mL Oral BID BM  . gabapentin  100 mg Oral Daily  . lanthanum  500 mg Oral TID WC  . multivitamin  1 tablet Oral Daily  . omega-3 acid ethyl esters  1 g Oral Daily  . sodium chloride  3 mL Intravenous Q12H  . sodium chloride  3 mL Intravenous Q12H  . vancomycin  750 mg Intravenous Q M,W,F-HD   No results found. BMET    Component Value Date/Time   NA 132* 01/30/2013 0830   K 4.5 01/30/2013 0830   CL 95* 01/30/2013 0830   CO2 25 01/30/2013 0830   GLUCOSE 118* 01/30/2013 0830   BUN 52* 01/30/2013 0830   CREATININE 5.04* 01/30/2013 0830   CALCIUM 8.9 01/30/2013 0830   CALCIUM 9.9 09/24/2007 1113   GFRNONAA 10* 01/30/2013 0830   GFRAA 11* 01/30/2013 0830   CBC    Component Value Date/Time   WBC 25.7* 01/29/2013 0848   RBC 3.72* 01/29/2013 0848   HGB 10.1* 01/29/2013 0848   HCT 31.4* 01/29/2013 0848   PLT 460* 01/29/2013 0848   MCV 84.4 01/29/2013 0848   MCH 27.2 01/29/2013 0848   MCHC 32.2 01/29/2013 0848   RDW 16.5* 01/29/2013 0848   LYMPHSABS 1.8 01/29/2013 0848   MONOABS 2.1* 01/29/2013 0848   EOSABS 0.3 01/29/2013 0848   BASOSABS 0.3* 01/29/2013 0848     Assessment: 1. Gm + bacteremia 2.  Sacral decubitus 3. Anemia on aranesp 4. Sec HPTH on sensipar Plan: 1.  HD today 2. Cont AB pending final ID  [name] T

## 2013-01-31 NOTE — Progress Notes (Signed)
Thank you for consulting the Palliative Medicine Team at [facility] to meet your patient's and family's needs.   The reason that you asked [place] to see your patient is  For Goals of Care  We have scheduled your patient for a meeting: 02/01/13 at 11:30a  The Surrogate decision make is: [name] (son/HPOA) Contact information: H# [phone] C# [phone]  Given PMT phone contact number   Other family members that need to be present: [name] (spouse of patient), [name] patients son, will see if his sister can participate via phone    Your patient is able/unable to participate: not oriented, unsure of location-confused at time of assessment  Additional Narrative: patient was seen last admission on 12/27/12 by PMT   Freddie Breech, CNS-C Palliative Medicine Team [facility] Team Phone: [phone] Pager: [phone]

## 2013-01-31 NOTE — Consult Note (Signed)
[facility]  Total days of antibiotics 2        Day 2 cefepime        Day 2 vanco               Reason for Consult: staph aureus bacteremia    Referring Physician: McClung/auto-consultation  Principal Problem:   Sepsis Active Problems:   End stage renal disease   Anemia of chronic disease   Decubitus ulcer of sacral region, stage 4   Protein-calorie malnutrition, severe    HPI: [name] is a 79 y.o. male who has ESRD on hemodialysis who  Recently had sepsis due to c.difficile colitis on (5/19 - 01/10/2013) was brought back from nursing home 6/21 after patient was found to be hypotensive with sBP 60. On arrival patient also was found to be febrile, tachycardic, and anemic. Patient was started on empiric antibiotics of vancomycin and cefepime after blood cultures were obtained and given 1 L normal saline in addn to PRBC transfusion for anemia. Patient's blood cultures reveal him to have MRSA bacteremia. He also sufferes from stage IV sacral decub with palpable bone that is thought to be the nidus of his bacteremia. His is DNR, but further goals of care discussions are anticipated. ID consultation is seeing him based upon his MRSA bacteremia. Wound care consultation has provided recs for his numerous decubitus ulcers.10cm X 10cm X 1.25cm, with 1cm undermining from 3 o'clock to 6 o'clock  Wound bed: 60% yellow exudate 40% red tissue, right heal has an unstageable ulcer that is 6 x 2 cm. deept tissue injury on right and left ischium 2 x 4 and 3 x1 cm respectively, steage 2 ulcer on left lateral foot 2 x 2 cm, and left heel deep tissue injury of 2.5 cm x 1 cm. He has had hydrotherapy in the past but caused too much pain to continue.    Past Medical History  Diagnosis Date  . Hypertension   . Diabetes mellitus     typeII / No meds  . Cataract 01/2002    Right  . Dialysis patient 10/13/2007  . Radiculopathy 05/14/2006    NCV study negative carpal tunnel, Left  C5 radiculopathy  . Arthritis     Knee pain  . ESRD (end stage renal disease) on dialysis 2009    [facility] diaylsis Monday , Wed. and Friday per Dr. Kathrene Bongo  . Stroke   . Elevated PSA     h/o, pt had declined further eval.   . Syncope 11/02/2012    Allergies: No Known Allergies   MEDICATIONS: . ceFEPime (MAXIPIME) IV  2 g Intravenous Q M,W,F-2000  . cinacalcet  30 mg Oral BID WC  . collagenase   Topical Daily  . darbepoetin (ARANESP) injection - DIALYSIS  60 mcg Intravenous Q Mon-HD  . feeding supplement (NEPRO CARB STEADY)  237 mL Oral BID BM  . gabapentin  100 mg Oral Daily  . lanthanum  500 mg Oral TID WC  . multivitamin  1 tablet Oral Daily  . omega-3 acid ethyl esters  1 g Oral Daily  . sodium chloride  3 mL Intravenous Q12H  . vancomycin  750 mg Intravenous Q M,W,F-HD    History  Substance Use Topics  . Smoking status: Former Smoker -- 10 years    Types: Cigarettes    Quit date: 04/01/2002  . Smokeless tobacco: Never Used     Comment: quit ove 20 years  .  Alcohol Use: No    Family History  Problem Relation Age of Onset  . Cancer Father 52    Lung Cancer//? stomach cancer  . Cancer Sister 6    brain tumor    Review of Systems -  Unable to respond to questions due to altered mental status  OBJECTIVE: Temp:  [98.2 F (36.8 C)-100.4 F (38 C)] 98.6 F (37 C) (06/23 2000) Pulse Rate:  [101-109] 109 (06/23 1600) Resp:  [19-28] 21 (06/23 1154) BP: (90-105)/(46-62) 105/62 mmHg (06/23 1600) SpO2:  [92 %-100 %] 99 % (06/23 1600) Weight:  [186 lb 4.6 oz (84.5 kg)-189 lb 9.5 oz (86 kg)] 186 lb 4.6 oz (84.5 kg) (06/23 1049)  General: No acute respiratory distress, frail appearing, in mild discomfort from wound change Lungs: Clear to auscultation bilaterally without wheezes or crackles  Cardiovascular: Tachycardic but regular without appreciable murmur or gallop  Abdomen: Nontender, nondistended, soft, bowel sounds positive, no rebound, no  ascites, no appreciable mass  Extremities: No significant cyanosis, clubbing, or edema bilateral lower extremities  Skin: large stage 4 decubitus ulcer with significant amount of necrotic tissue, palpable bone at wound bed. Decub ulcers on heels are covered  LABS: Results for orders placed during the hospital encounter of 01/29/13 (from the past 48 hour(s))  BASIC METABOLIC PANEL     Status: Abnormal   Collection Time    01/30/13  8:30 AM      Result Value Range   Sodium 132 (*) 135 - 145 mEq/L   Potassium 4.5  3.5 - 5.1 mEq/L   Chloride 95 (*) 96 - 112 mEq/L   CO2 25  19 - 32 mEq/L   Glucose, Bld 118 (*) 70 - 99 mg/dL   BUN 52 (*) 6 - 23 mg/dL   Creatinine, Ser 1.61 (*) 0.50 - 1.35 mg/dL   Calcium 8.9  8.4 - 09.6 mg/dL   GFR calc non Af Amer 10 (*) >90 mL/min   GFR calc Af Amer 11 (*) >90 mL/min   Comment:            The eGFR has been calculated     using the CKD EPI equation.     This calculation has not been     validated in all clinical     situations.     eGFR's persistently     <90 mL/min signify     possible Chronic Kidney Disease.  SEDIMENTATION RATE     Status: Abnormal   Collection Time    01/30/13  7:37 PM      Result Value Range   Sed Rate 74 (*) 0 - 16 mm/hr    MICRO: 6/21 blood cx: MRSA 6/21 urine cx : NGTD 5/20 cdiff POSITIVE  Assessment/Plan:  79 yo M with ESRD on HD, stage 4 sacral decub/probable osteomyelitis presents with sepsis found to have MRSA bacteremia and worsening decub ulcers/osteomyelitis.  - continue with vancomycin and cefepime for presumed osteo and mrsa bacteremia - if family decides to pursue more aggressive measures, recommend he gets repeat blood culltures on 6/24 to document clearance of bacteremia, and TTE to evaluate for vegetations - presumed osteomyelitis: if they decide to pursue continuing with antibiotics, he will need to vancomycin x 6 wks in addn to gram negative and anaerobic coverage. - c.difficile infection, continue with  enteric precautions as well as oral vancomycin 125mg  daily, as part of taper to that he does not have recurrent c.difficile infection due to antibiotics for MRSA bacteremia and  osteomyelitis. If having worsening stool output, would increase oral vanco to QID. - agree with primary team's approach in getting goals of care discussion  Aram Beecham B. Drue Second MD MPH [facility] [phone]

## 2013-02-01 DIAGNOSIS — Z515 Encounter for palliative care: Secondary | ICD-10-CM

## 2013-02-01 DIAGNOSIS — T148XXA Other injury of unspecified body region, initial encounter: Secondary | ICD-10-CM

## 2013-02-01 DIAGNOSIS — IMO0002 Reserved for concepts with insufficient information to code with codable children: Secondary | ICD-10-CM

## 2013-02-01 DIAGNOSIS — L8990 Pressure ulcer of unspecified site, unspecified stage: Secondary | ICD-10-CM

## 2013-02-01 LAB — CULTURE, BLOOD (ROUTINE X 2)

## 2013-02-01 LAB — CBC
HCT: 27.2 % — ABNORMAL LOW (ref 39.0–52.0)
Hemoglobin: 8.9 g/dL — ABNORMAL LOW (ref 13.0–17.0)
MCH: 26.9 pg (ref 26.0–34.0)
MCHC: 32.7 g/dL (ref 30.0–36.0)
RDW: 16.7 % — ABNORMAL HIGH (ref 11.5–15.5)

## 2013-02-01 MED ORDER — MIDODRINE HCL 5 MG PO TABS
10.0000 mg | ORAL_TABLET | Freq: Three times a day (TID) | ORAL | Status: DC
  Administered 2013-02-01 – 2013-02-11 (×26): 10 mg via ORAL
  Filled 2013-02-01 (×33): qty 2

## 2013-02-01 NOTE — Progress Notes (Signed)
S:Pt seen on HD  On 2K bath  Denies any CO O:BP 86/46  Pulse 106  Temp(Src) 97.9 F (36.6 C) (Oral)  Resp 24  Ht 6\' 6"  (1.981 m)  Wt 84.9 kg (187 lb 2.7 oz)  BMI 21.63 kg/m2  SpO2 95%  Intake/Output Summary (Last 24 hours) at 02/01/13 0738 Last data filed at 02/01/13 0000  Gross per 24 hour  Intake    533 ml  Output   2083 ml  Net  -1550 ml   Weight change: -1.5 kg (-3 lb 4.9 oz) WUJ:WJXBJ and alert CVS:Sl tachy reg Resp:clear ant Abd:+ BS NTND Ext:Rt heel wrapped in bandage.  L AVF NEURO:Not oriented but tries to answer ? Large sacral decubitus extending to bone   . ceFEPime (MAXIPIME) IV  2 g Intravenous Q M,W,F-2000  . cinacalcet  30 mg Oral BID WC  . collagenase   Topical Daily  . darbepoetin (ARANESP) injection - DIALYSIS  60 mcg Intravenous Q Mon-HD  . feeding supplement (NEPRO CARB STEADY)  237 mL Oral BID BM  . gabapentin  100 mg Oral Daily  . lanthanum  500 mg Oral TID WC  . multivitamin  1 tablet Oral Daily  . omega-3 acid ethyl esters  1 g Oral Daily  . sodium chloride  3 mL Intravenous Q12H  . vancomycin  125 mg Oral Daily  . vancomycin  750 mg Intravenous Q M,W,F-HD   No results found. BMET    Component Value Date/Time   NA 132* 01/30/2013 0830   K 4.5 01/30/2013 0830   CL 95* 01/30/2013 0830   CO2 25 01/30/2013 0830   GLUCOSE 118* 01/30/2013 0830   BUN 52* 01/30/2013 0830   CREATININE 5.04* 01/30/2013 0830   CALCIUM 8.9 01/30/2013 0830   CALCIUM 9.9 09/24/2007 1113   GFRNONAA 10* 01/30/2013 0830   GFRAA 11* 01/30/2013 0830   CBC    Component Value Date/Time   WBC 18.8* 02/01/2013 0510   RBC 3.31* 02/01/2013 0510   HGB 8.9* 02/01/2013 0510   HCT 27.2* 02/01/2013 0510   PLT 451* 02/01/2013 0510   MCV 82.2 02/01/2013 0510   MCH 26.9 02/01/2013 0510   MCHC 32.7 02/01/2013 0510   RDW 16.7* 02/01/2013 0510   LYMPHSABS 1.8 01/29/2013 0848   MONOABS 2.1* 01/29/2013 0848   EOSABS 0.3 01/29/2013 0848   BASOSABS 0.3* 01/29/2013 0848     Assessment: 1. MRSA  bacteremia 2. Sacral decubitus 3. Anemia on aranesp 4. Sec HPTH on sensipar Plan: 1.  could consider DCing cefepime 2. Plan HD in AM and will check renal/cbc at that time 3. Cont vanco 4. Will add midodrine to see if helps with BP 5.  His prognosis is poor and I think moving to comfort care would be the best [name]

## 2013-02-01 NOTE — Plan of Care (Signed)
Problem: Phase I Progression Outcomes Goal: OOB as tolerated unless otherwise ordered Outcome: Not Met (add Reason) Pt. Bedridden with lge sacral decubiti

## 2013-02-01 NOTE — Progress Notes (Signed)
[facility] Progress Note [place] TEAM 1 - Stepdown/ICU TEAM   [name] WUJ:[id number] DOB: February 16, 1931 DOA: 01/29/2013 PCP: Crawford Givens, MD  Brief narrative: 79 y.o. male history of ESRD on hemodialysis who was recently admitted (5/19 - 01/10/2013) for sepsis secondary to C. difficile colitis brought back to the ER from nursing home 6/21 after patient was found to be hypotensive. On arrival patient also was found to be febrile, tachycardic, and hemoglobin low from his baseline. Patient was started on empiric antibiotics after blood cultures were obtained. Patient's sacral decubitus had a discharge on exam at presentation. Patient was found to be hypotensive and was given 1 L normal saline bolus and was started on empiric antibiotics along with PRBC transfusion. Patient's family had a detailed discussion with ER physician Dr. Arnoldo Morale and they requested to continue with DO NOT RESUSCITATE status and no vasopressors. They discussed the possibility of moving towards comfort measures.   Assessment/Plan:  MRSA Sepsis / leukocytosis - cont current antibiotics - vanc and cefepime - pending GOC meeting  - suspected source of entry is sacra decubitus - still hypotensive but fevers resolved  - blood cx both + for MRSA (2/2)   Decubitus ulcer of sacral region, stage 4 - wound care consult performed - wounds are felt to be beyond conservative tx at this time - if aggressive care is to continue, Gen Surg consult with extensive operative debridement will likely be the only option - pt has not been able to tolerate hydrotherapy previously due to severe pain - Palliative Care consult requested to establish GOC with family   End stage renal disease - cont dialysis per Nephrology for now   Anemia of chronic disease - stable  Protein-calorie malnutrition, severe - nutritional supplements  DM CBG reasonably controlled at present time  Hx of HTN  Hx of CVA with suspected vascular  dementia  Decubitus ulcer of R heel  Severe protein calorie malnutrition  Code Status: DNR Family Communication: No family present at time of exam Disposition Plan: follow in SDU until GOC established   Consultants: Nephro Palliative Care  Procedures: none  Antibiotics: Vanc 6/20 >> Cefepime 6/21 >>  DVT prophylaxis: SCDs + SQ heparin   HPI/Subjective: Patient is alert and interactive but not oriented.  No complaints.   Objective: Blood pressure 100/65, pulse 108, temperature 98.9 F (37.2 C), temperature source Axillary, resp. rate 27, height 6\' 6"  (1.981 m), weight 84.9 kg (187 lb 2.7 oz), SpO2 96.00%.  Intake/Output Summary (Last 24 hours) at 02/01/13 1426 Last data filed at 02/01/13 1151  Gross per 24 hour  Intake    290 ml  Output    230 ml  Net     60 ml   Exam: General: No acute respiratory distress Lungs: Clear to auscultation bilaterally without wheezes or crackles Cardiovascular: Tachycardic but regular without appreciable murmur or gallop Abdomen: Nontender, nondistended, soft, bowel sounds positive, no rebound, no ascites, no appreciable mass Extremities: No significant cyanosis, clubbing, or edema bilateral lower extremities Skin: large stage 4 decubitus ulcer with significant amount of necrotic tissue  Data Reviewed: Basic Metabolic Panel:  Recent Labs Lab 01/29/13 0015 01/29/13 0118 01/29/13 0130 01/29/13 0848 01/30/13 0830  NA 142 145  --  137 132*  K 2.2* 2.8*  --  4.2 4.5  CL 113* 112  --  99 95*  CO2 20  --   --  27 25  GLUCOSE 80 72  --  134* 118*  BUN 25* 23  --  38* 52*  CREATININE 2.24* 2.70*  --  3.67* 5.04*  CALCIUM 5.9*  --   --  9.1 8.9  MG  --   --  1.4*  --   --    Liver Function Tests:  Recent Labs Lab 01/29/13 0015 01/29/13 0848  AST 31 45*  ALT 25 35  ALKPHOS 43 71  BILITOT 0.2* 0.4  PROT 5.1* 7.4  ALBUMIN 1.4* 2.0*    Recent Labs Lab 01/29/13 0015  LIPASE 41   CBC:  Recent Labs Lab 01/29/13 0015  01/29/13 0118 01/29/13 0848 02/01/13 0510  WBC 18.7*  --  25.7* 18.8*  NEUTROABS 16.8*  --  21.2*  --   HGB 6.4* 7.1* 10.1* 8.9*  HCT 19.2* 21.0* 31.4* 27.2*  MCV 83.1  --  84.4 82.2  PLT 382  --  460* 451*   CBG:  Recent Labs Lab 01/29/13 0217  GLUCAP 124*    Recent Results (from the past 240 hour(s))  CULTURE, BLOOD (ROUTINE X 2)     Status: None   Collection Time    01/29/13 12:30 AM      Result Value Range Status   Specimen Description BLOOD RIGHT ARM   Final   Special Requests BOTTLES DRAWN AEROBIC AND ANAEROBIC 10CC EACH   Final   Culture  Setup Time 01/29/2013 15:10   Final   Culture     Final   Value: METHICILLIN RESISTANT STAPHYLOCOCCUS AUREUS     Note: RIFAMPIN AND GENTAMICIN SHOULD NOT BE USED AS SINGLE DRUGS FOR TREATMENT OF STAPH INFECTIONS. CRITICAL RESULT CALLED TO, READ BACK BY AND VERIFIED WITH: ANNA AGUILAR 01/31/13 1440 BY SMITHERSJ     Note: Gram Stain Report Called to,Read Back By and Verified With: BERNADETTE LOMCALLO 01/30/13 @ 1:14PM BY RUSCA.   Report Status 02/01/2013 FINAL   Final   Organism ID, Bacteria METHICILLIN RESISTANT STAPHYLOCOCCUS AUREUS   Final  CULTURE, BLOOD (ROUTINE X 2)     Status: None   Collection Time    01/29/13 12:45 AM      Result Value Range Status   Specimen Description BLOOD LEFT HAND   Final   Special Requests BOTTLES DRAWN AEROBIC ONLY 10CC   Final   Culture  Setup Time 01/29/2013 15:11   Final   Culture     Final   Value: STAPHYLOCOCCUS AUREUS     Note: SUSCEPTIBILITIES PERFORMED ON PREVIOUS CULTURE WITHIN THE LAST 5 DAYS.     Note: Gram Stain Report Called to,Read Back By and Verified With: BERNADETTE Ocshner [facility] 01/30/13 @ 1:14PM BY RUSCA.   Report Status 02/01/2013 FINAL   Final  URINE CULTURE     Status: None   Collection Time    01/29/13  1:56 AM      Result Value Range Status   Specimen Description URINE, RANDOM   Final   Special Requests NONE   Final   Culture  Setup Time 01/29/2013 15:24   Final   Colony Count  NO GROWTH   Final   Culture NO GROWTH   Final   Report Status 01/30/2013 FINAL   Final  MRSA PCR SCREENING     Status: None   Collection Time    01/29/13  5:30 AM      Result Value Range Status   MRSA by PCR NEGATIVE  NEGATIVE Final   Comment:            The GeneXpert MRSA Assay (FDA     approved for  NASAL specimens     only), is one component of a     comprehensive MRSA colonization     surveillance program. It is not     intended to diagnose MRSA     infection nor to guide or     monitor treatment for     MRSA infections.     Studies:  Recent x-ray studies have been reviewed in detail by the Attending Physician  Scheduled Meds:  Scheduled Meds: . ceFEPime (MAXIPIME) IV  2 g Intravenous Q M,W,F-2000  . cinacalcet  30 mg Oral BID WC  . collagenase   Topical Daily  . darbepoetin (ARANESP) injection - DIALYSIS  60 mcg Intravenous Q Mon-HD  . feeding supplement (NEPRO CARB STEADY)  237 mL Oral BID BM  . gabapentin  100 mg Oral Daily  . lanthanum  500 mg Oral TID WC  . midodrine  10 mg Oral TID WC  . multivitamin  1 tablet Oral Daily  . omega-3 acid ethyl esters  1 g Oral Daily  . sodium chloride  3 mL Intravenous Q12H  . vancomycin  125 mg Oral Daily  . vancomycin  750 mg Intravenous Q M,W,F-HD    Time spent on care of this patient: 25 min   Calvert Cantor, MD   [facility] Office  [phone] Pager - Text Page per Loretha Stapler as per below:  On-Call/Text Page:      [email or web address]      password [email or web address]  If 7PM-7AM, please contact night-coverage [email or web address] Password [email or web address] 02/01/2013, 2:26 PM   LOS: 3 days

## 2013-02-01 NOTE — Consult Note (Signed)
Patient [name]      DOB: September 03, 1930      WUJ:[id number]     Consult Note from the Palliative Medicine Team at [facility]    Consult Requested by: Dr Sharon Seller     PCP: Crawford Givens, MD Reason for Consultation:Goals of care     Phone Number:[phone]  Assessment of patients Current state: Patient more alert today, able to answer simple questions regarding comfort, but disoriented to place and time. Per staff patient is eating at best 50% of 1 meal per day, with bites from other 2 meals. Admits to pain in sacral area. Patient does not appear to have insight and cannot verbalize his understanding of the medical issues he is being treated for.  Reviewed chart, spoke with staff, proceeded to have Goals of care meeting with patients spouse, [name], his son, [name]) and patients daughter, [name]. Family had met with PMT during patients recent admission in May to discuss goals of care , at that time they wanted to continue dialysis for the patient as well as limited medical interventions.  Discussed with family patients current medical issues, continuing hypotension, poor nutrition/malnourishment, and wound sepsis infection. We also discussed decline in patients quality of life since March. I informed family of measures recommended by ID if aggressive management of current systemic infection would be desired, which would require ongoing lab draws, central IV line placement and prolonged administration of IV antibiotic therapy. We also spoke about ongoing future admissions to hospital due to extent of wounds that would not heal with poor nutritional status.  Family verbalized that they feel patient is suffering with ongoing aggressive medical management. We talked about making decisions to ensure patient a comfortable death, and also talked about the role of palliative care services and hospice support in a residential setting if choice to discontinue dialysis is made.  Son,  daughter and wife verbalized that they feel de-escalating care (incluiding discontinuing dialysis) would be the appropriate decision to make. They realize that patient would pass away in 1-2 weeks without ongoing dialysis. They would like to talk it over and are willing to re-meet with palliative Care 02/01/13 at 2:00pm.    Goals of Care: 1.  Code Status:DNR/DNI   Continue current level of medical interventions until re-met discussion with PMT on 02/01/13 at 2pm   2. Scope of Treatment: 1. Vital Signs: per unit routine  2. Respiratory/Oxygen: support as indicated 3. Nutritional Support/Tube Feeds: No artificial means of nutritional support 4. Antibiotics: continue current level of support 5. Blood Products: as indicated 6. IVF: as indicated 7. Review of Medications to be discontinued: none 8. Labs/CBG monitoring: as indicated 9. Telemetry: Yes 10. Continue HD for now   4. Disposition: pending until re-meet with family 02/01/13 at 2pm   3. Symptom Management:   1. Pain: Oxycodone 5-10 mg oral every 3 hours as needed for pain  4. Psychosocial: Patient lived at home with spouse, and was fairly independent until 10/2012-during which time he was admitted for syncopal episode and discharged to [facility] for rehabilitation. Per family patient has done poorly since, has not ambulated since March, requires total assistance for ADL's, and has become more confused.  5. Spiritual: consult placed   Patient Documents Completed or Given: Document Given Completed  Advanced Directives Pkt    MOST  No  DNR  Yes  Gone from My Sight    Hard Choices  None available    Brief HPI: Patient is a 79 yo  AAM with ESRD, that has been receiving hemodialysis for the past 5 years. Recently admitted 12/2012 for C-Diff sepsis. Sent back to ER from [facility] with hypotension, patient was febrile and anemic, required fluid resuscitation, transfusion and was empirically initiated on IV  antibiotics. Patient has multiple including a Stage 4 sacral decubiti (palpable to the bone),  Blood cultures revealed MRSA bacteremia and probable osteomyelitis.    ROS: does admit to pain in sacral area, unable to elicit full ROS due to patients confusion    PMH:  Past Medical History  Diagnosis Date  . Hypertension   . Diabetes mellitus     typeII / No meds  . Cataract 01/2002    Right  . Dialysis patient 10/13/2007  . Radiculopathy 05/14/2006    NCV study negative carpal tunnel, Left C5 radiculopathy  . Arthritis     Knee pain  . ESRD (end stage renal disease) on dialysis 2009    [facility] diaylsis Monday , Wed. and Friday per Dr. Kathrene Bongo  . Stroke   . Elevated PSA     h/o, pt had declined further eval.   . Syncope 11/02/2012     PSH: Past Surgical History  Procedure Laterality Date  . Varicose vein surgery  1978  . External fixation wrist fracture  1990  . Thyroidectomy, partial  08/03/2003    Right thyroid lobectomy, subtotal parathyr. sec hyperparthyroidismadenosis  . Av fistula placement  07/2004    Left arm AV fistula placement [name] via [place])  . Eye surgery      Cataract   I have reviewed the FH and SH and  If appropriate update it with new information. No Known Allergies Scheduled Meds: . ceFEPime (MAXIPIME) IV  2 g Intravenous Q M,W,F-2000  . cinacalcet  30 mg Oral BID WC  . collagenase   Topical Daily  . darbepoetin (ARANESP) injection - DIALYSIS  60 mcg Intravenous Q Mon-HD  . feeding supplement (NEPRO CARB STEADY)  237 mL Oral BID BM  . gabapentin  100 mg Oral Daily  . lanthanum  500 mg Oral TID WC  . midodrine  10 mg Oral TID WC  . multivitamin  1 tablet Oral Daily  . omega-3 acid ethyl esters  1 g Oral Daily  . sodium chloride  3 mL Intravenous Q12H  . vancomycin  125 mg Oral Daily  . vancomycin  750 mg Intravenous Q M,W,F-HD   Continuous Infusions:  PRN Meds:.acetaminophen, acetaminophen, morphine injection,  ondansetron (ZOFRAN) IV, ondansetron, oxyCODONE    BP 100/65  Pulse 108  Temp(Src) 98.9 F (37.2 C) (Axillary)  Resp 27  Ht 6\' 6"  (1.981 m)  Wt 84.9 kg (187 lb 2.7 oz)  BMI 21.63 kg/m2  SpO2 96%   PPS: 30%          01/29/13 Albumin 2.0   Intake/Output Summary (Last 24 hours) at 02/01/13 1518 Last data filed at 02/01/13 1151  Gross per 24 hour  Intake    290 ml  Output    230 ml  Net     60 ml   LBM: 02/01/13                     Physical Exam:  General: Alert, answers  HEENT:  Buccal mucosa moist free of lesions Chest:  CTA bilaterally CVS: tachycardic  Abdomen: soft, non-tender, BS audible Ext: decubuti on heels bilaterally, trace pedal edema Neuro: pleasantly confused  Labs: CBC    Component Value  Date/Time   WBC 18.8* 02/01/2013 0510   RBC 3.31* 02/01/2013 0510   HGB 8.9* 02/01/2013 0510   HCT 27.2* 02/01/2013 0510   PLT 451* 02/01/2013 0510   MCV 82.2 02/01/2013 0510   MCH 26.9 02/01/2013 0510   MCHC 32.7 02/01/2013 0510   RDW 16.7* 02/01/2013 0510   LYMPHSABS 1.8 01/29/2013 0848   MONOABS 2.1* 01/29/2013 0848   EOSABS 0.3 01/29/2013 0848   BASOSABS 0.3* 01/29/2013 0848    BMET    Component Value Date/Time   NA 132* 01/30/2013 0830   K 4.5 01/30/2013 0830   CL 95* 01/30/2013 0830   CO2 25 01/30/2013 0830   GLUCOSE 118* 01/30/2013 0830   BUN 52* 01/30/2013 0830   CREATININE 5.04* 01/30/2013 0830   CALCIUM 8.9 01/30/2013 0830   CALCIUM 9.9 09/24/2007 1113   GFRNONAA 10* 01/30/2013 0830   GFRAA 11* 01/30/2013 0830    CMP     Component Value Date/Time   NA 132* 01/30/2013 0830   K 4.5 01/30/2013 0830   CL 95* 01/30/2013 0830   CO2 25 01/30/2013 0830   GLUCOSE 118* 01/30/2013 0830   BUN 52* 01/30/2013 0830   CREATININE 5.04* 01/30/2013 0830   CALCIUM 8.9 01/30/2013 0830   CALCIUM 9.9 09/24/2007 1113   PROT 7.4 01/29/2013 0848   ALBUMIN 2.0* 01/29/2013 0848   AST 45* 01/29/2013 0848   ALT 35 01/29/2013 0848   ALKPHOS 71 01/29/2013 0848   BILITOT 0.4 01/29/2013 0848    GFRNONAA 10* 01/30/2013 0830   GFRAA 11* 01/30/2013 0830    Chest Xray Reviewed/Impressions:01/29/13 IMPRESSION:  Bronchitic changes with minimal right basilar atelectasis.    Time In Time Out Total Time Spent with Patient Total Overall Time  11:30a 1:30p 30 min 100 min    Greater than 50%  of this time was spent counseling and coordinating care related to the above assessment and plan.   Freddie Breech, CNS-C Palliative Medicine Team [name] Team Phone: [phone] Pager: [phone]

## 2013-02-02 DIAGNOSIS — E1129 Type 2 diabetes mellitus with other diabetic kidney complication: Secondary | ICD-10-CM

## 2013-02-02 LAB — CBC
MCH: 27.1 pg (ref 26.0–34.0)
MCV: 81.7 fL (ref 78.0–100.0)
Platelets: 489 10*3/uL — ABNORMAL HIGH (ref 150–400)
RDW: 16.6 % — ABNORMAL HIGH (ref 11.5–15.5)

## 2013-02-02 LAB — RENAL FUNCTION PANEL
Albumin: 1.8 g/dL — ABNORMAL LOW (ref 3.5–5.2)
BUN: 44 mg/dL — ABNORMAL HIGH (ref 6–23)
Calcium: 9.1 mg/dL (ref 8.4–10.5)
Creatinine, Ser: 5.56 mg/dL — ABNORMAL HIGH (ref 0.50–1.35)
GFR calc non Af Amer: 9 mL/min — ABNORMAL LOW (ref >90)
Phosphorus: 5 mg/dL — ABNORMAL HIGH (ref 2.3–4.6)

## 2013-02-02 LAB — VANCOMYCIN, RANDOM: Vancomycin Rm: 18 ug/mL

## 2013-02-02 MED ORDER — HEPARIN SODIUM (PORCINE) 1000 UNIT/ML IJ SOLN
1000.0000 [IU] | Freq: Once | INTRAMUSCULAR | Status: AC
  Administered 2013-02-02: 1000 [IU] via INTRAVENOUS

## 2013-02-02 MED ORDER — DARBEPOETIN ALFA-POLYSORBATE 25 MCG/0.42ML IJ SOLN
INTRAMUSCULAR | Status: AC
  Filled 2013-02-02: qty 0.42

## 2013-02-02 MED ORDER — DARBEPOETIN ALFA-POLYSORBATE 60 MCG/0.3ML IJ SOLN
INTRAMUSCULAR | Status: AC
  Administered 2013-02-02: 10:00:00
  Filled 2013-02-02: qty 0.3

## 2013-02-02 MED ORDER — DARBEPOETIN ALFA-POLYSORBATE 60 MCG/0.3ML IJ SOLN
INTRAMUSCULAR | Status: AC
  Filled 2013-02-02: qty 0.3

## 2013-02-02 MED ORDER — CEFAZOLIN SODIUM-DEXTROSE 2-3 GM-% IV SOLR
2.0000 g | INTRAVENOUS | Status: DC
  Administered 2013-02-04 – 2013-02-14 (×5): 2 g via INTRAVENOUS
  Filled 2013-02-02 (×10): qty 50

## 2013-02-02 MED ORDER — METRONIDAZOLE 500 MG PO TABS: 500.0000 mg | ORAL_TABLET | Freq: Three times a day (TID) | ORAL | Status: DC

## 2013-02-02 NOTE — Progress Notes (Signed)
[facility] Progress Note  Subjective:   "At dialysis"  Denies pain. When I asked what about his bottom  (sacral decub) - he said" you must have seen the sign"  Objective Filed Vitals:   02/01/13 1600 02/01/13 2020 02/01/13 2107 02/02/13 0602  BP: 113/65 115/62 118/52 111/63  Pulse: 108 103 100 90  Temp: 98.3 F (36.8 C) 98 F (36.7 C) 98 F (36.7 C) 97.6 F (36.4 C)  TempSrc: Axillary Oral Oral Oral  Resp: 27 25 24 22   Height:      Weight:   85.821 kg (189 lb 3.2 oz)   SpO2: 100% 98% 94% 99%   Physical Exam General:alert, pleasantly confused Heart: tachy reg Lungs: no wheezes or rales Abdomen: soft NT Extremities: tr LE edema Dialysis Access: left upper AVF - Hd being initiated.  Outpatient HD: MWF [facility] 4hrs F180 500/A1.5 EDW 77.5kg 2K/2Ca [place] LUA AVF EPO 6200 Heparin 5000   Assessment/Plan: 1. MRSA bacteremia/ treat as presumed osteo per ID - on Vanc x 6 weeks and cefepime -  2. ESRD - MWF -HD today 3. Anemia - s/p transfusion Hgb equilibrating at 8.9; Aranesp 60 4. Secondary hyperparathyroidism - on sensipar and binders; needs P checked with dialysis today 5. HTN/volume -BP  controlled, volume up some 6. Nutrition - Alb 2; renal diet; suppl nepro bid add prostat 7. Sacral decub - local care; nutrition 8. Hx c diff +  5/19 - on oral Vanc 9. EOL issues - family conf was scheduled yesterday am and were to remeet at 2 pm - per incomplete palliative care note; outcome pending; prognosis for recovery very poor; prognosis for prolonged suffering is great; he would need to be able to dialyze in a recliner for 4 hrs plus transport time in order to be discharged.   Sheffield Slider, PA-C [facility] Beeper [phone] 02/02/2013,6:57 AM  LOS: 4 days   I have seen and examined this patient and agree with plan per Bard Herbert.  Ap 210 Vp 130.  Family seems to be leaning towards DC of HD per palliative care notes Eros Montour  T,MD 02/02/2013 9:33 AM  Additional Objective Labs: Basic Metabolic Panel:  Recent Labs Lab 01/29/13 0015 01/29/13 0118 01/29/13 0848 01/30/13 0830  NA 142 145 137 132*  K 2.2* 2.8* 4.2 4.5  CL 113* 112 99 95*  CO2 20  --  27 25  GLUCOSE 80 72 134* 118*  BUN 25* 23 38* 52*  CREATININE 2.24* 2.70* 3.67* 5.04*  CALCIUM 5.9*  --  9.1 8.9   Liver Function Tests:  Recent Labs Lab 01/29/13 0015 01/29/13 0848  AST 31 45*  ALT 25 35  ALKPHOS 43 71  BILITOT 0.2* 0.4  PROT 5.1* 7.4  ALBUMIN 1.4* 2.0*    Recent Labs Lab 01/29/13 0015  LIPASE 41   CBC:  Recent Labs Lab 01/29/13 0015 01/29/13 0118 01/29/13 0848 02/01/13 0510  WBC 18.7*  --  25.7* 18.8*  NEUTROABS 16.8*  --  21.2*  --   HGB 6.4* 7.1* 10.1* 8.9*  HCT 19.2* 21.0* 31.4* 27.2*  MCV 83.1  --  84.4 82.2  PLT 382  --  460* 451*  CBG:  Recent Labs Lab 01/29/13 0217  GLUCAP 124*   IrMedications:   . ceFEPime (MAXIPIME) IV  2 g Intravenous Q M,W,F-2000  . cinacalcet  30 mg Oral BID WC  . collagenase   Topical Daily  . darbepoetin (ARANESP) injection - DIALYSIS  60  mcg Intravenous Q Mon-HD  . feeding supplement (NEPRO CARB STEADY)  237 mL Oral BID BM  . gabapentin  100 mg Oral Daily  . lanthanum  500 mg Oral TID WC  . midodrine  10 mg Oral TID WC  . multivitamin  1 tablet Oral Daily  . omega-3 acid ethyl esters  1 g Oral Daily  . sodium chloride  3 mL Intravenous Q12H  . vancomycin  125 mg Oral Daily  . vancomycin  750 mg Intravenous Q M,W,F-HD

## 2013-02-02 NOTE — Progress Notes (Signed)
[facility]    Date of Admission:  01/29/2013   Total days of antibiotics 4        Day 4 vanco        Day 4 cefepime        Day 2 po vanco   ID: [name] is a 80 y.o. male  with ESRD on HD, stage 4 sacral decub/probable osteomyelitis presents with sepsis found to have MRSA bacteremia and worsening decub ulcers/osteomyelitis.  Principal Problem:   Sepsis Active Problems:   End stage renal disease   Anemia of chronic disease   Decubitus ulcer of sacral region, stage 4   Protein-calorie malnutrition, severe   Pain in wound    Subjective: Underwent Hd without complications. Had family goals of care meeting and want to pursue antibiotics, not comfort at this time.  Medications:  . [START ON 02/04/2013]  ceFAZolin (ANCEF) IV  2 g Intravenous Q M,W,F-HD  . cinacalcet  30 mg Oral BID WC  . collagenase   Topical Daily  . darbepoetin (ARANESP) injection - DIALYSIS  60 mcg Intravenous Q Mon-HD  . feeding supplement (NEPRO CARB STEADY)  237 mL Oral BID BM  . gabapentin  100 mg Oral Daily  . lanthanum  500 mg Oral TID WC  . midodrine  10 mg Oral TID WC  . multivitamin  1 tablet Oral Daily  . omega-3 acid ethyl esters  1 g Oral Daily  . sodium chloride  3 mL Intravenous Q12H  . vancomycin  125 mg Oral Daily  . vancomycin  750 mg Intravenous Q M,W,F-HD    Objective: Vital signs in last 24 hours: Temp:  [97.5 F (36.4 C)-98.4 F (36.9 C)] 98.4 F (36.9 C) (06/25 2103) Pulse Rate:  [87-98] 98 (06/25 2103) Resp:  [14-22] 18 (06/25 2103) BP: (78-111)/(46-71) 101/46 mmHg (06/25 2103) SpO2:  [97 %-99 %] 99 % (06/25 2103) Weight:  [189 lb 13.1 oz (86.1 kg)-192 lb 10.9 oz (87.4 kg)] 189 lb 13.1 oz (86.1 kg) (06/25 1114)   General: No acute respiratory distress, frail appearing, in mild discomfort from wound change  Lungs: Clear to auscultation bilaterally without wheezes or crackles  Cardiovascular: Tachycardic but regular without appreciable murmur  or gallop  Abdomen: Nontender, nondistended, soft, bowel sounds positive, no rebound, no ascites, no appreciable mass  Extremities: No significant cyanosis, clubbing, or edema bilateral lower extremities  Skin: large stage 4 decubitus ulcer with significant amount of necrotic tissue, palpable bone at wound bed. Decub ulcers on heels are covered  Lab Results  Recent Labs  02/01/13 0510 02/02/13 0704  WBC 18.8* 18.0*  HGB 8.9* 8.6*  HCT 27.2* 25.9*  NA  --  133*  K  --  4.3  CL  --  94*  CO2  --  25  BUN  --  44*  CREATININE  --  5.56*   Liver Panel  Recent Labs  02/02/13 0704  ALBUMIN 1.8*    Microbiology: 6/21 blood cx: MRSA  6/21 urine cx : NGTD  5/20 cdiff POSITIVE  Studies/Results: No results found.   Assessment/Plan: 79 yo M with ESRD on HD, stage 4 sacral decub/probable osteomyelitis presents with sepsis found to have MRSA bacteremia and worsening decub ulcers/osteomyelitis.  - continue with vancomycin  For mrsa bacteremia, will get repeat blood cutlures to document clearance and TTE to evaluate for vegetations. He would be getting at least 4 wks of vanco but more likely 6 wk for complicated  bacteremia leading to osteomyelitis - presumed osteomyelitis:since he has decided to pursue continuing with antibiotics, he will need to vancomycin x 6 wks in addn to cefazolin plus oral metronidazole 500mg  Q 8hr. If stool is in the wound bed, consider evaluating for diverting colostomy. - c.difficile infection, resolved,=  Would be treated with metronidazole if it recurs  Drue Second [facility] Cell: [phone] Pager: [phone]  02/02/2013, 9:13 PM

## 2013-02-02 NOTE — Progress Notes (Signed)
Pt does not have prevalon boots at bedside. Per 2600, no belongings left on unit. [facility], per Financial risk analyst states pt son/family picked up all belongings yesterdary. Pt son now at bedside states that boots were "raggedy" and barely working. Will order new set. Will continue to monitor.

## 2013-02-02 NOTE — Progress Notes (Signed)
Pt noted to have old pocketed pills in cheeks. Pt with difficulty/inability to swallow pills even with repeated commands. Will continue to monitor.

## 2013-02-02 NOTE — Progress Notes (Signed)
ANTIBIOTIC CONSULT NOTE - FOLLOW UP  Pharmacy Consult for vancomycin Indication: presumed osteo and mrsa bacteremia   No Known Allergies  Patient Measurements: Height: 6\' 6"  (198.1 cm) Weight: 189 lb 13.1 oz (86.1 kg) IBW/kg (Calculated) : 91.4   Vital Signs: Temp: 98.2 F (36.8 C) (06/25 1114) Temp src: Oral (06/25 1114) BP: 106/62 mmHg (06/25 1117) Pulse Rate: 91 (06/25 1117) Intake/Output from previous day: 06/24 0701 - 06/25 0700 In: -  Out: 165 [Urine:165] Intake/Output from this shift: Total I/O In: -  Out: 1000 [Other:1000]  Labs:  Recent Labs  02/01/13 0510 02/02/13 0704  WBC 18.8* 18.0*  HGB 8.9* 8.6*  PLT 451* 489*  CREATININE  --  5.56*   Estimated Creatinine Clearance: 12.9 ml/min (by C-G formula based on Cr of 5.56). No results found for this basename: VANCOTROUGH, VANCOPEAK, VANCORANDOM, GENTTROUGH, GENTPEAK, GENTRANDOM, TOBRATROUGH, TOBRAPEAK, TOBRARND, AMIKACINPEAK, AMIKACINTROU, AMIKACIN,  in the last 72 hours   Microbiology: Recent Results (from the past 720 hour(s))  CULTURE, BLOOD (ROUTINE X 2)     Status: None   Collection Time    01/29/13 12:30 AM      Result Value Range Status   Specimen Description BLOOD RIGHT ARM   Final   Special Requests BOTTLES DRAWN AEROBIC AND ANAEROBIC 10CC EACH   Final   Culture  Setup Time 01/29/2013 15:10   Final   Culture     Final   Value: METHICILLIN RESISTANT STAPHYLOCOCCUS AUREUS     Note: RIFAMPIN AND GENTAMICIN SHOULD NOT BE USED AS SINGLE DRUGS FOR TREATMENT OF STAPH INFECTIONS. CRITICAL RESULT CALLED TO, READ BACK BY AND VERIFIED WITH: ANNA AGUILAR 01/31/13 1440 BY SMITHERSJ     Note: Gram Stain Report Called to,Read Back By and Verified With: BERNADETTE LOMCALLO 01/30/13 @ 1:14PM BY RUSCA.   Report Status 02/01/2013 FINAL   Final   Organism ID, Bacteria METHICILLIN RESISTANT STAPHYLOCOCCUS AUREUS   Final  CULTURE, BLOOD (ROUTINE X 2)     Status: None   Collection Time    01/29/13 12:45 AM   Result Value Range Status   Specimen Description BLOOD LEFT HAND   Final   Special Requests BOTTLES DRAWN AEROBIC ONLY 10CC   Final   Culture  Setup Time 01/29/2013 15:11   Final   Culture     Final   Value: STAPHYLOCOCCUS AUREUS     Note: SUSCEPTIBILITIES PERFORMED ON PREVIOUS CULTURE WITHIN THE LAST 5 DAYS.     Note: Gram Stain Report Called to,Read Back By and Verified With: BERNADETTE [facility] 01/30/13 @ 1:14PM BY RUSCA.   Report Status 02/01/2013 FINAL   Final  URINE CULTURE     Status: None   Collection Time    01/29/13  1:56 AM      Result Value Range Status   Specimen Description URINE, RANDOM   Final   Special Requests NONE   Final   Culture  Setup Time 01/29/2013 15:24   Final   Colony Count NO GROWTH   Final   Culture NO GROWTH   Final   Report Status 01/30/2013 FINAL   Final  MRSA PCR SCREENING     Status: None   Collection Time    01/29/13  5:30 AM      Result Value Range Status   MRSA by PCR NEGATIVE  NEGATIVE Final   Comment:            The GeneXpert MRSA Assay (FDA     approved for NASAL specimens  only), is one component of a     comprehensive MRSA colonization     surveillance program. It is not     intended to diagnose MRSA     infection nor to guide or     monitor treatment for     MRSA infections.    Anti-infectives   Start     Dose/Rate Route Frequency Ordered Stop   01/31/13 2200  vancomycin (VANCOCIN) 50 mg/mL oral solution 125 mg     125 mg Oral Daily 01/31/13 2116     01/31/13 2000  ceFEPIme (MAXIPIME) 2 g in dextrose 5 % 50 mL IVPB  Status:  Discontinued     2 g 100 mL/hr over 30 Minutes Intravenous Every M-W-F (2000) 01/29/13 0546 02/02/13 1032   01/31/13 1200  vancomycin (VANCOCIN) IVPB 750 mg/150 ml premix     750 mg 150 mL/hr over 60 Minutes Intravenous Every M-W-F (Hemodialysis) 01/29/13 0546     01/29/13 0600  vancomycin (VANCOCIN) IVPB 750 mg/150 ml premix     750 mg 150 mL/hr over 60 Minutes Intravenous  Once 01/29/13 0545 01/29/13  0958   01/29/13 0600  ceFEPIme (MAXIPIME) 2 g in dextrose 5 % 50 mL IVPB     2 g 100 mL/hr over 30 Minutes Intravenous  Once 01/29/13 0546 01/29/13 0909   01/29/13 0045  vancomycin (VANCOCIN) IVPB 1000 mg/200 mL premix     1,000 mg 200 mL/hr over 60 Minutes Intravenous  Once 01/29/13 0033 01/29/13 0226   01/29/13 0045  piperacillin-tazobactam (ZOSYN) IVPB 2.25 g     2.25 g 100 mL/hr over 30 Minutes Intravenous  Once 01/29/13 0033 01/29/13 0333   01/29/13 0045  gentamicin (GARAMYCIN) IVPB 100 mg     100 mg 200 mL/hr over 30 Minutes Intravenous  Once 01/29/13 0043 01/29/13 0442      Assessment: Patient is an 79 y.o M on abx for presumed osteo and MRSA bacteremia.  Awaiting family's decision regarding goal of care to determine course for abx.  Patient went to HD today but dialysis nurse missed order for vancomycin level so it was not drawn.   Goal of Therapy:  Pre-HD vancomycin  Level=15-25  Plan:  1) will f/u with family's decision regarding goal of care 2) will plan on getting vancomycin level with next HD session if to cont with vancomycin  Donyea Beverlin P 02/02/2013,12:49 PM

## 2013-02-02 NOTE — Progress Notes (Addendum)
[facility] Progress Note [place] TEAM 1 - Stepdown/ICU TEAM   [name] ZOX:[id number] DOB: 06-26-1931 DOA: 01/29/2013 PCP: Crawford Givens, MD  Brief narrative: 79 y.o. male history of ESRD on hemodialysis who was recently admitted (5/19 - 01/10/2013) for sepsis secondary to C. difficile colitis brought back to the ER from nursing home 6/21 after patient was found to be hypotensive. On arrival patient also was found to be febrile, tachycardic, and hemoglobin low from his baseline. Patient was started on empiric antibiotics after blood cultures were obtained. Patient's sacral decubitus had a discharge on exam at presentation. Patient was found to be hypotensive and was given 1 L normal saline bolus and was started on empiric antibiotics along with PRBC transfusion. Patient's family had a detailed discussion with ER physician Dr. Arnoldo Morale and they requested to continue with DO NOT RESUSCITATE status and no vasopressors. They discussed the possibility of moving towards comfort measures.   Assessment/Plan:  MRSA Sepsis / leukocytosis - cont current antibiotics - vanc and ancef x 6 weeks - with dialysis - suspected source of entry is sacra decubitus - still hypotensive but fevers resolved  - blood cx both + for MRSA (2/2) - repeat cultures recs per ID:  if family decides to pursue more aggressive measures, recommend he gets repeat blood culltures on 6/24 (done) to document clearance of bacteremia, and TTE to evaluate for vegetations ( ordered) - presumed osteomyelitis: if they decide to pursue continuing with antibiotics, he will need to vancomycin x 6 wks in addn to gram negative and anaerobic coverage.  - c.difficile infection, continue with enteric precautions as well as oral vancomycin 125mg  daily, as part of taper to that he does not have recurrent c.difficile infection due to antibiotics for MRSA bacteremia and osteomyelitis. If having worsening stool output, would increase oral vanco  to QID  Decubitus ulcer of sacral region, stage 4 - wound care consult performed - wounds are felt to be beyond conservative tx at this time - if aggressive care is to continue, Gen Surg consult with extensive operative debridement will likely be the only option - pt has not been able to tolerate hydrotherapy previously due to severe pain - Palliative Care consult requested to establish GOC with family - they wish to continue on  End stage renal disease - cont dialysis per Nephrology for now   Anemia of chronic disease - stable  Protein-calorie malnutrition, severe - nutritional supplements  DM CBG reasonably controlled at present time  Hx of HTN  Hx of CVA with suspected vascular dementia  Decubitus ulcer of R heel  Severe protein calorie malnutrition  Code Status: DNR Family Communication: No family present at time of exam Disposition Plan:  Consultants: Nephro Palliative Care  Procedures: none  Antibiotics: Vanc 6/20 >> Ancef 6/25  DVT prophylaxis: SCDs + SQ heparin   HPI/Subjective: C/o being tired   Objective: Blood pressure 106/62, pulse 91, temperature 98.2 F (36.8 C), temperature source Oral, resp. rate 16, height 6\' 6"  (1.981 m), weight 86.1 kg (189 lb 13.1 oz), SpO2 98.00%.  Intake/Output Summary (Last 24 hours) at 02/02/13 1127 Last data filed at 02/02/13 1117  Gross per 24 hour  Intake      0 ml  Output   1065 ml  Net  -1065 ml   Exam: General: No acute respiratory distress Lungs: Clear to auscultation bilaterally without wheezes or crackles Cardiovascular: Tachycardic but regular without appreciable murmur or gallop Abdomen: Nontender, nondistended, soft, bowel sounds positive, no rebound,  no ascites, no appreciable mass Extremities: No significant cyanosis, clubbing, or edema bilateral lower extremities Skin: large stage 4 decubitus ulcer with significant amount of necrotic tissue  Data Reviewed: Basic Metabolic Panel:  Recent  Labs Lab 01/29/13 0015 01/29/13 0118 01/29/13 0130 01/29/13 0848 01/30/13 0830 02/02/13 0704  NA 142 145  --  137 132* 133*  K 2.2* 2.8*  --  4.2 4.5 4.3  CL 113* 112  --  99 95* 94*  CO2 20  --   --  27 25 25   GLUCOSE 80 72  --  134* 118* 88  BUN 25* 23  --  38* 52* 44*  CREATININE 2.24* 2.70*  --  3.67* 5.04* 5.56*  CALCIUM 5.9*  --   --  9.1 8.9 9.1  MG  --   --  1.4*  --   --   --   PHOS  --   --   --   --   --  5.0*   Liver Function Tests:  Recent Labs Lab 01/29/13 0015 01/29/13 0848 02/02/13 0704  AST 31 45*  --   ALT 25 35  --   ALKPHOS 43 71  --   BILITOT 0.2* 0.4  --   PROT 5.1* 7.4  --   ALBUMIN 1.4* 2.0* 1.8*    Recent Labs Lab 01/29/13 0015  LIPASE 41   CBC:  Recent Labs Lab 01/29/13 0015 01/29/13 0118 01/29/13 0848 02/01/13 0510 02/02/13 0704  WBC 18.7*  --  25.7* 18.8* 18.0*  NEUTROABS 16.8*  --  21.2*  --   --   HGB 6.4* 7.1* 10.1* 8.9* 8.6*  HCT 19.2* 21.0* 31.4* 27.2* 25.9*  MCV 83.1  --  84.4 82.2 81.7  PLT 382  --  460* 451* 489*   CBG:  Recent Labs Lab 01/29/13 0217  GLUCAP 124*    Recent Results (from the past 240 hour(s))  CULTURE, BLOOD (ROUTINE X 2)     Status: None   Collection Time    01/29/13 12:30 AM      Result Value Range Status   Specimen Description BLOOD RIGHT ARM   Final   Special Requests BOTTLES DRAWN AEROBIC AND ANAEROBIC 10CC EACH   Final   Culture  Setup Time 01/29/2013 15:10   Final   Culture     Final   Value: METHICILLIN RESISTANT STAPHYLOCOCCUS AUREUS     Note: RIFAMPIN AND GENTAMICIN SHOULD NOT BE USED AS SINGLE DRUGS FOR TREATMENT OF STAPH INFECTIONS. CRITICAL RESULT CALLED TO, READ BACK BY AND VERIFIED WITH: ANNA AGUILAR 01/31/13 1440 BY SMITHERSJ     Note: Gram Stain Report Called to,Read Back By and Verified With: BERNADETTE LOMCALLO 01/30/13 @ 1:14PM BY RUSCA.   Report Status 02/01/2013 FINAL   Final   Organism ID, Bacteria METHICILLIN RESISTANT STAPHYLOCOCCUS AUREUS   Final  CULTURE, BLOOD  (ROUTINE X 2)     Status: None   Collection Time    01/29/13 12:45 AM      Result Value Range Status   Specimen Description BLOOD LEFT HAND   Final   Special Requests BOTTLES DRAWN AEROBIC ONLY 10CC   Final   Culture  Setup Time 01/29/2013 15:11   Final   Culture     Final   Value: STAPHYLOCOCCUS AUREUS     Note: SUSCEPTIBILITIES PERFORMED ON PREVIOUS CULTURE WITHIN THE LAST 5 DAYS.     Note: Gram Stain Report Called to,Read Back By and Verified With: BERNADETTE [facility] 01/30/13 @  1:14PM BY RUSCA.   Report Status 02/01/2013 FINAL   Final  URINE CULTURE     Status: None   Collection Time    01/29/13  1:56 AM      Result Value Range Status   Specimen Description URINE, RANDOM   Final   Special Requests NONE   Final   Culture  Setup Time 01/29/2013 15:24   Final   Colony Count NO GROWTH   Final   Culture NO GROWTH   Final   Report Status 01/30/2013 FINAL   Final  MRSA PCR SCREENING     Status: None   Collection Time    01/29/13  5:30 AM      Result Value Range Status   MRSA by PCR NEGATIVE  NEGATIVE Final   Comment:            The GeneXpert MRSA Assay (FDA     approved for NASAL specimens     only), is one component of a     comprehensive MRSA colonization     surveillance program. It is not     intended to diagnose MRSA     infection nor to guide or     monitor treatment for     MRSA infections.     Studies:  Recent x-ray studies have been reviewed in detail by the Attending Physician  Scheduled Meds:  Scheduled Meds: . cinacalcet  30 mg Oral BID WC  . collagenase   Topical Daily  . darbepoetin      . darbepoetin (ARANESP) injection - DIALYSIS  60 mcg Intravenous Q Mon-HD  . feeding supplement (NEPRO CARB STEADY)  237 mL Oral BID BM  . gabapentin  100 mg Oral Daily  . lanthanum  500 mg Oral TID WC  . midodrine  10 mg Oral TID WC  . multivitamin  1 tablet Oral Daily  . omega-3 acid ethyl esters  1 g Oral Daily  . sodium chloride  3 mL Intravenous Q12H  .  vancomycin  125 mg Oral Daily  . vancomycin  750 mg Intravenous Q M,W,F-HD    Time spent on care of this patient: 25 min   Kayler Rise, DO   [facility] [phone]   If 7PM-7AM, please contact night-coverage [email or web address] Password [facility] 02/02/2013, 11:27 AM   LOS: 4 days

## 2013-02-02 NOTE — Progress Notes (Signed)
Patient [name]      DOB: [birth year]      NWG:[id number]   Palliative Medicine Team at [facility] Progress Note    Subjective: Patient more alert/attentive today, verbally responsive, oriented to place and family, appetite good, s/p HD treatment today. Palliative Care re-met with family for decisions pertinent to Goals of Care. Had in depth conversation with patient about his quality of life and tolerance of aggressive medical interventions, such as blood draws, IV antibiotic therapy. Patient verbalized his awareness that with discontinuation of HD his life would be limited to days to weeks.      Goals of Care Decisions made are as follows:   Continue dialysis treatments, will re-evaluate decision to stop in future as status declines  Pursue aggressive diagnostic work-up and antibiotic therapy for treatment of bacteremia  Medical Orders for Scope of Treatment form discussed and completed, placed on chart with DNR/DNI form  Dr. Benjamine Mola notified of the above decisions, will coordinate plan of care with ID     Filed Vitals:   02/02/13 1230  BP: 108/71  Pulse: 87  Temp: 97.9 F (36.6 C)  Resp: 16   Physical exam:  General: Alert, sitting up in bed HEENT: buccal mucosa moist, no oral lesions CHEST: CTA bilaterally CVS: RRR ABD: soft, non-tender, BS audible EXT: R arm + edema, trace pedal edema bilaterally Skin: bilateral heel and LE decubiti, large Stage 4 sacral decubuti   Assessment and plan:  Patient with ESRD, on dialysis x 5 years, currently hospitalized for hypotension and MRSA bacteremia. Re-meet with patient/family regarding current Goals of Care today.Family in agreement to have Palliative Care services to follow at SNF-  1) Code Status: DNR/DNI 2) Pain: Oxycodone 5-10 mg oral every 3 hours as needed 3) Disposition recommend Palliative care services to follow upon disposition to SNF  Time In Time Out Total Time Spent with Patient Total Overall Time  3:00p  4:00p 60 min 70 min   Freddie Breech, CNS-C Palliative Medicine Team [facility] Team Phone: [phone] Pager: [phone]

## 2013-02-03 DIAGNOSIS — I519 Heart disease, unspecified: Secondary | ICD-10-CM

## 2013-02-03 LAB — CBC
HCT: 27.4 % — ABNORMAL LOW (ref 39.0–52.0)
MCHC: 31.4 g/dL (ref 30.0–36.0)
MCV: 83.8 fL (ref 78.0–100.0)
RDW: 17 % — ABNORMAL HIGH (ref 11.5–15.5)

## 2013-02-03 LAB — BASIC METABOLIC PANEL
BUN: 29 mg/dL — ABNORMAL HIGH (ref 6–23)
Creatinine, Ser: 3.75 mg/dL — ABNORMAL HIGH (ref 0.50–1.35)
GFR calc Af Amer: 16 mL/min — ABNORMAL LOW (ref >90)
GFR calc non Af Amer: 14 mL/min — ABNORMAL LOW (ref >90)

## 2013-02-03 MED ORDER — METRONIDAZOLE 500 MG PO TABS
500.0000 mg | ORAL_TABLET | Freq: Three times a day (TID) | ORAL | Status: DC
  Administered 2013-02-03 – 2013-02-16 (×36): 500 mg via ORAL
  Filled 2013-02-03 (×44): qty 1

## 2013-02-03 MED ORDER — PRO-STAT SUGAR FREE PO LIQD
30.0000 mL | Freq: Three times a day (TID) | ORAL | Status: DC
  Administered 2013-02-03 – 2013-02-16 (×32): 30 mL via ORAL
  Filled 2013-02-03 (×17): qty 30
  Filled 2013-02-03: qty 60
  Filled 2013-02-03 (×22): qty 30

## 2013-02-03 NOTE — Progress Notes (Signed)
[facility] Progress Note  Subjective:  " At [facility]" Wants to go home - gives me his home address - not the NH where he currently lives. Fed himself applesauce.  Objective Filed Vitals:   02/02/13 1230 02/02/13 1827 02/02/13 2103 02/03/13 0556  BP: 108/71 110/66 101/46 108/59  Pulse: 87 90 98 99  Temp: 97.9 F (36.6 C) 97.5 F (36.4 C) 98.4 F (36.9 C) 97.4 F (36.3 C)  TempSrc: Oral Oral Oral Oral  Resp: 16 16 18 18   Height:      Weight:      SpO2: 98% 98% 99% 98%   Physical Exam General: more alert today Heart: RRR Lungs: no wheezes or rales Abdomen: soft Extremities: tr LE edema; feet in soft boots  Dialysis Access: left upper AVF - Hd being initiated.   Outpatient HD: MWF [facility] 4hrs F180 500/A1.5 EDW 77.5kg 2K/2Ca Bath LUA AVF EPO 6200 Heparin 5000   Assessment/Plan:  1. MRSA bacteremia/ treat as presumed osteo per ID - on Vanc x 6 weeks and cefepime; still with moderate leukocytosis; for 2 D Echo per rec of ID 2. ESRD - MWF -family wishes to continue - see #9; therefore we will dialyze him in a recliner on Friday to see if he can tolerate this. 3. Anemia - s/p transfusion Hgb equilibrating at 8.6; Aranesp 60 q Monday  4. Secondary hyperparathyroidism - on sensipar and binders; P 5 5. Hypotension/volume -BP limiting volume removal  - on midodrine , UF only 1 L yesterday- - post weight 86.1; weights are significantly above outpt EDW?? Related to bed scales - try for more volume on Friday  6. Nutrition - Alb1.8; renal diet; suppl nepro bid add prostat  7. Sacral decub - local care; nutrition, antibioitcs 8. Hx c diff + 5/19 - on oral Vanc  9. EOL issues - family conf - plan is to continue HD and medical care; prognosis for recovery very poor; prognosis for prolonged suffering is great; he would need to be able to dialyze in a recliner for 4 hrs plus transport time in order to be discharged  Sheffield Slider, PA-C [facility] Beeper  [phone] 02/03/2013,8:48 AM  LOS: 5 days  I have seen and examined this patient and agree with plan per Bard Herbert.  Plan is to continue with HD per palliative care.  His prognosis is poor and I would hate to see HD keep him alive just to be miserable.  Plan HD in AM. Lenard Kampf T,MD 02/03/2013 9:10 AM  Additional Objective Labs: Basic Metabolic Panel:  Recent Labs Lab 01/30/13 0830 02/02/13 0704 02/03/13 0525  NA 132* 133* 136  K 4.5 4.3 3.9  CL 95* 94* 99  CO2 25 25 30   GLUCOSE 118* 88 96  BUN 52* 44* 29*  CREATININE 5.04* 5.56* 3.75*  CALCIUM 8.9 9.1 9.3  PHOS  --  5.0*  --    Liver Function Tests:  Recent Labs Lab 01/29/13 0015 01/29/13 0848 02/02/13 0704  AST 31 45*  --   ALT 25 35  --   ALKPHOS 43 71  --   BILITOT 0.2* 0.4  --   PROT 5.1* 7.4  --   ALBUMIN 1.4* 2.0* 1.8*    Recent Labs Lab 01/29/13 0015  LIPASE 41   CBC:  Recent Labs Lab 01/29/13 0015  01/29/13 0848 02/01/13 0510 02/02/13 0704 02/03/13 0525  WBC 18.7*  --  25.7* 18.8* 18.0* 16.0*  NEUTROABS 16.8*  --  21.2*  --   --   --   HGB 6.4*  < > 10.1* 8.9* 8.6* 8.6*  HCT 19.2*  < > 31.4* 27.2* 25.9* 27.4*  MCV 83.1  --  84.4 82.2 81.7 83.8  PLT 382  --  460* 451* 489* 493*  < > = values in this interval not displayed. Blood Culture    Component Value Date/Time   SDES URINE, RANDOM 01/29/2013 0156   SPECREQUEST NONE 01/29/2013 0156   CULT NO GROWTH 01/29/2013 0156   REPTSTATUS 01/30/2013 FINAL 01/29/2013 0156  CBG:  Recent Labs Lab 01/29/13 0217 02/02/13 1228  GLUCAP 124* 88   IMedications:   . [START ON 02/04/2013]  ceFAZolin (ANCEF) IV  2 g Intravenous Q M,W,F-HD  . cinacalcet  30 mg Oral BID WC  . collagenase   Topical Daily  . darbepoetin (ARANESP) injection - DIALYSIS  60 mcg Intravenous Q Mon-HD  . feeding supplement (NEPRO CARB STEADY)  237 mL Oral BID BM  . gabapentin  100 mg Oral Daily  . lanthanum  500 mg Oral TID WC  . midodrine  10 mg Oral TID WC  .  multivitamin  1 tablet Oral Daily  . omega-3 acid ethyl esters  1 g Oral Daily  . sodium chloride  3 mL Intravenous Q12H  . vancomycin  125 mg Oral Daily  . vancomycin  750 mg Intravenous Q M,W,F-HD

## 2013-02-03 NOTE — Progress Notes (Signed)
Chaplain Note: Pt asleep when I stopped by. No family at bedside.  Rutherford Nail [phone]

## 2013-02-03 NOTE — Progress Notes (Signed)
NUTRITION FOLLOW UP  DOCUMENTATION CODES  Per approved criteria   -Severe malnutrition in the context of chronic illness    Intervention:   1. If patient continues to struggle with swallowing/pocketing pills, recommend SLP evaluation to determine most appropriate diet texture and liquid consistency. 2. Continue Nepro Shake PO BID 3. Add 30 ml Prostat po TID, each supplement provides 100 kcal and 15 grams protein. 4. If aggressive nutrition interventions warranted - please consult RD. 5. RD to continue to follow nutrition care plan.  Nutrition Dx:   Increased nutrient needs related to HD, wound healing as evidenced by estimated nutrition needs.  Goal:   Oral intake with meals & supplements to meet >/= 90% of estimated nutrition needs.  Monitor:   PO & supplemental intake, weight, labs, I/O's  Assessment:   Patient with ESRD on hemodialysis who was recently admitted for sepsis secondary to C. difficile colitis was brought to the ER from nursing home after patient was found to be hypotensive. Sepsis likely also 2/2 sacral decubitus.  Meal intake is very poor, per Palliative Care not - pt is eating 50% of 1 meal daily and bites of the other meals. Family wishes to pursue ongoing aggressive medical treatment, however per GOC note - family does not desire artificial means of nutrition support.  Pt also noted to be pocketing pills yesterday afternoon. Remains on regular texture diet.  Per most recent labs, potassium is WNL and phosphorus is elevated at 5.0.  Weights remain significantly above outpatient dry weights.  Patient meets criteria for severe malnutrition in the context of chronic illness as evidenced by 17% wt loss in < 6 months and intake of < 75% x at least 1 month.   Height: Ht Readings from Last 1 Encounters:  01/29/13 6\' 6"  (1.981 m)    Weight Status:   Wt Readings from Last 1 Encounters:  02/02/13 189 lb 13.1 oz (86.1 kg)  EDW per renal is 77.5 kg - question  accuracy of current weight  Re-estimated needs:  Kcal: 2100 - 2300 Protein: 120 - 135 g Fluid: 1.2 liters  Skin:  Stage IV sacral pressure ulcer  unstageable R heel DTI to R and L ischium Stage II to L foot DTI to L heel ---> seen by WOC on 6/23  Diet Order: Renal 60-70; 1200 ml fluid restriction   Intake/Output Summary (Last 24 hours) at 02/03/13 1029 Last data filed at 02/03/13 0600  Gross per 24 hour  Intake    420 ml  Output   1551 ml  Net  -1131 ml    Last BM: 6/25   Labs:   Recent Labs Lab 01/29/13 0118 01/29/13 0130  01/30/13 0830 02/02/13 0704 02/03/13 0525  NA 145  --   < > 132* 133* 136  K 2.8*  --   < > 4.5 4.3 3.9  CL 112  --   < > 95* 94* 99  CO2  --   --   < > 25 25 30   BUN 23  --   < > 52* 44* 29*  CREATININE 2.70*  --   < > 5.04* 5.56* 3.75*  CALCIUM  --   --   < > 8.9 9.1 9.3  MG  --  1.4*  --   --   --   --   PHOS  --   --   --   --  5.0*  --   GLUCOSE 72  --   < > 118*  88 96  < > = values in this interval not displayed.  CBG (last 3)   Recent Labs  02/02/13 1228  GLUCAP 88    Scheduled Meds: . [START ON 02/04/2013]  ceFAZolin (ANCEF) IV  2 g Intravenous Q M,W,F-HD  . cinacalcet  30 mg Oral BID WC  . collagenase   Topical Daily  . darbepoetin (ARANESP) injection - DIALYSIS  60 mcg Intravenous Q Mon-HD  . feeding supplement (NEPRO CARB STEADY)  237 mL Oral BID BM  . gabapentin  100 mg Oral Daily  . lanthanum  500 mg Oral TID WC  . midodrine  10 mg Oral TID WC  . multivitamin  1 tablet Oral Daily  . omega-3 acid ethyl esters  1 g Oral Daily  . sodium chloride  3 mL Intravenous Q12H  . vancomycin  125 mg Oral Daily  . vancomycin  750 mg Intravenous Q M,W,F-HD    Continuous Infusions:   Jarold Motto MS, RD, LDN Pager: [phone] After-hours pager: [phone]

## 2013-02-03 NOTE — Progress Notes (Signed)
[facility] Progress Note [place] TEAM 1 - Stepdown/ICU TEAM   [name] ZOX:[id number] DOB: 1930/08/25 DOA: 01/29/2013 PCP: Crawford Givens, MD  Brief narrative: 79 y.o. male history of ESRD on hemodialysis who was recently admitted (5/19 - 01/10/2013) for sepsis secondary to C. difficile colitis brought back to the ER from nursing home 6/21 after patient was found to be hypotensive. On arrival patient also was found to be febrile, tachycardic, and hemoglobin low from his baseline. Patient was started on empiric antibiotics after blood cultures were obtained. Patient's sacral decubitus had a discharge on exam at presentation. Patient was found to be hypotensive and was given 1 L normal saline bolus and was started on empiric antibiotics along with PRBC transfusion. Patient's family had a detailed discussion with ER physician Dr. Arnoldo Morale and they requested to continue with DO NOT RESUSCITATE status and no vasopressors. They discussed the possibility of moving towards comfort measures.   Assessment/Plan:  MRSA Sepsis / leukocytosis - cont current antibiotics - vanc and ancef x 6 weeks - with dialysis - suspected source of entry is sacral decubitus - still hypotensive but fevers resolved  - blood cx both + for MRSA (2/2) - repeat cultures 6/25 recs per ID:   TTE to evaluate for vegetations ( ordered) - presumed osteomyelitis:  he will need to vancomycin x 6 wks in addn to gram negative (ancef) and flagyl  - c.difficile infection- resolved- giving flagyl  Decubitus ulcer of sacral region, stage 4 - wound care consult performed - wounds are felt to be beyond conservative tx at this time - if aggressive care is to continue, Gen Surg consult with extensive operative debridement will likely be the only option - pt has not been able to tolerate hydrotherapy previously due to severe pain - Palliative Care consult requested to establish GOC with family - they along with the patient wish to  continue on  End stage renal disease - cont dialysis per Nephrology for now   Anemia of chronic disease - stable  Protein-calorie malnutrition, severe - nutritional supplements  DM CBG reasonably controlled at present time  Hx of HTN  Hx of CVA with suspected vascular dementia  Decubitus ulcer of R heel  Severe protein calorie malnutrition  Spoke with daughter at length about poor prognosis and doubt that sacral decubitus will heal.  Ask SLP to see and evaluate for special diet   Code Status: DNR Family Communication: daughter at bedside Disposition Plan:  Consultants: Nephro Palliative Care  Procedures: none  Antibiotics: Vanc 6/20 >> Ancef 6/25  DVT prophylaxis: SCDs + SQ heparin   HPI/Subjective: C/o being tired   Objective: Blood pressure 108/59, pulse 99, temperature 97.4 F (36.3 C), temperature source Oral, resp. rate 18, height 6\' 6"  (1.981 m), weight 86.1 kg (189 lb 13.1 oz), SpO2 98.00%.  Intake/Output Summary (Last 24 hours) at 02/03/13 1115 Last data filed at 02/03/13 1100  Gross per 24 hour  Intake    420 ml  Output   1552 ml  Net  -1132 ml   Exam: General: No acute respiratory distress Lungs: Clear to auscultation bilaterally without wheezes or crackles Cardiovascular: Tachycardic but regular without appreciable murmur or gallop Abdomen: Nontender, nondistended, soft, bowel sounds positive, no rebound, no ascites, no appreciable mass Extremities: No significant cyanosis, clubbing, or edema bilateral lower extremities Skin: large stage 4 decubitus ulcer with significant amount of necrotic tissue  Data Reviewed: Basic Metabolic Panel:  Recent Labs Lab 01/29/13 0015 01/29/13 0118 01/29/13 0130  01/29/13 0848 01/30/13 0830 02/02/13 0704 02/03/13 0525  NA 142 145  --  137 132* 133* 136  K 2.2* 2.8*  --  4.2 4.5 4.3 3.9  CL 113* 112  --  99 95* 94* 99  CO2 20  --   --  27 25 25 30   GLUCOSE 80 72  --  134* 118* 88 96  BUN 25* 23   --  38* 52* 44* 29*  CREATININE 2.24* 2.70*  --  3.67* 5.04* 5.56* 3.75*  CALCIUM 5.9*  --   --  9.1 8.9 9.1 9.3  MG  --   --  1.4*  --   --   --   --   PHOS  --   --   --   --   --  5.0*  --    Liver Function Tests:  Recent Labs Lab 01/29/13 0015 01/29/13 0848 02/02/13 0704  AST 31 45*  --   ALT 25 35  --   ALKPHOS 43 71  --   BILITOT 0.2* 0.4  --   PROT 5.1* 7.4  --   ALBUMIN 1.4* 2.0* 1.8*    Recent Labs Lab 01/29/13 0015  LIPASE 41   CBC:  Recent Labs Lab 01/29/13 0015 01/29/13 0118 01/29/13 0848 02/01/13 0510 02/02/13 0704 02/03/13 0525  WBC 18.7*  --  25.7* 18.8* 18.0* 16.0*  NEUTROABS 16.8*  --  21.2*  --   --   --   HGB 6.4* 7.1* 10.1* 8.9* 8.6* 8.6*  HCT 19.2* 21.0* 31.4* 27.2* 25.9* 27.4*  MCV 83.1  --  84.4 82.2 81.7 83.8  PLT 382  --  460* 451* 489* 493*   CBG:  Recent Labs Lab 01/29/13 0217 02/02/13 1228  GLUCAP 124* 88    Recent Results (from the past 240 hour(s))  CULTURE, BLOOD (ROUTINE X 2)     Status: None   Collection Time    01/29/13 12:30 AM      Result Value Range Status   Specimen Description BLOOD RIGHT ARM   Final   Special Requests BOTTLES DRAWN AEROBIC AND ANAEROBIC 10CC EACH   Final   Culture  Setup Time 01/29/2013 15:10   Final   Culture     Final   Value: METHICILLIN RESISTANT STAPHYLOCOCCUS AUREUS     Note: RIFAMPIN AND GENTAMICIN SHOULD NOT BE USED AS SINGLE DRUGS FOR TREATMENT OF STAPH INFECTIONS. CRITICAL RESULT CALLED TO, READ BACK BY AND VERIFIED WITH: ANNA AGUILAR 01/31/13 1440 BY SMITHERSJ     Note: Gram Stain Report Called to,Read Back By and Verified With: BERNADETTE LOMCALLO 01/30/13 @ 1:14PM BY RUSCA.   Report Status 02/01/2013 FINAL   Final   Organism ID, Bacteria METHICILLIN RESISTANT STAPHYLOCOCCUS AUREUS   Final  CULTURE, BLOOD (ROUTINE X 2)     Status: None   Collection Time    01/29/13 12:45 AM      Result Value Range Status   Specimen Description BLOOD LEFT HAND   Final   Special Requests BOTTLES  DRAWN AEROBIC ONLY 10CC   Final   Culture  Setup Time 01/29/2013 15:11   Final   Culture     Final   Value: STAPHYLOCOCCUS AUREUS     Note: SUSCEPTIBILITIES PERFORMED ON PREVIOUS CULTURE WITHIN THE LAST 5 DAYS.     Note: Gram Stain Report Called to,Read Back By and Verified With: BERNADETTE [facility] 01/30/13 @ 1:14PM BY RUSCA.   Report Status 02/01/2013 FINAL   Final  URINE CULTURE     Status: None   Collection Time    01/29/13  1:56 AM      Result Value Range Status   Specimen Description URINE, RANDOM   Final   Special Requests NONE   Final   Culture  Setup Time 01/29/2013 15:24   Final   Colony Count NO GROWTH   Final   Culture NO GROWTH   Final   Report Status 01/30/2013 FINAL   Final  MRSA PCR SCREENING     Status: None   Collection Time    01/29/13  5:30 AM      Result Value Range Status   MRSA by PCR NEGATIVE  NEGATIVE Final   Comment:            The GeneXpert MRSA Assay (FDA     approved for NASAL specimens     only), is one component of a     comprehensive MRSA colonization     surveillance program. It is not     intended to diagnose MRSA     infection nor to guide or     monitor treatment for     MRSA infections.     Studies:  Recent x-ray studies have been reviewed in detail by the Attending Physician  Scheduled Meds:  Scheduled Meds: . [START ON 02/04/2013]  ceFAZolin (ANCEF) IV  2 g Intravenous Q M,W,F-HD  . cinacalcet  30 mg Oral BID WC  . collagenase   Topical Daily  . darbepoetin (ARANESP) injection - DIALYSIS  60 mcg Intravenous Q Mon-HD  . feeding supplement (NEPRO CARB STEADY)  237 mL Oral BID BM  . feeding supplement  30 mL Oral TID WC  . gabapentin  100 mg Oral Daily  . lanthanum  500 mg Oral TID WC  . metroNIDAZOLE  500 mg Oral Q8H  . midodrine  10 mg Oral TID WC  . multivitamin  1 tablet Oral Daily  . omega-3 acid ethyl esters  1 g Oral Daily  . sodium chloride  3 mL Intravenous Q12H  . vancomycin  750 mg Intravenous Q M,W,F-HD    Time  spent on care of this patient: 25 min   Mckynlie Vanderslice, DO   [facility] [phone]   If 7PM-7AM, please contact night-coverage [email or web address] Password [email or web address] 02/03/2013, 11:15 AM   LOS: 5 days

## 2013-02-03 NOTE — Progress Notes (Signed)
  Echocardiogram 2D Echocardiogram has been performed.  [name] 02/03/2013, 3:27 PM

## 2013-02-04 ENCOUNTER — Encounter (HOSPITAL_COMMUNITY): Payer: Self-pay | Admitting: Internal Medicine

## 2013-02-04 DIAGNOSIS — L89109 Pressure ulcer of unspecified part of back, unspecified stage: Secondary | ICD-10-CM

## 2013-02-04 DIAGNOSIS — E43 Unspecified severe protein-calorie malnutrition: Secondary | ICD-10-CM

## 2013-02-04 DIAGNOSIS — M86179 Other acute osteomyelitis, unspecified ankle and foot: Secondary | ICD-10-CM

## 2013-02-04 LAB — RENAL FUNCTION PANEL
Albumin: 2 g/dL — ABNORMAL LOW (ref 3.5–5.2)
BUN: 44 mg/dL — ABNORMAL HIGH (ref 6–23)
CO2: 25 mEq/L (ref 19–32)
Calcium: 9.2 mg/dL (ref 8.4–10.5)
Chloride: 95 mEq/L — ABNORMAL LOW (ref 96–112)
Creatinine, Ser: 5.3 mg/dL — ABNORMAL HIGH (ref 0.50–1.35)
GFR calc Af Amer: 11 mL/min — ABNORMAL LOW (ref >90)
GFR calc non Af Amer: 9 mL/min — ABNORMAL LOW (ref >90)
Glucose, Bld: 111 mg/dL — ABNORMAL HIGH (ref 70–99)
Phosphorus: 4.7 mg/dL — ABNORMAL HIGH (ref 2.3–4.6)
Potassium: 3.7 mEq/L (ref 3.5–5.1)
Sodium: 132 mEq/L — ABNORMAL LOW (ref 135–145)

## 2013-02-04 LAB — CBC
HCT: 27.9 % — ABNORMAL LOW (ref 39.0–52.0)
Hemoglobin: 9.2 g/dL — ABNORMAL LOW (ref 13.0–17.0)
MCH: 27.2 pg (ref 26.0–34.0)
MCHC: 33 g/dL (ref 30.0–36.0)
MCV: 82.5 fL (ref 78.0–100.0)
Platelets: 509 10*3/uL — ABNORMAL HIGH (ref 150–400)
RBC: 3.38 MIL/uL — ABNORMAL LOW (ref 4.22–5.81)
RDW: 16.9 % — ABNORMAL HIGH (ref 11.5–15.5)
WBC: 15.1 10*3/uL — ABNORMAL HIGH (ref 4.0–10.5)

## 2013-02-04 MED ORDER — LIDOCAINE HCL (PF) 1 % IJ SOLN: 5.0000 mL | INTRAMUSCULAR | Status: DC | PRN

## 2013-02-04 MED ORDER — HEPARIN SODIUM (PORCINE) 1000 UNIT/ML DIALYSIS: 20.0000 [IU]/kg | INTRAMUSCULAR | Status: DC | PRN

## 2013-02-04 MED ORDER — VANCOMYCIN HCL IN DEXTROSE 1-5 GM/200ML-% IV SOLN
1000.0000 mg | INTRAVENOUS | Status: DC
  Administered 2013-02-07 – 2013-02-16 (×5): 1000 mg via INTRAVENOUS
  Filled 2013-02-04 (×10): qty 200

## 2013-02-04 MED ORDER — MIDODRINE HCL 5 MG PO TABS
ORAL_TABLET | ORAL | Status: AC
  Administered 2013-02-04: 10 mg via ORAL
  Filled 2013-02-04: qty 2

## 2013-02-04 MED ORDER — HEPARIN SODIUM (PORCINE) 1000 UNIT/ML DIALYSIS: 1000.0000 [IU] | INTRAMUSCULAR | Status: DC | PRN

## 2013-02-04 MED ORDER — PENTAFLUOROPROP-TETRAFLUOROETH EX AERO: 1.0000 "application " | INHALATION_SPRAY | CUTANEOUS | Status: DC | PRN

## 2013-02-04 MED ORDER — SODIUM CHLORIDE 0.9 % IV SOLN: 100.0000 mL | INTRAVENOUS | Status: DC | PRN

## 2013-02-04 MED ORDER — NEPRO/CARBSTEADY PO LIQD: 237.0000 mL | ORAL | Status: DC | PRN

## 2013-02-04 MED ORDER — ALTEPLASE 2 MG IJ SOLR
2.0000 mg | Freq: Once | INTRAMUSCULAR | Status: DC | PRN
  Filled 2013-02-04: qty 2

## 2013-02-04 MED ORDER — LIDOCAINE-PRILOCAINE 2.5-2.5 % EX CREA: 1.0000 "application " | TOPICAL_CREAM | CUTANEOUS | Status: DC | PRN

## 2013-02-04 NOTE — Consult Note (Signed)
Reason for Consult: Large sacral decubitus Referring Physician: Dr. Carolin Coy [name] is an 79 y.o. male.  HPI: 79 y.o. male who has ESRD on hemodialysis who Recently had sepsis due to c.difficile colitis on (5/19 - 01/10/2013) was brought back from nursing home 6/21 after patient was found to be hypotensive with sBP 60. On arrival patient also was found to be febrile, tachycardic, and anemic. Patient was started on empiric antibiotics of vancomycin and cefepime after blood cultures were obtained and given 1 L normal saline in addn to PRBC transfusion for anemia. Patient's blood cultures reveal him to have MRSA bacteremia. He also sufferes from stage IV sacral decub with palpable bone that is thought to be the source of his bacteremia. He is DNR, but further goals of care discussions are anticipated with palliative care. ID consultation is seeing him based upon his MRSA bacteremia. Wound care consultation has seen him for his sacral decubitus ulcer that measures 10cm X 10cm X 1.25cm, with 1cm undermining from 3 o'clock to 6 o'clock.  Wound care recommended surgical consultation as the wound was felt to be to significant for conservative management alone.  The patient has not been able to tolerate hydrotherapy due to severe pain.  The family is wanting to be aggressive with all of his treatments.   Past Medical History  Diagnosis Date  . Hypertension   . Diabetes mellitus     typeII / No meds  . Cataract 01/2002    Right  . Dialysis patient 10/13/2007  . Radiculopathy 05/14/2006    NCV study negative carpal tunnel, Left C5 radiculopathy  . Arthritis     Knee pain  . ESRD (end stage renal disease) on dialysis 2009    [facility] diaylsis Monday , Wed. and Friday per Dr. Kathrene Bongo  . Stroke   . Elevated PSA     h/o, pt had declined further eval.   . Syncope 11/02/2012    Past Surgical History  Procedure Laterality Date  . Varicose vein surgery  1978  . External fixation wrist  fracture  1990  . Thyroidectomy, partial  08/03/2003    Right thyroid lobectomy, subtotal parathyr. sec hyperparthyroidismadenosis  . Av fistula placement  07/2004    Left arm AV fistula placement [name] via [place])  . Eye surgery      Cataract    Family History  Problem Relation Age of Onset  . Cancer Father 102    Lung Cancer//? stomach cancer  . Cancer Sister 65    brain tumor    Social History:  reports that he quit smoking about 10 years ago. His smoking use included Cigarettes. He smoked 0.00 packs per day for 10 years. He has never used smokeless tobacco. He reports that he does not drink alcohol or use illicit drugs.  Allergies: No Known Allergies  Medications: I have reviewed the patient's current medications.  Results for orders placed during the hospital encounter of 01/29/13 (from the past 48 hour(s))  CBC     Status: Abnormal   Collection Time    02/02/13  7:04 AM      Result Value Range   WBC 18.0 (*) 4.0 - 10.5 K/uL   RBC 3.17 (*) 4.22 - 5.81 MIL/uL   Hemoglobin 8.6 (*) 13.0 - 17.0 g/dL   HCT 16.1 (*) 09.6 - 04.5 %   MCV 81.7  78.0 - 100.0 fL   MCH 27.1  26.0 - 34.0 pg   MCHC 33.2  30.0 -  36.0 g/dL   RDW 09.6 (*) 04.5 - 40.9 %   Platelets 489 (*) 150 - 400 K/uL  RENAL FUNCTION PANEL     Status: Abnormal   Collection Time    02/02/13  7:04 AM      Result Value Range   Sodium 133 (*) 135 - 145 mEq/L   Potassium 4.3  3.5 - 5.1 mEq/L   Chloride 94 (*) 96 - 112 mEq/L   CO2 25  19 - 32 mEq/L   Glucose, Bld 88  70 - 99 mg/dL   BUN 44 (*) 6 - 23 mg/dL   Creatinine, Ser 8.11 (*) 0.50 - 1.35 mg/dL   Calcium 9.1  8.4 - 91.4 mg/dL   Phosphorus 5.0 (*) 2.3 - 4.6 mg/dL   Albumin 1.8 (*) 3.5 - 5.2 g/dL   GFR calc non Af Amer 9 (*) >90 mL/min   GFR calc Af Amer 10 (*) >90 mL/min   Comment:            The eGFR has been calculated     using the CKD EPI equation.     This calculation has not been     validated in all clinical     situations.     eGFR's  persistently     <90 mL/min signify     possible Chronic Kidney Disease.  GLUCOSE, CAPILLARY     Status: None   Collection Time    02/02/13 12:28 PM      Result Value Range   Glucose-Capillary 88  70 - 99 mg/dL   Comment 1 Notify RN     Comment 2 Documented in Chart    VANCOMYCIN, RANDOM     Status: None   Collection Time    02/02/13  3:30 PM      Result Value Range   Vancomycin Rm 18.0     Comment:            Random Vancomycin therapeutic     range is dependent on dosage and     time of specimen collection.     A peak range is 20.0-40.0 ug/mL     A trough range is 5.0-15.0 ug/mL             CBC     Status: Abnormal   Collection Time    02/03/13  5:25 AM      Result Value Range   WBC 16.0 (*) 4.0 - 10.5 K/uL   RBC 3.27 (*) 4.22 - 5.81 MIL/uL   Hemoglobin 8.6 (*) 13.0 - 17.0 g/dL   HCT 78.2 (*) 95.6 - 21.3 %   MCV 83.8  78.0 - 100.0 fL   MCH 26.3  26.0 - 34.0 pg   MCHC 31.4  30.0 - 36.0 g/dL   RDW 08.6 (*) 57.8 - 46.9 %   Platelets 493 (*) 150 - 400 K/uL  BASIC METABOLIC PANEL     Status: Abnormal   Collection Time    02/03/13  5:25 AM      Result Value Range   Sodium 136  135 - 145 mEq/L   Potassium 3.9  3.5 - 5.1 mEq/L   Chloride 99  96 - 112 mEq/L   CO2 30  19 - 32 mEq/L   Glucose, Bld 96  70 - 99 mg/dL   BUN 29 (*) 6 - 23 mg/dL   Comment: DELTA CHECK NOTED   Creatinine, Ser 3.75 (*) 0.50 - 1.35 mg/dL   Calcium 9.3  8.4 - 10.5 mg/dL   GFR calc non Af Amer 14 (*) >90 mL/min   GFR calc Af Amer 16 (*) >90 mL/min   Comment:            The eGFR has been calculated     using the CKD EPI equation.     This calculation has not been     validated in all clinical     situations.     eGFR's persistently     <90 mL/min signify     possible Chronic Kidney Disease.    No results found.  Review of Systems  Unable to perform ROS: age  Skin:       C/o in the sacral decub   Blood pressure 121/70, pulse 69, temperature 97.9 F (36.6 C), temperature source Oral,  resp. rate 18, height 6\' 6"  (1.981 m), weight 190 lb 4.1 oz (86.3 kg), SpO2 98.00%. Physical Exam  Constitutional: No distress.  Chronic ill appearing, some contractures in upper and lower extremities  HENT:  Head: Normocephalic and atraumatic.  Eyes: Conjunctivae are normal. Pupils are equal, round, and reactive to light.  Neck: Normal range of motion. Neck supple.  Cardiovascular: Normal rate and regular rhythm.   Respiratory: Effort normal and breath sounds normal.  GI: Soft. Bowel sounds are normal. He exhibits no distension. There is no tenderness.  Genitourinary: Rectum normal.  Musculoskeletal:  contractures  Skin:     Psychiatric: He has a normal mood and affect.    Assessment/Plan: Large Sacral Decubitus: I feel the wound would benefit from bedside debridement although given his medical conditions, nutritional status and overall health its not recommended for more aggressive then that at this time.  I spoke in detail with the patient and with his daughter.  She has given consent for bedside debridement of his sacral decub to help facilitate healing better.  They understand that ultimate healing of this wound is unlikely and that wound care is essential.  Wound still recommend trying hydrotherapy again as this will clean up the base of the wound better then the sharp debridement I did today.  Spoke with Dr. Benjamine Mola and she will order this.  Procedural debridement note to follow.  [name] 02/04/2013, 6:59 AM

## 2013-02-04 NOTE — Progress Notes (Signed)
Physical Therapy Wound Treatment Patient Details  Name: [name] MRN: [id number] Date of Birth: [birth year]  Today's Date: 02/04/2013 Time: 6213-0865 Time Calculation (min): 47 min  Subjective  Subjective: Pt asking about getting in his truck. Patient and Family Stated Goals: not stated Prior Treatments: pulsatile lavage, debridement  Pain Score: Pain Score: Asleep  Wound Assessment  Pressure Ulcer 12/27/12 Unstageable - Full thickness tissue loss in which the base of the ulcer is covered by slough (yellow, tan, gray, green or brown) and/or eschar (tan, brown or black) in the wound bed. (Active)  State of Healing Non-healing 02/04/2013  2:24 PM  Site / Wound Assessment Yellow;Pink;Red 02/04/2013  2:24 PM  % Wound base Red or Granulating 35% 02/04/2013  2:24 PM  % Wound base Yellow 65% 02/04/2013  2:24 PM  % Wound base Black 0% 02/04/2013  2:24 PM  % Wound base Other (Comment) 0% 02/02/2013 12:30 PM  Peri-wound Assessment Purple 02/04/2013  2:24 PM  Wound Length (cm) 10.5 cm 02/04/2013  2:24 PM  Wound Width (cm) 10 cm 02/04/2013  2:24 PM  Wound Depth (cm) 4 cm 02/04/2013  2:24 PM  Tunneling (cm) 0 02/02/2013 12:30 PM  Margins Epibole (rolled edges) 02/02/2013 12:30 PM  Drainage Amount Minimal 02/04/2013  2:24 PM  Drainage Description Serosanguineous 02/04/2013  2:24 PM  Treatment Packing (Saline gauze) 02/04/2013  2:24 PM  Dressing Type Gauze (Comment);Barrier Film (skin prep);Foam 02/04/2013  2:24 PM  Dressing Changed 02/04/2013  2:24 PM     Incision 04/13/12 Arm Left (Active)   Selective Debridement - Ultrasonic wound therapy @35  KHz (+/- 3 KHz) x 7 minutes Selective Debridement - Location: sacrum Selective Debridement - Tools Used: Forceps;Scissors Selective Debridement - Tissue Removed: yellow slough   Wound Assessment and Plan  Wound Therapy - Assess/Plan/Recommendations Wound Therapy - Clinical Statement: Pt with large sacral wound that can benefit from hydrotherapy to decr necrotic  tissue and promote healing. Wound Therapy - Functional Problem List: immobility Factors Delaying/Impairing Wound Healing: Diabetes Mellitus;Altered sensation;Immobility Hydrotherapy Plan: Debridement;Dressing change;Patient/family education;Ultrasonic wound therapy @35  KHz (+/- 3 KHz) Wound Therapy - Frequency: 6X / week Wound Therapy - Follow Up Recommendations: Skilled nursing facility Wound Plan: decr. bacterial load and selectively debide to healthy tissues  Wound Therapy Goals- Improve the function of patient's integumentary system by progressing the wound(s) through the phases of wound healing (inflammation - proliferation - remodeling) by: Decrease Necrotic Tissue to: 30 Decrease Necrotic Tissue - Progress: Goal set today Increase Granulation Tissue to: 70 Increase Granulation Tissue - Progress: Goal set today  Goals will be updated until maximal potential achieved or discharge criteria met.  Discharge criteria: when goals achieved, discharge from hospital, MD decision/surgical intervention, no progress towards goals, refusal/missing three consecutive treatments without notification or medical reason.  GP     Dewaine Morocho 02/04/2013, 3:51 PM  [facility] PT [phone]

## 2013-02-04 NOTE — Progress Notes (Signed)
Subjective: Pt states he tolerated the wound care much better today.  Just had hydrotherapy this morning.  No complaints.  Objective: Vital signs in last 24 hours: Temp:  [97.5 F (36.4 C)-98.6 F (37 C)] 97.6 F (36.4 C) (06/27 1132) Pulse Rate:  [68-89] 78 (06/27 1132) Resp:  [15-19] 15 (06/27 1132) BP: (109-127)/(58-71) 124/68 mmHg (06/27 1132) SpO2:  [95 %-99 %] 99 % (06/27 1132) Weight:  [190 lb 4.1 oz (86.3 kg)] 190 lb 4.1 oz (86.3 kg) (06/26 2204) Last BM Date: 02/04/13  Intake/Output from previous day: 06/26 0701 - 06/27 0700 In: 600 [P.O.:600] Out: 654 [Urine:650; Stool:4] Intake/Output this shift: Total I/O In: 390 [P.O.:240; IV Piggyback:150] Out: 1599 [Other:1599]  PE: Gen:  Alert, NAD, pleasant Buttock:  See hydrotherapy's note   Lab Results:   Recent Labs  02/03/13 0525 02/04/13 0752  WBC 16.0* 15.1*  HGB 8.6* 9.2*  HCT 27.4* 27.9*  PLT 493* 509*   BMET  Recent Labs  02/03/13 0525 02/04/13 0752  NA 136 132*  K 3.9 3.7  CL 99 95*  CO2 30 25  GLUCOSE 96 111*  BUN 29* 44*  CREATININE 3.75* 5.30*  CALCIUM 9.3 9.2   PT/INR No results found for this basename: LABPROT, INR,  in the last 72 hours CMP     Component Value Date/Time   NA 132* 02/04/2013 0752   K 3.7 02/04/2013 0752   CL 95* 02/04/2013 0752   CO2 25 02/04/2013 0752   GLUCOSE 111* 02/04/2013 0752   BUN 44* 02/04/2013 0752   CREATININE 5.30* 02/04/2013 0752   CALCIUM 9.2 02/04/2013 0752   CALCIUM 9.9 09/24/2007 1113   PROT 7.4 01/29/2013 0848   ALBUMIN 2.0* 02/04/2013 0752   AST 45* 01/29/2013 0848   ALT 35 01/29/2013 0848   ALKPHOS 71 01/29/2013 0848   BILITOT 0.4 01/29/2013 0848   GFRNONAA 9* 02/04/2013 0752   GFRAA 11* 02/04/2013 0752   Lipase     Component Value Date/Time   LIPASE 41 01/29/2013 0015       Studies/Results: No results found.  Anti-infectives: Anti-infectives   Start     Dose/Rate Route Frequency Ordered Stop   02/04/13 1800  ceFAZolin (ANCEF) IVPB 2  g/50 mL premix     2 g 100 mL/hr over 30 Minutes Intravenous Every M-W-F (Hemodialysis) 02/02/13 1520 03/18/13 1159   02/03/13 1400  metroNIDAZOLE (FLAGYL) tablet 500 mg     500 mg Oral 3 times per day 02/03/13 1105     02/02/13 1530  metroNIDAZOLE (FLAGYL) tablet 500 mg  Status:  Discontinued     500 mg Oral 3 times per day 02/02/13 1520 02/02/13 1520   01/31/13 2200  vancomycin (VANCOCIN) 50 mg/mL oral solution 125 mg  Status:  Discontinued     125 mg Oral Daily 01/31/13 2116 02/03/13 1105   01/31/13 2000  ceFEPIme (MAXIPIME) 2 g in dextrose 5 % 50 mL IVPB  Status:  Discontinued     2 g 100 mL/hr over 30 Minutes Intravenous Every M-W-F (2000) 01/29/13 0546 02/02/13 1032   01/31/13 1200  vancomycin (VANCOCIN) IVPB 750 mg/150 ml premix     750 mg 150 mL/hr over 60 Minutes Intravenous Every M-W-F (Hemodialysis) 01/29/13 0546     01/29/13 0600  vancomycin (VANCOCIN) IVPB 750 mg/150 ml premix     750 mg 150 mL/hr over 60 Minutes Intravenous  Once 01/29/13 0545 01/29/13 0958   01/29/13 0600  ceFEPIme (MAXIPIME) 2 g in dextrose 5 %  50 mL IVPB     2 g 100 mL/hr over 30 Minutes Intravenous  Once 01/29/13 0546 01/29/13 0909   01/29/13 0045  vancomycin (VANCOCIN) IVPB 1000 mg/200 mL premix     1,000 mg 200 mL/hr over 60 Minutes Intravenous  Once 01/29/13 0033 01/29/13 0226   01/29/13 0045  piperacillin-tazobactam (ZOSYN) IVPB 2.25 g     2.25 g 100 mL/hr over 30 Minutes Intravenous  Once 01/29/13 0033 01/29/13 0333   01/29/13 0045  gentamicin (GARAMYCIN) IVPB 100 mg     100 mg 200 mL/hr over 30 Minutes Intravenous  Once 01/29/13 0043 01/29/13 0442       Assessment/Plan Large sacral decub s/p bedside debridement 1.  Cont wound care and hydrotherapy 2.  Ambulate and IS 3.  SCD's 4.  Will check wound this weekend  MRSA sepsis/leukocytosis - down to 15.1 Protein calorie malnutrition, severe    LOS: 6 days    [name], Paidyn Mcferran 02/04/2013, 3:11 PM Pager: [phone]

## 2013-02-04 NOTE — Progress Notes (Signed)
ANTIBIOTIC CONSULT NOTE - FOLLOW UP  Pharmacy Consult for Vancomycin Indication: MRSA bacteremia, worsening sacral decub ulcers/presumed osteomyelitis  No Known Allergies  Height: 66 inches Weight: 86.3 kg on 6/26  Vital Signs: Temp: 97.6 F (36.4 C) (06/27 1132) Temp src: Oral (06/27 1132) BP: 124/68 mmHg (06/27 1132) Pulse Rate: 78 (06/27 1132)  Labs:  Recent Labs  02/02/13 0704 02/03/13 0525 02/04/13 0752  WBC 18.0* 16.0* 15.1*  HGB 8.6* 8.6* 9.2*  PLT 489* 493* 509*  CREATININE 5.56* 3.75* 5.30*   Estimated Creatinine Clearance: 13.6 ml/min (by C-G formula based on Cr of 5.3).  Recent Labs  02/02/13 1530 02/04/13 0754  VANCORANDOM 18.0 13.2    Assessment:   Day # 6 Vancomcyin   Day #1 Cefazolin   Day #2 Flagyl PO     Pre-dialysis Vancomycin level today is 13.2 mcg/ml.  Vancomcyin 750 mg IV given with dialysis this morning. To continue Vanc, Ancef and PO Flagyl x 6 weeks (thru August 2nd)  Goal of Therapy:  pre-dialysis Vancomycin levels 15-25 mcg/ml  Plan:   Will increase Vancomycin to 1gram IV after each HD; next dose due Monday, 02/07/13.  Dennie Fetters, [place] Pager: [phone] 02/04/2013,4:17 PM

## 2013-02-04 NOTE — Procedures (Signed)
Pt seen on HD.  Ap 180 Vp 190.  Tolerating HD well.  Still no note from palliative care regarding outcome of conference with family.

## 2013-02-04 NOTE — Progress Notes (Signed)
Patient [name]      DOB: 1931-04-20      WUJ:[id number]   Palliative Medicine Team at [facility] Progress Note    Subjective: Visited patient in HD, sitting up in recliner alert, patient pleasantly confused, appears "happy". Called Dr Briant Cedar to inform him of decisions made by family on 6/25, per note in EPIC.   Filed Vitals:   02/04/13 1132  BP: 124/68  Pulse: 78  Temp: 97.6 F (36.4 C)  Resp: 15   Physical exam: General:Alert, verbally responsive HEENT: buccal mucosa moist, no oral lesions  CHEST: CTA bilaterally  CVS: RRR  ABD: soft, non-tender, BS audible  EXT: R arm + edema, trace pedal edema bilaterally   Assessment and plan:  Patient with ESRD, on dialysis x 5 years, currently hospitalized for hypotension and MRSA bacteremia. Re-meet with patient/family regarding current Goals of Care today.Family in agreement to have Palliative Care services to follow at SNF-  1) Code Status: DNR/DNI  2) Pain: Oxycodone 5-10 mg oral every 3 hours as needed  3) Disposition recommend Palliative care services to follow upon disposition to SNF   Time In Time Out Total Time Spent with Patient Total Overall Time  11:15a 11:30a 15 min 15 min   Freddie Breech, CNS-C Palliative Medicine Team [facility] Team Phone: [phone] Pager: [phone]

## 2013-02-04 NOTE — Progress Notes (Signed)
Pt with large episode of bowel incontinence, soiled bed, floor, and 1 prevalon boot. Reordered clean boot.

## 2013-02-04 NOTE — Progress Notes (Signed)
[facility] Progress Note [place] TEAM 1 - Stepdown/ICU TEAM   [name] [id number]:[id number] DOB: 01/03/1931 DOA: 01/29/2013 PCP: Crawford Givens, MD  Brief narrative: 79 y.o. male history of ESRD on hemodialysis who was recently admitted (5/19 - 01/10/2013) for sepsis secondary to C. difficile colitis brought back to the ER from nursing home 6/21 after patient was found to be hypotensive. On arrival patient also was found to be febrile, tachycardic, and hemoglobin low from his baseline. Patient was started on empiric antibiotics after blood cultures were obtained. Patient's sacral decubitus had a discharge on exam at presentation. Patient was found to be hypotensive and was given 1 L normal saline bolus and was started on empiric antibiotics along with PRBC transfusion. Patient's family had a detailed discussion with ER physician Dr. Arnoldo Morale and they requested to continue with DO NOT RESUSCITATE status and no vasopressors. Family discussion with palliative care is they would like to continue with HD and IV abx and treatment of sacral wound   Assessment/Plan:  MRSA Sepsis / leukocytosis - suspected source of entry is sacral decubitus - still hypotensive but fevers resolved  - blood cx both + for MRSA (2/2) - repeat cultures 6/25 recs per ID:   TTE to evaluate for vegetations shows none- no need for TEE as patient is going to be treated - presumed osteomyelitis:  he will need to vancomycin x 6 wks in addn to gram negative (ancef) and flagyl  - c.difficile infection- resolved- giving flagyl  Decubitus ulcer of sacral region, stage 4 - wound care consult performed - wounds are felt to be beyond conservative tx at this time - Palliative Care consult requested to establish GOC with family - they along with the patient wish to continue on; general surgery consulted and bedside debridement done with good results- recommending hydrotherapy-- will need to be medicated before procedure done  End  stage renal disease - cont dialysis per Nephrology for now   Anemia of chronic disease - stable  Protein-calorie malnutrition, severe - nutritional supplements  DM CBG reasonably controlled at present time  Hx of HTN  Hx of CVA with suspected vascular dementia  Decubitus ulcer of R heel  Severe protein calorie malnutrition  Spoke with daughter at length about poor prognosis and doubt that sacral decubitus will heal.  Ask SLP to see and evaluate for special diet   Code Status: DNR Family Communication: daughter at bedside Disposition Plan:  Consultants: Nephro Palliative Care SLP surgery  Procedures: none  Antibiotics: Vanc 6/20 >> Cefepime---- >Ancef 6/25 Flagyl 6/25  DVT prophylaxis: SCDs + SQ heparin   HPI/Subjective: in dialysis In good spirits- smiling  Objective: Blood pressure 126/67, pulse 76, temperature 97.9 F (36.6 C), temperature source Oral, resp. rate 16, height 6\' 6"  (1.981 m), weight 86.3 kg (190 lb 4.1 oz), SpO2 97.00%.  Intake/Output Summary (Last 24 hours) at 02/04/13 0836 Last data filed at 02/04/13 0600  Gross per 24 hour  Intake    600 ml  Output    654 ml  Net    -54 ml   Exam: General: No acute respiratory distress Lungs: Clear to auscultation bilaterally without wheezes or crackles Cardiovascular: Tachycardic but regular without appreciable murmur or gallop Abdomen: Nontender, nondistended, soft, bowel sounds positive, no rebound, no ascites, no appreciable mass Extremities: No significant cyanosis, clubbing, or edema bilateral lower extremities Skin: large stage 4 decubitus ulcer with significant amount of necrotic tissue  Data Reviewed: Basic Metabolic Panel:  Recent Labs Lab 01/29/13  0015 01/29/13 0118 01/29/13 0130 01/29/13 0848 01/30/13 0830 02/02/13 0704 02/03/13 0525  NA 142 145  --  137 132* 133* 136  K 2.2* 2.8*  --  4.2 4.5 4.3 3.9  CL 113* 112  --  99 95* 94* 99  CO2 20  --   --  27 25 25 30   GLUCOSE  80 72  --  134* 118* 88 96  BUN 25* 23  --  38* 52* 44* 29*  CREATININE 2.24* 2.70*  --  3.67* 5.04* 5.56* 3.75*  CALCIUM 5.9*  --   --  9.1 8.9 9.1 9.3  MG  --   --  1.4*  --   --   --   --   PHOS  --   --   --   --   --  5.0*  --    Liver Function Tests:  Recent Labs Lab 01/29/13 0015 01/29/13 0848 02/02/13 0704  AST 31 45*  --   ALT 25 35  --   ALKPHOS 43 71  --   BILITOT 0.2* 0.4  --   PROT 5.1* 7.4  --   ALBUMIN 1.4* 2.0* 1.8*    Recent Labs Lab 01/29/13 0015  LIPASE 41   CBC:  Recent Labs Lab 01/29/13 0015  01/29/13 0848 02/01/13 0510 02/02/13 0704 02/03/13 0525 02/04/13 0752  WBC 18.7*  --  25.7* 18.8* 18.0* 16.0* 15.1*  NEUTROABS 16.8*  --  21.2*  --   --   --   --   HGB 6.4*  < > 10.1* 8.9* 8.6* 8.6* 9.2*  HCT 19.2*  < > 31.4* 27.2* 25.9* 27.4* 27.9*  MCV 83.1  --  84.4 82.2 81.7 83.8 82.5  PLT 382  --  460* 451* 489* 493* 509*  < > = values in this interval not displayed. CBG:  Recent Labs Lab 01/29/13 0217 02/02/13 1228  GLUCAP 124* 88    Recent Results (from the past 240 hour(s))  CULTURE, BLOOD (ROUTINE X 2)     Status: None   Collection Time    01/29/13 12:30 AM      Result Value Range Status   Specimen Description BLOOD RIGHT ARM   Final   Special Requests BOTTLES DRAWN AEROBIC AND ANAEROBIC 10CC EACH   Final   Culture  Setup Time 01/29/2013 15:10   Final   Culture     Final   Value: METHICILLIN RESISTANT STAPHYLOCOCCUS AUREUS     Note: RIFAMPIN AND GENTAMICIN SHOULD NOT BE USED AS SINGLE DRUGS FOR TREATMENT OF STAPH INFECTIONS. CRITICAL RESULT CALLED TO, READ BACK BY AND VERIFIED WITH: ANNA AGUILAR 01/31/13 1440 BY SMITHERSJ     Note: Gram Stain Report Called to,Read Back By and Verified With: BERNADETTE LOMCALLO 01/30/13 @ 1:14PM BY RUSCA.   Report Status 02/01/2013 FINAL   Final   Organism ID, Bacteria METHICILLIN RESISTANT STAPHYLOCOCCUS AUREUS   Final  CULTURE, BLOOD (ROUTINE X 2)     Status: None   Collection Time    01/29/13  12:45 AM      Result Value Range Status   Specimen Description BLOOD LEFT HAND   Final   Special Requests BOTTLES DRAWN AEROBIC ONLY 10CC   Final   Culture  Setup Time 01/29/2013 15:11   Final   Culture     Final   Value: STAPHYLOCOCCUS AUREUS     Note: SUSCEPTIBILITIES PERFORMED ON PREVIOUS CULTURE WITHIN THE LAST 5 DAYS.     Note: Gram  Stain Report Called to,Read Back By and Verified With: BERNADETTE [facility] 01/30/13 @ 1:14PM BY RUSCA.   Report Status 02/01/2013 FINAL   Final  URINE CULTURE     Status: None   Collection Time    01/29/13  1:56 AM      Result Value Range Status   Specimen Description URINE, RANDOM   Final   Special Requests NONE   Final   Culture  Setup Time 01/29/2013 15:24   Final   Colony Count NO GROWTH   Final   Culture NO GROWTH   Final   Report Status 01/30/2013 FINAL   Final  MRSA PCR SCREENING     Status: None   Collection Time    01/29/13  5:30 AM      Result Value Range Status   MRSA by PCR NEGATIVE  NEGATIVE Final   Comment:            The GeneXpert MRSA Assay (FDA     approved for NASAL specimens     only), is one component of a     comprehensive MRSA colonization     surveillance program. It is not     intended to diagnose MRSA     infection nor to guide or     monitor treatment for     MRSA infections.  CULTURE, BLOOD (ROUTINE X 2)     Status: None   Collection Time    02/02/13  3:30 PM      Result Value Range Status   Specimen Description BLOOD RIGHT ARM   Final   Special Requests BOTTLES DRAWN AEROBIC AND ANAEROBIC 10CC   Final   Culture  Setup Time 02/03/2013 01:15   Final   Culture     Final   Value:        BLOOD CULTURE RECEIVED NO GROWTH TO DATE CULTURE WILL BE HELD FOR 5 DAYS BEFORE ISSUING A FINAL NEGATIVE REPORT   Report Status PENDING   Incomplete  CULTURE, BLOOD (ROUTINE X 2)     Status: None   Collection Time    02/02/13  3:40 PM      Result Value Range Status   Specimen Description BLOOD RIGHT HAND   Final   Special  Requests BOTTLES DRAWN AEROBIC ONLY 10CC   Final   Culture  Setup Time 02/03/2013 01:15   Final   Culture     Final   Value:        BLOOD CULTURE RECEIVED NO GROWTH TO DATE CULTURE WILL BE HELD FOR 5 DAYS BEFORE ISSUING A FINAL NEGATIVE REPORT   Report Status PENDING   Incomplete     Studies:  Recent x-ray studies have been reviewed in detail by the Attending Physician  Scheduled Meds:  Scheduled Meds: .  ceFAZolin (ANCEF) IV  2 g Intravenous Q M,W,F-HD  . cinacalcet  30 mg Oral BID WC  . collagenase   Topical Daily  . darbepoetin (ARANESP) injection - DIALYSIS  60 mcg Intravenous Q Mon-HD  . feeding supplement (NEPRO CARB STEADY)  237 mL Oral BID BM  . feeding supplement  30 mL Oral TID WC  . gabapentin  100 mg Oral Daily  . lanthanum  500 mg Oral TID WC  . metroNIDAZOLE  500 mg Oral Q8H  . midodrine  10 mg Oral TID WC  . multivitamin  1 tablet Oral Daily  . omega-3 acid ethyl esters  1 g Oral Daily  . sodium chloride  3  mL Intravenous Q12H  . vancomycin  750 mg Intravenous Q M,W,F-HD    Time spent on care of this patient: 25 min   Chayce Rullo, DO   [facility] [phone]   If 7PM-7AM, please contact night-coverage [email or web address] Password [facility] 02/04/2013, 8:36 AM   LOS: 6 days

## 2013-02-04 NOTE — Progress Notes (Signed)
BSE ordered secondary to patient reportedly having "difficulty swallowing/pocketing pills" and need to determine least restrictive diet. Patient is currently on Hemodialysis. SLP to attempt to see patient for BSE this PM.  Angela Nevin, MA, CCC-SLP [facility] Speech-Language Pathologist

## 2013-02-04 NOTE — Progress Notes (Signed)
Hemodialysis- Pt tolerated treatment well. System clotted, venous chamber. All blood rinsed back before could be lost. Ended 19 minutes early. Dr. Briant Cedar notified. Was able to finish vancomycin. Pt has no complaints at this time.

## 2013-02-04 NOTE — Progress Notes (Signed)
[facility]    Date of Admission:  01/29/2013   Total days of antibiotics 6        Day 6 vanco        Day 1 cefazolin        Day 2 po metronidazole   ID: [name] is a 79 y.o. male  with ESRD on HD, stage 4 sacral decub/probable osteomyelitis presents with sepsis found to have MRSA bacteremia and worsening decub ulcers/osteomyelitis.  Principal Problem:   Sepsis Active Problems:   End stage renal disease   Anemia of chronic disease   Decubitus ulcer of sacral region, stage 4   Protein-calorie malnutrition, severe   Pain in wound    Subjective: Underoing HD. No fevers  Medications:  .  ceFAZolin (ANCEF) IV  2 g Intravenous Q M,W,F-HD  . cinacalcet  30 mg Oral BID WC  . collagenase   Topical Daily  . darbepoetin (ARANESP) injection - DIALYSIS  60 mcg Intravenous Q Mon-HD  . feeding supplement (NEPRO CARB STEADY)  237 mL Oral BID BM  . feeding supplement  30 mL Oral TID WC  . gabapentin  100 mg Oral Daily  . lanthanum  500 mg Oral TID WC  . metroNIDAZOLE  500 mg Oral Q8H  . midodrine  10 mg Oral TID WC  . multivitamin  1 tablet Oral Daily  . omega-3 acid ethyl esters  1 g Oral Daily  . sodium chloride  3 mL Intravenous Q12H  . vancomycin  750 mg Intravenous Q M,W,F-HD    Objective: Vital signs in last 24 hours: Temp:  [97.5 F (36.4 C)-98.6 F (37 C)] 97.9 F (36.6 C) (06/27 0745) Pulse Rate:  [68-98] 79 (06/27 1000) Resp:  [16-19] 16 (06/27 1000) BP: (112-127)/(58-71) 118/69 mmHg (06/27 1000) SpO2:  [95 %-98 %] 97 % (06/27 0745) Weight:  [190 lb 4.1 oz (86.3 kg)] 190 lb 4.1 oz (86.3 kg) (06/26 2204)   General: No acute respiratory distress, frail appearing, in mild discomfort from wound change  Lungs: Clear to auscultation bilaterally without wheezes or crackles  Cardiovascular: Tachycardic but regular without appreciable murmur or gallop  Abdomen: Nontender, nondistended, soft, bowel sounds positive, no rebound, no ascites, no  appreciable mass  Extremities: No significant cyanosis, clubbing, or edema bilateral lower extremities  Skin: large stage 4 decubitus ulcer with significant amount of necrotic tissue, palpable bone at wound bed. Decub ulcers on heels are covered  Lab Results  Recent Labs  02/03/13 0525 02/04/13 0752  WBC 16.0* 15.1*  HGB 8.6* 9.2*  HCT 27.4* 27.9*  NA 136 132*  K 3.9 3.7  CL 99 95*  CO2 30 25  BUN 29* 44*  CREATININE 3.75* 5.30*   Liver Panel  Recent Labs  02/02/13 0704 02/04/13 0752  ALBUMIN 1.8* 2.0*    Microbiology: 6/21 blood cx: MRSA  6/21 urine cx : NGTD  5/20 cdiff POSITIVE 6/25 blood cx :NGTD  Studies/Results: No results found.   Assessment/Plan: 79 yo M with ESRD on HD, stage 4 sacral decub/probable osteomyelitis presents with sepsis found to have MRSA bacteremia and worsening decub ulcers/osteomyelitis.   - continue with vancomycin  For mrsa bacteremia, repeat cultures have documented clearance of bacteremia and TTE did not see vegetations. recommend 6 wk for complicated bacteremia leading to osteomyelitis - presumed osteomyelitis, no need for CT since the patient has palpable bone on exam: he will need to vancomycin given at HD x 6 wks in  addn to cefazolin 2gm IV with HD plus oral metronidazole 500mg  Q 8hr . - continue with wound care recommendations - c.difficile infection, resolved,=  Would be treated with metronidazole if it recurs   we will see back the patient in [facility] clinic in 4-6 wks to see how his treatment is going in order to convert to oral therapy  Will sign off, call for Verlin Dike [facility] Cell: [phone] Pager: [phone]  02/04/2013, 10:22 AM

## 2013-02-05 DIAGNOSIS — Z8673 Personal history of transient ischemic attack (TIA), and cerebral infarction without residual deficits: Secondary | ICD-10-CM

## 2013-02-05 DIAGNOSIS — A0472 Enterocolitis due to Clostridium difficile, not specified as recurrent: Secondary | ICD-10-CM

## 2013-02-05 NOTE — Progress Notes (Signed)
[facility] Progress Note   Kash Davie UEA:[id number] DOB: 02/21/1931 DOA: 01/29/2013 PCP: Crawford Givens, MD  Brief narrative: 79 y.o. male history of ESRD on hemodialysis who was recently admitted (5/19 - 01/10/2013) for sepsis secondary to C. difficile colitis brought back to the ER from nursing home 6/21 after patient was found to be hypotensive. On arrival patient also was found to be febrile, tachycardic, and hemoglobin low from his baseline. Patient was started on empiric antibiotics after blood cultures were obtained. Patient's sacral decubitus had a discharge on exam at presentation. Patient was found to be hypotensive and was given 1 L normal saline bolus and was started on empiric antibiotics along with PRBC transfusion. Patient's family had a detailed discussion with ER physician Dr. Arnoldo Morale and they requested to continue with DO NOT RESUSCITATE status and no vasopressors. Family discussion with palliative care is they would like to continue with HD and IV abx and treatment of sacral wound  Assessment/Plan:  MRSA Sepsis / leukocytosis - suspected source of entry is sacral decubitus - hypotension improved, fevers resolved  - blood cx both + for MRSA (2/2) - repeat cultures 6/25 recs per ID:   TTE to evaluate for vegetations shows none- no need for TEE as patient is going to be treated - presumed osteomyelitis:  he will need to vancomycin x 6 wks in addn to gram negative (ancef) and flagyl  - c.difficile infection- resolved- giving flagyl Decubitus ulcer of sacral region, stage 4 - wound care consult performed - wounds are felt to be beyond conservative tx at this time - Palliative Care consult requested to establish GOC with family - they along with the patient wish to continue on; general surgery consulted and bedside debridement done with good results- recommending hydrotherapy- - appreciate surgery input.  End stage renal disease - cont dialysis per Nephrology for now   Anemia of chronic disease - stable Protein-calorie malnutrition, severe - nutritional supplements DM CBG reasonably controlled at present time  Hx of HTN Hx of CVA with suspected vascular dementia Decubitus ulcer of R heel Severe protein calorie malnutrition  Code Status: DNR Family Communication: daughter at bedside Disposition Plan: SNF early next week, formal PT evaluation pending.   Consultants: Nephro Palliative Care SLP surgery  Procedures: none  Antibiotics: Vanc 6/20 >> Cefepime---- >Ancef 6/25 >> Flagyl 6/25 >>  DVT prophylaxis: SCDs + SQ heparin   HPI/Subjective: - feeling well this morning, denies complaints.   Objective: Blood pressure 133/61, pulse 78, temperature 98.4 F (36.9 C), temperature source Oral, resp. rate 18, height 6\' 6"  (1.981 m), weight 78 kg (171 lb 15.3 oz), SpO2 98.00%.  Intake/Output Summary (Last 24 hours) at 02/05/13 0905 Last data filed at 02/05/13 4782  Gross per 24 hour  Intake    390 ml  Output   1799 ml  Net  -1409 ml   Exam: General: No acute respiratory distress Lungs: Clear to auscultation bilaterally without wheezes or crackles Cardiovascular: Tachycardic but regular without appreciable murmur or gallop Abdomen: Nontender, nondistended, soft, bowel sounds positive, no rebound, no ascites, no appreciable mass Extremities: No significant cyanosis, clubbing, or edema bilateral lower extremities Skin: large stage 4 decubitus ulcer   Data Reviewed: Basic Metabolic Panel:  Recent Labs Lab 01/30/13 0830 02/02/13 0704 02/03/13 0525 02/04/13 0752  NA 132* 133* 136 132*  K 4.5 4.3 3.9 3.7  CL 95* 94* 99 95*  CO2 25 25 30 25   GLUCOSE 118* 88 96 111*  BUN 52* 44* 29*  44*  CREATININE 5.04* 5.56* 3.75* 5.30*  CALCIUM 8.9 9.1 9.3 9.2  PHOS  --  5.0*  --  4.7*   Liver Function Tests:  Recent Labs Lab 02/02/13 0704 02/04/13 0752  ALBUMIN 1.8* 2.0*   CBC:  Recent Labs Lab 02/01/13 0510 02/02/13 0704  02/03/13 0525 02/04/13 0752  WBC 18.8* 18.0* 16.0* 15.1*  HGB 8.9* 8.6* 8.6* 9.2*  HCT 27.2* 25.9* 27.4* 27.9*  MCV 82.2 81.7 83.8 82.5  PLT 451* 489* 493* 509*   CBG:  Recent Labs Lab 02/02/13 1228  GLUCAP 88    Recent Results (from the past 240 hour(s))  CULTURE, BLOOD (ROUTINE X 2)     Status: None   Collection Time    01/29/13 12:30 AM      Result Value Range Status   Specimen Description BLOOD RIGHT ARM   Final   Special Requests BOTTLES DRAWN AEROBIC AND ANAEROBIC 10CC EACH   Final   Culture  Setup Time 01/29/2013 15:10   Final   Culture     Final   Value: METHICILLIN RESISTANT STAPHYLOCOCCUS AUREUS     Note: RIFAMPIN AND GENTAMICIN SHOULD NOT BE USED AS SINGLE DRUGS FOR TREATMENT OF STAPH INFECTIONS. CRITICAL RESULT CALLED TO, READ BACK BY AND VERIFIED WITH: ANNA AGUILAR 01/31/13 1440 BY SMITHERSJ     Note: Gram Stain Report Called to,Read Back By and Verified With: BERNADETTE LOMCALLO 01/30/13 @ 1:14PM BY RUSCA.   Report Status 02/01/2013 FINAL   Final   Organism ID, Bacteria METHICILLIN RESISTANT STAPHYLOCOCCUS AUREUS   Final  CULTURE, BLOOD (ROUTINE X 2)     Status: None   Collection Time    01/29/13 12:45 AM      Result Value Range Status   Specimen Description BLOOD LEFT HAND   Final   Special Requests BOTTLES DRAWN AEROBIC ONLY 10CC   Final   Culture  Setup Time 01/29/2013 15:11   Final   Culture     Final   Value: STAPHYLOCOCCUS AUREUS     Note: SUSCEPTIBILITIES PERFORMED ON PREVIOUS CULTURE WITHIN THE LAST 5 DAYS.     Note: Gram Stain Report Called to,Read Back By and Verified With: BERNADETTE [facility] 01/30/13 @ 1:14PM BY RUSCA.   Report Status 02/01/2013 FINAL   Final  URINE CULTURE     Status: None   Collection Time    01/29/13  1:56 AM      Result Value Range Status   Specimen Description URINE, RANDOM   Final   Special Requests NONE   Final   Culture  Setup Time 01/29/2013 15:24   Final   Colony Count NO GROWTH   Final   Culture NO GROWTH   Final    Report Status 01/30/2013 FINAL   Final  MRSA PCR SCREENING     Status: None   Collection Time    01/29/13  5:30 AM      Result Value Range Status   MRSA by PCR NEGATIVE  NEGATIVE Final   Comment:            The GeneXpert MRSA Assay (FDA     approved for NASAL specimens     only), is one component of a     comprehensive MRSA colonization     surveillance program. It is not     intended to diagnose MRSA     infection nor to guide or     monitor treatment for     MRSA infections.  CULTURE, BLOOD (  ROUTINE X 2)     Status: None   Collection Time    02/02/13  3:30 PM      Result Value Range Status   Specimen Description BLOOD RIGHT ARM   Final   Special Requests BOTTLES DRAWN AEROBIC AND ANAEROBIC 10CC   Final   Culture  Setup Time 02/03/2013 01:15   Final   Culture     Final   Value:        BLOOD CULTURE RECEIVED NO GROWTH TO DATE CULTURE WILL BE HELD FOR 5 DAYS BEFORE ISSUING A FINAL NEGATIVE REPORT   Report Status PENDING   Incomplete  CULTURE, BLOOD (ROUTINE X 2)     Status: None   Collection Time    02/02/13  3:40 PM      Result Value Range Status   Specimen Description BLOOD RIGHT HAND   Final   Special Requests BOTTLES DRAWN AEROBIC ONLY 10CC   Final   Culture  Setup Time 02/03/2013 01:15   Final   Culture     Final   Value:        BLOOD CULTURE RECEIVED NO GROWTH TO DATE CULTURE WILL BE HELD FOR 5 DAYS BEFORE ISSUING A FINAL NEGATIVE REPORT   Report Status PENDING   Incomplete    Scheduled Meds: .  ceFAZolin (ANCEF) IV  2 g Intravenous Q M,W,F-HD  . cinacalcet  30 mg Oral BID WC  . collagenase   Topical Daily  . darbepoetin (ARANESP) injection - DIALYSIS  60 mcg Intravenous Q Mon-HD  . feeding supplement (NEPRO CARB STEADY)  237 mL Oral BID BM  . feeding supplement  30 mL Oral TID WC  . gabapentin  100 mg Oral Daily  . lanthanum  500 mg Oral TID WC  . metroNIDAZOLE  500 mg Oral Q8H  . midodrine  10 mg Oral TID WC  . multivitamin  1 tablet Oral Daily  . omega-3 acid  ethyl esters  1 g Oral Daily  . sodium chloride  3 mL Intravenous Q12H  . [START ON 02/07/2013] vancomycin  1,000 mg Intravenous Q M,W,F-HD    Time spent on care of this patient: 25 min   Pamella Pert, MD [facility] [phone] If 7PM-7AM, please contact night-coverage, [email or web address], Password [facility] 02/05/2013, 9:05 AM   LOS: 7 days

## 2013-02-05 NOTE — Progress Notes (Signed)
[facility] Progress Note  Subjective:  "[name]". "1967" but when RN says its "2012" he says "no its 2014". In good spirits with good appetite. Tolerating wound care per RN. No emerging complaints.  Objective Filed Vitals:   02/04/13 1132 02/04/13 1844 02/04/13 2039 02/05/13 0556  BP: 124/68 153/69 128/56 133/61  Pulse: 78 91 82 78  Temp: 97.6 F (36.4 C) 98.6 F (37 C) 98.9 F (37.2 C) 98.4 F (36.9 C)  TempSrc: Oral Oral Oral Oral  Resp: 15 18 18 18   Height:      Weight:   78 kg (171 lb 15.3 oz)   SpO2: 99% 99% 99% 98%   Physical Exam General: Alert, eating, talkative, NAD Heart: RRR Lungs: CTA bilaterally, No wheezes, rales, rhonchi Abdomen: soft, NT, normal BS Extremities: soft boots bilaterally. Pedal edema Dialysis Access: L AVF + bruit  Outpatient HD: MWF [facility] 4hrs F180 500/A1.5 EDW 77.5kg 2K/2Ca Bath LUA AVF EPO 6200 Heparin 5000    Assessment/Plan: 1. MRSA bacteremia/ treat as presumed osteo per ID - on Vanc x 6 weeks+ ancef; Leukocytosis improving; TTE with EF 55-60%, no regurg or concerns for vegetation 2. ESRD - MWF -family wishes to continue - see #9; therefore we will continue to dialyze him in a recliner to ensure he can tolerate. Added K+ bath next HD  3. Anemia - Hgb improving, now 9.2 s/p transfusion. Aranesp 60 q Monday  4. Secondary hyperparathyroidism - on sensipar and binders; P 4.7 5. Hypotension/volume -BP limiting volume removal - on midodrine , Net UF ~1.6 L yesterday- - post weight 78kg. Close to EDW but ? accuracy of bed scales. 6. Nutrition - Alb 2.0; renal diet; suppl nepro bid, prostat  7. Sacral decub - hydrotherapy and local care; nutrition, antibioitcs  8. Hx c diff + 5/20 - on po flagyl 9. EOL issues - Palliative on board. Per family conf - plan is to continue HD and medical care; prognosis for recovery very poor; prognosis for prolonged suffering is great; he would need to be able to dialyze in a recliner for 4 hrs plus  transport time in order to be discharged   Clydie Braun E. Thad Ranger [facility] Pager [phone] 02/05/2013,10:00 AM  LOS: 7 days   I have seen and examined this patient and agree with plan per [name].  No new CO.  If wound care could be done at [facility] then he could be DC'd, if not then needs to stay.  HD Mon.  [name] Kitchen [name],MD 02/05/2013 11:07 AM Additional Objective Labs: Basic Metabolic Panel:  Recent Labs Lab 02/02/13 0704 02/03/13 0525 02/04/13 0752  NA 133* 136 132*  K 4.3 3.9 3.7  CL 94* 99 95*  CO2 25 30 25   GLUCOSE 88 96 111*  BUN 44* 29* 44*  CREATININE 5.56* 3.75* 5.30*  CALCIUM 9.1 9.3 9.2  PHOS 5.0*  --  4.7*   Liver Function Tests:  Recent Labs Lab 02/02/13 0704 02/04/13 0752  ALBUMIN 1.8* 2.0*   No results found for this basename: LIPASE, AMYLASE,  in the last 168 hours CBC:  Recent Labs Lab 02/01/13 0510 02/02/13 0704 02/03/13 0525 02/04/13 0752  WBC 18.8* 18.0* 16.0* 15.1*  HGB 8.9* 8.6* 8.6* 9.2*  HCT 27.2* 25.9* 27.4* 27.9*  MCV 82.2 81.7 83.8 82.5  PLT 451* 489* 493* 509*   Blood Culture    Component Value Date/Time   SDES BLOOD RIGHT HAND 02/02/2013 1540   SPECREQUEST BOTTLES DRAWN AEROBIC  ONLY 10CC 02/02/2013 1540   CULT        BLOOD CULTURE RECEIVED NO GROWTH TO DATE CULTURE WILL BE HELD FOR 5 DAYS BEFORE ISSUING A FINAL NEGATIVE REPORT 02/02/2013 1540   REPTSTATUS PENDING 02/02/2013 1540    Cardiac Enzymes: No results found for this basename: CKTOTAL, CKMB, CKMBINDEX, TROPONINI,  in the last 168 hours CBG:  Recent Labs Lab 02/02/13 1228  GLUCAP 88   Iron Studies: No results found for this basename: IRON, TIBC, TRANSFERRIN, FERRITIN,  in the last 72 hours @lablastinr3 @ Studies/Results: No results found. Medications:   .  ceFAZolin (ANCEF) IV  2 g Intravenous Q M,W,F-HD  . cinacalcet  30 mg Oral BID WC  . collagenase   Topical Daily  . darbepoetin (ARANESP) injection - DIALYSIS  60 mcg Intravenous Q  Mon-HD  . feeding supplement (NEPRO CARB STEADY)  237 mL Oral BID BM  . feeding supplement  30 mL Oral TID WC  . gabapentin  100 mg Oral Daily  . lanthanum  500 mg Oral TID WC  . metroNIDAZOLE  500 mg Oral Q8H  . midodrine  10 mg Oral TID WC  . multivitamin  1 tablet Oral Daily  . omega-3 acid ethyl esters  1 g Oral Daily  . sodium chloride  3 mL Intravenous Q12H  . [START ON 02/07/2013] vancomycin  1,000 mg Intravenous Q M,W,F-HD

## 2013-02-05 NOTE — Progress Notes (Signed)
Subjective: Pt states he tolerated the wound care much better today. Just had hydrotherapy yesterday. No complaints.    Objective: Vital signs in last 24 hours: Temp:  [97.6 F (36.4 C)-98.9 F (37.2 C)] 98.3 F (36.8 C) (06/28 1030) Pulse Rate:  [78-91] 83 (06/28 1030) Resp:  [15-18] 18 (06/28 1030) BP: (106-153)/(47-69) 106/47 mmHg (06/28 1030) SpO2:  [98 %-100 %] 100 % (06/28 1030) Weight:  [171 lb 15.3 oz (78 kg)] 171 lb 15.3 oz (78 kg) (06/27 2039) Last BM Date: 02/04/13  Intake/Output from previous day: 06/27 0701 - 06/28 0700 In: 390 [P.O.:240; IV Piggyback:150] Out: 1799 [Urine:200] Intake/Output this shift:    PE: Gen: Alert, NAD, pleasant  Buttock: 10cm x 10cm, 4cm deep, less necrotic slough, more granulation tissue forming right better than left side, less pain   Lab Results:   Recent Labs  02/03/13 0525 02/04/13 0752  WBC 16.0* 15.1*  HGB 8.6* 9.2*  HCT 27.4* 27.9*  PLT 493* 509*   BMET  Recent Labs  02/03/13 0525 02/04/13 0752  NA 136 132*  K 3.9 3.7  CL 99 95*  CO2 30 25  GLUCOSE 96 111*  BUN 29* 44*  CREATININE 3.75* 5.30*  CALCIUM 9.3 9.2   PT/INR No results found for this basename: LABPROT, INR,  in the last 72 hours CMP     Component Value Date/Time   NA 132* 02/04/2013 0752   K 3.7 02/04/2013 0752   CL 95* 02/04/2013 0752   CO2 25 02/04/2013 0752   GLUCOSE 111* 02/04/2013 0752   BUN 44* 02/04/2013 0752   CREATININE 5.30* 02/04/2013 0752   CALCIUM 9.2 02/04/2013 0752   CALCIUM 9.9 09/24/2007 1113   PROT 7.4 01/29/2013 0848   ALBUMIN 2.0* 02/04/2013 0752   AST 45* 01/29/2013 0848   ALT 35 01/29/2013 0848   ALKPHOS 71 01/29/2013 0848   BILITOT 0.4 01/29/2013 0848   GFRNONAA 9* 02/04/2013 0752   GFRAA 11* 02/04/2013 0752   Lipase     Component Value Date/Time   LIPASE 41 01/29/2013 0015       Studies/Results: No results found.  Anti-infectives: Anti-infectives   Start     Dose/Rate Route Frequency Ordered Stop   02/07/13  1200  vancomycin (VANCOCIN) IVPB 1000 mg/200 mL premix     1,000 mg 200 mL/hr over 60 Minutes Intravenous Every M-W-F (Hemodialysis) 02/04/13 1616     02/04/13 1800  ceFAZolin (ANCEF) IVPB 2 g/50 mL premix     2 g 100 mL/hr over 30 Minutes Intravenous Every M-W-F (Hemodialysis) 02/02/13 1520 03/18/13 1159   02/03/13 1400  metroNIDAZOLE (FLAGYL) tablet 500 mg     500 mg Oral 3 times per day 02/03/13 1105     02/02/13 1530  metroNIDAZOLE (FLAGYL) tablet 500 mg  Status:  Discontinued     500 mg Oral 3 times per day 02/02/13 1520 02/02/13 1520   01/31/13 2200  vancomycin (VANCOCIN) 50 mg/mL oral solution 125 mg  Status:  Discontinued     125 mg Oral Daily 01/31/13 2116 02/03/13 1105   01/31/13 2000  ceFEPIme (MAXIPIME) 2 g in dextrose 5 % 50 mL IVPB  Status:  Discontinued     2 g 100 mL/hr over 30 Minutes Intravenous Every M-W-F (2000) 01/29/13 0546 02/02/13 1032   01/31/13 1200  vancomycin (VANCOCIN) IVPB 750 mg/150 ml premix  Status:  Discontinued     750 mg 150 mL/hr over 60 Minutes Intravenous Every M-W-F (Hemodialysis) 01/29/13 0546 02/04/13 1616  01/29/13 0600  vancomycin (VANCOCIN) IVPB 750 mg/150 ml premix     750 mg 150 mL/hr over 60 Minutes Intravenous  Once 01/29/13 0545 01/29/13 0958   01/29/13 0600  ceFEPIme (MAXIPIME) 2 g in dextrose 5 % 50 mL IVPB     2 g 100 mL/hr over 30 Minutes Intravenous  Once 01/29/13 0546 01/29/13 0909   01/29/13 0045  vancomycin (VANCOCIN) IVPB 1000 mg/200 mL premix     1,000 mg 200 mL/hr over 60 Minutes Intravenous  Once 01/29/13 0033 01/29/13 0226   01/29/13 0045  piperacillin-tazobactam (ZOSYN) IVPB 2.25 g     2.25 g 100 mL/hr over 30 Minutes Intravenous  Once 01/29/13 0033 01/29/13 0333   01/29/13 0045  gentamicin (GARAMYCIN) IVPB 100 mg     100 mg 200 mL/hr over 30 Minutes Intravenous  Once 01/29/13 0043 01/29/13 0442       Assessment/Plan Large sacral decub s/p bedside debridement  1. Cont wound care, santyl, and hydrotherapy  2.  Ambulate and IS  3. SCD's  4. Bedside wound assessment today overall better, some less slough with santyl and hydrotherapy 5. May need re-debridement in a few days if hydrotherapy doesn't continue to improve the left side of the decub  MRSA sepsis/leukocytosis - down to 13.4 Protein calorie malnutrition, severe     LOS: 7 days    [name] 02/05/2013, 11:10 AM Pager: [phone]

## 2013-02-05 NOTE — Evaluation (Signed)
Clinical/Bedside Swallow Evaluation Patient Details  Name: [name] MRN: [id number] Date of Birth: [birth year]  Today's Date: 02/05/2013 Time: 1208-1243 SLP Time Calculation (min): 35 min  Past Medical History:  Past Medical History  Diagnosis Date  . Hypertension   . Diabetes mellitus     typeII / No meds  . Cataract 01/2002    Right  . Dialysis patient 10/13/2007  . Radiculopathy 05/14/2006    NCV study negative carpal tunnel, Left C5 radiculopathy  . Arthritis     Knee pain  . ESRD (end stage renal disease) on dialysis 2009    [facility] diaylsis Monday , Wed. and Friday per Dr. Kathrene Bongo  . Stroke   . Elevated PSA     h/o, pt had declined further eval.   . Syncope 11/02/2012   Past Surgical History:  Past Surgical History  Procedure Laterality Date  . Varicose vein surgery  1978  . External fixation wrist fracture  1990  . Thyroidectomy, partial  08/03/2003    Right thyroid lobectomy, subtotal parathyr. sec hyperparthyroidismadenosis  . Av fistula placement  07/2004    Left arm AV fistula placement [name] via [facility])  . Eye surgery      Cataract   HPI:  79 y.o. male history of ESRD on hemodialysis who was recently admitted (5/19 - 01/10/2013) for sepsis secondary to C. difficile colitis brought back to the ER from nursing home 6/21 after patient was found to be hypotensive. On arrival patient also was found to be febrile, tachycardic, and hemoglobin low from his baseline. Patient was started on empiric antibiotics after blood cultures were obtained. Patient's sacral decubitus had a discharge on exam at presentation. Patient was found to be hypotensive and was given 1 L normal saline bolus and was started on empiric antibiotics along with PRBC transfusion. Patient's family had a detailed discussion with ER physician Dr. Arnoldo Morale and they requested to continue with DO NOT RESUSCITATE status and no vasopressors. Pt has been through a palliative meeting with  plans for full medical treatment of wounds and for hemodialysis and pt remains a DNR.  SLP received order for swallow evaluation secondary to pt having dysphagia and pocketing of pills.  Pt's RN Synetta Fail reports she fed pt breakfast today with good tolerance and his medications are being given to him crushed.  Pt denies problems swallwoing but has cognitive deficits (per chart review - suspected vascular dementia).     Assessment / Plan / Recommendation Clinical Impression  Pt presents with mild oral dysphagia consistent with cognitive deficits resulting in delayed oral transiting, decreased mastication abilities and oral pocketing.  Pt able to clear pocketing with verbal cues to swallow and liquid consumption.  No clinical indications of aspiration noted during meal - pt is verbose and required frequent verbal cues to cease talking to focus on intake.  Rec continue his diet with chopped meats and  intermittent supervision to assure oral cavity clear after meals.  Also agree with medicine with pureed crushed due to oral deficits impacting swallow ability to manage mixed consistencies.   Question if pt's HD causes him fatigue and increases his baseline mild swallowing difficulties.  SLP educated pt to precautions and strategies to decrease aspiration risk, he verbalized understanding but suspect generalization will be compromised given his cognition.  SLP to follow up x1 for family education due to report of difficulties per order.  Thanks for the consult.     Aspiration Risk  Mild    Diet Recommendation  Regular;Thin liquid (chopped meats)   Liquid Administration via: Straw Medication Administration: Crushed with puree Supervision: Intermittent supervision to cue for compensatory strategies Compensations: Slow rate;Small sips/bites;Check for pocketing Postural Changes and/or Swallow Maneuvers: Seated upright 90 degrees;Upright 30-60 min after meal    Other  Recommendations Oral Care Recommendations:  Oral care QID   Follow Up Recommendations   (tbd)    Frequency and Duration min 1 x/week  1 week   Pertinent Vitals/Pain Afebrile,decreased    SLP Swallow Goals Patient will utilize recommended strategies during swallow to increase swallowing safety with: Minimal assistance   Swallow Study Prior Functional Status   unknown, family not present, pt denies h/o dysphagia    General HPI: 79 y.o. male history of ESRD on hemodialysis who was recently admitted (5/19 - 01/10/2013) for sepsis secondary to C. difficile colitis brought back to the ER from nursing home 6/21 after patient was found to be hypotensive. On arrival patient also was found to be febrile, tachycardic, and hemoglobin low from his baseline. Patient was started on empiric antibiotics after blood cultures were obtained. Patient's sacral decubitus had a discharge on exam at presentation. Patient was found to be hypotensive and was given 1 L normal saline bolus and was started on empiric antibiotics along with PRBC transfusion. Patient's family had a detailed discussion with ER physician Dr. Arnoldo Morale and they requested to continue with DO NOT RESUSCITATE status and no vasopressors. Pt has been through a palliative meeting with plans for full medical treatment of wounds and for hemodialysis and pt remains a DNR.  SLP received order for swallow evaluation secondary to pt having dysphagia and pocketing of pills.  Pt's RN Synetta Fail reports she fed pt breakfast today with good tolerance and his medications are being given to him crushed.  Pt denies problems swallwoing but has cognitive deficits (per chart review - suspected vascular dementia).   Type of Study: Bedside swallow evaluation Diet Prior to this Study: Regular;Thin liquids (chopped meat renal diet) Temperature Spikes Noted: No Respiratory Status: Room air History of Recent Intubation: No Behavior/Cognition: Alert;Cooperative;Pleasant mood Oral Cavity - Dentition: Adequate natural  dentition Self-Feeding Abilities: Needs set up Patient Positioning: Upright in bed Baseline Vocal Quality: Low vocal intensity;Clear Volitional Cough: Strong Volitional Swallow: Unable to elicit    Oral/Motor/Sensory Function Overall Oral Motor/Sensory Function: Appears within functional limits for tasks assessed   Ice Chips Ice chips: Not tested   Thin Liquid Thin Liquid: Within functional limits Presentation: Straw;Self Fed    Nectar Thick Nectar Thick Liquid: Not tested   Honey Thick Honey Thick Liquid: Not tested   Puree Puree: Within functional limits Presentation: Spoon   Solid   GO    Solid: Impaired Oral Phase Impairments: Impaired anterior to posterior transit Oral Phase Functional Implications: Oral residue;Left lateral sulci pocketing Other Comments: mild oral pocketing most notably on left, effectively clears with cued swallows and liquids       Donavan Burnet, MS [facility] SLP [phone]

## 2013-02-05 NOTE — Progress Notes (Signed)
General surgery attending note:  Stable and alert. Confused but not agitated.  Hydrotherapy and dressing change have already been performed this morning. We were not present, and so we will not change the dressing again at this point in time.  Orders left for [place] to be paged so we can check wound at the next dressing change.   [name]. Derrell Lolling, M.D., [facility] General and Minimally invasive Surgery Breast and Colorectal Surgery Office:   [phone] Pager:   [phone]

## 2013-02-05 NOTE — Progress Notes (Signed)
Physical Therapy Wound Treatment Patient Details  Name: [name] MRN: [id number] Date of Birth: [birth year]  Today's Date: 02/05/2013 Time:  -     Subjective  Patient and Family Stated Goals: not stated Prior Treatments: pulsatile lavage, debridement  Pain Score: Pain Score:   3  Wound Assessment  Pressure Ulcer 12/27/12 Unstageable - Full thickness tissue loss in which the base of the ulcer is covered by slough (yellow, tan, gray, green or brown) and/or eschar (tan, brown or black) in the wound bed. (Active)  State of Healing Non-healing 02/05/2013  9:57 AM  Site / Wound Assessment Yellow;Pink;Red 02/05/2013  9:57 AM  % Wound base Red or Granulating 35% 02/05/2013  9:57 AM  % Wound base Yellow 65% 02/05/2013  9:57 AM  % Wound base Black 0% 02/05/2013  9:57 AM  % Wound base Other (Comment) 0% 02/05/2013  9:57 AM  Peri-wound Assessment Purple 02/04/2013  2:24 PM  Wound Length (cm) 10.5 cm 02/04/2013  2:24 PM  Wound Width (cm) 10 cm 02/04/2013  2:24 PM  Wound Depth (cm) 4 cm 02/04/2013  2:24 PM  Tunneling (cm) 0 02/02/2013 12:30 PM  Undermining (cm) 1 02/02/2013 12:30 PM  Margins Epibole (rolled edges) 02/05/2013  9:57 AM  Drainage Amount Minimal 02/05/2013  9:57 AM  Drainage Description Serosanguineous;Odor;Green 02/05/2013  9:57 AM  Treatment Debridement (Selective);Hydrotherapy (Ultrasonic mist);Packing (Saline gauze) 02/05/2013  9:57 AM  Dressing Type Gauze (Comment);Barrier Film (skin prep);Foam 02/05/2013  9:57 AM  Dressing Clean;Dry;Intact 02/05/2013  9:57 AM     Incision 04/13/12 Arm Left (Active)   Hydrotherapy Ultrasonic mist  - wound location: sacrum Ultrasonic mist at 35KHz (+/-3KHz) at ___ percent: 100 % Ultrasonic mist therapy minutes: 7 min Selective Debridement Selective Debridement - Location: sacrum Selective Debridement - Tools Used: Forceps;Scissors Selective Debridement - Tissue Removed: yellow slough   Wound Assessment and Plan  Wound Therapy -  Assess/Plan/Recommendations Wound Therapy - Clinical Statement: Pt with large sacral wound that can benefit from hydrotherapy to decr necrotic tissue and promote healing. Wound Therapy - Functional Problem List: immobility Factors Delaying/Impairing Wound Healing: Diabetes Mellitus;Altered sensation;Immobility Hydrotherapy Plan: Debridement;Dressing change;Patient/family education;Ultrasonic wound therapy @35  KHz (+/- 3 KHz) Wound Therapy - Frequency: 6X / week Wound Therapy - Follow Up Recommendations: Skilled nursing facility Wound Plan: decr. bacterial load and selectively debide to healthy tissues  Wound Therapy Goals- Improve the function of patient's integumentary system by progressing the wound(s) through the phases of wound healing (inflammation - proliferation - remodeling) by: Decrease Necrotic Tissue to: 30 Decrease Necrotic Tissue - Progress: Progressing toward goal Increase Granulation Tissue to: 70 Increase Granulation Tissue - Progress: Progressing toward goal Decrease Length/Width/Depth by (cm): .3 Decrease Length/Width/Depth - Progress: Progressing toward goal Improve Drainage Characteristics: Serous Improve Drainage Characteristics - Progress: Progressing toward goal  Goals will be updated until maximal potential achieved or discharge criteria met.  Discharge criteria: when goals achieved, discharge from hospital, MD decision/surgical intervention, no progress towards goals, refusal/missing three consecutive treatments without notification or medical reason.  GP     Sallyanne Kuster 02/05/2013, 12:24 PM Sallyanne Kuster, PTA Office- [phone]

## 2013-02-06 LAB — RENAL FUNCTION PANEL
Albumin: 1.9 g/dL — ABNORMAL LOW (ref 3.5–5.2)
BUN: 47 mg/dL — ABNORMAL HIGH (ref 6–23)
Calcium: 8.8 mg/dL (ref 8.4–10.5)
Phosphorus: 4.6 mg/dL (ref 2.3–4.6)
Potassium: 4.3 mEq/L (ref 3.5–5.1)

## 2013-02-06 LAB — CBC
HCT: 27.9 % — ABNORMAL LOW (ref 39.0–52.0)
MCHC: 31.9 g/dL (ref 30.0–36.0)
RDW: 17.3 % — ABNORMAL HIGH (ref 11.5–15.5)

## 2013-02-06 NOTE — Progress Notes (Signed)
General Surgery:  Agree with assessment and treatment plan outlined by Ms. [name], PA. Continue bedside care  [facility]. Derrell Lolling, M.D., [facility] General and Minimally invasive Surgery Breast and Colorectal Surgery Office:   [phone] Pager:   [phone]

## 2013-02-06 NOTE — Progress Notes (Signed)
[facility] Progress Note  Subjective:   "I feel pretty good".  Eating breakfast. Oriented person and place.  Objective Filed Vitals:   02/05/13 0556 02/05/13 1030 02/05/13 1901 02/06/13 0546  BP: 133/61 106/47 125/51 123/50  Pulse: 78 83 83 82  Temp: 98.4 F (36.9 C) 98.3 F (36.8 C) 98.7 F (37.1 C) 98.2 F (36.8 C)  TempSrc: Oral Oral Oral Oral  Resp: 18 18 18 18   Height:      Weight:      SpO2: 98% 100% 98% 98%   Physical Exam General: Pleasantly confused. NAD Heart: RRR Lungs: Grossly CTA. No wheezes, rhonchi noted Abdomen: soft, NT, non-distended. Normal BS Extremities: Soft boots/SCDs. Pedal edema. No pretibial edema. Dialysis Access: L AVF  Outpatient HD: MWF [facility] 4hrs F180 500/A1.5 EDW 77.5kg 2K/2Ca Bath LUA AVF EPO 6200 Heparin 5000   Assessment/Plan: 1. MRSA bacteremia/ treat as presumed osteo per ID - on Vanc x 6 weeks+ ancef; Leukocytosis improving; TTE with EF 55-60%, no regurg or concerns for vegetation  2. ESRD - MWF -family wishes to continue - see #9; Continue to try dialysis in a recliner to ensure he can tolerate. Next HD Monday 3. Anemia - Hgb improving, now 8.9 < 9.2 s/p transfusion. Aranesp 60 q Monday. Follow CBC 4. Secondary hyperparathyroidism - on sensipar and binders; P 4.6  5. Hypotension/volume - BP limiting volume removal - on midodrine , Net UF ~1.6 L Friday - post weight 78kg. Close to EDW but ? accuracy of bed scales.  6. Nutrition - Alb 1.9; renal diet; nepro bid, prostat  7. Sacral decub - Hydrotherapy and local care. Per surgery, left side of decub by require re-debridement if wound does not improve in next few days ; nutrition, antibioitcs  8. Hx c diff + 5/20 - on po flagyl  9. Dispo/ EOL issues - Palliative on board. Per family conf - plan is to continue HD and medical care; prognosis for recovery very poor; prognosis for prolonged suffering is great; he would need to be able to dialyze in a recliner for 4 hrs plus  transport time in order to be discharged. Would be ok for discharge from renal standpoint if no further debridements needed and sufficient wound care could be performed at SNF.   Scot Jun. Thad Ranger [facility] Pager [phone] 02/06/2013,10:01 AM  LOS: 8 days  I have seen and examined this patient and agree with plan per Claud Kelp. No new CO   Cont Vanco. Plan HD in AM. Race Latour T,MD 02/06/2013 10:41 AM  Additional Objective Labs: Basic Metabolic Panel:  Recent Labs Lab 02/02/13 0704 02/03/13 0525 02/04/13 0752 02/06/13 0605  NA 133* 136 132* 134*  K 4.3 3.9 3.7 4.3  CL 94* 99 95* 96  CO2 25 30 25 28   GLUCOSE 88 96 111* 94  BUN 44* 29* 44* 47*  CREATININE 5.56* 3.75* 5.30* 5.35*  CALCIUM 9.1 9.3 9.2 8.8  PHOS 5.0*  --  4.7* 4.6   Liver Function Tests:  Recent Labs Lab 02/02/13 0704 02/04/13 0752 02/06/13 0605  ALBUMIN 1.8* 2.0* 1.9*   No results found for this basename: LIPASE, AMYLASE,  in the last 168 hours CBC:  Recent Labs Lab 02/01/13 0510 02/02/13 0704 02/03/13 0525 02/04/13 0752 02/06/13 0605  WBC 18.8* 18.0* 16.0* 15.1* 13.4*  HGB 8.9* 8.6* 8.6* 9.2* 8.9*  HCT 27.2* 25.9* 27.4* 27.9* 27.9*  MCV 82.2 81.7 83.8 82.5 84.0  PLT 451* 489* 493* 509*  473*   Blood Culture    Component Value Date/Time   SDES BLOOD RIGHT HAND 02/02/2013 1540   SPECREQUEST BOTTLES DRAWN AEROBIC ONLY 10CC 02/02/2013 1540   CULT        BLOOD CULTURE RECEIVED NO GROWTH TO DATE CULTURE WILL BE HELD FOR 5 DAYS BEFORE ISSUING A FINAL NEGATIVE REPORT 02/02/2013 1540   REPTSTATUS PENDING 02/02/2013 1540    Cardiac Enzymes: No results found for this basename: CKTOTAL, CKMB, CKMBINDEX, TROPONINI,  in the last 168 hours CBG:  Recent Labs Lab 02/02/13 1228  GLUCAP 88   Medications:   .  ceFAZolin (ANCEF) IV  2 g Intravenous Q M,W,F-HD  . cinacalcet  30 mg Oral BID WC  . collagenase   Topical Daily  . darbepoetin (ARANESP) injection - DIALYSIS  60  mcg Intravenous Q Mon-HD  . feeding supplement (NEPRO CARB STEADY)  237 mL Oral BID BM  . feeding supplement  30 mL Oral TID WC  . gabapentin  100 mg Oral Daily  . lanthanum  500 mg Oral TID WC  . metroNIDAZOLE  500 mg Oral Q8H  . midodrine  10 mg Oral TID WC  . multivitamin  1 tablet Oral Daily  . omega-3 acid ethyl esters  1 g Oral Daily  . sodium chloride  3 mL Intravenous Q12H  . [START ON 02/07/2013] vancomycin  1,000 mg Intravenous Q M,W,F-HD

## 2013-02-06 NOTE — Progress Notes (Signed)
[facility] Progress Note   [name] VZD:[id number] DOB: 1931/01/14 DOA: 01/29/2013 PCP: Crawford Givens, MD  Brief narrative: 79 y.o. male history of ESRD on hemodialysis who was recently admitted (5/19 - 01/10/2013) for sepsis secondary to C. difficile colitis brought back to the ER from nursing home 6/21 after patient was found to be hypotensive. On arrival patient also was found to be febrile, tachycardic, and hemoglobin low from his baseline. Patient was started on empiric antibiotics after blood cultures were obtained. Patient's sacral decubitus had a discharge on exam at presentation. Patient was found to be hypotensive and was given 1 L normal saline bolus and was started on empiric antibiotics along with PRBC transfusion. Patient's family had a detailed discussion with ER physician Dr. Arnoldo Morale and they requested to continue with DO NOT RESUSCITATE status and no vasopressors. Family discussion with palliative care is they would like to continue with HD and IV abx and treatment of sacral wound  Assessment/Plan:  MRSA Sepsis / leukocytosis - suspected source of entry is sacral decubitus - hypotension improved, fevers resolved  - blood cx both + for MRSA (2/2) - repeat cultures 6/25 recs per ID:   TTE to evaluate for vegetations shows none- no need for TEE as patient is going to be treated - presumed osteomyelitis:  he will need to vancomycin x 6 wks in addn to gram negative (ancef) and flagyl  - c.difficile infection- resolved- giving flagyl - likely needs PICC line early next week prior to d/c Decubitus ulcer of sacral region, stage 4 - wound care consult performed - wounds are felt to be beyond conservative tx at this time - Palliative Care consult requested to establish GOC with family - they along with the patient wish to continue on; general surgery consulted and bedside debridement done with good results- recommending hydrotherapy- - appreciate surgery input, possible  debridement in few days.  End stage renal disease - cont dialysis per Nephrology for now  Anemia of chronic disease - stable Protein-calorie malnutrition, severe - nutritional supplements DM CBG reasonably controlled at present time  Hx of HTN Hx of CVA with suspected vascular dementia Decubitus ulcer of R heel Severe protein calorie malnutrition  Code Status: DNR Family Communication: daughter at bedside Disposition Plan: SNF early next week, based on surgical plan  Consultants: Nephro Palliative Care SLP surgery  Procedures: none  Antibiotics: Vanc 6/20 >> Cefepime---- >Ancef 6/25 >> Flagyl 6/25 >>  DVT prophylaxis: SCDs + SQ heparin   HPI/Subjective: - feeling well this morning, denies complaints.   Objective: Blood pressure 123/50, pulse 82, temperature 98.2 F (36.8 C), temperature source Oral, resp. rate 18, height 6\' 6"  (1.981 m), weight 78 kg (171 lb 15.3 oz), SpO2 98.00%.  Intake/Output Summary (Last 24 hours) at 02/06/13 0817 Last data filed at 02/06/13 0553  Gross per 24 hour  Intake    723 ml  Output    425 ml  Net    298 ml   Exam: General: No acute respiratory distress Lungs: Clear to auscultation bilaterally without wheezes or crackles Cardiovascular: Tachycardic but regular without appreciable murmur or gallop Abdomen: Nontender, nondistended, soft, bowel sounds positive, no rebound, no ascites, no appreciable mass Extremities: No significant cyanosis, clubbing, or edema bilateral lower extremities Skin: large stage 4 decubitus ulcer   Data Reviewed: Basic Metabolic Panel:  Recent Labs Lab 01/30/13 0830 02/02/13 0704 02/03/13 0525 02/04/13 0752 02/06/13 0605  NA 132* 133* 136 132* 134*  K 4.5 4.3 3.9 3.7 4.3  CL 95* 94* 99 95* 96  CO2 25 25 30 25 28   GLUCOSE 118* 88 96 111* 94  BUN 52* 44* 29* 44* 47*  CREATININE 5.04* 5.56* 3.75* 5.30* 5.35*  CALCIUM 8.9 9.1 9.3 9.2 8.8  PHOS  --  5.0*  --  4.7* 4.6   Liver Function  Tests:  Recent Labs Lab 02/02/13 0704 02/04/13 0752 02/06/13 0605  ALBUMIN 1.8* 2.0* 1.9*   CBC:  Recent Labs Lab 02/01/13 0510 02/02/13 0704 02/03/13 0525 02/04/13 0752 02/06/13 0605  WBC 18.8* 18.0* 16.0* 15.1* 13.4*  HGB 8.9* 8.6* 8.6* 9.2* 8.9*  HCT 27.2* 25.9* 27.4* 27.9* 27.9*  MCV 82.2 81.7 83.8 82.5 84.0  PLT 451* 489* 493* 509* 473*   CBG:  Recent Labs Lab 02/02/13 1228  GLUCAP 88    Recent Results (from the past 240 hour(s))  CULTURE, BLOOD (ROUTINE X 2)     Status: None   Collection Time    01/29/13 12:30 AM      Result Value Range Status   Specimen Description BLOOD RIGHT ARM   Final   Special Requests BOTTLES DRAWN AEROBIC AND ANAEROBIC 10CC EACH   Final   Culture  Setup Time 01/29/2013 15:10   Final   Culture     Final   Value: METHICILLIN RESISTANT STAPHYLOCOCCUS AUREUS     Note: RIFAMPIN AND GENTAMICIN SHOULD NOT BE USED AS SINGLE DRUGS FOR TREATMENT OF STAPH INFECTIONS. CRITICAL RESULT CALLED TO, READ BACK BY AND VERIFIED WITH: ANNA AGUILAR 01/31/13 1440 BY SMITHERSJ     Note: Gram Stain Report Called to,Read Back By and Verified With: BERNADETTE LOMCALLO 01/30/13 @ 1:14PM BY RUSCA.   Report Status 02/01/2013 FINAL   Final   Organism ID, Bacteria METHICILLIN RESISTANT STAPHYLOCOCCUS AUREUS   Final  CULTURE, BLOOD (ROUTINE X 2)     Status: None   Collection Time    01/29/13 12:45 AM      Result Value Range Status   Specimen Description BLOOD LEFT HAND   Final   Special Requests BOTTLES DRAWN AEROBIC ONLY 10CC   Final   Culture  Setup Time 01/29/2013 15:11   Final   Culture     Final   Value: STAPHYLOCOCCUS AUREUS     Note: SUSCEPTIBILITIES PERFORMED ON PREVIOUS CULTURE WITHIN THE LAST 5 DAYS.     Note: Gram Stain Report Called to,Read Back By and Verified With: BERNADETTE [facility] 01/30/13 @ 1:14PM BY RUSCA.   Report Status 02/01/2013 FINAL   Final  URINE CULTURE     Status: None   Collection Time    01/29/13  1:56 AM      Result Value Range  Status   Specimen Description URINE, RANDOM   Final   Special Requests NONE   Final   Culture  Setup Time 01/29/2013 15:24   Final   Colony Count NO GROWTH   Final   Culture NO GROWTH   Final   Report Status 01/30/2013 FINAL   Final  MRSA PCR SCREENING     Status: None   Collection Time    01/29/13  5:30 AM      Result Value Range Status   MRSA by PCR NEGATIVE  NEGATIVE Final   Comment:            The GeneXpert MRSA Assay (FDA     approved for NASAL specimens     only), is one component of a     comprehensive MRSA colonization  surveillance program. It is not     intended to diagnose MRSA     infection nor to guide or     monitor treatment for     MRSA infections.  CULTURE, BLOOD (ROUTINE X 2)     Status: None   Collection Time    02/02/13  3:30 PM      Result Value Range Status   Specimen Description BLOOD RIGHT ARM   Final   Special Requests BOTTLES DRAWN AEROBIC AND ANAEROBIC 10CC   Final   Culture  Setup Time 02/03/2013 01:15   Final   Culture     Final   Value:        BLOOD CULTURE RECEIVED NO GROWTH TO DATE CULTURE WILL BE HELD FOR 5 DAYS BEFORE ISSUING A FINAL NEGATIVE REPORT   Report Status PENDING   Incomplete  CULTURE, BLOOD (ROUTINE X 2)     Status: None   Collection Time    02/02/13  3:40 PM      Result Value Range Status   Specimen Description BLOOD RIGHT HAND   Final   Special Requests BOTTLES DRAWN AEROBIC ONLY 10CC   Final   Culture  Setup Time 02/03/2013 01:15   Final   Culture     Final   Value:        BLOOD CULTURE RECEIVED NO GROWTH TO DATE CULTURE WILL BE HELD FOR 5 DAYS BEFORE ISSUING A FINAL NEGATIVE REPORT   Report Status PENDING   Incomplete    Scheduled Meds: .  ceFAZolin (ANCEF) IV  2 g Intravenous Q M,W,F-HD  . cinacalcet  30 mg Oral BID WC  . collagenase   Topical Daily  . darbepoetin (ARANESP) injection - DIALYSIS  60 mcg Intravenous Q Mon-HD  . feeding supplement (NEPRO CARB STEADY)  237 mL Oral BID BM  . feeding supplement  30 mL  Oral TID WC  . gabapentin  100 mg Oral Daily  . lanthanum  500 mg Oral TID WC  . metroNIDAZOLE  500 mg Oral Q8H  . midodrine  10 mg Oral TID WC  . multivitamin  1 tablet Oral Daily  . omega-3 acid ethyl esters  1 g Oral Daily  . sodium chloride  3 mL Intravenous Q12H  . [START ON 02/07/2013] vancomycin  1,000 mg Intravenous Q M,W,F-HD   Time spent on care of this patient: 25 min  Pamella Pert, MD [facility] [phone] If 7PM-7AM, please contact night-coverage, [email or web address], Password [facility] 02/06/2013, 8:17 AM   LOS: 8 days

## 2013-02-07 DIAGNOSIS — K921 Melena: Secondary | ICD-10-CM

## 2013-02-07 DIAGNOSIS — M549 Dorsalgia, unspecified: Secondary | ICD-10-CM

## 2013-02-07 LAB — CBC
MCH: 26.3 pg (ref 26.0–34.0)
MCHC: 31.8 g/dL (ref 30.0–36.0)
MCV: 82.7 fL (ref 78.0–100.0)
Platelets: 464 10*3/uL — ABNORMAL HIGH (ref 150–400)
RDW: 17.2 % — ABNORMAL HIGH (ref 11.5–15.5)
WBC: 13.2 10*3/uL — ABNORMAL HIGH (ref 4.0–10.5)

## 2013-02-07 LAB — BASIC METABOLIC PANEL
Calcium: 8.7 mg/dL (ref 8.4–10.5)
Creatinine, Ser: 6.33 mg/dL — ABNORMAL HIGH (ref 0.50–1.35)
GFR calc Af Amer: 9 mL/min — ABNORMAL LOW (ref >90)
GFR calc non Af Amer: 7 mL/min — ABNORMAL LOW (ref >90)

## 2013-02-07 MED ORDER — DARBEPOETIN ALFA-POLYSORBATE 100 MCG/0.5ML IJ SOLN
100.0000 ug | INTRAMUSCULAR | Status: DC
  Filled 2013-02-07 (×2): qty 0.5

## 2013-02-07 NOTE — Evaluation (Signed)
Physical Therapy Evaluation Patient Details Name: [name] MRN: [id number] DOB: 1931/06/03 Today's Date: 02/07/2013 Time: 8657-8469 PT Time Calculation (min): 10 min  PT Assessment / Plan / Recommendation History of Present Illness     Clinical Impression  Pt admitted with sepsis. Pt currently with functional limitations due to the deficits listed below (see PT Problem List).  Pt will benefit from skilled PT to increase their independence and safety with mobility to allow discharge to SNF and decr burden of care.      PT Assessment  Patient needs continued PT services    Follow Up Recommendations  SNF    Does the patient have the potential to tolerate intense rehabilitation      Barriers to Discharge        Equipment Recommendations  None recommended by PT    Recommendations for Other Services     Frequency Min 2X/week    Precautions / Restrictions Precautions Precautions: Fall   Pertinent Vitals/Pain Grimace with mobility      Mobility  Bed Mobility Rolling Right: 1: +2 Total assist Rolling Right: Patient Percentage: 10% Rolling Left: 1: +2 Total assist Rolling Left: Patient Percentage: 10% Left Sidelying to Sit: 1: +2 Total assist Left Sidelying to Sit: Patient Percentage: 0% Sitting - Scoot to Edge of Bed: 1: +2 Total assist Sitting - Scoot to Edge of Bed: Patient Percentage: 0%    Exercises     PT Diagnosis: Generalized weakness  PT Problem List: Decreased strength;Decreased range of motion;Decreased activity tolerance;Decreased mobility;Decreased knowledge of use of DME;Decreased knowledge of precautions;Impaired tone PT Treatment Interventions: Functional mobility training;Therapeutic activities;Balance training;Patient/family education     PT Goals(Current goals can be found in the care plan section) Acute Rehab PT Goals Patient Stated Goal: Not stated. PT Goal Formulation: Patient unable to participate in goal setting Time For Goal  Achievement: 02/21/13 Potential to Achieve Goals: Fair  Visit Information  Last PT Received On: 02/07/13 Assistance Needed: +2       Prior Functioning  Home Living Family/patient expects to be discharged to:: Skilled nursing facility Prior Function Level of Independence: Needs assistance    Cognition  Cognition Arousal/Alertness: Awake/alert Behavior During Therapy: Anxious Overall Cognitive Status: No family/caregiver present to determine baseline cognitive functioning    Extremity/Trunk Assessment Lower Extremity Assessment Lower Extremity Assessment: RLE deficits/detail RLE Deficits / Details: Resistant to movement. Difficulty following instructions.  Limited functional movement   Balance Static Sitting Balance Static Sitting - Balance Support: Feet supported;Bilateral upper extremity supported Static Sitting - Level of Assistance: 1: +2 Total assist Static Sitting - Comment/# of Minutes: Pt pushing into extension.  End of Session PT - End of Session Activity Tolerance: Patient limited by fatigue Patient left: in bed;with call bell/phone within reach;with bed alarm set  GP     [facility] 02/07/2013, 4:36 PM  [facility] PT [phone]

## 2013-02-07 NOTE — Progress Notes (Signed)
SLP Cancellation Note  Patient Details Name: Jamisen Duerson MRN: [id number] DOB: Nov 07, 1930   Cancelled treatment:       Reason Eval/Treat Not Completed: Other (comment) (pt at dialysis, will reattempt at later time)   Donavan Burnet, MS [facility] SLP [phone]

## 2013-02-07 NOTE — Progress Notes (Signed)
[name] Progress Note  Subjective:   No c/o  Objective Filed Vitals:   02/07/13 0742 02/07/13 0756 02/07/13 0807 02/07/13 0830  BP: 120/67 124/66 85/48 100/57  Pulse: 80 80 79 65  Temp: 96.8 F (36 C)     TempSrc: Oral     Resp: 18 16 18 16   Height:      Weight:      SpO2: 97%      Physical Exam HD - goal decreased to 1.5 due to BP drop - changed to profile 4; not weighed pre HD General: NAD Heart: RRR Lungs: no wheezes or rales Abdomen: + BS soft - no dependent edema Extremities: soft boots on feet; no significant edmea Dialysis Access: left AVF Qb 500  Outpatient HD: MWF [facility] 4hrs F180 500/A1.5 EDW 77.5kg 2K/2Ca Bath LUA AVF EPO 6200 Heparin 5000   Assessment/Plan:  1. MRSA bacteremia/ treating as presumed osteo per ID - on Vanc x 6 weeks+ ancef; Leukocytosis improving; TTE with EF 55-60%, no regurg or concerns for vegetation  2. ESRD - MWF -family wishes to continue - see #9; In a recliner to ensure he can tolerate outpt HD K 4.3 3. Anemia - Hgb improving, now 9 stable   s/p transfusion. ^ Aranesp  To 100 q Monday. Follow CBC 4. Secondary hyperparathyroidism - on sensipar and binders; P 4.6  5. Hypotension/volume - BP limiting volume removal - on midodrine , Net UF varied from 1 - 1.9 last week;  6. Nutrition - Alb 1.9; renal diet; nepro bid, prostat -  7. Sacral decub - Hydrotherapy and local care. Per surgery, left side of decub may require re-debridement if wound does not improve in next few days ; nutrition, antibioitcs  8. Hx c diff + 5/20 - on po flagyl  9. Dispo/ EOL issues - Palliative on board. Per family conf - plan is to continue HD and medical care; prognosis for recovery very poor; prognosis for prolonged suffering is great; he would need to be able to dialyze in a recliner for 4 hrs plus transport time in order to be discharged. Would be ok for discharge from renal standpoint if no further debridements needed and sufficient wound care  could be performed at SNF. It appears he can tolerate dialysis in a recliner.  Sheffield Slider, PA-C [name] Beeper [phone] 02/07/2013,8:51 AM  LOS: 9 days   Patient seen and examined.  Agree with assessment and plan as above. Patrick Moselle  MD [phone] pgr    [phone] cell 02/07/2013, 1:21 PM   Additional Objective Labs: Basic Metabolic Panel:  Recent Labs Lab 02/02/13 0704  02/04/13 0752 02/06/13 0605 02/07/13 0550  NA 133*  < > 132* 134* 132*  K 4.3  < > 3.7 4.3 4.3  CL 94*  < > 95* 96 95*  CO2 25  < > 25 28 28   GLUCOSE 88  < > 111* 94 98  BUN 44*  < > 44* 47* 71*  CREATININE 5.56*  < > 5.30* 5.35* 6.33*  CALCIUM 9.1  < > 9.2 8.8 8.7  PHOS 5.0*  --  4.7* 4.6  --   < > = values in this interval not displayed. Liver Function Tests:  Recent Labs Lab 02/02/13 0704 02/04/13 0752 02/06/13 0605  ALBUMIN 1.8* 2.0* 1.9*   CBC:  Recent Labs Lab 02/02/13 0704 02/03/13 0525 02/04/13 0752 02/06/13 0605 02/07/13 0550  WBC 18.0* 16.0* 15.1* 13.4* 13.2*  HGB 8.6*  8.6* 9.2* 8.9* 9.0*  HCT 25.9* 27.4* 27.9* 27.9* 28.3*  MCV 81.7 83.8 82.5 84.0 82.7  PLT 489* 493* 509* 473* 464*   BCBG:  Recent Labs Lab 02/02/13 1228  GLUCAP 88  Medications:   .  ceFAZolin (ANCEF) IV  2 g Intravenous Q M,W,F-HD  . cinacalcet  30 mg Oral BID WC  . collagenase   Topical Daily  . darbepoetin (ARANESP) injection - DIALYSIS  60 mcg Intravenous Q Mon-HD  . feeding supplement (NEPRO CARB STEADY)  237 mL Oral BID BM  . feeding supplement  30 mL Oral TID WC  . gabapentin  100 mg Oral Daily  . lanthanum  500 mg Oral TID WC  . metroNIDAZOLE  500 mg Oral Q8H  . midodrine  10 mg Oral TID WC  . multivitamin  1 tablet Oral Daily  . omega-3 acid ethyl esters  1 g Oral Daily  . sodium chloride  3 mL Intravenous Q12H  . vancomycin  1,000 mg Intravenous Q M,W,F-HD

## 2013-02-07 NOTE — Progress Notes (Signed)
Subjective: Pt in hemodialysis, states he is feeling good.  Denies fever, chills.  Denies diarrhea or incontinence.  States his appetite is fair.    Objective: Vital signs in last 24 hours: Temp:  [96.8 F (36 C)-99 F (37.2 C)] 96.8 F (36 C) (06/30 0742) Pulse Rate:  [65-96] 65 (06/30 0830) Resp:  [16-20] 16 (06/30 0830) BP: (85-141)/(48-68) 100/57 mmHg (06/30 0830) SpO2:  [97 %-99 %] 97 % (06/30 0742) Weight:  [177 lb 14.4 oz (80.695 kg)] 177 lb 14.4 oz (80.695 kg) (06/29 2249) Last BM Date: 02/06/13  Intake/Output from previous day: 06/29 0701 - 06/30 0700 In: 840 [P.O.:840] Out: 600 [Urine:600] Intake/Output this shift:    General appearance: alert, cooperative, appears stated age and no distress Skin: Skin color, texture, turgor normal. No rashes or lesions or decubitus ulcer wound was not exammined  Lab Results:   Recent Labs  02/06/13 0605 02/07/13 0550  WBC 13.4* 13.2*  HGB 8.9* 9.0*  HCT 27.9* 28.3*  PLT 473* 464*   BMET  Recent Labs  02/06/13 0605 02/07/13 0550  NA 134* 132*  K 4.3 4.3  CL 96 95*  CO2 28 28  GLUCOSE 94 98  BUN 47* 71*  CREATININE 5.35* 6.33*  CALCIUM 8.8 8.7   PT/INR No results found for this basename: LABPROT, INR,  in the last 72 hours ABG No results found for this basename: PHART, PCO2, PO2, HCO3,  in the last 72 hours  Studies/Results: No results found.  Anti-infectives: Anti-infectives   Start     Dose/Rate Route Frequency Ordered Stop   02/07/13 1200  vancomycin (VANCOCIN) IVPB 1000 mg/200 mL premix     1,000 mg 200 mL/hr over 60 Minutes Intravenous Every M-W-F (Hemodialysis) 02/04/13 1616     02/04/13 1800  ceFAZolin (ANCEF) IVPB 2 g/50 mL premix     2 g 100 mL/hr over 30 Minutes Intravenous Every M-W-F (Hemodialysis) 02/02/13 1520 03/18/13 1159   02/03/13 1400  metroNIDAZOLE (FLAGYL) tablet 500 mg     500 mg Oral 3 times per day 02/03/13 1105     02/02/13 1530  metroNIDAZOLE (FLAGYL) tablet 500 mg  Status:   Discontinued     500 mg Oral 3 times per day 02/02/13 1520 02/02/13 1520   01/31/13 2200  vancomycin (VANCOCIN) 50 mg/mL oral solution 125 mg  Status:  Discontinued     125 mg Oral Daily 01/31/13 2116 02/03/13 1105   01/31/13 2000  ceFEPIme (MAXIPIME) 2 g in dextrose 5 % 50 mL IVPB  Status:  Discontinued     2 g 100 mL/hr over 30 Minutes Intravenous Every M-W-F (2000) 01/29/13 0546 02/02/13 1032   01/31/13 1200  vancomycin (VANCOCIN) IVPB 750 mg/150 ml premix  Status:  Discontinued     750 mg 150 mL/hr over 60 Minutes Intravenous Every M-W-F (Hemodialysis) 01/29/13 0546 02/04/13 1616   01/29/13 0600  vancomycin (VANCOCIN) IVPB 750 mg/150 ml premix     750 mg 150 mL/hr over 60 Minutes Intravenous  Once 01/29/13 0545 01/29/13 0958   01/29/13 0600  ceFEPIme (MAXIPIME) 2 g in dextrose 5 % 50 mL IVPB     2 g 100 mL/hr over 30 Minutes Intravenous  Once 01/29/13 0546 01/29/13 0909   01/29/13 0045  vancomycin (VANCOCIN) IVPB 1000 mg/200 mL premix     1,000 mg 200 mL/hr over 60 Minutes Intravenous  Once 01/29/13 0033 01/29/13 0226   01/29/13 0045  piperacillin-tazobactam (ZOSYN) IVPB 2.25 g     2.25  g 100 mL/hr over 30 Minutes Intravenous  Once 01/29/13 0033 01/29/13 0333   01/29/13 0045  gentamicin (GARAMYCIN) IVPB 100 mg     100 mg 200 mL/hr over 30 Minutes Intravenous  Once 01/29/13 0043 01/29/13 0442      Assessment/Plan: Large sacral decub s/p bedside debridement  -s/p bedside debridement(02/03/13) -Cont wound care, santyl, and hydrotherapy.  Due for hydrotherapy today, please page me at [phone] before starting the therapy. -maximize nutrition, mobility. -SCDs -will assess wound today with physical therapy to determine whether further debridement is necessary.   LOS: 9 days    Vicki Chaffin  ANP-BC   02/07/2013 9:03 AM

## 2013-02-07 NOTE — Progress Notes (Signed)
NUTRITION FOLLOW UP  DOCUMENTATION CODES  Per approved criteria   -Severe malnutrition in the context of chronic illness    Intervention:   Continue Nepro Shake PO BID, 30 ml Prostat po TID, and Rena-Vite. Consider diet liberalization to provide additional PO choices. If aggressive nutrition interventions warranted - please consult RD. RD to continue to follow nutrition care plan.  Nutrition Dx:   Increased nutrient needs related to HD, wound healing as evidenced by estimated nutrition needs.  Goal:   Oral intake with meals & supplements to meet >/= 90% of estimated nutrition needs.  Monitor:   PO & supplemental intake, weight, labs, I/O's  Assessment:   Patient with ESRD on hemodialysis who was recently admitted for sepsis secondary to C. difficile colitis was brought to the ER from nursing home after patient was found to be hypotensive. Sepsis likely also 2/2 sacral decubitus.  Meal intake has improved, oral intake is typically >75% of meals. Family wishes to pursue ongoing aggressive medical treatment, however per GOC note - family does not desire artificial means of nutrition support.  Noted to be pocketing pills on 6/25. BSE completed 6/28 with recommendations for Regular diet , chopped meats, and thin liquids. Currently ordered for Renal diet restrictions.  Surgery consulted re: patient's large sacral wound, currently measuring 10cm X 10cm X 1.25cm. Per surgery, plans if for bedside surgical debridement. Family understands that ultimate healing of this wound is unlikely. Now ordered for hydrotherapy.  Per most recent labs, potassium and phos WNL. Albumin continues to decline, most recent albumin 1.9.  Patient meets criteria for severe malnutrition in the context of chronic illness as evidenced by 17% wt loss in < 6 months and intake of < 75% x at least 1 month.   Height: Ht Readings from Last 1 Encounters:  01/29/13 6\' 6"  (1.981 m)    Weight Status:   Wt Readings from  Last 1 Encounters:  02/06/13 177 lb 14.4 oz (80.695 kg)  EDW per renal is 77.5 kg   Re-estimated needs:  Kcal: 2100 - 2300 Protein: 120 - 135 g Fluid: 1.2 liters  Skin:  Stage IV sacral pressure ulcer  unstageable R heel DTI to R and L ischium Stage II to L foot DTI to L heel ---> receiving bedside debridements and hydrotherapy  Diet Order: Renal 60-70; 1200 ml fluid restriction   Intake/Output Summary (Last 24 hours) at 02/07/13 1109 Last data filed at 02/07/13 0553  Gross per 24 hour  Intake    360 ml  Output    600 ml  Net   -240 ml    Last BM: 6/29   Labs:   Recent Labs Lab 02/02/13 0704  02/04/13 0752 02/06/13 0605 02/07/13 0550  NA 133*  < > 132* 134* 132*  K 4.3  < > 3.7 4.3 4.3  CL 94*  < > 95* 96 95*  CO2 25  < > 25 28 28   BUN 44*  < > 44* 47* 71*  CREATININE 5.56*  < > 5.30* 5.35* 6.33*  CALCIUM 9.1  < > 9.2 8.8 8.7  PHOS 5.0*  --  4.7* 4.6  --   GLUCOSE 88  < > 111* 94 98  < > = values in this interval not displayed.  CBG (last 3)  No results found for this basename: GLUCAP,  in the last 72 hours  Scheduled Meds: .  ceFAZolin (ANCEF) IV  2 g Intravenous Q M,W,F-HD  . cinacalcet  30 mg Oral  BID WC  . collagenase   Topical Daily  . [START ON 02/09/2013] darbepoetin (ARANESP) injection - DIALYSIS  100 mcg Intravenous Q Wed-HD  . feeding supplement (NEPRO CARB STEADY)  237 mL Oral BID BM  . feeding supplement  30 mL Oral TID WC  . gabapentin  100 mg Oral Daily  . lanthanum  500 mg Oral TID WC  . metroNIDAZOLE  500 mg Oral Q8H  . midodrine  10 mg Oral TID WC  . multivitamin  1 tablet Oral Daily  . omega-3 acid ethyl esters  1 g Oral Daily  . sodium chloride  3 mL Intravenous Q12H  . vancomycin  1,000 mg Intravenous Q M,W,F-HD    Continuous Infusions:   Jarold Motto MS, RD, LDN Pager: [phone] After-hours pager: [phone]

## 2013-02-07 NOTE — Progress Notes (Signed)
ANTIBIOTIC CONSULT NOTE - FOLLOW UP  Pharmacy Consult for vancomycin Indication: MRSA bacteremia, worsening sacral decub ulcers/presumed osteomyelitis   No Known Allergies  Patient Measurements: Height: 6\' 6"  (198.1 cm) Weight: 177 lb 14.4 oz (80.695 kg) IBW/kg (Calculated) : 91.4   Vital Signs: Temp: 96.8 F (36 C) (06/30 0742) Temp src: Oral (06/30 0742) BP: 118/54 mmHg (06/30 1103) Pulse Rate: 73 (06/30 1103) Intake/Output from previous day: 06/29 0701 - 06/30 0700 In: 840 [P.O.:840] Out: 600 [Urine:600] Intake/Output from this shift:    Labs:  Recent Labs  02/06/13 0605 02/07/13 0550  WBC 13.4* 13.2*  HGB 8.9* 9.0*  PLT 473* 464*  CREATININE 5.35* 6.33*   Estimated Creatinine Clearance: 10.6 ml/min (by C-G formula based on Cr of 6.33). No results found for this basename: VANCOTROUGH, VANCOPEAK, VANCORANDOM, GENTTROUGH, GENTPEAK, GENTRANDOM, TOBRATROUGH, TOBRAPEAK, TOBRARND, AMIKACINPEAK, AMIKACINTROU, AMIKACIN,  in the last 72 hours   Microbiology: Recent Results (from the past 720 hour(s))  CULTURE, BLOOD (ROUTINE X 2)     Status: None   Collection Time    01/29/13 12:30 AM      Result Value Range Status   Specimen Description BLOOD RIGHT ARM   Final   Special Requests BOTTLES DRAWN AEROBIC AND ANAEROBIC 10CC EACH   Final   Culture  Setup Time 01/29/2013 15:10   Final   Culture     Final   Value: METHICILLIN RESISTANT STAPHYLOCOCCUS AUREUS     Note: RIFAMPIN AND GENTAMICIN SHOULD NOT BE USED AS SINGLE DRUGS FOR TREATMENT OF STAPH INFECTIONS. CRITICAL RESULT CALLED TO, READ BACK BY AND VERIFIED WITH: ANNA AGUILAR 01/31/13 1440 BY SMITHERSJ     Note: Gram Stain Report Called to,Read Back By and Verified With: BERNADETTE LOMCALLO 01/30/13 @ 1:14PM BY RUSCA.   Report Status 02/01/2013 FINAL   Final   Organism ID, Bacteria METHICILLIN RESISTANT STAPHYLOCOCCUS AUREUS   Final  CULTURE, BLOOD (ROUTINE X 2)     Status: None   Collection Time    01/29/13 12:45 AM     Result Value Range Status   Specimen Description BLOOD LEFT HAND   Final   Special Requests BOTTLES DRAWN AEROBIC ONLY 10CC   Final   Culture  Setup Time 01/29/2013 15:11   Final   Culture     Final   Value: STAPHYLOCOCCUS AUREUS     Note: SUSCEPTIBILITIES PERFORMED ON PREVIOUS CULTURE WITHIN THE LAST 5 DAYS.     Note: Gram Stain Report Called to,Read Back By and Verified With: BERNADETTE [facility] 01/30/13 @ 1:14PM BY RUSCA.   Report Status 02/01/2013 FINAL   Final  URINE CULTURE     Status: None   Collection Time    01/29/13  1:56 AM      Result Value Range Status   Specimen Description URINE, RANDOM   Final   Special Requests NONE   Final   Culture  Setup Time 01/29/2013 15:24   Final   Colony Count NO GROWTH   Final   Culture NO GROWTH   Final   Report Status 01/30/2013 FINAL   Final  MRSA PCR SCREENING     Status: None   Collection Time    01/29/13  5:30 AM      Result Value Range Status   MRSA by PCR NEGATIVE  NEGATIVE Final   Comment:            The GeneXpert MRSA Assay (FDA     approved for NASAL specimens  only), is one component of a     comprehensive MRSA colonization     surveillance program. It is not     intended to diagnose MRSA     infection nor to guide or     monitor treatment for     MRSA infections.  CULTURE, BLOOD (ROUTINE X 2)     Status: None   Collection Time    02/02/13  3:30 PM      Result Value Range Status   Specimen Description BLOOD RIGHT ARM   Final   Special Requests BOTTLES DRAWN AEROBIC AND ANAEROBIC 10CC   Final   Culture  Setup Time 02/03/2013 01:15   Final   Culture     Final   Value:        BLOOD CULTURE RECEIVED NO GROWTH TO DATE CULTURE WILL BE HELD FOR 5 DAYS BEFORE ISSUING A FINAL NEGATIVE REPORT   Report Status PENDING   Incomplete  CULTURE, BLOOD (ROUTINE X 2)     Status: None   Collection Time    02/02/13  3:40 PM      Result Value Range Status   Specimen Description BLOOD RIGHT HAND   Final   Special Requests BOTTLES  DRAWN AEROBIC ONLY 10CC   Final   Culture  Setup Time 02/03/2013 01:15   Final   Culture     Final   Value:        BLOOD CULTURE RECEIVED NO GROWTH TO DATE CULTURE WILL BE HELD FOR 5 DAYS BEFORE ISSUING A FINAL NEGATIVE REPORT   Report Status PENDING   Incomplete    Anti-infectives   Start     Dose/Rate Route Frequency Ordered Stop   02/07/13 1200  vancomycin (VANCOCIN) IVPB 1000 mg/200 mL premix     1,000 mg 200 mL/hr over 60 Minutes Intravenous Every M-W-F (Hemodialysis) 02/04/13 1616     02/04/13 1800  ceFAZolin (ANCEF) IVPB 2 g/50 mL premix     2 g 100 mL/hr over 30 Minutes Intravenous Every M-W-F (Hemodialysis) 02/02/13 1520 03/18/13 1159   02/03/13 1400  metroNIDAZOLE (FLAGYL) tablet 500 mg     500 mg Oral 3 times per day 02/03/13 1105     02/02/13 1530  metroNIDAZOLE (FLAGYL) tablet 500 mg  Status:  Discontinued     500 mg Oral 3 times per day 02/02/13 1520 02/02/13 1520   01/31/13 2200  vancomycin (VANCOCIN) 50 mg/mL oral solution 125 mg  Status:  Discontinued     125 mg Oral Daily 01/31/13 2116 02/03/13 1105   01/31/13 2000  ceFEPIme (MAXIPIME) 2 g in dextrose 5 % 50 mL IVPB  Status:  Discontinued     2 g 100 mL/hr over 30 Minutes Intravenous Every M-W-F (2000) 01/29/13 0546 02/02/13 1032   01/31/13 1200  vancomycin (VANCOCIN) IVPB 750 mg/150 ml premix  Status:  Discontinued     750 mg 150 mL/hr over 60 Minutes Intravenous Every M-W-F (Hemodialysis) 01/29/13 0546 02/04/13 1616   01/29/13 0600  vancomycin (VANCOCIN) IVPB 750 mg/150 ml premix     750 mg 150 mL/hr over 60 Minutes Intravenous  Once 01/29/13 0545 01/29/13 0958   01/29/13 0600  ceFEPIme (MAXIPIME) 2 g in dextrose 5 % 50 mL IVPB     2 g 100 mL/hr over 30 Minutes Intravenous  Once 01/29/13 0546 01/29/13 0909   01/29/13 0045  vancomycin (VANCOCIN) IVPB 1000 mg/200 mL premix     1,000 mg 200 mL/hr over 60 Minutes Intravenous  Once 01/29/13 0033 01/29/13 0226   01/29/13 0045  piperacillin-tazobactam (ZOSYN) IVPB  2.25 g     2.25 g 100 mL/hr over 30 Minutes Intravenous  Once 01/29/13 0033 01/29/13 0333   01/29/13 0045  gentamicin (GARAMYCIN) IVPB 100 mg     100 mg 200 mL/hr over 30 Minutes Intravenous  Once 01/29/13 0043 01/29/13 0442      Assessment: Patient is an 79 y.o M on vancomycin and ancef for MRSA bacteremia and infected decub/ostreo with plan to treat with abx for 6 weeks (thru August 2nd).  Last vancomycin trough level drawn was slightly below goal at 13.2 with dose increased to 1gm qHD.  Patient's currently in HD.   Goal of Therapy:  Pre-HD vancomycin level = 15-25  Plan:  1) Continue current vancomycin regimen 2) will re-check vancomycin level later this week  Sangeeta Youse P 02/07/2013,11:08 AM

## 2013-02-07 NOTE — Progress Notes (Signed)
Physical Therapy Wound Treatment Patient Details  Name: Keyandre Pileggi MRN: [id number] Date of Birth: [birth year]  Today's Date: 02/07/2013 Time: 4098-1191 Time Calculation (min): 43 min  Subjective  Patient and Family Stated Goals: not stated  Pain Score:    Wound Assessment  Pressure Ulcer 12/27/12 Unstageable - Full thickness tissue loss in which the base of the ulcer is covered by slough (yellow, tan, gray, green or brown) and/or eschar (tan, brown or black) in the wound bed. (Active)  State of Healing Non-healing 02/07/2013  4:24 PM  Site / Wound Assessment Yellow;Pink;Red 02/07/2013  4:24 PM  % Wound base Red or Granulating 35% 02/07/2013  4:24 PM  % Wound base Yellow 65% 02/07/2013  4:24 PM  % Wound base Black 0% 02/07/2013  4:24 PM  % Wound base Other (Comment) 0% 02/07/2013  4:24 PM  Peri-wound Assessment Purple 02/04/2013  2:24 PM  Margins Epibole (rolled edges) 02/07/2013  4:24 PM  Drainage Amount Minimal 02/07/2013  4:24 PM  Drainage Description Serosanguineous;Odor;Green 02/07/2013  4:24 PM  Treatment Debridement (Selective);Hydrotherapy (Ultrasonic mist);Packing (Saline gauze) 02/05/2013  9:57 AM  Dressing Type Gauze (Comment);Barrier Film (skin prep);Foam 02/07/2013  4:24 PM  Dressing Clean;Dry;Intact 02/07/2013  4:24 PM      Hydrotherapy Ultrasonic mist  - wound location: sacrum Ultrasonic mist at 35KHz (+/-3KHz) at ___ percent: 100 % Ultrasonic mist therapy minutes: 9 min Selective Debridement Selective Debridement - Location: sacrum Selective Debridement - Tools Used: Forceps;Scissors Selective Debridement - Tissue Removed: yellow slough   Wound Assessment and Plan  Wound Therapy - Assess/Plan/Recommendations Wound Therapy - Clinical Statement: Pt with large sacral wound that can benefit from hydrotherapy to decr necrotic tissue and promote healing. Hydrotherapy Plan: Debridement;Dressing change;Patient/family education;Ultrasonic wound therapy @35  KHz (+/- 3 KHz) Wound  Therapy - Frequency: 6X / week Wound Therapy - Follow Up Recommendations: Skilled nursing facility Wound Plan: decr. bacterial load and selectively debide to healthy tissues  Wound Therapy Goals- Improve the function of patient's integumentary system by progressing the wound(s) through the phases of wound healing (inflammation - proliferation - remodeling) by: Decrease Necrotic Tissue to: 30 Decrease Necrotic Tissue - Progress: Progressing toward goal Increase Granulation Tissue to: 70 Increase Granulation Tissue - Progress: Progressing toward goal Improve Drainage Characteristics: Serous  Goals will be updated until maximal potential achieved or discharge criteria met.  Discharge criteria: when goals achieved, discharge from hospital, MD decision/surgical intervention, no progress towards goals, refusal/missing three consecutive treatments without notification or medical reason.  GP     Shanice Poznanski 02/07/2013, 4:27 PM  [facility] PT [phone]

## 2013-02-07 NOTE — Progress Notes (Signed)
Dressing change today.  [name]. Patrick Skains, MD, [facility] Surgery  General/ Trauma Surgery  02/07/2013 11:39 AM

## 2013-02-07 NOTE — Progress Notes (Signed)
Patient ID: [name]  male  MVH:[id number]    DOB: [birth year]    DOA: 01/29/2013  PCP: Crawford Givens, MD  Assessment/Plan: Principal Problem: MRSA Sepsis / leukocytosis  - suspected source of entry is sacral decubitus  - hypotension improved, fevers resolved  - blood cx both + for MRSA (2/2) - repeat cultures 6/25  recs per ID:  TTE to evaluate for vegetations shows none- no need for TEE as patient is going to be treated  - presumed osteomyelitis: he will need to vancomycin x 6 wks in addn to gram negative (ancef) and flagyl  - c.difficile infection- resolved- giving flagyl  - The patient is receiving antibiotics with hemodialysis, MWF and Flagyl.  Decubitus ulcer of sacral region, stage 4  - wound care consult performed - wounds are felt to be beyond conservative tx at this time - Palliative Care consult requested to establish GOC with family - plan to continue HTN medical care, general surgery consulted and bedside debridement done with good results- recommending hydrotherapy-  - appreciate surgery input, hydrotherapy today and ? possible debridement  End stage renal disease  - cont dialysis per Nephrology for now   Anemia of chronic disease  - stable   Protein-calorie malnutrition, severe  - nutritional supplements  DM  CBG reasonably controlled at present time  Hx of HTN  Hx of CVA with suspected vascular dementia  Decubitus ulcer of R heel  Severe protein calorie malnutrition  DVT Prophylaxis:  Code Status:  Disposition:Will follow surgery plans, hemodialysis today    Subjective: Patient seen during the hemodialysis on chair, no specific complaints.  Objective: Weight change:   Intake/Output Summary (Last 24 hours) at 02/07/13 1103 Last data filed at 02/07/13 0553  Gross per 24 hour  Intake    360 ml  Output    600 ml  Net   -240 ml   Blood pressure 113/59, pulse 73, temperature 96.8 F (36 C), temperature source Oral, resp. rate 18, height 6\' 6"  (1.981  m), weight 80.695 kg (177 lb 14.4 oz), SpO2 97.00%.  Physical Exam: General: Alert and awake, oriented x3, not in any acute distress. CVS: S1-S2 clear, no murmur rubs or gallops Chest: clear to auscultation bilaterally, no wheezing, rales or rhonchi Abdomen: soft nontender, nondistended, normal bowel sounds  Extremities: soft boots on the feet no significant edema   Lab Results: Basic Metabolic Panel:  Recent Labs Lab 02/06/13 0605 02/07/13 0550  NA 134* 132*  K 4.3 4.3  CL 96 95*  CO2 28 28  GLUCOSE 94 98  BUN 47* 71*  CREATININE 5.35* 6.33*  CALCIUM 8.8 8.7  PHOS 4.6  --    Liver Function Tests:  Recent Labs Lab 02/04/13 0752 02/06/13 0605  ALBUMIN 2.0* 1.9*   No results found for this basename: LIPASE, AMYLASE,  in the last 168 hours No results found for this basename: AMMONIA,  in the last 168 hours CBC:  Recent Labs Lab 02/06/13 0605 02/07/13 0550  WBC 13.4* 13.2*  HGB 8.9* 9.0*  HCT 27.9* 28.3*  MCV 84.0 82.7  PLT 473* 464*   Cardiac Enzymes: No results found for this basename: CKTOTAL, CKMB, CKMBINDEX, TROPONINI,  in the last 168 hours BNP: No components found with this basename: POCBNP,  CBG:  Recent Labs Lab 02/02/13 1228  GLUCAP 88     Micro Results: Recent Results (from the past 240 hour(s))  CULTURE, BLOOD (ROUTINE X 2)     Status: None   Collection Time  01/29/13 12:30 AM      Result Value Range Status   Specimen Description BLOOD RIGHT ARM   Final   Special Requests BOTTLES DRAWN AEROBIC AND ANAEROBIC 10CC EACH   Final   Culture  Setup Time 01/29/2013 15:10   Final   Culture     Final   Value: METHICILLIN RESISTANT STAPHYLOCOCCUS AUREUS     Note: RIFAMPIN AND GENTAMICIN SHOULD NOT BE USED AS SINGLE DRUGS FOR TREATMENT OF STAPH INFECTIONS. CRITICAL RESULT CALLED TO, READ BACK BY AND VERIFIED WITH: ANNA AGUILAR 01/31/13 1440 BY SMITHERSJ     Note: Gram Stain Report Called to,Read Back By and Verified With: BERNADETTE LOMCALLO  01/30/13 @ 1:14PM BY RUSCA.   Report Status 02/01/2013 FINAL   Final   Organism ID, Bacteria METHICILLIN RESISTANT STAPHYLOCOCCUS AUREUS   Final  CULTURE, BLOOD (ROUTINE X 2)     Status: None   Collection Time    01/29/13 12:45 AM      Result Value Range Status   Specimen Description BLOOD LEFT HAND   Final   Special Requests BOTTLES DRAWN AEROBIC ONLY 10CC   Final   Culture  Setup Time 01/29/2013 15:11   Final   Culture     Final   Value: STAPHYLOCOCCUS AUREUS     Note: SUSCEPTIBILITIES PERFORMED ON PREVIOUS CULTURE WITHIN THE LAST 5 DAYS.     Note: Gram Stain Report Called to,Read Back By and Verified With: BERNADETTE [facility] 01/30/13 @ 1:14PM BY RUSCA.   Report Status 02/01/2013 FINAL   Final  URINE CULTURE     Status: None   Collection Time    01/29/13  1:56 AM      Result Value Range Status   Specimen Description URINE, RANDOM   Final   Special Requests NONE   Final   Culture  Setup Time 01/29/2013 15:24   Final   Colony Count NO GROWTH   Final   Culture NO GROWTH   Final   Report Status 01/30/2013 FINAL   Final  MRSA PCR SCREENING     Status: None   Collection Time    01/29/13  5:30 AM      Result Value Range Status   MRSA by PCR NEGATIVE  NEGATIVE Final   Comment:            The GeneXpert MRSA Assay (FDA     approved for NASAL specimens     only), is one component of a     comprehensive MRSA colonization     surveillance program. It is not     intended to diagnose MRSA     infection nor to guide or     monitor treatment for     MRSA infections.  CULTURE, BLOOD (ROUTINE X 2)     Status: None   Collection Time    02/02/13  3:30 PM      Result Value Range Status   Specimen Description BLOOD RIGHT ARM   Final   Special Requests BOTTLES DRAWN AEROBIC AND ANAEROBIC 10CC   Final   Culture  Setup Time 02/03/2013 01:15   Final   Culture     Final   Value:        BLOOD CULTURE RECEIVED NO GROWTH TO DATE CULTURE WILL BE HELD FOR 5 DAYS BEFORE ISSUING A FINAL NEGATIVE REPORT    Report Status PENDING   Incomplete  CULTURE, BLOOD (ROUTINE X 2)     Status: None   Collection Time  02/02/13  3:40 PM      Result Value Range Status   Specimen Description BLOOD RIGHT HAND   Final   Special Requests BOTTLES DRAWN AEROBIC ONLY 10CC   Final   Culture  Setup Time 02/03/2013 01:15   Final   Culture     Final   Value:        BLOOD CULTURE RECEIVED NO GROWTH TO DATE CULTURE WILL BE HELD FOR 5 DAYS BEFORE ISSUING A FINAL NEGATIVE REPORT   Report Status PENDING   Incomplete    Studies/Results: Dg Chest Port 1 View  01/29/2013   *RADIOLOGY REPORT*  Clinical Data: Sepsis, hypotension, unresponsive, history hypertension, diabetes, end-stage renal disease on dialysis  PORTABLE CHEST - 1 VIEW  Comparison: Portable exam 0055 hours compared to 12/27/2012  Findings: Upper normal heart size. Atherosclerotic calcification aorta. Stable mediastinal contours and pulmonary vascularity. Bronchitic changes with minimal right basilar atelectasis. No definite acute infiltrate, pleural effusion or pneumothorax. Bilateral chronic rotator cuff tears and AC joint degenerative changes. Scattered endplate spur formation thoracic spine.  IMPRESSION: Bronchitic changes with minimal right basilar atelectasis.   Original Report Authenticated By: Ulyses Southward, M.D.    Medications: Scheduled Meds: .  ceFAZolin (ANCEF) IV  2 g Intravenous Q M,W,F-HD  . cinacalcet  30 mg Oral BID WC  . collagenase   Topical Daily  . [START ON 02/09/2013] darbepoetin (ARANESP) injection - DIALYSIS  100 mcg Intravenous Q Wed-HD  . feeding supplement (NEPRO CARB STEADY)  237 mL Oral BID BM  . feeding supplement  30 mL Oral TID WC  . gabapentin  100 mg Oral Daily  . lanthanum  500 mg Oral TID WC  . metroNIDAZOLE  500 mg Oral Q8H  . midodrine  10 mg Oral TID WC  . multivitamin  1 tablet Oral Daily  . omega-3 acid ethyl esters  1 g Oral Daily  . sodium chloride  3 mL Intravenous Q12H  . vancomycin  1,000 mg Intravenous Q  M,W,F-HD      LOS: 9 days   Geanine Vandekamp M.D. [facility] 02/07/2013, 11:03 AM Pager: [phone]  If 7PM-7AM, please contact night-coverage [email or web address] Password [email or web address]

## 2013-02-07 NOTE — Consult Note (Signed)
WOC follow-up: Pt now followed by CCS service for assessment and plan of care. Please re-consult if further assistance is needed.  Thank-you,  Cammie Mcgee MSN, RN, CWOCN, [place], CNS [phone]

## 2013-02-08 ENCOUNTER — Encounter (HOSPITAL_COMMUNITY): Payer: Self-pay | Admitting: Anesthesiology

## 2013-02-08 ENCOUNTER — Inpatient Hospital Stay (HOSPITAL_COMMUNITY): Payer: Medicare Other | Admitting: Anesthesiology

## 2013-02-08 ENCOUNTER — Encounter (HOSPITAL_COMMUNITY)
Admission: EM | Disposition: A | Discharge status: Skilled Nursing Facility | Payer: Self-pay | Source: Home / Self Care | Attending: Internal Medicine

## 2013-02-08 HISTORY — PX: WOUND DEBRIDEMENT: SHX247

## 2013-02-08 LAB — GLUCOSE, CAPILLARY: Glucose-Capillary: 90 mg/dL (ref 70–99)

## 2013-02-08 LAB — POCT I-STAT 4, (NA,K, GLUC, HGB,HCT)
Glucose, Bld: 87 mg/dL (ref 70–99)
HCT: 31 % — ABNORMAL LOW (ref 39.0–52.0)
Potassium: 3.5 mEq/L (ref 3.5–5.1)

## 2013-02-08 SURGERY — DEBRIDEMENT, WOUND
Anesthesia: General | Site: Buttocks | Wound class: Dirty or Infected

## 2013-02-08 MED ORDER — PROPOFOL 10 MG/ML IV BOLUS
INTRAVENOUS | Status: DC | PRN
  Administered 2013-02-08: 110 mg via INTRAVENOUS

## 2013-02-08 MED ORDER — OXYCODONE HCL 5 MG/5ML PO SOLN: 5.0000 mg | Freq: Once | ORAL | Status: DC | PRN

## 2013-02-08 MED ORDER — LIDOCAINE HCL (CARDIAC) 20 MG/ML IV SOLN
INTRAVENOUS | Status: DC | PRN
  Administered 2013-02-08: 30 mg via INTRAVENOUS

## 2013-02-08 MED ORDER — FENTANYL CITRATE 0.05 MG/ML IJ SOLN
25.0000 ug | INTRAMUSCULAR | Status: DC | PRN
  Administered 2013-02-08 (×2): 50 ug via INTRAVENOUS

## 2013-02-08 MED ORDER — PHENYLEPHRINE HCL 10 MG/ML IJ SOLN
INTRAMUSCULAR | Status: DC | PRN
  Administered 2013-02-08: 120 ug via INTRAVENOUS

## 2013-02-08 MED ORDER — ONDANSETRON HCL 4 MG/2ML IJ SOLN
INTRAMUSCULAR | Status: DC | PRN
  Administered 2013-02-08: 4 mg via INTRAVENOUS

## 2013-02-08 MED ORDER — NEOSTIGMINE METHYLSULFATE 1 MG/ML IJ SOLN
INTRAMUSCULAR | Status: DC | PRN
  Administered 2013-02-08: 2 mg via INTRAVENOUS

## 2013-02-08 MED ORDER — ESMOLOL HCL 10 MG/ML IV SOLN
INTRAVENOUS | Status: DC | PRN
  Administered 2013-02-08: 30 mg via INTRAVENOUS

## 2013-02-08 MED ORDER — ARTIFICIAL TEARS OP OINT
TOPICAL_OINTMENT | OPHTHALMIC | Status: DC | PRN
  Administered 2013-02-08: 1 via OPHTHALMIC

## 2013-02-08 MED ORDER — FENTANYL CITRATE 0.05 MG/ML IJ SOLN
INTRAMUSCULAR | Status: AC
  Filled 2013-02-08: qty 2

## 2013-02-08 MED ORDER — FENTANYL CITRATE 0.05 MG/ML IJ SOLN
INTRAMUSCULAR | Status: DC | PRN
  Administered 2013-02-08 (×3): 50 ug via INTRAVENOUS

## 2013-02-08 MED ORDER — PHENYLEPHRINE HCL 10 MG/ML IJ SOLN
10.0000 mg | INTRAVENOUS | Status: DC | PRN
  Administered 2013-02-08: 15 ug/min via INTRAVENOUS

## 2013-02-08 MED ORDER — SODIUM CHLORIDE 0.9 % IV SOLN
INTRAVENOUS | Status: DC
  Administered 2013-02-08 – 2013-02-10 (×5): via INTRAVENOUS

## 2013-02-08 MED ORDER — GLYCOPYRROLATE 0.2 MG/ML IJ SOLN
INTRAMUSCULAR | Status: DC | PRN
  Administered 2013-02-08: 0.3 mg via INTRAVENOUS

## 2013-02-08 MED ORDER — OXYCODONE HCL 5 MG PO TABS: 5.0000 mg | ORAL_TABLET | Freq: Once | ORAL | Status: DC | PRN

## 2013-02-08 MED ORDER — SODIUM CHLORIDE 0.9 % IV SOLN
INTRAVENOUS | Status: DC | PRN
  Administered 2013-02-08: 16:00:00 via INTRAVENOUS

## 2013-02-08 MED ORDER — LIDOCAINE HCL 4 % MT SOLN
OROMUCOSAL | Status: DC | PRN
  Administered 2013-02-08: 4 mL via TOPICAL

## 2013-02-08 MED ORDER — ROCURONIUM BROMIDE 100 MG/10ML IV SOLN
INTRAVENOUS | Status: DC | PRN
  Administered 2013-02-08: 30 mg via INTRAVENOUS

## 2013-02-08 SURGICAL SUPPLY — 38 items
BLADE SURG 10 STRL SS (BLADE) ×2 IMPLANT
BLADE SURG 15 STRL LF DISP TIS (BLADE) ×1 IMPLANT
BLADE SURG 15 STRL SS (BLADE) ×2
BLADE SURG ROTATE 9660 (MISCELLANEOUS) IMPLANT
CANISTER WOUND CARE 500ML ATS (WOUND CARE) ×1 IMPLANT
CLOTH BEACON ORANGE TIMEOUT ST (SAFETY) ×2 IMPLANT
COVER SURGICAL LIGHT HANDLE (MISCELLANEOUS) ×2 IMPLANT
DRAPE LAPAROTOMY TRNSV 102X78 (DRAPE) ×2 IMPLANT
DRAPE UTILITY 15X26 W/TAPE STR (DRAPE) ×4 IMPLANT
DRSG VAC ATS MED SENSATRAC (GAUZE/BANDAGES/DRESSINGS) ×1 IMPLANT
ELECT CAUTERY BLADE 6.4 (BLADE) ×2 IMPLANT
ELECT REM PT RETURN 9FT ADLT (ELECTROSURGICAL) ×2
ELECTRODE REM PT RTRN 9FT ADLT (ELECTROSURGICAL) ×1 IMPLANT
GLOVE BIO SURGEON STRL SZ7 (GLOVE) ×2 IMPLANT
GLOVE BIOGEL PI IND STRL 7.5 (GLOVE) ×1 IMPLANT
GLOVE BIOGEL PI INDICATOR 7.5 (GLOVE) ×1
GOWN STRL NON-REIN LRG LVL3 (GOWN DISPOSABLE) ×4 IMPLANT
HANDPIECE INTERPULSE COAX TIP (DISPOSABLE) ×2
KIT BASIN OR (CUSTOM PROCEDURE TRAY) ×2 IMPLANT
KIT ROOM TURNOVER OR (KITS) ×2 IMPLANT
NS IRRIG 1000ML POUR BTL (IV SOLUTION) ×2 IMPLANT
PACK SURGICAL SETUP 50X90 (CUSTOM PROCEDURE TRAY) ×2 IMPLANT
PAD ARMBOARD 7.5X6 YLW CONV (MISCELLANEOUS) ×4 IMPLANT
PENCIL BUTTON HOLSTER BLD 10FT (ELECTRODE) ×2 IMPLANT
SET HNDPC FAN SPRY TIP SCT (DISPOSABLE) IMPLANT
SPECIMEN JAR SMALL (MISCELLANEOUS) ×1 IMPLANT
SPONGE GAUZE 4X4 12PLY (GAUZE/BANDAGES/DRESSINGS) ×2 IMPLANT
SPONGE LAP 18X18 X RAY DECT (DISPOSABLE) ×2 IMPLANT
SUT MNCRL AB 4-0 PS2 18 (SUTURE) IMPLANT
SUT VIC AB 2-0 SH 27 (SUTURE)
SUT VIC AB 2-0 SH 27X BRD (SUTURE) IMPLANT
SUT VIC AB 3-0 SH 27 (SUTURE)
SUT VIC AB 3-0 SH 27XBRD (SUTURE) IMPLANT
TOWEL OR 17X24 6PK STRL BLUE (TOWEL DISPOSABLE) ×2 IMPLANT
TOWEL OR 17X26 10 PK STRL BLUE (TOWEL DISPOSABLE) ×2 IMPLANT
TUBE CONNECTING 12X1/4 (SUCTIONS) ×2 IMPLANT
WATER STERILE IRR 1000ML POUR (IV SOLUTION) IMPLANT
YANKAUER SUCT BULB TIP NO VENT (SUCTIONS) ×2 IMPLANT

## 2013-02-08 NOTE — Progress Notes (Signed)
PT Cancellation Note  Patient Details Name: [name] MRN: [id number] DOB: 01/17/1931   Cancelled Treatment:    Reason Eval/Treat Not Completed: Patient at procedure or test/unavailable, in OR. 02/08/2013  McGrew Bing, PT [phone]  (pager)   Aleksi Brummet, [name] 02/08/2013, 3:21 PM

## 2013-02-08 NOTE — Consult Note (Signed)
I have reviewed and discussed the care of this patient in detail with the nurse practitioner including pertinent patient records, physical exam findings and data. I agree with details of this encounter.  

## 2013-02-08 NOTE — Preoperative (Signed)
Beta Blockers   Reason not to administer Beta Blockers:Not Applicable 

## 2013-02-08 NOTE — Progress Notes (Signed)
Plan operative debridement in OR today.  [name]. Corliss Skains, MD, [facility] Surgery  General/ Trauma Surgery  02/08/2013 9:00 AM

## 2013-02-08 NOTE — Anesthesia Postprocedure Evaluation (Signed)
  Anesthesia Post-op Note  Patient: [name]  Procedure(s) Performed: Procedure(s): DEBRIDEMENT WOUND (N/A)  Patient Location: PACU  Anesthesia Type:General  Level of Consciousness: awake and alert   Airway and Oxygen Therapy: Patient Spontanous Breathing and Patient connected to nasal cannula oxygen  Post-op Pain: mild  Post-op Assessment: Post-op Vital signs reviewed, Patient's Cardiovascular Status Stable, Respiratory Function Stable, Patent Airway and No signs of Nausea or vomiting  Post-op Vital Signs: Reviewed and stable  Complications: No apparent anesthesia complications

## 2013-02-08 NOTE — Progress Notes (Signed)
SLP Cancellation Note  Patient Details Name: [name] MRN: [id number] DOB: [birth year]   Cancelled treatment:       Reason Eval/Treat Not Completed:  (pt npo except sips with medicine, ? for procedure today)   Donavan Burnet, MS [facility] SLP [phone]

## 2013-02-08 NOTE — Anesthesia Procedure Notes (Signed)
Procedure Name: Intubation Date/Time: 02/08/2013 4:48 PM Performed by: Gayla Medicus Pre-anesthesia Checklist: Timeout performed, Patient identified, Emergency Drugs available, Suction available and Patient being monitored Patient Re-evaluated:Patient Re-evaluated prior to inductionOxygen Delivery Method: Circle system utilized Preoxygenation: Pre-oxygenation with 100% oxygen Intubation Type: IV induction Ventilation: Mask ventilation without difficulty and Oral airway inserted - appropriate to patient size Laryngoscope Size: Mac and 4 Grade View: Grade I Tube type: Oral Tube size: 7.5 mm Number of attempts: 1 Airway Equipment and Method: Stylet and LTA kit utilized Placement Confirmation: ETT inserted through vocal cords under direct vision,  positive ETCO2 and breath sounds checked- equal and bilateral Secured at: 22 cm Tube secured with: Tape Dental Injury: Teeth and Oropharynx as per pre-operative assessment

## 2013-02-08 NOTE — Progress Notes (Signed)
[facility] Progress Note  Subjective:   Did I go to dialysis yesterday.  Objective Filed Vitals:   02/07/13 1301 02/07/13 1746 02/07/13 2101 02/08/13 0446  BP: 92/49 120/57 149/66 114/61  Pulse: 96 92 83 81  Temp: 97.6 F (36.4 C) 98.2 F (36.8 C) 98.8 F (37.1 C) 98.4 F (36.9 C)  TempSrc: Axillary Oral Oral Oral  Resp: 18 18 18 18   Height:      Weight:   80.695 kg (177 lb 14.4 oz)   SpO2: 100% 99% 100% 100%   Physical Exam General: Alert, stooling in bed Heart: RRR Lungs: no wheezes or rales Abdomen: soft, stool brown, semiformed Extremities: no overt edema; feet in soft boots Dialysis Access: left AVF + bruit  Outpatient HD: MWF [facility] 4hrs F180 500/A1.5 EDW 77.5kg 2K/2Ca Bath LUA AVF EPO 6200 Heparin 5000  Assessment/Plan:  1. MRSA bacteremia/ treating as presumed osteo per ID - on Vanc x 6 weeks+ ancef; Leukocytosis improving; TTE with EF 55-60%, no regurg or concerns for vegetation  2. ESRD - MWF -family wishes to continue - see #9; In a recliner to ensure he can tolerate outpt HD K 4.3 - next HD Wed 3. Anemia - Hgb improving, now 9 stable s/p transfusion. ^ Aranesp To 100 q Monday. Follow CBC 4. Secondary hyperparathyroidism - on sensipar and binders; P 4.6  5. Hypotension/volume - BP limiting volume removal - on midodrine , lUF 1.4 yesterday - no pre or post HD weight!! - weight lastpm 80.6 above prior EDW; minimal dependent edema on exam 6. Malnutrition - Alb 1.9; renal diet; nepro bid, prostat - npo today for surgery 7. Sacral decub - . For surgical wound debridement today; Has been receiving hydrotx and local care  He has foley cath to prevent wound contamination - 125 cc clear in bag - worry about risk for uti - had 600 urine output documented yesterday. 8. Hx c diff + 5/20 - on po flagyl  9. Dispo/ EOL issues - Palliative on board. Per family conf - plan is to continue HD and medical care; prognosis for recovery very poor; prognosis for prolonged  suffering is great; he would need to be able to dialyze in a recliner for 4 hrs plus transport time in order to be discharged. Would be ok for discharge from renal standpoint if no further debridements needed and sufficient wound care could be performed at SNF. It appears he can tolerate dialysis in a recliner.  Sheffield Slider, PA-C [facility] Beeper [phone] 02/08/2013,8:39 AM  LOS: 10 days   Patient seen and examined.  Agree with excellent assessment and plan as above. [name]  MD [employer] Pager [phone]    Cell  [phone] 02/08/2013, 12:07 PM    Additional Objective Labs: Basic Metabolic Panel:  Recent Labs Lab 02/02/13 0704  02/04/13 0752 02/06/13 0605 02/07/13 0550  NA 133*  < > 132* 134* 132*  K 4.3  < > 3.7 4.3 4.3  CL 94*  < > 95* 96 95*  CO2 25  < > 25 28 28   GLUCOSE 88  < > 111* 94 98  BUN 44*  < > 44* 47* 71*  CREATININE 5.56*  < > 5.30* 5.35* 6.33*  CALCIUM 9.1  < > 9.2 8.8 8.7  PHOS 5.0*  --  4.7* 4.6  --   < > = values in this interval not displayed. Liver Function Tests:  Recent Labs Lab 02/02/13 0704 02/04/13  8546 02/06/13 0605  ALBUMIN 1.8* 2.0* 1.9*   CBC:  Recent Labs Lab 02/02/13 0704 02/03/13 0525 02/04/13 0752 02/06/13 0605 02/07/13 0550  WBC 18.0* 16.0* 15.1* 13.4* 13.2*  HGB 8.6* 8.6* 9.2* 8.9* 9.0*  HCT 25.9* 27.4* 27.9* 27.9* 28.3*  MCV 81.7 83.8 82.5 84.0 82.7  PLT 489* 493* 509* 473* 464*   Blood Culture    Component Value Date/Time   SDES BLOOD RIGHT HAND 02/02/2013 1540   SPECREQUEST BOTTLES DRAWN AEROBIC ONLY 10CC 02/02/2013 1540   CULT        BLOOD CULTURE RECEIVED NO GROWTH TO DATE CULTURE WILL BE HELD FOR 5 DAYS BEFORE ISSUING A FINAL NEGATIVE REPORT 02/02/2013 1540   REPTSTATUS PENDING 02/02/2013 1540   Medications:   .  ceFAZolin (ANCEF) IV  2 g Intravenous Q M,W,F-HD  . cinacalcet  30 mg Oral BID WC  . collagenase   Topical Daily  . [START ON 02/09/2013] darbepoetin  (ARANESP) injection - DIALYSIS  100 mcg Intravenous Q Wed-HD  . feeding supplement (NEPRO CARB STEADY)  237 mL Oral BID BM  . feeding supplement  30 mL Oral TID WC  . gabapentin  100 mg Oral Daily  . lanthanum  500 mg Oral TID WC  . metroNIDAZOLE  500 mg Oral Q8H  . midodrine  10 mg Oral TID WC  . multivitamin  1 tablet Oral Daily  . omega-3 acid ethyl esters  1 g Oral Daily  . sodium chloride  3 mL Intravenous Q12H  . vancomycin  1,000 mg Intravenous Q M,W,F-HD

## 2013-02-08 NOTE — Op Note (Addendum)
Preop diagnosis: Sacral decubitus ulcer grade 4 with necrotic material Postop diagnosis: Same Procedure: Irrigation and debridement of sacral decubitus ulcer with placement of wound VAC Surgeon:Tevan Marian K. Anesthesia: Gen. Indications:  This is an 79 year old male who is dialysis dependent who was found to have a stage IV sacral decubitus ulcer. We have been attempting local wound care at the bedside. However there is some undermining and necrotic tissue. Therefore we made the decision to proceed to the operating room for debridement.  Description of procedure: The patient is brought to the operating room and placed in a supine position on the stretcher. After an adequate level of general anesthesia was obtained he was rotated to the prone position with appropriate padding. The dressing was removed. His buttocks were prepped with Betadine and draped in sterile fashion. A timeout was taken to ensure the proper patient proper procedure. The upper left side of the large open wound has some significant undermining. We removed some the skin and subcutaneous tissues to expose this area. There is some necrotic tissue underneath which is removed with cautery. We debrided some necrotic fibrinous tissue on the left side. The total wound measures 15 x 12 cm across and 2 cm deep. There is some granulation tissue on the right side. We irrigated thoroughly with the pulse lavage using 3 L of normal saline. We inspected for hemostasis. There is no exposed bone but the sacrum was easily palpable. We decided to place a wound VAC. A medium VAC sponge was cut to fit and sealed with the occlusive drape. There is a narrow bridge of skin measuring about 3 cm between the inferior portion of the wound and the patient's anus. The VAC was placed to suction with good seal. He was then rolled back to a supine position.  He was extubated and brought to recovery in stable condition. All sponge, instrument, and needle counts are  correct.  Wilmon Arms. Corliss Skains, MD, [facility]  General/ Trauma Surgery  02/08/2013 5:49 PM  At the request of the hospital coders, let me clarify.  Sharp excisional debridement with cautery of skin, subcutaneous fat, and muscle.  Wilmon Arms. Corliss Skains, MD, [facility]  General/ Trauma Surgery  02/17/2013 9:49 AM

## 2013-02-08 NOTE — Progress Notes (Signed)
Patient ID: [name]  male  NGE:[id number]    DOB: 12-21-1930    DOA: 01/29/2013  PCP: Crawford Givens, MD  Assessment/Plan: Principal Problem: MRSA Sepsis / leukocytosis  - suspected source of entry is sacral decubitus, hypotension improved, fevers resolved  - blood cx both + for MRSA (2/2) - repeat cultures 6/25  - recs per ID:  TTE to evaluate for vegetations shows none- no need for TEE as patient is going to be treated  - presumed osteomyelitis: he will need to vancomycin x 6 wks in addn to gram negative (ancef) and flagyl  - c.difficile infection- resolved- giving flagyl  - The patient is receiving antibiotics with hemodialysis, MWF and Flagyl.  Decubitus ulcer of sacral region, stage 4  - wound care consult performed - wounds are felt to be beyond conservative tx at this time - Palliative Care consult requested to establish GOC with family - plan to continue HTN medical care, general surgery consulted and bedside debridement done with good results- recommending hydrotherapy  - appreciate surgery input, debridement in OR today  End stage renal disease  - cont dialysis per Nephrology for now   Anemia of chronic disease  - stable   Protein-calorie malnutrition, severe  - nutritional supplements  DM  CBG reasonably controlled at present time  Hx of HTN  Hx of CVA with suspected vascular dementia  Decubitus ulcer of R heel  Severe protein calorie malnutrition  DVT Prophylaxis:  Code Status:  Disposition:Will follow surgery plans, OR today  Subjective: Patient seen earlier this morning, no specific complaints, OR today  Objective: Weight change: 0 kg (0 lb)  Intake/Output Summary (Last 24 hours) at 02/08/13 1049 Last data filed at 02/08/13 0845  Gross per 24 hour  Intake    240 ml  Output    986 ml  Net   -746 ml   Blood pressure 123/64, pulse 78, temperature 98.2 F (36.8 C), temperature source Oral, resp. rate 18, height 6\' 6"  (1.981 m), weight 80.695 kg (177  lb 14.4 oz), SpO2 100.00%.  Physical Exam: General: Alert and awake, oriented x3, not in any acute distress. CVS: S1-S2 clear, no murmur rubs or gallops Chest: clear to auscultation bilaterally, no wheezing, rales or rhonchi Abdomen: soft NT, ND, NBS  Extremities: soft boots on the feet no significant edema   Lab Results: Basic Metabolic Panel:  Recent Labs Lab 02/06/13 0605 02/07/13 0550  NA 134* 132*  K 4.3 4.3  CL 96 95*  CO2 28 28  GLUCOSE 94 98  BUN 47* 71*  CREATININE 5.35* 6.33*  CALCIUM 8.8 8.7  PHOS 4.6  --    Liver Function Tests:  Recent Labs Lab 02/04/13 0752 02/06/13 0605  ALBUMIN 2.0* 1.9*   No results found for this basename: LIPASE, AMYLASE,  in the last 168 hours No results found for this basename: AMMONIA,  in the last 168 hours CBC:  Recent Labs Lab 02/06/13 0605 02/07/13 0550  WBC 13.4* 13.2*  HGB 8.9* 9.0*  HCT 27.9* 28.3*  MCV 84.0 82.7  PLT 473* 464*   Cardiac Enzymes: No results found for this basename: CKTOTAL, CKMB, CKMBINDEX, TROPONINI,  in the last 168 hours BNP: No components found with this basename: POCBNP,  CBG:  Recent Labs Lab 02/02/13 1228  GLUCAP 88     Micro Results: Recent Results (from the past 240 hour(s))  CULTURE, BLOOD (ROUTINE X 2)     Status: None   Collection Time    02/02/13  3:30 PM      Result Value Range Status   Specimen Description BLOOD RIGHT ARM   Final   Special Requests BOTTLES DRAWN AEROBIC AND ANAEROBIC 10CC   Final   Culture  Setup Time 02/03/2013 01:15   Final   Culture     Final   Value:        BLOOD CULTURE RECEIVED NO GROWTH TO DATE CULTURE WILL BE HELD FOR 5 DAYS BEFORE ISSUING A FINAL NEGATIVE REPORT   Report Status PENDING   Incomplete  CULTURE, BLOOD (ROUTINE X 2)     Status: None   Collection Time    02/02/13  3:40 PM      Result Value Range Status   Specimen Description BLOOD RIGHT HAND   Final   Special Requests BOTTLES DRAWN AEROBIC ONLY 10CC   Final   Culture  Setup  Time 02/03/2013 01:15   Final   Culture     Final   Value:        BLOOD CULTURE RECEIVED NO GROWTH TO DATE CULTURE WILL BE HELD FOR 5 DAYS BEFORE ISSUING A FINAL NEGATIVE REPORT   Report Status PENDING   Incomplete    Studies/Results: Dg Chest Port 1 View  01/29/2013   *RADIOLOGY REPORT*  Clinical Data: Sepsis, hypotension, unresponsive, history hypertension, diabetes, end-stage renal disease on dialysis  PORTABLE CHEST - 1 VIEW  Comparison: Portable exam 0055 hours compared to 12/27/2012  Findings: Upper normal heart size. Atherosclerotic calcification aorta. Stable mediastinal contours and pulmonary vascularity. Bronchitic changes with minimal right basilar atelectasis. No definite acute infiltrate, pleural effusion or pneumothorax. Bilateral chronic rotator cuff tears and AC joint degenerative changes. Scattered endplate spur formation thoracic spine.  IMPRESSION: Bronchitic changes with minimal right basilar atelectasis.   Original Report Authenticated By: Ulyses Southward, M.D.    Medications: Scheduled Meds: .  ceFAZolin (ANCEF) IV  2 g Intravenous Q M,W,F-HD  . cinacalcet  30 mg Oral BID WC  . collagenase   Topical Daily  . [START ON 02/09/2013] darbepoetin (ARANESP) injection - DIALYSIS  100 mcg Intravenous Q Wed-HD  . feeding supplement (NEPRO CARB STEADY)  237 mL Oral BID BM  . feeding supplement  30 mL Oral TID WC  . gabapentin  100 mg Oral Daily  . lanthanum  500 mg Oral TID WC  . metroNIDAZOLE  500 mg Oral Q8H  . midodrine  10 mg Oral TID WC  . multivitamin  1 tablet Oral Daily  . omega-3 acid ethyl esters  1 g Oral Daily  . sodium chloride  3 mL Intravenous Q12H  . vancomycin  1,000 mg Intravenous Q M,W,F-HD      LOS: 10 days   Saurav Crumble M.D. [facility] 02/08/2013, 10:49 AM Pager: [phone]  If 7PM-7AM, please contact night-coverage [email or web address] Password [email or web address]

## 2013-02-08 NOTE — Progress Notes (Signed)
Subjective: Pt states he had a very good night last night and slept the whole night.  He is aware of the surgical debridement today.  His son, POA and daughter visited last night.  Consent is signed and in the chart.  Objective: Vital signs in last 24 hours: Temp:  [96.9 F (36.1 C)-98.8 F (37.1 C)] 98.4 F (36.9 C) (07/01 0446) Pulse Rate:  [64-96] 81 (07/01 0446) Resp:  [16-20] 18 (07/01 0446) BP: (92-149)/(49-66) 114/61 mmHg (07/01 0446) SpO2:  [99 %-100 %] 100 % (07/01 0446) Weight:  [177 lb 14.4 oz (80.695 kg)] 177 lb 14.4 oz (80.695 kg) (06/30 2101) Last BM Date: 02/07/13  Intake/Output from previous day: 06/30 0701 - 07/01 0700 In: 240 [P.O.:240] Out: 986  Intake/Output this shift:    Incision/Wound: sacral decubitus ulcer: moderate amount of necrotic tissue and slough with some undermining.  Lab Results:   Recent Labs  02/06/13 0605 02/07/13 0550  WBC 13.4* 13.2*  HGB 8.9* 9.0*  HCT 27.9* 28.3*  PLT 473* 464*   BMET  Recent Labs  02/06/13 0605 02/07/13 0550  NA 134* 132*  K 4.3 4.3  CL 96 95*  CO2 28 28  GLUCOSE 94 98  BUN 47* 71*  CREATININE 5.35* 6.33*  CALCIUM 8.8 8.7   Anti-infectives: Anti-infectives   Start     Dose/Rate Route Frequency Ordered Stop   02/07/13 1200  vancomycin (VANCOCIN) IVPB 1000 mg/200 mL premix     1,000 mg 200 mL/hr over 60 Minutes Intravenous Every M-W-F (Hemodialysis) 02/04/13 1616     02/04/13 1800  ceFAZolin (ANCEF) IVPB 2 g/50 mL premix     2 g 100 mL/hr over 30 Minutes Intravenous Every M-W-F (Hemodialysis) 02/02/13 1520 03/18/13 1159   02/03/13 1400  metroNIDAZOLE (FLAGYL) tablet 500 mg     500 mg Oral 3 times per day 02/03/13 1105     02/02/13 1530  metroNIDAZOLE (FLAGYL) tablet 500 mg  Status:  Discontinued     500 mg Oral 3 times per day 02/02/13 1520 02/02/13 1520   01/31/13 2200  vancomycin (VANCOCIN) 50 mg/mL oral solution 125 mg  Status:  Discontinued     125 mg Oral Daily 01/31/13 2116 02/03/13  1105   01/31/13 2000  ceFEPIme (MAXIPIME) 2 g in dextrose 5 % 50 mL IVPB  Status:  Discontinued     2 g 100 mL/hr over 30 Minutes Intravenous Every M-W-F (2000) 01/29/13 0546 02/02/13 1032   01/31/13 1200  vancomycin (VANCOCIN) IVPB 750 mg/150 ml premix  Status:  Discontinued     750 mg 150 mL/hr over 60 Minutes Intravenous Every M-W-F (Hemodialysis) 01/29/13 0546 02/04/13 1616   01/29/13 0600  vancomycin (VANCOCIN) IVPB 750 mg/150 ml premix     750 mg 150 mL/hr over 60 Minutes Intravenous  Once 01/29/13 0545 01/29/13 0958   01/29/13 0600  ceFEPIme (MAXIPIME) 2 g in dextrose 5 % 50 mL IVPB     2 g 100 mL/hr over 30 Minutes Intravenous  Once 01/29/13 0546 01/29/13 0909   01/29/13 0045  vancomycin (VANCOCIN) IVPB 1000 mg/200 mL premix     1,000 mg 200 mL/hr over 60 Minutes Intravenous  Once 01/29/13 0033 01/29/13 0226   01/29/13 0045  piperacillin-tazobactam (ZOSYN) IVPB 2.25 g     2.25 g 100 mL/hr over 30 Minutes Intravenous  Once 01/29/13 0033 01/29/13 0333   01/29/13 0045  gentamicin (GARAMYCIN) IVPB 100 mg     100 mg 200 mL/hr over 30 Minutes Intravenous  Once 01/29/13 0043 01/29/13 0442      Assessment/Plan: Large sacral decub s/p bedside debridement  -s/p bedside debridement(02/03/13).  Surgical debridement in the OR today.  Consent is signed and in the chart. -NPO -maximize nutrition, mobility.  -SCDs -on ancef and vanc per ID  Other medical problems managed by nephrology and internal medicine ESRD Anemia Secondary hyperparathyroidism Hypotension MRSA bacteremia/treating as presumed osteo per ID PCM   LOS: 10 days    Bonner Puna [facility] ANP-BC Pager [phone]  02/08/2013 8:50 AM

## 2013-02-08 NOTE — Transfer of Care (Signed)
Immediate Anesthesia Transfer of Care Note  Patient: [name]  Procedure(s) Performed: Procedure(s): DEBRIDEMENT WOUND (N/A)  Patient Location: PACU  Anesthesia Type:General  Level of Consciousness: awake and alert   Airway & Oxygen Therapy: Patient Spontanous Breathing and Patient connected to nasal cannula oxygen  Post-op Assessment: Report given to PACU RN and Post -op Vital signs reviewed and stable  Post vital signs: Reviewed and stable  Complications: No apparent anesthesia complications

## 2013-02-08 NOTE — Anesthesia Preprocedure Evaluation (Signed)
Anesthesia Evaluation  Patient identified by MRN, date of birth, ID band Patient awake    Reviewed: Allergy & Precautions, H&P , NPO status , Patient's Chart, lab work & pertinent test results  Airway Mallampati: I TM Distance: >3 FB Neck ROM: Full    Dental  (+) Chipped, Teeth Intact and Dental Advisory Given,    Pulmonary  breath sounds clear to auscultation        Cardiovascular hypertension, Pt. on medications Rhythm:Regular Rate:Normal     Neuro/Psych Residual weakness L side  Neuromuscular disease CVA    GI/Hepatic   Endo/Other  diabetes  Renal/GU CRF and DialysisRenal disease     Musculoskeletal   Abdominal   Peds  Hematology   Anesthesia Other Findings   Reproductive/Obstetrics                           Anesthesia Physical Anesthesia Plan  ASA: III  Anesthesia Plan: General   Post-op Pain Management:    Induction: Intravenous  Airway Management Planned: Oral ETT  Additional Equipment:   Intra-op Plan:   Post-operative Plan: Extubation in OR  Informed Consent: I have reviewed the patients History and Physical, chart, labs and discussed the procedure including the risks, benefits and alternatives for the proposed anesthesia with the patient or authorized representative who has indicated his/her understanding and acceptance.     Plan Discussed with: CRNA and Surgeon  Anesthesia Plan Comments:         Anesthesia Quick Evaluation

## 2013-02-09 LAB — BASIC METABOLIC PANEL
BUN: 42 mg/dL — ABNORMAL HIGH (ref 6–23)
CO2: 28 mEq/L (ref 19–32)
Calcium: 8.7 mg/dL (ref 8.4–10.5)
Chloride: 96 mEq/L (ref 96–112)
Creatinine, Ser: 4.99 mg/dL — ABNORMAL HIGH (ref 0.50–1.35)
GFR calc Af Amer: 11 mL/min — ABNORMAL LOW (ref >90)
GFR calc non Af Amer: 10 mL/min — ABNORMAL LOW (ref >90)
Glucose, Bld: 105 mg/dL — ABNORMAL HIGH (ref 70–99)
Potassium: 3.8 mEq/L (ref 3.5–5.1)
Sodium: 135 mEq/L (ref 135–145)

## 2013-02-09 LAB — CULTURE, BLOOD (ROUTINE X 2): Culture: NO GROWTH

## 2013-02-09 LAB — CBC
HCT: 26.5 % — ABNORMAL LOW (ref 39.0–52.0)
Hemoglobin: 8.5 g/dL — ABNORMAL LOW (ref 13.0–17.0)
MCH: 26.1 pg (ref 26.0–34.0)
MCHC: 32.1 g/dL (ref 30.0–36.0)
MCV: 81.3 fL (ref 78.0–100.0)
Platelets: 487 10*3/uL — ABNORMAL HIGH (ref 150–400)
RBC: 3.26 MIL/uL — ABNORMAL LOW (ref 4.22–5.81)
RDW: 17.1 % — ABNORMAL HIGH (ref 11.5–15.5)
WBC: 12.2 10*3/uL — ABNORMAL HIGH (ref 4.0–10.5)

## 2013-02-09 MED ORDER — ALTEPLASE 2 MG IJ SOLR
2.0000 mg | Freq: Once | INTRAMUSCULAR | Status: DC | PRN
  Filled 2013-02-09: qty 2

## 2013-02-09 MED ORDER — SODIUM CHLORIDE 0.9 % IV BOLUS (SEPSIS)
250.0000 mL | Freq: Once | INTRAVENOUS | Status: AC
  Administered 2013-02-09: 250 mL via INTRAVENOUS

## 2013-02-09 MED ORDER — SODIUM CHLORIDE 0.9 % IV SOLN: 100.0000 mL | INTRAVENOUS | Status: DC | PRN

## 2013-02-09 MED ORDER — OXYCODONE HCL 5 MG PO TABS
ORAL_TABLET | ORAL | Status: AC
  Administered 2013-02-09: 10 mg via ORAL
  Filled 2013-02-09: qty 2

## 2013-02-09 MED ORDER — LIDOCAINE-PRILOCAINE 2.5-2.5 % EX CREA
1.0000 "application " | TOPICAL_CREAM | CUTANEOUS | Status: DC | PRN
  Filled 2013-02-09: qty 5

## 2013-02-09 MED ORDER — NEPRO/CARBSTEADY PO LIQD
237.0000 mL | ORAL | Status: DC | PRN
  Filled 2013-02-09: qty 237

## 2013-02-09 MED ORDER — MIDODRINE HCL 5 MG PO TABS
ORAL_TABLET | ORAL | Status: AC
  Administered 2013-02-09: 10 mg via ORAL
  Filled 2013-02-09: qty 2

## 2013-02-09 MED ORDER — HEPARIN SODIUM (PORCINE) 1000 UNIT/ML DIALYSIS
20.0000 [IU]/kg | INTRAMUSCULAR | Status: DC | PRN
  Administered 2013-02-09: 1600 [IU] via INTRAVENOUS_CENTRAL

## 2013-02-09 MED ORDER — DARBEPOETIN ALFA-POLYSORBATE 100 MCG/0.5ML IJ SOLN
INTRAMUSCULAR | Status: AC
  Administered 2013-02-09: 100 ug via INTRAVENOUS
  Filled 2013-02-09: qty 0.5

## 2013-02-09 MED ORDER — PENTAFLUOROPROP-TETRAFLUOROETH EX AERO: 1.0000 "application " | INHALATION_SPRAY | CUTANEOUS | Status: DC | PRN

## 2013-02-09 MED ORDER — LIDOCAINE HCL (PF) 1 % IJ SOLN: 5.0000 mL | INTRAMUSCULAR | Status: DC | PRN

## 2013-02-09 MED ORDER — HEPARIN SODIUM (PORCINE) 1000 UNIT/ML DIALYSIS: 1000.0000 [IU] | INTRAMUSCULAR | Status: DC | PRN

## 2013-02-09 NOTE — Progress Notes (Signed)
Patient ID: [name]  male  WUJ:[id number]    DOB: 1931-05-29    DOA: 01/29/2013  PCP: Crawford Givens, MD  Assessment/Plan: Principal Problem: MRSA Sepsis / leukocytosis  - suspected source of entry is sacral decubitus, hypotension improved, fevers resolved, white count trending down  - blood cx both + for MRSA (2/2) - repeat cultures 6/25  - recs per ID:  TTE to evaluate for vegetations shows none- no need for TEE as patient is going to be treated  - presumed osteomyelitis: he will need to vancomycin x 6 wks in addn to gram negative (ancef) and flagyl  - c.difficile infection- resolved- giving flagyl  - The patient is receiving antibiotics with hemodialysis, MWF and Flagyl.  Decubitus ulcer of sacral region, stage 4  - wound care consult performed - wounds are felt to be beyond conservative tx at this time - Palliative Care consult requested to establish GOC with family - plan to continue HTN medical care, general surgery consulted and bedside debridement done with good results- recommending hydrotherapy  - appreciate surgery input, postop day 1, wound VAC placed, change TTS  End stage renal disease  - cont dialysis, HD today  Anemia of chronic disease  - stable   Protein-calorie malnutrition, severe  - nutritional supplements  DM  CBG reasonably controlled at present time  Hx of HTN  Hx of CVA with suspected vascular dementia  Decubitus ulcer of R heel  Severe protein calorie malnutrition  DVT Prophylaxis:  Code Status:  Disposition: Unclear, possibly tomorrow after the wound VAC is changed to snf, if ok with CCS  Subjective: Patient seen earlier this morning, no specific complaints, postop day 1  Objective: Weight change: 0 kg (0 lb)  Intake/Output Summary (Last 24 hours) at 02/09/13 1210 Last data filed at 02/09/13 1200  Gross per 24 hour  Intake    400 ml  Output   2900 ml  Net  -2500 ml   Blood pressure 106/62, pulse 82, temperature 97.4 F (36.3 C),  temperature source Oral, resp. rate 20, height 6\' 6"  (1.981 m), weight 77.9 kg (171 lb 11.8 oz), SpO2 100.00%.  Physical Exam: General: Ax O x3, NAD. CVS: S1-S2 clear, Chest: CTAB anteriorly Abdomen: soft NT, ND, NBS  Extremities: soft boots on the feet no significant edema Wound VAC  Lab Results: Basic Metabolic Panel:  Recent Labs Lab 02/06/13 0605 02/07/13 0550 02/08/13 1643 02/09/13 0820  NA 134* 132* 138 135  K 4.3 4.3 3.5 3.8  CL 96 95*  --  96  CO2 28 28  --  28  GLUCOSE 94 98 87 105*  BUN 47* 71*  --  42*  CREATININE 5.35* 6.33*  --  4.99*  CALCIUM 8.8 8.7  --  8.7  PHOS 4.6  --   --   --    Liver Function Tests:  Recent Labs Lab 02/04/13 0752 02/06/13 0605  ALBUMIN 2.0* 1.9*   No results found for this basename: LIPASE, AMYLASE,  in the last 168 hours No results found for this basename: AMMONIA,  in the last 168 hours CBC:  Recent Labs Lab 02/07/13 0550 02/08/13 1643 02/09/13 0820  WBC 13.2*  --  12.2*  HGB 9.0* 10.5* 8.5*  HCT 28.3* 31.0* 26.5*  MCV 82.7  --  81.3  PLT 464*  --  487*   Cardiac Enzymes: No results found for this basename: CKTOTAL, CKMB, CKMBINDEX, TROPONINI,  in the last 168 hours BNP: No components found with this  basename: POCBNP,  CBG:  Recent Labs Lab 02/02/13 1228 02/08/13 1746  GLUCAP 88 90     Micro Results: Recent Results (from the past 240 hour(s))  CULTURE, BLOOD (ROUTINE X 2)     Status: None   Collection Time    02/02/13  3:30 PM      Result Value Range Status   Specimen Description BLOOD RIGHT ARM   Final   Special Requests BOTTLES DRAWN AEROBIC AND ANAEROBIC 10CC   Final   Culture  Setup Time 02/03/2013 01:15   Final   Culture NO GROWTH 5 DAYS   Final   Report Status 02/09/2013 FINAL   Final  CULTURE, BLOOD (ROUTINE X 2)     Status: None   Collection Time    02/02/13  3:40 PM      Result Value Range Status   Specimen Description BLOOD RIGHT HAND   Final   Special Requests BOTTLES DRAWN AEROBIC  ONLY 10CC   Final   Culture  Setup Time 02/03/2013 01:15   Final   Culture NO GROWTH 5 DAYS   Final   Report Status 02/09/2013 FINAL   Final    Studies/Results: Dg Chest Port 1 View  01/29/2013   *RADIOLOGY REPORT*  Clinical Data: Sepsis, hypotension, unresponsive, history hypertension, diabetes, end-stage renal disease on dialysis  PORTABLE CHEST - 1 VIEW  Comparison: Portable exam 0055 hours compared to 12/27/2012  Findings: Upper normal heart size. Atherosclerotic calcification aorta. Stable mediastinal contours and pulmonary vascularity. Bronchitic changes with minimal right basilar atelectasis. No definite acute infiltrate, pleural effusion or pneumothorax. Bilateral chronic rotator cuff tears and AC joint degenerative changes. Scattered endplate spur formation thoracic spine.  IMPRESSION: Bronchitic changes with minimal right basilar atelectasis.   Original Report Authenticated By: Ulyses Southward, M.D.    Medications: Scheduled Meds: .  ceFAZolin (ANCEF) IV  2 g Intravenous Q M,W,F-HD  . cinacalcet  30 mg Oral BID WC  . darbepoetin (ARANESP) injection - DIALYSIS  100 mcg Intravenous Q Wed-HD  . feeding supplement (NEPRO CARB STEADY)  237 mL Oral BID BM  . feeding supplement  30 mL Oral TID WC  . gabapentin  100 mg Oral Daily  . lanthanum  500 mg Oral TID WC  . metroNIDAZOLE  500 mg Oral Q8H  . midodrine  10 mg Oral TID WC  . multivitamin  1 tablet Oral Daily  . omega-3 acid ethyl esters  1 g Oral Daily  . sodium chloride  3 mL Intravenous Q12H  . vancomycin  1,000 mg Intravenous Q M,W,F-HD      LOS: 11 days   RAI,RIPUDEEP M.D. [facility] 02/09/2013, 12:10 PM Pager: [phone]  If 7PM-7AM, please contact night-coverage [email or web address] Password [email or web address]

## 2013-02-09 NOTE — Progress Notes (Signed)
Patient ID: [name], male   DOB: [birth year], 79 y.o.   MRN: [id number] 1 Day Post-Op  Subjective: No complaints this am, on dialysis, denies sacral pain.  Objective: Vital signs in last 24 hours: Temp:  [97 F (36.1 C)-98.6 F (37 C)] 98.5 F (36.9 C) (07/02 0750) Pulse Rate:  [62-87] 78 (07/02 0830) Resp:  [13-23] 18 (07/02 0750) BP: (94-149)/(48-79) 95/57 mmHg (07/02 0830) SpO2:  [95 %-100 %] 97 % (07/02 0750) Weight:  [177 lb 14.4 oz (80.695 kg)-178 lb 2.1 oz (80.8 kg)] 178 lb 2.1 oz (80.8 kg) (07/02 0750) Last BM Date: 02/08/13  Intake/Output from previous day: 07/01 0701 - 07/02 0700 In: 400 [I.V.:400] Out: 400 [Urine:400] Intake/Output this shift:    Incision/Wound: vac in place but not able to see it due to being in dialysis, good seal on vac machine, no reports of problems Lab Results:   Recent Labs  02/07/13 0550 02/08/13 1643 02/09/13 0820  WBC 13.2*  --  12.2*  HGB 9.0* 10.5* 8.5*  HCT 28.3* 31.0* 26.5*  PLT 464*  --  487*   BMET  Recent Labs  02/07/13 0550 02/08/13 1643 02/09/13 0820  NA 132* 138 135  K 4.3 3.5 3.8  CL 95*  --  96  CO2 28  --  28  GLUCOSE 98 87 105*  BUN 71*  --  42*  CREATININE 6.33*  --  4.99*  CALCIUM 8.7  --  8.7   Anti-infectives: Anti-infectives   Start     Dose/Rate Route Frequency Ordered Stop   02/07/13 1200  vancomycin (VANCOCIN) IVPB 1000 mg/200 mL premix     1,000 mg 200 mL/hr over 60 Minutes Intravenous Every M-W-F (Hemodialysis) 02/04/13 1616     02/04/13 1800  ceFAZolin (ANCEF) IVPB 2 g/50 mL premix     2 g 100 mL/hr over 30 Minutes Intravenous Every M-W-F (Hemodialysis) 02/02/13 1520 03/18/13 1159   02/03/13 1400  metroNIDAZOLE (FLAGYL) tablet 500 mg     500 mg Oral 3 times per day 02/03/13 1105     02/02/13 1530  metroNIDAZOLE (FLAGYL) tablet 500 mg  Status:  Discontinued     500 mg Oral 3 times per day 02/02/13 1520 02/02/13 1520   01/31/13 2200  vancomycin (VANCOCIN) 50 mg/mL oral solution 125 mg   Status:  Discontinued     125 mg Oral Daily 01/31/13 2116 02/03/13 1105   01/31/13 2000  ceFEPIme (MAXIPIME) 2 g in dextrose 5 % 50 mL IVPB  Status:  Discontinued     2 g 100 mL/hr over 30 Minutes Intravenous Every M-W-F (2000) 01/29/13 0546 02/02/13 1032   01/31/13 1200  vancomycin (VANCOCIN) IVPB 750 mg/150 ml premix  Status:  Discontinued     750 mg 150 mL/hr over 60 Minutes Intravenous Every M-W-F (Hemodialysis) 01/29/13 0546 02/04/13 1616   01/29/13 0600  vancomycin (VANCOCIN) IVPB 750 mg/150 ml premix     750 mg 150 mL/hr over 60 Minutes Intravenous  Once 01/29/13 0545 01/29/13 0958   01/29/13 0600  ceFEPIme (MAXIPIME) 2 g in dextrose 5 % 50 mL IVPB     2 g 100 mL/hr over 30 Minutes Intravenous  Once 01/29/13 0546 01/29/13 0909   01/29/13 0045  vancomycin (VANCOCIN) IVPB 1000 mg/200 mL premix     1,000 mg 200 mL/hr over 60 Minutes Intravenous  Once 01/29/13 0033 01/29/13 0226   01/29/13 0045  piperacillin-tazobactam (ZOSYN) IVPB 2.25 g     2.25 g 100 mL/hr over  30 Minutes Intravenous  Once 01/29/13 0033 01/29/13 0333   01/29/13 0045  gentamicin (GARAMYCIN) IVPB 100 mg     100 mg 200 mL/hr over 30 Minutes Intravenous  Once 01/29/13 0043 01/29/13 0442      Assessment/Plan: Large sacral decub s/p bedside debridement  -s/p bedside debridement(02/03/13).  OR debridement 02/08/13   -wound vac change starting friday -maximize nutrition, mobility.  -SCDs -on ancef and vanc per ID  Other medical problems managed by nephrology and internal medicine ESRD Anemia Secondary hyperparathyroidism Hypotension MRSA bacteremia/treating as presumed osteo per ID PCM   LOS: 11 days    Harmony Sandell  02/09/2013 8:59 AM

## 2013-02-09 NOTE — Procedures (Signed)
I was present at this dialysis session. I have reviewed the session itself and made appropriate changes.   [name]  MD Pager [phone]    Cell  [phone] 02/09/2013, 11:22 AM

## 2013-02-09 NOTE — Progress Notes (Signed)
POD#1 sacral decubitus debridement/ VAC placement  Change VAC Tu/Th/Sat  Wilmon Arms. Corliss Skains, MD, [facility]  General/ Trauma Surgery  02/09/2013 9:50 AM

## 2013-02-09 NOTE — Progress Notes (Signed)
Chaplain Note: When I arrived pt was lying in bed trying to make a phone call...tv was on.  Pt welcomed visit and was very pleasant and quite talkative.  Pt stated he was trying to reach his son and daughter to bring his clothes and pick him up.  Pt seemed somewhat  confused...stated he came here this morning for dialysis.  Provided support and ministry of presence. Will follow up as  As needed.  Rutherford Nail Chaplain [phone]

## 2013-02-09 NOTE — Progress Notes (Signed)
Subjective:  ON HD, no cos Objective Vital signs in last 24 hours: Filed Vitals:   02/09/13 0736 02/09/13 0750 02/09/13 0800 02/09/13 0830  BP: 114/62 111/63 99/57 95/57   Pulse:  78 83 78  Temp: 97 F (36.1 C) 98.5 F (36.9 C)    TempSrc: Oral Oral    Resp:  18    Height:      Weight:  80.8 kg (178 lb 2.1 oz)    SpO2:  97%     Weight change: 0 kg (0 lb)  Intake/Output Summary (Last 24 hours) at 02/09/13 0901 Last data filed at 02/09/13 0535  Gross per 24 hour  Intake    400 ml  Output    400 ml  Net      0 ml   Labs: Basic Metabolic Panel:  Recent Labs Lab 02/04/13 0752 02/06/13 0605 02/07/13 0550 02/08/13 1643 02/09/13 0820  NA 132* 134* 132* 138 135  K 3.7 4.3 4.3 3.5 3.8  CL 95* 96 95*  --  96  CO2 25 28 28   --  28  GLUCOSE 111* 94 98 87 105*  BUN 44* 47* 71*  --  42*  CREATININE 5.30* 5.35* 6.33*  --  4.99*  CALCIUM 9.2 8.8 8.7  --  8.7  PHOS 4.7* 4.6  --   --   --    Liver Function Tests:  Recent Labs Lab 02/04/13 0752 02/06/13 0605  ALBUMIN 2.0* 1.9*   No results found for this basename: LIPASE, AMYLASE,  in the last 168 hours No results found for this basename: AMMONIA,  in the last 168 hours CBC:  Recent Labs Lab 02/03/13 0525 02/04/13 0752 02/06/13 0605 02/07/13 0550 02/08/13 1643 02/09/13 0820  WBC 16.0* 15.1* 13.4* 13.2*  --  12.2*  HGB 8.6* 9.2* 8.9* 9.0* 10.5* 8.5*  HCT 27.4* 27.9* 27.9* 28.3* 31.0* 26.5*  MCV 83.8 82.5 84.0 82.7  --  81.3  PLT 493* 509* 473* 464*  --  487*   Cardiac Enzymes: No results found for this basename: CKTOTAL, CKMB, CKMBINDEX, TROPONINI,  in the last 168 hours CBG:  Recent Labs Lab 02/02/13 1228 02/08/13 1746  GLUCAP 88 90    Iron Studies: No results found for this basename: IRON, TIBC, TRANSFERRIN, FERRITIN,  in the last 72 hours Studies/Results: No results found. Medications: . sodium chloride 50 mL/hr at 02/09/13 0603   .  ceFAZolin (ANCEF) IV  2 g Intravenous Q M,W,F-HD  . cinacalcet   30 mg Oral BID WC  . darbepoetin (ARANESP) injection - DIALYSIS  100 mcg Intravenous Q Wed-HD  . feeding supplement (NEPRO CARB STEADY)  237 mL Oral BID BM  . feeding supplement  30 mL Oral TID WC  . gabapentin  100 mg Oral Daily  . lanthanum  500 mg Oral TID WC  . metroNIDAZOLE  500 mg Oral Q8H  . midodrine  10 mg Oral TID WC  . multivitamin  1 tablet Oral Daily  . omega-3 acid ethyl esters  1 g Oral Daily  . sodium chloride  3 mL Intravenous Q12H  . vancomycin  1,000 mg Intravenous Q M,W,F-HD     Physical Exam: General: On HD, alert, NAD, thin BM pleasant Heart: RRR Lungs: CTA anterior fields Abdomen: BS positive, soft, nontender Extremities: Dialysis Access: Bipedal edema 2 +,with soft boots bipedal/ LU A AVF patent on HD  Outpatient HD (MWF [facility])  4hrs   77.5kg    2Ca  LUA AVF  Heparin 5000 EPO 6200  Assess/Plan 1 MRSA sepsis- Vanc x 6 weeks+ ancef, echo neg  2 CKD, cont hd MWF 3 Anemia - prbc x 1 on 6/21, aranesp 100 q Monday 4 Sec HPT, cont sensipar and binders P 4.6  5 BP/volume- cont midodrine, 3kg up, pedal edema, UF as tol 6 Malnutrition - Alb 1.9; renal diet; nepro bid, prostat - npo today for surgery 7 Sacral decub- s/p I&D, wound VAC 7/2, foley to dec contamination  8 Cdiff +, cont po flagyl  9 Dispo- not sure he will be able to dialyze in chair with wound VAC, will check what restrictions are in place   Lenny Pastel, PA-C [facility] Beeper [phone] 02/09/2013,9:01 AM  LOS: 11 days   Patient seen and examined.  Agree with assessment and plan as above. [name]  MD Pager [phone]    Cell  [phone] 02/09/2013, 11:19 AM

## 2013-02-09 NOTE — Progress Notes (Signed)
Resting in bed, turn and reposition for comfort, wound vac in progress at 125, foley cath to bedside drain with yellow urine noted. Breath sounds diminished bilaterally.  Patient denies pain at this time.  Call light in reach.

## 2013-02-09 NOTE — Clinical Social Work Note (Signed)
Patient is from [place] skilled nursing facility. CSW will follow-up with family regarding return to [place] at discharge.  Genelle Bal, MSW, LCSW [phone]

## 2013-02-09 NOTE — Progress Notes (Signed)
Call placed to Dr. Craige Cotta informed patient blood pressure 92/48, patient is asymptomatic.

## 2013-02-09 NOTE — Progress Notes (Signed)
Patient ID: [name], male   DOB: [birth year], 79 y.o.   MRN: [id number] Reason for procedure: Large, non healing, sacral decubitus. Inspection of the wound was made, because of significant necrotic and/or infected tissue was present debridement is recommended to help improve wound health and speed healing.  Procedure Note: Sharp dissection was used to remove necrotic tissue and reveal more healthy, bleeding tissue.  Once good hemostasis was obtained the wound was packed & dry dressing was applied.  The patient tolerated the procedure well.  Wound measurements prior to debridement: 10cm x 10cm x 2cm with undermining of about 1.5cm at 10 and 2 positions  Wound sized unchanged after debridement with improvement of necrotic tissue  WHITE, ELIZABETH 8:19 AM

## 2013-02-09 NOTE — Anesthesia Postprocedure Evaluation (Signed)
  Anesthesia Post-op Note  Patient: [name]  Procedure(s) Performed: Procedure(s): DEBRIDEMENT WOUND (N/A)  Patient Location: PACU  Anesthesia Type:General  Level of Consciousness: awake  Airway and Oxygen Therapy: Patient Spontanous Breathing  Post-op Pain: mild  Post-op Assessment: Post-op Vital signs reviewed, Patient's Cardiovascular Status Stable, Respiratory Function Stable, Patent Airway, No signs of Nausea or vomiting and Pain level controlled  Post-op Vital Signs: stable  Complications: No apparent anesthesia complications

## 2013-02-10 ENCOUNTER — Encounter (HOSPITAL_COMMUNITY): Payer: Self-pay | Admitting: Surgery

## 2013-02-10 NOTE — Progress Notes (Signed)
VAC change today.  Wilmon Arms. Corliss Skains, MD, [facility] Surgery  General/ Trauma Surgery  02/10/2013 10:26 AM

## 2013-02-10 NOTE — Progress Notes (Signed)
Patient ID: [name]  male  UJW:[id number]    DOB: 09/08/1930    DOA: 01/29/2013  PCP: Crawford Givens, MD  Assessment/Plan: Principal Problem: MRSA Sepsis / leukocytosis  - suspected source of entry is sacral decubitus, hypotension improved, fevers resolved, white count trending down  - blood cx both + for MRSA (2/2), negative repeat cultures 6/25  - recs per ID:  TTE to evaluate for vegetations shows none- no need for TEE as patient is going to be treated  - presumed osteomyelitis: he will need to vancomycin x 6 wks in addn to gram negative (ancef) and flagyl  - c.difficile infection- resolved- giving flagyl  - The patient is receiving antibiotics with hemodialysis, MWF and Flagyl.  Decubitus ulcer of sacral region, stage 4  - wound care consult performed - wounds are felt to be beyond conservative tx at this time - Palliative Care consult requested to establish GOC with family - plan to continue HTN medical care, general surgery consulted and bedside debridement done with good results- recommending hydrotherapy  - appreciate surgery input, postop day 2, wound VAC placed, change TTS, CCS following  End stage renal disease  - cont dialysis  Anemia of chronic disease  - stable   Protein-calorie malnutrition, severe  - nutritional supplements  DM  CBG reasonably controlled at present time  Hx of HTN  Hx of CVA with suspected vascular dementia  Decubitus ulcer of R heel  Severe protein calorie malnutrition  DVT Prophylaxis:  Code Status:  Disposition: d/w renal service, will not be able to HD in chair due to wound vac, [facility] may [facility] a better choice for disposition. D/W CM and SW, will call [facility] today. Wound vac to be changed today.     Subjective: Patient seen earlier this morning, no specific complaints, postop day 2  Objective: Weight change: 0.105 kg (3.7 oz)  Intake/Output Summary (Last 24 hours) at 02/10/13 0925 Last data filed at 02/10/13 0918  Gross per 24 hour   Intake 1084.17 ml  Output   2850 ml  Net -1765.83 ml   Blood pressure 128/70, pulse 99, temperature 98.7 F (37.1 C), temperature source Oral, resp. rate 17, height 6\' 6"  (1.981 m), weight 82.3 kg (181 lb 7 oz), SpO2 99.00%.  Physical Exam: General: Ax O, NAD. CVS: S1-S2 clear, Chest: CTAB anteriorly Abdomen: soft NT, ND, NBS  Extremities: soft boots on the feet no significant edema Wound VAC +  Lab Results: Basic Metabolic Panel:  Recent Labs Lab 02/06/13 0605 02/07/13 0550 02/08/13 1643 02/09/13 0820  NA 134* 132* 138 135  K 4.3 4.3 3.5 3.8  CL 96 95*  --  96  CO2 28 28  --  28  GLUCOSE 94 98 87 105*  BUN 47* 71*  --  42*  CREATININE 5.35* 6.33*  --  4.99*  CALCIUM 8.8 8.7  --  8.7  PHOS 4.6  --   --   --    Liver Function Tests:  Recent Labs Lab 02/04/13 0752 02/06/13 0605  ALBUMIN 2.0* 1.9*   No results found for this basename: LIPASE, AMYLASE,  in the last 168 hours No results found for this basename: AMMONIA,  in the last 168 hours CBC:  Recent Labs Lab 02/07/13 0550 02/08/13 1643 02/09/13 0820  WBC 13.2*  --  12.2*  HGB 9.0* 10.5* 8.5*  HCT 28.3* 31.0* 26.5*  MCV 82.7  --  81.3  PLT 464*  --  487*   Cardiac Enzymes: No  results found for this basename: CKTOTAL, CKMB, CKMBINDEX, TROPONINI,  in the last 168 hours BNP: No components found with this basename: POCBNP,  CBG:  Recent Labs Lab 02/08/13 1746  GLUCAP 90     Micro Results: Recent Results (from the past 240 hour(s))  CULTURE, BLOOD (ROUTINE X 2)     Status: None   Collection Time    02/02/13  3:30 PM      Result Value Range Status   Specimen Description BLOOD RIGHT ARM   Final   Special Requests BOTTLES DRAWN AEROBIC AND ANAEROBIC 10CC   Final   Culture  Setup Time 02/03/2013 01:15   Final   Culture NO GROWTH 5 DAYS   Final   Report Status 02/09/2013 FINAL   Final  CULTURE, BLOOD (ROUTINE X 2)     Status: None   Collection Time    02/02/13  3:40 PM      Result Value  Range Status   Specimen Description BLOOD RIGHT HAND   Final   Special Requests BOTTLES DRAWN AEROBIC ONLY 10CC   Final   Culture  Setup Time 02/03/2013 01:15   Final   Culture NO GROWTH 5 DAYS   Final   Report Status 02/09/2013 FINAL   Final    Studies/Results: Dg Chest Port 1 View  01/29/2013   *RADIOLOGY REPORT*  Clinical Data: Sepsis, hypotension, unresponsive, history hypertension, diabetes, end-stage renal disease on dialysis  PORTABLE CHEST - 1 VIEW  Comparison: Portable exam 0055 hours compared to 12/27/2012  Findings: Upper normal heart size. Atherosclerotic calcification aorta. Stable mediastinal contours and pulmonary vascularity. Bronchitic changes with minimal right basilar atelectasis. No definite acute infiltrate, pleural effusion or pneumothorax. Bilateral chronic rotator cuff tears and AC joint degenerative changes. Scattered endplate spur formation thoracic spine.  IMPRESSION: Bronchitic changes with minimal right basilar atelectasis.   Original Report Authenticated By: Ulyses Southward, M.D.    Medications: Scheduled Meds: .  ceFAZolin (ANCEF) IV  2 g Intravenous Q M,W,F-HD  . cinacalcet  30 mg Oral BID WC  . darbepoetin (ARANESP) injection - DIALYSIS  100 mcg Intravenous Q Wed-HD  . feeding supplement (NEPRO CARB STEADY)  237 mL Oral BID BM  . feeding supplement  30 mL Oral TID WC  . gabapentin  100 mg Oral Daily  . lanthanum  500 mg Oral TID WC  . metroNIDAZOLE  500 mg Oral Q8H  . midodrine  10 mg Oral TID WC  . multivitamin  1 tablet Oral Daily  . omega-3 acid ethyl esters  1 g Oral Daily  . sodium chloride  3 mL Intravenous Q12H  . vancomycin  1,000 mg Intravenous Q M,W,F-HD      LOS: 12 days   RAI,RIPUDEEP M.D. [facility] 02/10/2013, 9:25 AM Pager: [phone]  If 7PM-7AM, please contact night-coverage [email or web address] Password [email or web address]

## 2013-02-10 NOTE — Care Management Note (Signed)
    Page 1 of 1   02/10/2013     2:11:46 PM   CARE MANAGEMENT NOTE 02/10/2013  Patient:  LORING, LISKEY   Account Number:  [id number]  Date Initiated:  02/08/2013  Documentation initiated by:  Johny Shock  Subjective/Objective Assessment:   Pt from SNF, will return to SNF when medically stable     Action/Plan:   CSW following, CM following for any assistance as needed.   Anticipated DC Date:  02/13/2013   Anticipated DC Plan:           Choice offered to / List presented to:             Status of service:   Medicare Important Message given?   (If response is "NO", the following Medicare IM given date fields will be blank) Date Medicare IM given:   Date Additional Medicare IM given:    Discharge Disposition:    Per UR Regulation:    If discussed at Long Length of Stay Meetings, dates discussed:    Comments:  02/10/13-1327-J.Juna Caban,RN,BSN [phone]      Spoke with Dr. Isidoro Donning. Informed of conversation with Boneta Lucks from [employer] regarding insurance inelligibility. Dr. Isidoro Donning reports that attempt to dialyze patient in chair with wound vac will be made tomorrow. Disposition will be determined after that trial. Potential discharge back to [facility] if all goes well on Friday after dialysis. CSW notified of conversations.  02/10/13-1319-J.Keisy Strickler,RN,BSN [phone]      Received return call from Bufford Lope for [facility]. Reports that patient has [employer] Medicare Complete which is a [employer] which will not cover long term acute care. Dr. Isidoro Donning will be notified by CM of conversation outcome.  02/10/13-0927-J.Asra Gambrel,RN,BSN [phone]      CM spoke with Dr. Isidoro Donning regarding disposition. Requested contact be made to see if patient is appropriate for LTAC. "Patient can not sit up for dialysis with his wound vac." CM will contact Boneta Lucks with [facility].

## 2013-02-10 NOTE — Progress Notes (Signed)
Physical Therapy Wound Treatment Patient Details  Name: [name] MRN: [id number] Date of Birth: [birth year]  Today's Date: 02/10/2013 Time: 1010-1031 Time Calculation (min): 21 min  Subjective  Subjective: It's a bit painful Patient and Family Stated Goals: not stated Prior Treatments: pulsatile lavage, debridement  Pain Score: Pain Score: 0-No pain  Wound Assessment  Pressure Ulcer 12/27/12 Unstageable - Full thickness tissue loss in which the base of the ulcer is covered by slough (yellow, tan, gray, green or brown) and/or eschar (tan, brown or black) in the wound bed. (Active)  State of Healing Early/partial granulation 02/10/2013 10:51 AM  Site / Wound Assessment Yellow;Pink;Red 02/10/2013 10:51 AM  % Wound base Red or Granulating 75% 02/10/2013 10:51 AM  % Wound base Yellow 20% 02/10/2013 10:51 AM  % Wound base Black 0% 02/10/2013 10:51 AM  % Wound base Other (Comment) 5% 02/10/2013 10:51 AM  Peri-wound Assessment Purple 02/04/2013  2:24 PM  Wound Length (cm) 17 cm 02/10/2013 10:51 AM  Wound Width (cm) 7 cm 02/10/2013 10:51 AM  Wound Depth (cm) 3 cm 02/10/2013 10:51 AM  Tunneling (cm) 0 02/02/2013 12:30 PM  Undermining (cm) 1 02/02/2013 12:30 PM  Margins Unattacted edges (unapproximated) 02/10/2013 10:51 AM  Drainage Amount Other (Comment) 02/10/2013 10:51 AM  Drainage Description Serosanguineous 02/10/2013 10:51 AM  Treatment Cleansed;Debridement (Selective);Hydrotherapy (Ultrasonic mist);Other (Comment) 02/10/2013 10:51 AM  Dressing Type Negative pressure wound therapy 02/09/2013  7:35 PM  Dressing Clean;Dry;Intact 02/09/2013  7:35 PM     Pressure Ulcer 01/29/13 Deep Tissue Injury - Purple or maroon localized area of discolored intact skin or blood-filled blister due to damage of underlying soft tissue from pressure and/or shear. Black and purple. Closed  (Active)  State of Healing Early/partial granulation 02/09/2013  4:32 PM  Site / Wound Assessment Black;Granulation tissue 02/09/2013  4:32 PM  % Wound  base Red or Granulating 60% 02/02/2013 12:30 PM  % Wound base Yellow 0% 02/02/2013 12:30 PM  % Wound base Black 40% 02/02/2013 12:30 PM  Peri-wound Assessment Edema 02/04/2013 12:03 PM  Wound Length (cm) 3 cm 02/09/2013  4:32 PM  Wound Width (cm) 2 cm 02/09/2013  4:32 PM  Wound Depth (cm) 0 cm 02/09/2013  4:32 PM  Tunneling (cm) 0 02/02/2013 12:30 PM  Undermining (cm) 0 02/02/2013 12:30 PM  Margins Unattacted edges (unapproximated) 02/02/2013 12:30 PM  Drainage Amount Scant 02/09/2013  4:32 PM  Drainage Description Other (Comment) 02/09/2013  4:32 PM  Treatment Other (Comment) 02/09/2013  4:32 PM  Dressing Type Foam 02/09/2013  7:35 PM  Dressing Clean;Dry;Intact 02/09/2013  7:35 PM     Pressure Ulcer 02/03/13 Deep Tissue Injury - Purple or maroon localized area of discolored intact skin or blood-filled blister due to damage of underlying soft tissue from pressure and/or shear. (Active)  State of Healing Other (Comment) 02/09/2013  4:32 PM  Site / Wound Assessment Black;Other (Comment) 02/09/2013  4:32 PM  Peri-wound Assessment Intact 02/09/2013  4:32 PM  Wound Length (cm) 2.5 cm 02/09/2013  4:32 PM  Wound Width (cm) 4 cm 02/09/2013  4:32 PM  Wound Depth (cm) 0 cm 02/09/2013  4:32 PM  Drainage Amount Minimal 02/09/2013  4:32 PM  Treatment Other (Comment) 02/09/2013  4:32 PM  Dressing Type Foam 02/09/2013  7:35 PM  Dressing Clean;Dry;Intact 02/09/2013  7:35 PM     Wound 12/27/12 Other (Comment) Heel Right (Active)  Site / Wound Assessment Black;Granulation tissue 02/09/2013  4:32 PM  % Wound base Red or Granulating 0% 02/02/2013 12:30 PM  %  Wound base Yellow 25% 02/02/2013 12:30 PM  % Wound base Black 65% 02/02/2013 12:30 PM  % Wound base Other (Comment) 0% 02/02/2013 12:30 PM  Peri-wound Assessment Purple;Maceration 02/09/2013  4:32 PM  Wound Length (cm) 4 cm 02/09/2013  4:32 PM  Wound Width (cm) 7.5 cm 02/09/2013  4:32 PM  Wound Depth (cm) 0 cm 02/09/2013  4:32 PM  Tunneling (cm) 0 02/02/2013 12:30 PM  Undermining (cm) 0 02/02/2013  12:30 PM  Margins Unattacted edges (unapproximated) 02/02/2013 12:30 PM  Closure None 02/02/2013 12:30 PM  Drainage Amount Minimal 02/09/2013  4:32 PM  Treatment Cleansed 02/02/2013 12:30 PM  Dressing Type Foam 02/09/2013  4:32 PM  Dressing Changed Changed 02/09/2013  4:32 PM  Dressing Status Clean;Dry;Intact 02/09/2013  4:32 PM     Wound 12/27/12 Other (Comment) Toe (Comment  which one) Right (Active)  Site / Wound Assessment Clean;Dry;Black 02/08/2013 10:27 PM  % Wound base Red or Granulating 0% 02/02/2013 12:30 PM  % Wound base Yellow 0% 02/02/2013 12:30 PM  % Wound base Black 100% 02/02/2013 12:30 PM  % Wound base Other (Comment) 0% 02/02/2013 12:30 PM  Peri-wound Assessment Intact 02/02/2013 12:30 PM  Wound Length (cm) 2 cm 02/02/2013 12:30 PM  Wound Width (cm) 1.5 cm 02/02/2013 12:30 PM  Wound Depth (cm) 1 cm 02/02/2013 12:30 PM  Tunneling (cm) 0.1 12/31/2012  8:39 AM  Undermining (cm) 0.1 12/31/2012  8:39 AM  Margins Attached edges (approximated) 02/03/2013  7:30 PM  Closure None 02/03/2013  7:30 PM  Drainage Amount None 02/03/2013  7:30 PM  Treatment Cleansed 02/02/2013 12:30 PM  Dressing Type None 02/08/2013 10:27 PM     Incision 04/13/12 Arm Left (Active)     Incision 02/08/13 Other (Comment) Other (Comment) (Active)  Site / Wound Assessment Clean;Dry 02/08/2013 10:27 PM  Drainage Amount Minimal 02/08/2013 10:27 PM  Drainage Description Serosanguineous 02/08/2013 10:27 PM  Dressing Type Negative pressure wound therapy 02/08/2013 10:27 PM  Dressing Dry;Intact;Clean 02/08/2013 10:27 PM   Hydrotherapy Ultrasonic mist  - wound location: sacrum Ultrasonic mist at 35KHz (+/-3KHz) at ___ percent: 100 % Ultrasonic mist therapy minutes: 6 min Selective Debridement Selective Debridement - Location: sacrum Selective Debridement - Tools Used: Forceps;Scissors Selective Debridement - Tissue Removed: yellow slough   Wound Assessment and Plan  Wound Therapy - Assess/Plan/Recommendations Wound Therapy - Clinical  Statement: Pt with large sacral wound that can benefit from hydrotherapy to decr necrotic tissue and promote healing. Hydrotherapy Plan: Debridement;Dressing change;Patient/family education;Ultrasonic wound therapy @35  KHz (+/- 3 KHz) Wound Therapy - Frequency: 6X / week Wound Therapy - Follow Up Recommendations: Skilled nursing facility Wound Plan: decr. bacterial load and selectively debide to healthy tissues  Wound Therapy Goals- Improve the function of patient's integumentary system by progressing the wound(s) through the phases of wound healing (inflammation - proliferation - remodeling) by: Decrease Necrotic Tissue to: 10 Decrease Necrotic Tissue - Progress: Updated due to goal met Increase Granulation Tissue to: 90 (incl. healthy CT) Increase Granulation Tissue - Progress: Updated due to goal met Decrease Length/Width/Depth - Progress: Goal set today Improve Drainage Characteristics: Serous Improve Drainage Characteristics - Progress: Progressing toward goal Goals/treatment plan/discharge plan were made with and agreed upon by patient/family: Yes Time For Goal Achievement: 2 weeks Wound Therapy - Potential for Goals: Good  Goals will be updated until maximal potential achieved or discharge criteria met.  Discharge criteria: when goals achieved, discharge from hospital, MD decision/surgical intervention, no progress towards goals, refusal/missing three consecutive treatments without notification or medical  reason.  GP     Juletta Berhe, [name] 02/10/2013, 10:58 AM  02/10/2013  [place] Bing, PT [phone]  (pager)

## 2013-02-10 NOTE — Progress Notes (Signed)
Physical Therapy Treatment Patient Details Name: [name] MRN: [id number] DOB: 01/24/1931 Today's Date: 02/10/2013 Time: 1000-1010 PT Time Calculation (min): 10 min  PT Assessment / Plan / Recommendation  PT Comments   More participative than able to be last admission.  Follow Up Recommendations  SNF     Does the patient have the potential to tolerate intense rehabilitation     Barriers to Discharge        Equipment Recommendations  None recommended by PT    Recommendations for Other Services    Frequency Min 2X/week   Progress towards PT Goals Progress towards PT goals: Progressing toward goals  Plan Current plan remains appropriate    Precautions / Restrictions Precautions Precautions: Fall Restrictions Weight Bearing Restrictions: No   Pertinent Vitals/Pain     Mobility  Bed Mobility Rolling Left: 2: Max assist Left Sidelying to Sit: 2: Max assist;With rails;HOB flat Sit to Sidelying Left: 2: Max assist;With rail;HOB flat Details for Bed Mobility Assistance: vc/tc's for guidance and technique, pt not resistant and was anxious but participative Transfers Transfers: Not assessed Ambulation/Gait Ambulation/Gait Assistance: Not tested (comment) Stairs: No Wheelchair Mobility Wheelchair Mobility: No    Exercises     PT Diagnosis:    PT Problem List:   PT Treatment Interventions:     PT Goals (current goals can now be found in the care plan section) Acute Rehab PT Goals Patient Stated Goal: Not stated.  Visit Information  Last PT Received On: 02/10/13 Assistance Needed: +2 History of Present Illness: Pt returns to [facility] from [place] with decr H/H.  Sacral decubitus and heel ulcers remain.  Now with VAC on Sacral decubitus.    Subjective Data  Patient Stated Goal: Not stated.   Cognition  Cognition Arousal/Alertness: Awake/alert Behavior During Therapy: Anxious Overall Cognitive Status: No family/caregiver present to determine baseline cognitive  functioning Area of Impairment: Following commands;Problem solving Following Commands: Follows one step commands with increased time Problem Solving: Slow processing;Decreased initiation    Balance  Balance Balance Assessed: Yes Static Sitting Balance Static Sitting - Balance Support: Feet supported;Bilateral upper extremity supported Static Sitting - Level of Assistance: Other (comment) (min guard to mod Pt>50% depending on pt's anxiety) Static Sitting - Comment/# of Minutes: 8 minutes sitting EOB working on balance and adding dynamic components (ie kicking out legs (LAQ)  End of Session PT - End of Session Activity Tolerance: Patient tolerated treatment well Patient left: in bed;with call bell/phone within reach Nurse Communication: Mobility status   GP     Zariana Strub, [name] 02/10/2013, 11:18 AM 02/10/2013  [place] Bing, PT [phone]  (pager)

## 2013-02-10 NOTE — Progress Notes (Signed)
Subjective:  No cos Objective Vital signs in last 24 hours: Filed Vitals:   02/09/13 1659 02/09/13 2111 02/10/13 0512 02/10/13 0917  BP: 122/65 136/70 135/68 128/70  Pulse: 91 97 87 99  Temp: 98.1 F (36.7 C) 98.4 F (36.9 C) 98.4 F (36.9 C) 98.7 F (37.1 C)  TempSrc:  Oral Oral   Resp: 18 16  17   Height:      Weight:  82.3 kg (181 lb 7 oz)    SpO2: 100% 97% 98% 99%   Weight change: 0.105 kg (3.7 oz)  Intake/Output Summary (Last 24 hours) at 02/10/13 1241 Last data filed at 02/10/13 0918  Gross per 24 hour  Intake 1084.17 ml  Output    350 ml  Net 734.17 ml   Labs: Basic Metabolic Panel:  Recent Labs Lab 02/04/13 0752 02/06/13 0605 02/07/13 0550 02/08/13 1643 02/09/13 0820  NA 132* 134* 132* 138 135  K 3.7 4.3 4.3 3.5 3.8  CL 95* 96 95*  --  96  CO2 25 28 28   --  28  GLUCOSE 111* 94 98 87 105*  BUN 44* 47* 71*  --  42*  CREATININE 5.30* 5.35* 6.33*  --  4.99*  CALCIUM 9.2 8.8 8.7  --  8.7  PHOS 4.7* 4.6  --   --   --    Liver Function Tests:  Recent Labs Lab 02/04/13 0752 02/06/13 0605  ALBUMIN 2.0* 1.9*   No results found for this basename: LIPASE, AMYLASE,  in the last 168 hours No results found for this basename: AMMONIA,  in the last 168 hours CBC:  Recent Labs Lab 02/04/13 0752 02/06/13 0605 02/07/13 0550 02/08/13 1643 02/09/13 0820  WBC 15.1* 13.4* 13.2*  --  12.2*  HGB 9.2* 8.9* 9.0* 10.5* 8.5*  HCT 27.9* 27.9* 28.3* 31.0* 26.5*  MCV 82.5 84.0 82.7  --  81.3  PLT 509* 473* 464*  --  487*   CCBG:  Recent Labs Lab 02/08/13 1746  GLUCAP 90   Physical Exam:  General, alert, NAD, thin BM pleasant  Heart: RRR  Lungs: CTA Bilaterally Abdomen: BS positive, soft, nontender  Extremities: Dialysis Access: Bipedal edema 2 +,with soft boots bipedal/ LU A AVF pos. bruit  Outpatient HD (MWF [place])  4hrs 77.5kg 2Ca LUA AVF Heparin 5000  EPO 6200   Assess/Plan  1 MRSA sepsis- Vanc x 6 weeks+ ancef, echo neg  2 ESRD, cont hd MWF  3  Anemia - prbc x 1 on 6/21, aranesp 100 q Monday  4 Sec HPT, cont sensipar and binders P 4.6  5 BP/volume- cont midodrine, 3kg up, pedal edema, UF as tol  6 Malnutrition - Alb 1.9; renal diet; nepro bid, prostat - npo today for surger 7 Sacral decub- s/p I&D, wound VAC 7/2, foley to dec contamination   8 Cdiff +, cont po flagyl  9 Dispo/ deconditioning- HD in chair Fri, OK per primary/gen surg   Lenny Pastel, PA-C [facility] Beeper [phone] 02/10/2013,12:41 PM  LOS: 12 days   Patient seen and examined.  Agree with assessment and plan as above. [name]  MD Pager [phone]    Cell  [phone] 02/10/2013, 1:08 PM

## 2013-02-10 NOTE — Progress Notes (Signed)
Patient ID: [name], male   DOB: 02-05-1931, 79 y.o.   MRN: [id number] 2 Days Post-Op  Subjective: No complaints this am, denies sacral pain.  Objective: Vital signs in last 24 hours: Temp:  [97.4 F (36.3 C)-98.4 F (36.9 C)] 98.4 F (36.9 C) (07/03 0512) Pulse Rate:  [78-97] 87 (07/03 0512) Resp:  [16-20] 16 (07/02 2111) BP: (89-136)/(54-70) 135/68 mmHg (07/03 0512) SpO2:  [97 %-100 %] 98 % (07/03 0512) Weight:  [171 lb 11.8 oz (77.9 kg)-181 lb 7 oz (82.3 kg)] 181 lb 7 oz (82.3 kg) (07/02 2111) Last BM Date: 02/09/13  Intake/Output from previous day: 07/02 0701 - 07/03 0700 In: 884.2 [P.O.:300; I.V.:584.2] Out: 2850 [Urine:350] Intake/Output this shift:    Incision/Wound: vac in place, will eval wound when WOC comes to do dressing change later today  Lab Results:   Recent Labs  02/08/13 1643 02/09/13 0820  WBC  --  12.2*  HGB 10.5* 8.5*  HCT 31.0* 26.5*  PLT  --  487*   BMET  Recent Labs  02/08/13 1643 02/09/13 0820  NA 138 135  K 3.5 3.8  CL  --  96  CO2  --  28  GLUCOSE 87 105*  BUN  --  42*  CREATININE  --  4.99*  CALCIUM  --  8.7   Anti-infectives: Anti-infectives   Start     Dose/Rate Route Frequency Ordered Stop   02/07/13 1200  vancomycin (VANCOCIN) IVPB 1000 mg/200 mL premix     1,000 mg 200 mL/hr over 60 Minutes Intravenous Every M-W-F (Hemodialysis) 02/04/13 1616     02/04/13 1800  ceFAZolin (ANCEF) IVPB 2 g/50 mL premix     2 g 100 mL/hr over 30 Minutes Intravenous Every M-W-F (Hemodialysis) 02/02/13 1520 03/18/13 1159   02/03/13 1400  metroNIDAZOLE (FLAGYL) tablet 500 mg     500 mg Oral 3 times per day 02/03/13 1105     02/02/13 1530  metroNIDAZOLE (FLAGYL) tablet 500 mg  Status:  Discontinued     500 mg Oral 3 times per day 02/02/13 1520 02/02/13 1520   01/31/13 2200  vancomycin (VANCOCIN) 50 mg/mL oral solution 125 mg  Status:  Discontinued     125 mg Oral Daily 01/31/13 2116 02/03/13 1105   01/31/13 2000  ceFEPIme (MAXIPIME) 2 g  in dextrose 5 % 50 mL IVPB  Status:  Discontinued     2 g 100 mL/hr over 30 Minutes Intravenous Every M-W-F (2000) 01/29/13 0546 02/02/13 1032   01/31/13 1200  vancomycin (VANCOCIN) IVPB 750 mg/150 ml premix  Status:  Discontinued     750 mg 150 mL/hr over 60 Minutes Intravenous Every M-W-F (Hemodialysis) 01/29/13 0546 02/04/13 1616   01/29/13 0600  vancomycin (VANCOCIN) IVPB 750 mg/150 ml premix     750 mg 150 mL/hr over 60 Minutes Intravenous  Once 01/29/13 0545 01/29/13 0958   01/29/13 0600  ceFEPIme (MAXIPIME) 2 g in dextrose 5 % 50 mL IVPB     2 g 100 mL/hr over 30 Minutes Intravenous  Once 01/29/13 0546 01/29/13 0909   01/29/13 0045  vancomycin (VANCOCIN) IVPB 1000 mg/200 mL premix     1,000 mg 200 mL/hr over 60 Minutes Intravenous  Once 01/29/13 0033 01/29/13 0226   01/29/13 0045  piperacillin-tazobactam (ZOSYN) IVPB 2.25 g     2.25 g 100 mL/hr over 30 Minutes Intravenous  Once 01/29/13 0033 01/29/13 0333   01/29/13 0045  gentamicin (GARAMYCIN) IVPB 100 mg     100  mg 200 mL/hr over 30 Minutes Intravenous  Once 01/29/13 0043 01/29/13 0442      Assessment/Plan: Large sacral decub s/p bedside debridement  -s/p bedside debridement(02/03/13).  OR debridement 02/08/13   -wound vac change today by WOC -maximize nutrition, mobility.  -SCDs -on ancef and vanc per ID  Other medical problems managed by nephrology and internal medicine ESRD Anemia Secondary hyperparathyroidism Hypotension MRSA bacteremia/treating as presumed osteo per ID PCM   LOS: 12 days    [name]  02/10/2013 8:24 AM

## 2013-02-10 NOTE — Consult Note (Signed)
WOC consult Note Reason for Consult: VAC change with hydrotherapy. Pt to OR for debridement of sacral pressure ulcer 02/08/13.   Wound type: Stage IV pressure ulcer s/p debridement Pressure Ulcer POA: Yes Measurement:17cm x 11cm x 3cm  Wound bed:80% pink, moist, early granulation, 20% slough/fibrinous material Drainage (amount, consistency, odor) moderate serosanguinous  Periwound:intact Dressing procedure/placement/frequency:2pc of black granufoam used to fill wound bed, draped and skin protected for bridge to the right hip to prevent patient from lying on the [facility] pad.  Pt tolerated well, had hydrotherapy just prior to the [facility] dressing replacement. Will change frequency on Monday for hydro and VAC dressings to M/W/F.  WOC will follow along with you for [facility] dressing assistance as needed. Kristianna Saperstein Hayward RN,CWOCN [phone]

## 2013-02-10 NOTE — Progress Notes (Signed)
Patient [name]      DOB: 11/14/1930      HQI:[id number]  Patient with ESRD, on dialysis x 5 years, currently hospitalized for hypotension and MRSA bacteremia. Re-meet with patient/family regarding current Goals of Care today.Family in agreement to have Palliative Care services to follow at SNF-  Patients sacral wound surgically debrided 02/08/13, now with wound vac in place. Per nephrology note 7/3 needs to be cleared by surgery to be able to sit in recliner x 4 hours per day prior to discharge.  Palliative Care will sign off patients case, family wants to continue with HD and limited medical interventions at this point. No symptoms requiring management. Please re-consult if needs change.  Freddie Breech, CNS-C Palliative Medicine Team [facility] Health Team Phone: [phone] Pager: [phone]

## 2013-02-10 NOTE — Progress Notes (Signed)
Speech Language Pathology Dysphagia Treatment Patient Details Name: [name] MRN: [id number] DOB: 09-30-1930 Today's Date: 02/10/2013 Time: 4098-1191 SLP Time Calculation (min): 19 min  Assessment / Plan / Recommendation Clinical Impression  Pt seen for skilled dysphagia treatment to assess tolerance of po diet *regular/renal/thin.  Pt intake listed as 10 percent, he is afebrile with lungs decreased but clear.  Pt stated he was waiting for someone to come feed him but SLP encouraged pt to self feed for airway protection.  Pt able to self feed three ounces of grits, 2 ounces applesauce, 4 ounces coffee without indications of aspiration or stasis.  Did not test solids, as pt had cold eggs on tray.  Rec continue regular/thin diet encouraging pt to self feed- checking for oral stasis at end of meal.  SLP to sign off as pt is tolerated his po diet.  Pt states he would feed himself and verbalized importance for airway protection.  Please reconsult if desire.  Family not present but swallow precautions posted above bed for their education.      Diet Recommendation  Continue with Current Diet: Regular;Thin liquid    SLP Plan All goals met   Pertinent Vitals/Pain Afeb, decreased, clear   Swallowing Goals  SLP Swallowing Goals Patient will utilize recommended strategies during swallow to increase swallowing safety with: Minimal assistance Swallow Study Goal #2 - Progress: Met  General Temperature Spikes Noted: No Respiratory Status: Room air Behavior/Cognition: Alert;Cooperative;Pleasant mood Oral Cavity - Dentition: Adequate natural dentition Patient Positioning: Upright in bed  Oral Cavity - Oral Hygiene     Dysphagia Treatment Treatment focused on: Skilled observation of diet tolerance;Patient/family/caregiver education Family/Caregiver Educated: pt Treatment Methods/Modalities: Skilled observation Patient observed directly with PO's: Yes Type of PO's observed: Dysphagia 1  (puree);Thin liquids (grits, applesauce) Feeding: Needs set up Liquids provided via: Straw;Cup Type of cueing: Verbal Amount of cueing: Minimal   GO     Donavan Burnet, MS [facility] SLP [phone]

## 2013-02-11 LAB — RENAL FUNCTION PANEL
CO2: 27 mEq/L (ref 19–32)
Chloride: 98 mEq/L (ref 96–112)
GFR calc Af Amer: 12 mL/min — ABNORMAL LOW (ref >90)
GFR calc non Af Amer: 11 mL/min — ABNORMAL LOW (ref >90)
Potassium: 4 mEq/L (ref 3.5–5.1)
Sodium: 135 mEq/L (ref 135–145)

## 2013-02-11 LAB — CBC
MCV: 82.1 fL (ref 78.0–100.0)
Platelets: 452 10*3/uL — ABNORMAL HIGH (ref 150–400)
RBC: 3.36 MIL/uL — ABNORMAL LOW (ref 4.22–5.81)
WBC: 13.3 10*3/uL — ABNORMAL HIGH (ref 4.0–10.5)

## 2013-02-11 MED ORDER — MIDODRINE HCL 5 MG PO TABS
10.0000 mg | ORAL_TABLET | Freq: Three times a day (TID) | ORAL | Status: DC
  Administered 2013-02-12 – 2013-02-13 (×5): 10 mg via ORAL
  Filled 2013-02-11 (×8): qty 2

## 2013-02-11 MED ORDER — OXYCODONE HCL 5 MG PO TABS
ORAL_TABLET | ORAL | Status: AC
  Administered 2013-02-11: 5 mg via ORAL
  Filled 2013-02-11: qty 1

## 2013-02-11 NOTE — Progress Notes (Signed)
Subjective:  On HD in RECLINER mild discomfort after ;but tolerating Recliner hd so far Objective Vital signs in last 24 hours: Filed Vitals:   02/10/13 2243 02/11/13 0547 02/11/13 0941 02/11/13 0953  BP: 126/63 131/56 134/68 135/69  Pulse: 90 84 83 85  Temp: 99 F (37.2 C) 99 F (37.2 C) 97.7 F (36.5 C)   TempSrc: Oral Oral Oral   Resp: 18 20 20    Height:      Weight:      SpO2: 99% 96%     Weight change:   Intake/Output Summary (Last 24 hours) at 02/11/13 1005 Last data filed at 02/11/13 0700  Gross per 24 hour  Intake   1200 ml  Output     45 ml  Net   1155 ml   Labs: Basic Metabolic Panel:  Recent Labs Lab 02/06/13 0605 02/07/13 0550 02/08/13 1643 02/09/13 0820  NA 134* 132* 138 135  K 4.3 4.3 3.5 3.8  CL 96 95*  --  96  CO2 28 28  --  28  GLUCOSE 94 98 87 105*  BUN 47* 71*  --  42*  CREATININE 5.35* 6.33*  --  4.99*  CALCIUM 8.8 8.7  --  8.7  PHOS 4.6  --   --   --    Liver Function Tests:  Recent Labs Lab 02/06/13 0605  ALBUMIN 1.9*   No results found for this basename: LIPASE, AMYLASE,  in the last 168 hours No results found for this basename: AMMONIA,  in the last 168 hours CBC:  Recent Labs Lab 02/06/13 0605 02/07/13 0550 02/08/13 1643 02/09/13 0820  WBC 13.4* 13.2*  --  12.2*  HGB 8.9* 9.0* 10.5* 8.5*  HCT 27.9* 28.3* 31.0* 26.5*  MCV 84.0 82.7  --  81.3  PLT 473* 464*  --  487*   Cardiac Enzymes: No results found for this basename: CKTOTAL, CKMB, CKMBINDEX, TROPONINI,  in the last 168 hours CBG:  Recent Labs Lab 02/08/13 1746  GLUCAP 90    Iron Studies: No results found for this basename: IRON, TIBC, TRANSFERRIN, FERRITIN,  in the last 72 hours Studies/Results: No results found. Medications:   .  ceFAZolin (ANCEF) IV  2 g Intravenous Q M,W,F-HD  . cinacalcet  30 mg Oral BID WC  . darbepoetin (ARANESP) injection - DIALYSIS  100 mcg Intravenous Q Wed-HD  . feeding supplement (NEPRO CARB STEADY)  237 mL Oral BID BM   . feeding supplement  30 mL Oral TID WC  . gabapentin  100 mg Oral Daily  . lanthanum  500 mg Oral TID WC  . metroNIDAZOLE  500 mg Oral Q8H  . midodrine  10 mg Oral TID WC  . multivitamin  1 tablet Oral Daily  . omega-3 acid ethyl esters  1 g Oral Daily  . oxyCODONE      . sodium chloride  3 mL Intravenous Q12H  . vancomycin  1,000 mg Intravenous Q M,W,F-HD   Physical Exam:  General, alert, ON HD and NAD Heart: RRR  Lungs: CTA Bilaterally  Abdomen: BS positive, soft, nontender  Extremities: Dialysis Access: Bipedal edema 2 +,with soft boots bipedal/ LU A AVF patent on hd  Outpatient HD (MWF [place])  4hrs   77.5kg     2Ca     LUA AVF    Heparin 5000  EPO 6200   Assess/Plan  1 MRSA sepsis- with Sacral wound With woundvac= Vanc x 6 weeks+ ancef, with  echo  neg  2 ESRD, cont hd MWF /Attempting recliner hd now/ labs pending 3 Anemia - prbc x 1 on 6/21, aranesp 100 q Monday hd/cbc pending 4 Sec HPT, cont sensipar and binders P 4.6 last check 5 BP/volume- cont midodrine, NO AM WT!, pedal edema, UF as tol  6 Malnutrition - Alb 1.9; renal diet; nepro bid,   7 Sacral decub- s/p I&D, wound VAC 7/2, foley to dec contamination  8 Cdiff +, cont po flagyl  9 Dispo/ deconditioning- nh dc ?? today   Lenny Pastel, PA-C [facility] Beeper [phone] 02/11/2013,10:05 AM  LOS: 13 days   Patient seen and examined.  Agree with assessment and plan as above. To dialyze in chair today, if does OK could be dc'd to SNF.  Patrick Moselle  MD Pager [phone]    Cell  [phone] 02/11/2013, 12:34 PM

## 2013-02-11 NOTE — Clinical Social Work Placement (Addendum)
Clinical Social Work Department CLINICAL SOCIAL WORK PLACEMENT NOTE 02/11/2013  Patient:  AMANDEEP, NESMITH  Account Number:  [id number] Admit date:  01/29/2013  Clinical Social Worker:  Genelle Bal, LCSW  Date/time:  02/11/2013 04:54 AM  Clinical Social Work is seeking post-discharge placement for this patient at the following level of care:   SKILLED NURSING   (*CSW will update this form in Epic as items are completed)     Patient/family provided with [facility] Department of Clinical Social Work's list of facilities offering this level of care within the geographic area requested by the patient (or if unable, by the patient's family).  02/11/2013  Patient/family informed of their freedom to choose among providers that offer the needed level of care, that participate in Medicare, Medicaid or managed care program needed by the patient, have an available bed and are willing to accept the patient.    Patient/family informed of [facility]' ownership interest in [facility], as well as of the fact that they are under no obligation to receive care at this facility.  PASARR submitted to EDS on - patient has PASARR number PASARR number received from EDS on   FL2 transmitted to all facilities in geographic area requested by pt/family on  02/11/2013 except [facility] FL2 transmitted to all facilities within larger geographic area on   Patient informed that his/her managed care company has contracts with or will negotiate with  certain facilities, including the following:     Patient/family informed of bed offers received:   Patient chooses bed at [facility] Physician recommends and patient chooses bed at    Patient to be transferred to [facility] on 02/16/13   Patient to be transferred to facility by ambulance  The following physician request were entered in Epic:   Additional Comments:

## 2013-02-11 NOTE — Progress Notes (Signed)
Patient ID: [name]  male  ZOX:[id number]    DOB: [birth year]    DOA: 01/29/2013  PCP: Crawford Givens, MD  Assessment/Plan: Principal Problem: MRSA Sepsis / leukocytosis  - suspected source of entry is sacral decubitus, hypotension improved, fevers resolved, white count trending down. blood cx both + for MRSA (2/2), negative repeat cultures 6/25  - recs per ID:  TTE to evaluate for vegetations shows no none- no need for TEE as patient is going to be treated  - presumed osteomyelitis: he will need to vancomycin x 6 wks in addn to gram negative (ancef) and flagyl  - c.difficile infection- resolved- giving flagyl  - The patient is receiving antibiotics with hemodialysis, MWF and Flagyl.  Decubitus ulcer of sacral region, stage 4  - wound care consult performed - wounds are felt to be beyond conservative tx at this time - Palliative Care consult requested to establish GOC with family - plan to continue HTN medical care, general surgery consulted and bedside debridement done with good results- recommending hydrotherapy  - appreciate surgery input, postop day 3, wound VAC placed, change dressings MWF per WOC consult note  End stage renal disease  - cont dialysis  Anemia of chronic disease  - stable   Protein-calorie malnutrition, severe  - nutritional supplements  DM  CBG reasonably controlled at present time  Hx of HTN  Hx of CVA with suspected vascular dementia  Decubitus ulcer of R heel  Severe protein calorie malnutrition  DVT Prophylaxis:  Code Status:  Disposition: will try HD in recliner today if he can tolerate. Denied by his insurance for [facility] or [facility]. Unfortunately, his SNF can't manage wound vac. Will need new SNF search   Subjective: Seen this morning, only complaint that "TV is not working", explained the remote functioning and was happy.   Objective: Weight change:   Intake/Output Summary (Last 24 hours) at 02/11/13 1042 Last data filed at 02/11/13 0700  Gross per 24 hour  Intake   1200 ml  Output     45 ml  Net   1155 ml   Blood pressure 123/62, pulse 77, temperature 97.7 F (36.5 C), temperature source Oral, resp. rate 18, height 6\' 6"  (1.981 m), weight 82.3 kg (181 lb 7 oz), SpO2 96.00%.  Physical Exam: General: Ax O, NAD. CVS: S1-S2 clear, Chest: CTAB anteriorly Abdomen: soft NT, ND, NBS  Extremities: soft boots on the feet  Wound VAC +  Lab Results: Basic Metabolic Panel:  Recent Labs Lab 02/06/13 0605 02/07/13 0550 02/08/13 1643 02/09/13 0820  NA 134* 132* 138 135  K 4.3 4.3 3.5 3.8  CL 96 95*  --  96  CO2 28 28  --  28  GLUCOSE 94 98 87 105*  BUN 47* 71*  --  42*  CREATININE 5.35* 6.33*  --  4.99*  CALCIUM 8.8 8.7  --  8.7  PHOS 4.6  --   --   --    Liver Function Tests:  Recent Labs Lab 02/06/13 0605  ALBUMIN 1.9*   No results found for this basename: LIPASE, AMYLASE,  in the last 168 hours No results found for this basename: AMMONIA,  in the last 168 hours CBC:  Recent Labs Lab 02/09/13 0820 02/11/13 1024  WBC 12.2* 13.3*  HGB 8.5* 9.0*  HCT 26.5* 27.6*  MCV 81.3 82.1  PLT 487* 452*   Cardiac Enzymes: No results found for this basename: CKTOTAL, CKMB, CKMBINDEX, TROPONINI,  in the last 168 hours BNP:  No components found with this basename: POCBNP,  CBG:  Recent Labs Lab 02/08/13 1746  GLUCAP 90     Micro Results: Recent Results (from the past 240 hour(s))  CULTURE, BLOOD (ROUTINE X 2)     Status: None   Collection Time    02/02/13  3:30 PM      Result Value Range Status   Specimen Description BLOOD RIGHT ARM   Final   Special Requests BOTTLES DRAWN AEROBIC AND ANAEROBIC 10CC   Final   Culture  Setup Time 02/03/2013 01:15   Final   Culture NO GROWTH 5 DAYS   Final   Report Status 02/09/2013 FINAL   Final  CULTURE, BLOOD (ROUTINE X 2)     Status: None   Collection Time    02/02/13  3:40 PM      Result Value Range Status   Specimen Description BLOOD RIGHT HAND   Final    Special Requests BOTTLES DRAWN AEROBIC ONLY 10CC   Final   Culture  Setup Time 02/03/2013 01:15   Final   Culture NO GROWTH 5 DAYS   Final   Report Status 02/09/2013 FINAL   Final    Studies/Results: Dg Chest Port 1 View  01/29/2013   *RADIOLOGY REPORT*  Clinical Data: Sepsis, hypotension, unresponsive, history hypertension, diabetes, end-stage renal disease on dialysis  PORTABLE CHEST - 1 VIEW  Comparison: Portable exam 0055 hours compared to 12/27/2012  Findings: Upper normal heart size. Atherosclerotic calcification aorta. Stable mediastinal contours and pulmonary vascularity. Bronchitic changes with minimal right basilar atelectasis. No definite acute infiltrate, pleural effusion or pneumothorax. Bilateral chronic rotator cuff tears and AC joint degenerative changes. Scattered endplate spur formation thoracic spine.  IMPRESSION: Bronchitic changes with minimal right basilar atelectasis.   Original Report Authenticated By: Ulyses Southward, M.D.    Medications: Scheduled Meds: .  ceFAZolin (ANCEF) IV  2 g Intravenous Q M,W,F-HD  . cinacalcet  30 mg Oral BID WC  . darbepoetin (ARANESP) injection - DIALYSIS  100 mcg Intravenous Q Wed-HD  . feeding supplement (NEPRO CARB STEADY)  237 mL Oral BID BM  . feeding supplement  30 mL Oral TID WC  . gabapentin  100 mg Oral Daily  . lanthanum  500 mg Oral TID WC  . metroNIDAZOLE  500 mg Oral Q8H  . midodrine  10 mg Oral TID WC  . multivitamin  1 tablet Oral Daily  . omega-3 acid ethyl esters  1 g Oral Daily  . sodium chloride  3 mL Intravenous Q12H  . vancomycin  1,000 mg Intravenous Q M,W,F-HD      LOS: 13 days   RAI,RIPUDEEP M.D. [facility] 02/11/2013, 10:42 AM Pager: [phone]  If 7PM-7AM, please contact night-coverage [email or web address] Password [email or web address]

## 2013-02-11 NOTE — Procedures (Signed)
I was present at this dialysis session. I have reviewed the session itself and made appropriate changes.   Rob Zhanna Melin  MD Pager [phone]    Cell  [phone] 02/11/2013, 12:56 PM    

## 2013-02-11 NOTE — Clinical Social Work Psychosocial (Signed)
Clinical Social Work Department BRIEF PSYCHOSOCIAL ASSESSMENT 02/11/2013  Patient:  [name], [name]     Account Number:  [id number]     Admit date:  01/29/2013  Clinical Social Worker:  Delmer Islam  Date/Time:  02/11/2013 04:40 AM  Referred by:  Physician  Date Referred:  02/08/2013 Referred for  SNF Placement   Other Referral:   Interview type:  Family Other interview type:   [name], [name] - [phone]    PSYCHOSOCIAL DATA Living Status:  WIFE Admitted from facility:   Level of care:   Primary support name:  Thornell Sartorius Primary support relationship to patient:  SPOUSE Degree of support available:   family - Wife, son [name] and daughter [name] very involved and concerned about patient's wellbeing.    CURRENT CONCERNS Current Concerns  Post-Acute Placement   Other Concerns:    SOCIAL WORK ASSESSMENT / PLAN CSW advised by MD that patient will need to dishcarge back to facility with wound vac. Patient is from [facility] and call made to admissions director Bjorn Loser. CSW advised that they do not accept patients with wound vacs or requiring hydrotherapy.    CSW contacted son [name] and discussed situation. He acknowledged that patient still needs skilled care and igave consent for CSW to fax out to facilities in [facility] to find one that accepts wound vacs.   Assessment/plan status:  Psychosocial Support/Ongoing Assessment of Needs Other assessment/ plan:   Information/referral to community resources:    PATIENT'S/FAMILY'S RESPONSE TO PLAN OF CARE: Son in agreement with searching for another facility.

## 2013-02-11 NOTE — Progress Notes (Signed)
ANTIBIOTIC CONSULT NOTE - FOLLOW UP  Pharmacy Consult for vancomycin Indication: MRSA bacteremia, worsening sacral decub ulcers/presumed osteomyelitis   No Known Allergies  Patient Measurements: Height: 6\' 6"  (198.1 cm) Weight: 181 lb 7 oz (82.3 kg) IBW/kg (Calculated) : 91.4   Vital Signs: Temp: 99 F (37.2 C) (07/04 0547) Temp src: Oral (07/04 0547) BP: 131/56 mmHg (07/04 0547) Pulse Rate: 84 (07/04 0547) Intake/Output from previous day: 07/03 0701 - 07/04 0700 In: 1400 [P.O.:800; I.V.:600] Out: 220 [Urine:220] Intake/Output from this shift:    Labs:  Recent Labs  02/08/13 1643 02/09/13 0820  WBC  --  12.2*  HGB 10.5* 8.5*  PLT  --  487*  CREATININE  --  4.99*   Estimated Creatinine Clearance: 13.7 ml/min (by C-G formula based on Cr of 4.99). No results found for this basename: VANCOTROUGH, VANCOPEAK, VANCORANDOM, GENTTROUGH, GENTPEAK, GENTRANDOM, TOBRATROUGH, TOBRAPEAK, TOBRARND, AMIKACINPEAK, AMIKACINTROU, AMIKACIN,  in the last 72 hours   Microbiology: Recent Results (from the past 720 hour(s))  CULTURE, BLOOD (ROUTINE X 2)     Status: None   Collection Time    01/29/13 12:30 AM      Result Value Range Status   Specimen Description BLOOD RIGHT ARM   Final   Special Requests BOTTLES DRAWN AEROBIC AND ANAEROBIC 10CC EACH   Final   Culture  Setup Time 01/29/2013 15:10   Final   Culture     Final   Value: METHICILLIN RESISTANT STAPHYLOCOCCUS AUREUS     Note: RIFAMPIN AND GENTAMICIN SHOULD NOT BE USED AS SINGLE DRUGS FOR TREATMENT OF STAPH INFECTIONS. CRITICAL RESULT CALLED TO, READ BACK BY AND VERIFIED WITH: ANNA AGUILAR 01/31/13 1440 BY SMITHERSJ     Note: Gram Stain Report Called to,Read Back By and Verified With: BERNADETTE LOMCALLO 01/30/13 @ 1:14PM BY RUSCA.   Report Status 02/01/2013 FINAL   Final   Organism ID, Bacteria METHICILLIN RESISTANT STAPHYLOCOCCUS AUREUS   Final  CULTURE, BLOOD (ROUTINE X 2)     Status: None   Collection Time    01/29/13 12:45  AM      Result Value Range Status   Specimen Description BLOOD LEFT HAND   Final   Special Requests BOTTLES DRAWN AEROBIC ONLY 10CC   Final   Culture  Setup Time 01/29/2013 15:11   Final   Culture     Final   Value: STAPHYLOCOCCUS AUREUS     Note: SUSCEPTIBILITIES PERFORMED ON PREVIOUS CULTURE WITHIN THE LAST 5 DAYS.     Note: Gram Stain Report Called to,Read Back By and Verified With: BERNADETTE [facility] 01/30/13 @ 1:14PM BY RUSCA.   Report Status 02/01/2013 FINAL   Final  URINE CULTURE     Status: None   Collection Time    01/29/13  1:56 AM      Result Value Range Status   Specimen Description URINE, RANDOM   Final   Special Requests NONE   Final   Culture  Setup Time 01/29/2013 15:24   Final   Colony Count NO GROWTH   Final   Culture NO GROWTH   Final   Report Status 01/30/2013 FINAL   Final  MRSA PCR SCREENING     Status: None   Collection Time    01/29/13  5:30 AM      Result Value Range Status   MRSA by PCR NEGATIVE  NEGATIVE Final   Comment:            The GeneXpert MRSA Assay (FDA  approved for NASAL specimens     only), is one component of a     comprehensive MRSA colonization     surveillance program. It is not     intended to diagnose MRSA     infection nor to guide or     monitor treatment for     MRSA infections.  CULTURE, BLOOD (ROUTINE X 2)     Status: None   Collection Time    02/02/13  3:30 PM      Result Value Range Status   Specimen Description BLOOD RIGHT ARM   Final   Special Requests BOTTLES DRAWN AEROBIC AND ANAEROBIC 10CC   Final   Culture  Setup Time 02/03/2013 01:15   Final   Culture NO GROWTH 5 DAYS   Final   Report Status 02/09/2013 FINAL   Final  CULTURE, BLOOD (ROUTINE X 2)     Status: None   Collection Time    02/02/13  3:40 PM      Result Value Range Status   Specimen Description BLOOD RIGHT HAND   Final   Special Requests BOTTLES DRAWN AEROBIC ONLY 10CC   Final   Culture  Setup Time 02/03/2013 01:15   Final   Culture NO GROWTH 5  DAYS   Final   Report Status 02/09/2013 FINAL   Final    Anti-infectives   Start     Dose/Rate Route Frequency Ordered Stop   02/07/13 1200  vancomycin (VANCOCIN) IVPB 1000 mg/200 mL premix     1,000 mg 200 mL/hr over 60 Minutes Intravenous Every M-W-F (Hemodialysis) 02/04/13 1616     02/04/13 1800  ceFAZolin (ANCEF) IVPB 2 g/50 mL premix     2 g 100 mL/hr over 30 Minutes Intravenous Every M-W-F (Hemodialysis) 02/02/13 1520 03/18/13 1159   02/03/13 1400  metroNIDAZOLE (FLAGYL) tablet 500 mg     500 mg Oral 3 times per day 02/03/13 1105     02/02/13 1530  metroNIDAZOLE (FLAGYL) tablet 500 mg  Status:  Discontinued     500 mg Oral 3 times per day 02/02/13 1520 02/02/13 1520   01/31/13 2200  vancomycin (VANCOCIN) 50 mg/mL oral solution 125 mg  Status:  Discontinued     125 mg Oral Daily 01/31/13 2116 02/03/13 1105   01/31/13 2000  ceFEPIme (MAXIPIME) 2 g in dextrose 5 % 50 mL IVPB  Status:  Discontinued     2 g 100 mL/hr over 30 Minutes Intravenous Every M-W-F (2000) 01/29/13 0546 02/02/13 1032   01/31/13 1200  vancomycin (VANCOCIN) IVPB 750 mg/150 ml premix  Status:  Discontinued     750 mg 150 mL/hr over 60 Minutes Intravenous Every M-W-F (Hemodialysis) 01/29/13 0546 02/04/13 1616   01/29/13 0600  vancomycin (VANCOCIN) IVPB 750 mg/150 ml premix     750 mg 150 mL/hr over 60 Minutes Intravenous  Once 01/29/13 0545 01/29/13 0958   01/29/13 0600  ceFEPIme (MAXIPIME) 2 g in dextrose 5 % 50 mL IVPB     2 g 100 mL/hr over 30 Minutes Intravenous  Once 01/29/13 0546 01/29/13 0909   01/29/13 0045  vancomycin (VANCOCIN) IVPB 1000 mg/200 mL premix     1,000 mg 200 mL/hr over 60 Minutes Intravenous  Once 01/29/13 0033 01/29/13 0226   01/29/13 0045  piperacillin-tazobactam (ZOSYN) IVPB 2.25 g     2.25 g 100 mL/hr over 30 Minutes Intravenous  Once 01/29/13 0033 01/29/13 0333   01/29/13 0045  gentamicin (GARAMYCIN) IVPB 100 mg  100 mg 200 mL/hr over 30 Minutes Intravenous  Once 01/29/13 0043  01/29/13 0442      Assessment: Patient is an 79 y.o M on vancomycin and ancef for MRSA bacteremia and worsening sacral decub ulcers/presumed osteomyelitis with plan to treat for 6 weeks with abx. Last vancomycin level drawn on 6/27 was slightly below goal at 13.2 with vancomycin dose increased to 1gm qHD (started on 6/30).  Patient has only received 2 doses of the new vancomycin regimen. Will wait until early next to make sure level is at steady state before checking rechecking level.   Goal of Therapy:  Pre-HD vancomycin level= 15-25  Plan:  1) Will check pre-HD vancomycin  Level on Monday 7/07  Ladarion Munyon P 02/11/2013,8:50 AM

## 2013-02-12 NOTE — Progress Notes (Signed)
Subjective:  No cos, eating brk/ tol HD yest in recliner Objective Vital signs in last 24 hours: Filed Vitals:   02/11/13 1612 02/11/13 1800 02/11/13 2102 02/12/13 0500  BP: 139/75 124/87 128/65 122/66  Pulse: 87 84 86 97  Temp: 98.2 F (36.8 C) 97.1 F (36.2 C) 99.1 F (37.3 C) 98.7 F (37.1 C)  TempSrc: Oral Oral Oral Oral  Resp: 18 20 20 20   Height:   6\' 6"  (1.981 m)   Weight:   82.3 kg (181 lb 7 oz)   SpO2: 100% 96% 99% 99%   Weight change:   Intake/Output Summary (Last 24 hours) at 02/12/13 0819 Last data filed at 02/12/13 0500  Gross per 24 hour  Intake    480 ml  Output   3200 ml  Net  -2720 ml   Labs: Basic Metabolic Panel:  Recent Labs Lab 02/06/13 0605 02/07/13 0550 02/08/13 1643 02/09/13 0820 02/11/13 1024  NA 134* 132* 138 135 135  K 4.3 4.3 3.5 3.8 4.0  CL 96 95*  --  96 98  CO2 28 28  --  28 27  GLUCOSE 94 98 87 105* 125*  BUN 47* 71*  --  42* 38*  CREATININE 5.35* 6.33*  --  4.99* 4.65*  CALCIUM 8.8 8.7  --  8.7 8.7  PHOS 4.6  --   --   --  3.2   Liver Function Tests:  Recent Labs Lab 02/06/13 0605 02/11/13 1024  ALBUMIN 1.9* 2.0*   No results found for this basename: LIPASE, AMYLASE,  in the last 168 hours No results found for this basename: AMMONIA,  in the last 168 hours CBC:  Recent Labs Lab 02/06/13 0605 02/07/13 0550 02/08/13 1643 02/09/13 0820 02/11/13 1024  WBC 13.4* 13.2*  --  12.2* 13.3*  HGB 8.9* 9.0* 10.5* 8.5* 9.0*  HCT 27.9* 28.3* 31.0* 26.5* 27.6*  MCV 84.0 82.7  --  81.3 82.1  PLT 473* 464*  --  487* 452*   Cardiac Enzymes: No results found for this basename: CKTOTAL, CKMB, CKMBINDEX, TROPONINI,  in the last 168 hours CBG:  Recent Labs Lab 02/08/13 1746  GLUCAP 90    Physical Exam: General: alert, pleasant bm, nad Heart: RRR  Lungs: CTA Bilaterally  Abdomen: BS positive, soft, nontender  Extremities: Dialysis Access: Bipedal edema 2 + and improving ,with soft boots bipedal/ LU A AVF pos.  [facility]  Outpatient HD (MWF [facility])  4hrs 77.5kg 2Ca LUA AVF Heparin 5000  EPO 6200   Assess/Plan  1 MRSA sepsis / sacral wound w Vac= Vanc x 6 weeks+ ancef, with echo neg  2 ESRD, cont hd MWF /HD with recliner hd (as in OP center) 3 Anemia - prbc x 1 on 6/21, aranesp 100 q Monday hd/hgb 9.0<8.5 4 Sec HPT, cont sensipar and binders Phos and ca stable 5 BP/volume- cont midodrine as needed yesterday did not need sbp >130 on hd/ NO AM WT!// 5 l > edw BUT BED WT, 2 l uf yesterday with  pedal edema improved with UF on hd 6 Malnutrition - Alb 1.9>2.0 renal diet; nepro bid,  7 Sacral decub- s/p I&D, wound VAC 7/2, foley to dec contamination  8 Cdiff +, cont po flagyl  9 Dispo/ deconditioning- nh dc when nh found that takes vac   Lenny Pastel, PA-C [facility] Beeper [phone] 02/12/2013,8:19 AM  LOS: 14 days   Patient seen and examined.  Agree with assessment and plan as above. Rob Tax adviser  MD Pager [phone]    Cell  [phone] 02/12/2013, 2:46 PM

## 2013-02-12 NOTE — Progress Notes (Signed)
PT/Hydrotherapy Cancellation Note  Patient Details Name: [name] MRN: [id number] DOB: Aug 03, 1931   Cancelled Treatment:    Reason Eval/Treat Not Completed: Other (comment). Per nursing [facility] was changed yesterday.  Plan was for [facility] to be changed today with hydrotherapy.  Since [facility] changed yesterday will defer hydrotherapy until Monday with VAC change.   [name] 02/12/2013, 8:17 AM  Skip Mayer PT [phone]

## 2013-02-12 NOTE — Progress Notes (Signed)
Patient ID: [name]  male  OZH:[id number]    DOB: [birth year]    DOA: 01/29/2013  PCP: Crawford Givens, MD  Assessment/Plan: Principal Problem: MRSA Sepsis / leukocytosis  - suspected source of entry is sacral decubitus, hypotension improved, fevers resolved, white count trending down. blood cx both + for MRSA (2/2), negative repeat cultures 6/25  - recs per ID:  TTE to evaluate for vegetations shows no none- no need for TEE as patient is going to be treated  - presumed osteomyelitis: he will need to vancomycin x 6 wks in addn to gram negative (ancef) and flagyl  - c.difficile infection- resolved- giving flagyl  - The patient is receiving antibiotics with hemodialysis, MWF and Flagyl.  Decubitus ulcer of sacral region, stage 4  - wound care consult performed - wounds are felt to be beyond conservative tx at this time - Palliative Care consult requested to establish GOC with family - plan to continue HTN medical care, general surgery consulted and bedside debridement done with good results- recommending hydrotherapy  - appreciate surgery input, postop day 4, wound VAC placed, change dressings MWF per WOC consult note  End stage renal disease  - cont dialysis  Anemia of chronic disease  - stable   Protein-calorie malnutrition, severe  - nutritional supplements  DM  CBG reasonably controlled at present time  Hx of HTN  Hx of CVA with suspected vascular dementia  Decubitus ulcer of R heel  Severe protein calorie malnutrition  DVT Prophylaxis:  Code Status:  Disposition:  need new SNF search   Subjective: Seen this morning, no complaints  Objective: Weight change:   Intake/Output Summary (Last 24 hours) at 02/12/13 0949 Last data filed at 02/12/13 0900  Gross per 24 hour  Intake    580 ml  Output   3200 ml  Net  -2620 ml   Blood pressure 137/74, pulse 90, temperature 98.1 F (36.7 C), temperature source Oral, resp. rate 18, height 6\' 6"  (1.981 m), weight 82.3 kg (181  lb 7 oz), SpO2 96.00%.  Physical Exam: General: Ax O, NAD. CVS: S1-S2 clear, Chest: CTAB anteriorly Abdomen: soft NT, ND, NBS  Extremities: soft boots on the feet  Wound VAC +  Lab Results: Basic Metabolic Panel:  Recent Labs Lab 02/09/13 0820 02/11/13 1024  NA 135 135  K 3.8 4.0  CL 96 98  CO2 28 27  GLUCOSE 105* 125*  BUN 42* 38*  CREATININE 4.99* 4.65*  CALCIUM 8.7 8.7  PHOS  --  3.2   Liver Function Tests:  Recent Labs Lab 02/06/13 0605 02/11/13 1024  ALBUMIN 1.9* 2.0*   No results found for this basename: LIPASE, AMYLASE,  in the last 168 hours No results found for this basename: AMMONIA,  in the last 168 hours CBC:  Recent Labs Lab 02/09/13 0820 02/11/13 1024  WBC 12.2* 13.3*  HGB 8.5* 9.0*  HCT 26.5* 27.6*  MCV 81.3 82.1  PLT 487* 452*   Cardiac Enzymes: No results found for this basename: CKTOTAL, CKMB, CKMBINDEX, TROPONINI,  in the last 168 hours BNP: No components found with this basename: POCBNP,  CBG:  Recent Labs Lab 02/08/13 1746  GLUCAP 90     Micro Results: Recent Results (from the past 240 hour(s))  CULTURE, BLOOD (ROUTINE X 2)     Status: None   Collection Time    02/02/13  3:30 PM      Result Value Range Status   Specimen Description BLOOD RIGHT ARM  Final   Special Requests BOTTLES DRAWN AEROBIC AND ANAEROBIC 10CC   Final   Culture  Setup Time 02/03/2013 01:15   Final   Culture NO GROWTH 5 DAYS   Final   Report Status 02/09/2013 FINAL   Final  CULTURE, BLOOD (ROUTINE X 2)     Status: None   Collection Time    02/02/13  3:40 PM      Result Value Range Status   Specimen Description BLOOD RIGHT HAND   Final   Special Requests BOTTLES DRAWN AEROBIC ONLY 10CC   Final   Culture  Setup Time 02/03/2013 01:15   Final   Culture NO GROWTH 5 DAYS   Final   Report Status 02/09/2013 FINAL   Final    Studies/Results: Dg Chest Port 1 View  01/29/2013   *RADIOLOGY REPORT*  Clinical Data: Sepsis, hypotension, unresponsive,  history hypertension, diabetes, end-stage renal disease on dialysis  PORTABLE CHEST - 1 VIEW  Comparison: Portable exam 0055 hours compared to 12/27/2012  Findings: Upper normal heart size. Atherosclerotic calcification aorta. Stable mediastinal contours and pulmonary vascularity. Bronchitic changes with minimal right basilar atelectasis. No definite acute infiltrate, pleural effusion or pneumothorax. Bilateral chronic rotator cuff tears and AC joint degenerative changes. Scattered endplate spur formation thoracic spine.  IMPRESSION: Bronchitic changes with minimal right basilar atelectasis.   Original Report Authenticated By: Ulyses Southward, M.D.    Medications: Scheduled Meds: .  ceFAZolin (ANCEF) IV  2 g Intravenous Q M,W,F-HD  . cinacalcet  30 mg Oral BID WC  . darbepoetin (ARANESP) injection - DIALYSIS  100 mcg Intravenous Q Wed-HD  . feeding supplement (NEPRO CARB STEADY)  237 mL Oral BID BM  . feeding supplement  30 mL Oral TID WC  . gabapentin  100 mg Oral Daily  . lanthanum  500 mg Oral TID WC  . metroNIDAZOLE  500 mg Oral Q8H  . midodrine  10 mg Oral TID WC  . multivitamin  1 tablet Oral Daily  . omega-3 acid ethyl esters  1 g Oral Daily  . sodium chloride  3 mL Intravenous Q12H  . vancomycin  1,000 mg Intravenous Q M,W,F-HD      LOS: 14 days   RAI,RIPUDEEP M.D. [facility] 02/12/2013, 9:49 AM Pager: [phone]  If 7PM-7AM, please contact night-coverage [email or web address] Password [email or web address]

## 2013-02-13 NOTE — Progress Notes (Signed)
Subjective:  Eating brk, no cos Objective Vital signs in last 24 hours: Filed Vitals:   02/12/13 0936 02/12/13 1830 02/12/13 2057 02/13/13 0433  BP: 137/74 142/79 151/75 134/71  Pulse: 90 83 83 79  Temp: 98.1 F (36.7 C) 98.6 F (37 C) 99 F (37.2 C) 98.8 F (37.1 C)  TempSrc: Oral Oral Oral Oral  Resp: 18 20 20 20   Height:   6\' 6"  (1.981 m)   Weight:   82.3 kg (181 lb 7 oz)   SpO2: 96% 97% 99% 100%   Weight change: 0 kg (0 lb)  Intake/Output Summary (Last 24 hours) at 02/13/13 0832 Last data filed at 02/13/13 7829  Gross per 24 hour  Intake 1762.25 ml  Output    500 ml  Net 1262.25 ml   Labs: Basic Metabolic Panel:  Recent Labs Lab 02/07/13 0550 02/08/13 1643 02/09/13 0820 02/11/13 1024  NA 132* 138 135 135  K 4.3 3.5 3.8 4.0  CL 95*  --  96 98  CO2 28  --  28 27  GLUCOSE 98 87 105* 125*  BUN 71*  --  42* 38*  CREATININE 6.33*  --  4.99* 4.65*  CALCIUM 8.7  --  8.7 8.7  PHOS  --   --   --  3.2   Liver Function Tests:  Recent Labs Lab 02/11/13 1024  ALBUMIN 2.0*   No results found for this basename: LIPASE, AMYLASE,  in the last 168 hours No results found for this basename: AMMONIA,  in the last 168 hours CBC:  Recent Labs Lab 02/07/13 0550 02/08/13 1643 02/09/13 0820 02/11/13 1024  WBC 13.2*  --  12.2* 13.3*  HGB 9.0* 10.5* 8.5* 9.0*  HCT 28.3* 31.0* 26.5* 27.6*  MCV 82.7  --  81.3 82.1  PLT 464*  --  487* 452*   Cardiac Enzymes: No results found for this basename: CKTOTAL, CKMB, CKMBINDEX, TROPONINI,  in the last 168 hours CBG:  Recent Labs Lab 02/08/13 1746  GLUCAP 90    Iron Studies: No results found for this basename: IRON, TIBC, TRANSFERRIN, FERRITIN,  in the last 72 hours Studies/Results: No results found. Medications:   .  ceFAZolin (ANCEF) IV  2 g Intravenous Q M,W,F-HD  . cinacalcet  30 mg Oral BID WC  . darbepoetin (ARANESP) injection - DIALYSIS  100 mcg Intravenous Q Wed-HD  . feeding supplement (NEPRO CARB STEADY)   237 mL Oral BID BM  . feeding supplement  30 mL Oral TID WC  . gabapentin  100 mg Oral Daily  . lanthanum  500 mg Oral TID WC  . metroNIDAZOLE  500 mg Oral Q8H  . midodrine  10 mg Oral TID WC  . multivitamin  1 tablet Oral Daily  . omega-3 acid ethyl esters  1 g Oral Daily  . sodium chloride  3 mL Intravenous Q12H  . vancomycin  1,000 mg Intravenous Q M,W,F-HD   Physical Exam:  General: alert, pleasant bm, nad , appropriate Heart: RRR , no rub or mur Lungs: CTA Bilaterally  Abdomen: BS positive, soft, nontender  Extremities: Dialysis Access: Bipedal edema 1 + and improving ,with soft boots bipedal/ LU A AVF pos. Bruit   Outpatient HD (MWF [facility])  4hrs   77.5kg     2Ca     LUA AVF     Heparin 5000  EPO 6200   Assess/Plan  1 MRSA sepsis / sacral wound w Vac= Vanc x 6 weeks+ ancef, with echo  neg  2 ESRD, cont hd MWF /HD with recliner hd (as in OP center)  3 Anemia - prbc x 1 on 6/21, aranesp 100 q Monday hd/hgb 9.0<8.5  4 Sec HPT, cont sensipar and binders Phos and ca stable  5 BP/volume- +vol excess, 5kg up w dependent edema, BP up > d/c midodrine for now and max UF next HD 6 Malnutrition - Alb 1.9>2.0 renal diet; nepro bid,  7 Sacral decub- s/p I&D, wound VAC 7/2, foley to dec contamination  8 Cdiff +, cont po flagyl  9 Dispo/ deconditioning- nh dc when nh found that takes vac  Lenny Pastel, PA-C [facility] Bee[facility] [phone] 02/13/2013,8:32 AM  LOS: 15 days   Patient seen and examined.  I agree with plan as above with additions as indicated. [name]  MD Pager [phone]    Cell  [phone] 02/13/2013, 3:12 PM

## 2013-02-13 NOTE — Progress Notes (Signed)
Patient ID: [name]  male  ZOX:[id number]    DOB: April 02, 1931    DOA: 01/29/2013  PCP: Crawford Givens, MD  Interim summary/ hospital course so far:  79 y.o. male history of ESRD on hemodialysis who was recently admitted (5/19 - 01/10/2013) for sepsis secondary to C. difficile colitis was brought back to the ER from nursing home 6/21 after patient was found to be hypotensive. On arrival patient also was found to be febrile, tachycardic, and hemoglobin low from his baseline. Patient was started on empiric vancomycin and cefepime after the blood cultures were obtained, given fluid bolus and had RBC transfusion  for hypotension in ED. He was admitted to SDU. Patient's stage 4 sacral decubitus had a discharge on exam at presentation.  Patient's family had a detailed discussion with ER physician Dr. Arnoldo Morale and they requested to continue with DO NOT RESUSCITATE status and no vasopressors. They discussed the possibility of moving towards comfort measures. Wound care and Infectious disease were consulted, blood cultures revealed MRSA bacteremia Palliative Care consult was requested to establish GOC with family and per meting, continue medical care. Patient was not able to tolerate hydrotherapy due to severe pain and the family wanted aggressive care with his wound, hence general surgery was also consulted. Patient underwent operative debridement in OR on 02/08/13 and wound vac was placed.  [facility] by [facility] and [facility] SNF at [facility] were not able to handle wound vac. SW has started  search for new SNF. Renal has been following for ESRD, on HD MWF.     Consults; Renal- Dr Arta Silence Surgery- Dr Corliss Skains Palliative medicine- signed off Wound Care   Assessment/Plan: Principal Problem: MRSA Sepsis / leukocytosis  - suspected source of entry is sacral decubitus stage IV with osteomyelitis, hypotension improved, fevers resolved. - blood cx both + for MRSA (2/2), negative repeat cultures 6/25  - recs per  ID:  TTE to evaluate for vegetations shows none- no need for TEE as patient is going to be treated  - presumed osteomyelitis: he will need to vancomycin x 6 wks in addition to gram negative (ancef) and flagyl  - c.difficile infection- resolved- giving flagyl  - The patient is receiving antibiotics with hemodialysis, MWF and Flagyl.  Decubitus ulcer of sacral region, stage 4  - wound care consult performed - wounds are felt to be beyond conservative tx at this time - Palliative Care consult requested to establish GOC with family - plan to continue medical care, general surgery was consulted and bedside debridement done with good results- recommending hydrotherapy  - appreciate surgery input, postop day 5, wound VAC placed, change dressings MWF per WOC consult note  End stage renal disease  - cont dialysis  Anemia of chronic disease  - stable   Protein-calorie malnutrition, severe  - nutritional supplements  DM  CBG reasonably controlled at present time  Hx of HTN  Hx of CVA with suspected vascular dementia  Decubitus ulcer of R heel  Severe protein calorie malnutrition  DVT Prophylaxis:  Code Status:  Disposition:  needs new SNF search   Subjective: Seen this morning, no complaints at all, watching TV  Objective: Weight change: 0 kg (0 lb)  Intake/Output Summary (Last 24 hours) at 02/13/13 0941 Last data filed at 02/13/13 8119  Gross per 24 hour  Intake 1662.25 ml  Output    500 ml  Net 1162.25 ml   Blood pressure 134/71, pulse 79, temperature 98.8 F (37.1 C), temperature source Oral, resp. rate  20, height 6\' 6"  (1.981 m), weight 82.3 kg (181 lb 7 oz), SpO2 100.00%.  Physical Exam: General: Ax O, NAD. CVS: S1-S2 clear, Chest: CTAB anteriorly Abdomen: soft NT, ND, NBS  Extremities: soft boots on the feet  Wound VAC +  Lab Results: Basic Metabolic Panel:  Recent Labs Lab 02/09/13 0820 02/11/13 1024  NA 135 135  K 3.8 4.0  CL 96 98  CO2 28 27  GLUCOSE  105* 125*  BUN 42* 38*  CREATININE 4.99* 4.65*  CALCIUM 8.7 8.7  PHOS  --  3.2   Liver Function Tests:  Recent Labs Lab 02/11/13 1024  ALBUMIN 2.0*   No results found for this basename: LIPASE, AMYLASE,  in the last 168 hours No results found for this basename: AMMONIA,  in the last 168 hours CBC:  Recent Labs Lab 02/09/13 0820 02/11/13 1024  WBC 12.2* 13.3*  HGB 8.5* 9.0*  HCT 26.5* 27.6*  MCV 81.3 82.1  PLT 487* 452*   Cardiac Enzymes: No results found for this basename: CKTOTAL, CKMB, CKMBINDEX, TROPONINI,  in the last 168 hours BNP: No components found with this basename: POCBNP,  CBG:  Recent Labs Lab 02/08/13 1746  GLUCAP 90     Micro Results: No results found for this or any previous visit (from the past 240 hour(s)).  Studies/Results: Dg Chest Port 1 View  01/29/2013   *RADIOLOGY REPORT*  Clinical Data: Sepsis, hypotension, unresponsive, history hypertension, diabetes, end-stage renal disease on dialysis  PORTABLE CHEST - 1 VIEW  Comparison: Portable exam 0055 hours compared to 12/27/2012  Findings: Upper normal heart size. Atherosclerotic calcification aorta. Stable mediastinal contours and pulmonary vascularity. Bronchitic changes with minimal right basilar atelectasis. No definite acute infiltrate, pleural effusion or pneumothorax. Bilateral chronic rotator cuff tears and AC joint degenerative changes. Scattered endplate spur formation thoracic spine.  IMPRESSION: Bronchitic changes with minimal right basilar atelectasis.   Original Report Authenticated By: Ulyses Southward, M.D.    Medications: Scheduled Meds: .  ceFAZolin (ANCEF) IV  2 g Intravenous Q M,W,F-HD  . cinacalcet  30 mg Oral BID WC  . darbepoetin (ARANESP) injection - DIALYSIS  100 mcg Intravenous Q Wed-HD  . feeding supplement (NEPRO CARB STEADY)  237 mL Oral BID BM  . feeding supplement  30 mL Oral TID WC  . gabapentin  100 mg Oral Daily  . lanthanum  500 mg Oral TID WC  . metroNIDAZOLE   500 mg Oral Q8H  . midodrine  10 mg Oral TID WC  . multivitamin  1 tablet Oral Daily  . omega-3 acid ethyl esters  1 g Oral Daily  . sodium chloride  3 mL Intravenous Q12H  . vancomycin  1,000 mg Intravenous Q M,W,F-HD      LOS: 15 days   Scottlyn Mchaney M.D. [facility] 02/13/2013, 9:41 AM Pager: [phone]  If 7PM-7AM, please contact night-coverage [email or web address] Password [email or web address]

## 2013-02-14 LAB — RENAL FUNCTION PANEL
Albumin: 2.2 g/dL — ABNORMAL LOW (ref 3.5–5.2)
CO2: 25 mEq/L (ref 19–32)
Calcium: 9.3 mg/dL (ref 8.4–10.5)
Chloride: 100 mEq/L (ref 96–112)
GFR calc Af Amer: 10 mL/min — ABNORMAL LOW (ref >90)
GFR calc non Af Amer: 9 mL/min — ABNORMAL LOW (ref >90)
Sodium: 137 mEq/L (ref 135–145)

## 2013-02-14 LAB — CBC
Platelets: 547 10*3/uL — ABNORMAL HIGH (ref 150–400)
RBC: 3.57 MIL/uL — ABNORMAL LOW (ref 4.22–5.81)
WBC: 13 10*3/uL — ABNORMAL HIGH (ref 4.0–10.5)

## 2013-02-14 LAB — VANCOMYCIN, RANDOM: Vancomycin Rm: 20.3 ug/mL

## 2013-02-14 NOTE — Progress Notes (Signed)
Physical Therapy Treatment Patient Details Name: [name] MRN: [id number] DOB: 1931-01-20 Today's Date: 02/14/2013 Time: 4098-1191 PT Time Calculation (min): 18 min  PT Assessment / Plan / Recommendation  PT Comments   Pt up in recliner upon arrival.  Pt performed LE exercises and attempted sitting on edge of chair however pt fatigued quickly and requested return to more supine position.     Follow Up Recommendations  SNF     Does the patient have the potential to tolerate intense rehabilitation     Barriers to Discharge        Equipment Recommendations  None recommended by PT    Recommendations for Other Services    Frequency     Progress towards PT Goals Progress towards PT goals: Progressing toward goals  Plan Current plan remains appropriate    Precautions / Restrictions Precautions Precautions: Fall   Pertinent Vitals/Pain Pt reports pain however not rated and did not state where specifically just said "all over", repositioned to comfort prior to leaving room    Mobility       Exercises General Exercises - Lower Extremity Quad Sets: AROM;Both;10 reps;Supine Short Arc Quad: AAROM;10 reps;Supine;Both Heel Slides: AAROM;10 reps;Both;Supine Hip ABduction/ADduction: AAROM;10 reps;Supine;Both   PT Diagnosis:    PT Problem List:   PT Treatment Interventions:     PT Goals (current goals can now be found in the care plan section)    Visit Information  Last PT Received On: 02/14/13 Assistance Needed: +2    Subjective Data      Cognition  Cognition Arousal/Alertness: Awake/alert Overall Cognitive Status: No family/caregiver present to determine baseline cognitive functioning Area of Impairment: Following commands;Problem solving Following Commands: Follows one step commands with increased time Problem Solving: Slow processing;Decreased initiation    Balance  Balance Balance Assessed: Yes Static Sitting Balance Static Sitting - Balance Support:  Bilateral upper extremity supported;Feet supported Static Sitting - Level of Assistance: 4: Min assist Static Sitting - Comment/# of Minutes: min/guard for pt to pull trunk upright from reclined position using trunk musculature and UEs on armrests however unable to hold position more than 30 sec today  End of Session PT - End of Session Activity Tolerance: Patient limited by fatigue Patient left: in chair;with call bell/phone within reach   GP     [name],[name] 02/14/2013, 2:27 PM Zenovia Jarred, PT, DPT 02/14/2013 Pager: [phone]

## 2013-02-14 NOTE — Progress Notes (Signed)
NUTRITION FOLLOW UP  Intervention:   1. Continue Nepro and Pro-stat supplements. Recommend these continue at SNF.   2. Recommend change diet to REGULAR to allow for greater food choices and promote  Nutrition Dx:   Increased nutrient needs related to HD, wound healing as evidenced by estimated nutrition needs.Unmet  Goal:   Oral intake with meals and supplements to meet >/=90% estimated nutrition needs.   Monitor:   PO intake, weight trends, labs, I/O's  Assessment:   Pt with large sacral wound, pt was unable to tolerate hydrotherapy and surgery was consulted. Pt now s/p surgical debridement and wound vac placed 7/1. Has been able to tolerate HD in recliner. Waiting on placement, declined by [facility], his previous SNF is not able to take wound vac.  Pt reports his appetite has been good, and he is consuming the supplements provided (Nepro and Pro-stat). Review of meals documented shoe mostly 20-25% meal completion.   Height: Ht Readings from Last 1 Encounters:  02/13/13 6\' 6"  (1.981 m)    Weight Status:   Wt Readings from Last 1 Encounters:  02/13/13 181 lb 7 oz (82.3 kg)    Re-estimated needs:  Kcal: 2100 - 2300  Protein: 120 - 135 g  Fluid: 1.2 liters  Skin: stage IV sacral wound, now with vac.  unstagable R heel DTI to R and L ishium Stage II on L foot DTI on L heel  Diet Order: Renal   Intake/Output Summary (Last 24 hours) at 02/14/13 1237 Last data filed at 02/14/13 1120  Gross per 24 hour  Intake    720 ml  Output   2912 ml  Net  -2192 ml    Last BM: 7/7   Labs:   Recent Labs Lab 02/09/13 0820 02/11/13 1024 02/14/13 0726  NA 135 135 137  K 3.8 4.0 4.3  CL 96 98 100  CO2 28 27 25   BUN 42* 38* 58*  CREATININE 4.99* 4.65* 5.62*  CALCIUM 8.7 8.7 9.3  PHOS  --  3.2 4.5  GLUCOSE 105* 125* 102*    CBG (last 3)  No results found for this basename: GLUCAP,  in the last 72 hours  Scheduled Meds: .  ceFAZolin (ANCEF) IV  2 g Intravenous Q M,W,F-HD   . cinacalcet  30 mg Oral BID WC  . darbepoetin (ARANESP) injection - DIALYSIS  100 mcg Intravenous Q Wed-HD  . feeding supplement (NEPRO CARB STEADY)  237 mL Oral BID BM  . feeding supplement  30 mL Oral TID WC  . gabapentin  100 mg Oral Daily  . lanthanum  500 mg Oral TID WC  . metroNIDAZOLE  500 mg Oral Q8H  . multivitamin  1 tablet Oral Daily  . omega-3 acid ethyl esters  1 g Oral Daily  . sodium chloride  3 mL Intravenous Q12H  . vancomycin  1,000 mg Intravenous Q M,W,F-HD      Clarene Duke RD, LDN Pager [phone] After Hours pager [phone]

## 2013-02-14 NOTE — Progress Notes (Signed)
ANTIBIOTIC CONSULT NOTE - FOLLOW UP  Pharmacy Consult for vancomycin Indication: MRSA bacteremia, worsening sacral decub ulcers/presumed osteomyelitis   No Known Allergies  Patient Measurements: Height: 6\' 6"  (198.1 cm) Weight: 181 lb 7 oz (82.3 kg) IBW/kg (Calculated) : 91.4   Vital Signs: Temp: 98.6 F (37 C) (07/07 0650) Temp src: Oral (07/07 0650) BP: 102/70 mmHg (07/07 0930) Pulse Rate: 96 (07/07 0930) Intake/Output from previous day: 07/06 0701 - 07/07 0700 In: 840 [P.O.:840] Out: 1160 [Urine:1150; Drains:10] Intake/Output from this shift:    Labs:  Recent Labs  02/11/13 1024 02/14/13 0726  WBC 13.3* 13.0*  HGB 9.0* 9.5*  PLT 452* 547*  CREATININE 4.65* 5.62*   Estimated Creatinine Clearance: 12.2 ml/min (by C-G formula based on Cr of 5.62).  Recent Labs  02/14/13 0725  VANCORANDOM 20.3     Microbiology: Recent Results (from the past 720 hour(s))  CULTURE, BLOOD (ROUTINE X 2)     Status: None   Collection Time    01/29/13 12:30 AM      Result Value Range Status   Specimen Description BLOOD RIGHT ARM   Final   Special Requests BOTTLES DRAWN AEROBIC AND ANAEROBIC 10CC EACH   Final   Culture  Setup Time 01/29/2013 15:10   Final   Culture     Final   Value: METHICILLIN RESISTANT STAPHYLOCOCCUS AUREUS     Note: RIFAMPIN AND GENTAMICIN SHOULD NOT BE USED AS SINGLE DRUGS FOR TREATMENT OF STAPH INFECTIONS. CRITICAL RESULT CALLED TO, READ BACK BY AND VERIFIED WITH: ANNA AGUILAR 01/31/13 1440 BY SMITHERSJ     Note: Gram Stain Report Called to,Read Back By and Verified With: BERNADETTE LOMCALLO 01/30/13 @ 1:14PM BY RUSCA.   Report Status 02/01/2013 FINAL   Final   Organism ID, Bacteria METHICILLIN RESISTANT STAPHYLOCOCCUS AUREUS   Final  CULTURE, BLOOD (ROUTINE X 2)     Status: None   Collection Time    01/29/13 12:45 AM      Result Value Range Status   Specimen Description BLOOD LEFT HAND   Final   Special Requests BOTTLES DRAWN AEROBIC ONLY 10CC   Final   Culture  Setup Time 01/29/2013 15:11   Final   Culture     Final   Value: STAPHYLOCOCCUS AUREUS     Note: SUSCEPTIBILITIES PERFORMED ON PREVIOUS CULTURE WITHIN THE LAST 5 DAYS.     Note: Gram Stain Report Called to,Read Back By and Verified With: BERNADETTE [facility] 01/30/13 @ 1:14PM BY RUSCA.   Report Status 02/01/2013 FINAL   Final  URINE CULTURE     Status: None   Collection Time    01/29/13  1:56 AM      Result Value Range Status   Specimen Description URINE, RANDOM   Final   Special Requests NONE   Final   Culture  Setup Time 01/29/2013 15:24   Final   Colony Count NO GROWTH   Final   Culture NO GROWTH   Final   Report Status 01/30/2013 FINAL   Final  MRSA PCR SCREENING     Status: None   Collection Time    01/29/13  5:30 AM      Result Value Range Status   MRSA by PCR NEGATIVE  NEGATIVE Final   Comment:            The GeneXpert MRSA Assay (FDA     approved for NASAL specimens     only), is one component of a     comprehensive  MRSA colonization     surveillance program. It is not     intended to diagnose MRSA     infection nor to guide or     monitor treatment for     MRSA infections.  CULTURE, BLOOD (ROUTINE X 2)     Status: None   Collection Time    02/02/13  3:30 PM      Result Value Range Status   Specimen Description BLOOD RIGHT ARM   Final   Special Requests BOTTLES DRAWN AEROBIC AND ANAEROBIC 10CC   Final   Culture  Setup Time 02/03/2013 01:15   Final   Culture NO GROWTH 5 DAYS   Final   Report Status 02/09/2013 FINAL   Final  CULTURE, BLOOD (ROUTINE X 2)     Status: None   Collection Time    02/02/13  3:40 PM      Result Value Range Status   Specimen Description BLOOD RIGHT HAND   Final   Special Requests BOTTLES DRAWN AEROBIC ONLY 10CC   Final   Culture  Setup Time 02/03/2013 01:15   Final   Culture NO GROWTH 5 DAYS   Final   Report Status 02/09/2013 FINAL   Final    Anti-infectives   Start     Dose/Rate Route Frequency Ordered Stop   02/07/13 1200   vancomycin (VANCOCIN) IVPB 1000 mg/200 mL premix     1,000 mg 200 mL/hr over 60 Minutes Intravenous Every M-W-F (Hemodialysis) 02/04/13 1616     02/04/13 1800  ceFAZolin (ANCEF) IVPB 2 g/50 mL premix     2 g 100 mL/hr over 30 Minutes Intravenous Every M-W-F (Hemodialysis) 02/02/13 1520 03/18/13 1159   02/03/13 1400  metroNIDAZOLE (FLAGYL) tablet 500 mg     500 mg Oral 3 times per day 02/03/13 1105     02/02/13 1530  metroNIDAZOLE (FLAGYL) tablet 500 mg  Status:  Discontinued     500 mg Oral 3 times per day 02/02/13 1520 02/02/13 1520   01/31/13 2200  vancomycin (VANCOCIN) 50 mg/mL oral solution 125 mg  Status:  Discontinued     125 mg Oral Daily 01/31/13 2116 02/03/13 1105   01/31/13 2000  ceFEPIme (MAXIPIME) 2 g in dextrose 5 % 50 mL IVPB  Status:  Discontinued     2 g 100 mL/hr over 30 Minutes Intravenous Every M-W-F (2000) 01/29/13 0546 02/02/13 1032   01/31/13 1200  vancomycin (VANCOCIN) IVPB 750 mg/150 ml premix  Status:  Discontinued     750 mg 150 mL/hr over 60 Minutes Intravenous Every M-W-F (Hemodialysis) 01/29/13 0546 02/04/13 1616   01/29/13 0600  vancomycin (VANCOCIN) IVPB 750 mg/150 ml premix     750 mg 150 mL/hr over 60 Minutes Intravenous  Once 01/29/13 0545 01/29/13 0958   01/29/13 0600  ceFEPIme (MAXIPIME) 2 g in dextrose 5 % 50 mL IVPB     2 g 100 mL/hr over 30 Minutes Intravenous  Once 01/29/13 0546 01/29/13 0909   01/29/13 0045  vancomycin (VANCOCIN) IVPB 1000 mg/200 mL premix     1,000 mg 200 mL/hr over 60 Minutes Intravenous  Once 01/29/13 0033 01/29/13 0226   01/29/13 0045  piperacillin-tazobactam (ZOSYN) IVPB 2.25 g     2.25 g 100 mL/hr over 30 Minutes Intravenous  Once 01/29/13 0033 01/29/13 0333   01/29/13 0045  gentamicin (GARAMYCIN) IVPB 100 mg     100 mg 200 mL/hr over 30 Minutes Intravenous  Once 01/29/13 0043 01/29/13 0442  Assessment: 82 yom on Vancomycin, Ancef and Flagyl as recommended by ID for MRSA bacteremia and worsening sacral decub  ulcers/presumed osteomyelitis. ID recommending to treat for 6 weeks. Patient is currently afebrile and WBC stable ~13. Pre-HD vancomycin level drawn prior to HD session this AM was therapeutic at 20.3 mcg/ml - will continue Vancomycin 1g IV qHD (MWF) and recommend to check Vancomycin levels regularly upon discharge.   Goal of Therapy:  Pre-HD vancomycin level= 15-25 mcg/ml   Plan:  1. Continue Vancomycin 1g IV qHD (MWF) 2. Continue Ancef 2g IV qHD (MWF) and Flagyl 500mg  PO q8h - dosing appropriate for HD 3. Check pre-HD Vancomycin levels regularly (every 7-10 days)  Cleon Dew [phone] 02/14/2013,9:42 AM

## 2013-02-14 NOTE — Procedures (Signed)
Tolerating hemodialysis without hemodynamic difficulties at the current time.  Alert and appropriate.  Out pt EDW 77.5kg, wgt was 82.3 Kg on 7/6.  For volume removal.  Will evaluate ongoing need for Midodrine.  BP today 116/76 so far  Assess/Plan  1 MRSA sepsis / sacral wound w Vac= Vanc x 6 weeks+ ancef, with echo neg  2 ESRD, cont hd MWF /HD with recliner hd (as in OP center)  3 Anemia - prbc x 1 on 6/21, aranesp 100 q Monday hd/hgb 9.0<8.5  4 Sec HPT, cont sensipar and binders Phos and ca stable  5 BP/volume- +vol excess, 5kg up w dependent edema, BP up, off midodrine for now and max UF  6 Malnutrition - Alb 1.9>2.0 renal diet; nepro bid,  7 Sacral decub- s/p I&D, wound VAC 7/2, foley to dec contamination  8 Cdiff +, cont po flagyl  9 Dispo/ deconditioning- nh dc when nh found that takes vac [place] [facility] [facility]

## 2013-02-14 NOTE — Progress Notes (Signed)
[facility] PROGRESS NOTE  [name] RUE:[id number] DOB: 1931-03-31 DOA: 01/29/2013 PCP: Crawford Givens, MD  Assessment/Plan: Principal Problem:   Sepsis Active Problems:   End stage renal disease   Anemia of chronic disease   Decubitus ulcer of sacral region, stage 4   Protein-calorie malnutrition, severe   Pain in wound     Interim summary/ hospital course so far:  79 y.o. male history of ESRD on hemodialysis who was recently admitted (5/19 - 01/10/2013) for sepsis secondary to C. difficile colitis was brought back to the ER from nursing home 6/21 after patient was found to be hypotensive. On arrival patient also was found to be febrile, tachycardic, and hemoglobin low from his baseline. Patient was started on empiric vancomycin and cefepime after the blood cultures were obtained, given fluid bolus and had RBC transfusion for hypotension in ED. He was admitted to SDU.  Patient's stage 4 sacral decubitus had a discharge on exam at presentation. Patient's family had a detailed discussion with ER physician Dr. Arnoldo Morale and they requested to continue with DO NOT RESUSCITATE status and no vasopressors. They discussed the possibility of moving towards comfort measures. Wound care and Infectious disease were consulted, blood cultures revealed MRSA bacteremia  Palliative Care consult was requested to establish GOC with family and per meting, continue medical care. Patient was not able to tolerate hydrotherapy due to severe pain and the family wanted aggressive care with his wound, hence general surgery was also consulted. Patient underwent operative debridement in OR on 02/08/13 and wound vac was placed.  [facility] by [facility] and [facility] SNF at [facility] were not able to handle wound vac. SW has started search for new SNF.  Renal has been following for ESRD, on HD MWF.   Consults;  Renal- Dr Arta Silence  Surgery- Dr Corliss Skains  Palliative medicine- signed off  Wound Care   Assessment/Plan:   Principal Problem:  MRSA Sepsis / leukocytosis  - suspected source of entry is sacral decubitus stage IV with osteomyelitis, hypotension improved, fevers resolved.  - blood cx both + for MRSA (2/2), negative repeat cultures 6/25  - recs per ID: stop date 8/8 for vanc and ancef   TTE to evaluate for vegetations shows none- no need for TEE as patient is going to be treated  - presumed osteomyelitis: he will need to vancomycin x 6 wks in addition to gram negative (ancef) and flagyl  - c.difficile infection- resolved- giving flagyl  - The patient is receiving antibiotics with hemodialysis, MWF and Flagyl.    Decubitus ulcer of sacral region, stage 4  - wound care consult performed - wounds are felt to be beyond conservative tx at this time - Palliative Care consult requested to establish GOC with family - plan to continue medical care, general surgery was consulted and bedside debridement done with good results- recommending hydrotherapy  - appreciate surgery input, postop day 5, wound VAC placed, change dressings MWF per WOC consult note     End stage renal disease  - cont dialysis  Anemia of chronic disease  - stable  Protein-calorie malnutrition, severe  - nutritional supplements  DM  CBG reasonably controlled at present time  Hx of HTN  Hx of CVA with suspected vascular dementia  Decubitus ulcer of R heel  Severe protein calorie malnutrition  DVT Prophylaxis:  Code Status:  Disposition: Discharge to SNF when bed available per social services  Subjective:  Seen this morning, no complaints at all, watching TV   Objective:  Filed Vitals:   02/14/13 1030 02/14/13 1100 02/14/13 1109 02/14/13 1120  BP: 104/64 98/61 109/73 112/68  Pulse: 98 102 102 100  Temp:   98.2 F (36.8 C) 97.5 F (36.4 C)  TempSrc:   Oral Oral  Resp:   16 16  Height:      Weight:      SpO2:   96% 97%    Intake/Output Summary (Last 24 hours) at 02/14/13 1212 Last data filed at 02/14/13 1120   Gross per 24 hour  Intake    720 ml  Output   2912 ml  Net  -2192 ml    Exam:  HENT:  Head: Atraumatic.  Nose: Nose normal.  Mouth/Throat: Oropharynx is clear and moist.  Eyes: Conjunctivae are normal. Pupils are equal, round, and reactive to light. No scleral icterus.  Neck: Neck supple. No tracheal deviation present.  Cardiovascular: Normal rate, regular rhythm, normal heart sounds and intact distal pulses.  Pulmonary/Chest: Effort normal and breath sounds normal. No respiratory distress.  Abdominal: Soft. Normal appearance and bowel sounds are normal. She exhibits no distension. There is no tenderness.  Musculoskeletal: She exhibits no edema and no tenderness.  Neurological: She is alert. No cranial nerve deficit.    Data Reviewed: Basic Metabolic Panel:  Recent Labs Lab 02/08/13 1643 02/09/13 0820 02/11/13 1024 02/14/13 0726  NA 138 135 135 137  K 3.5 3.8 4.0 4.3  CL  --  96 98 100  CO2  --  28 27 25   GLUCOSE 87 105* 125* 102*  BUN  --  42* 38* 58*  CREATININE  --  4.99* 4.65* 5.62*  CALCIUM  --  8.7 8.7 9.3  PHOS  --   --  3.2 4.5    Liver Function Tests:  Recent Labs Lab 02/11/13 1024 02/14/13 0726  ALBUMIN 2.0* 2.2*   No results found for this basename: LIPASE, AMYLASE,  in the last 168 hours No results found for this basename: AMMONIA,  in the last 168 hours  CBC:  Recent Labs Lab 02/08/13 1643 02/09/13 0820 02/11/13 1024 02/14/13 0726  WBC  --  12.2* 13.3* 13.0*  HGB 10.5* 8.5* 9.0* 9.5*  HCT 31.0* 26.5* 27.6* 28.8*  MCV  --  81.3 82.1 80.7  PLT  --  487* 452* 547*    Cardiac Enzymes: No results found for this basename: CKTOTAL, CKMB, CKMBINDEX, TROPONINI,  in the last 168 hours BNP (last 3 results) No results found for this basename: PROBNP,  in the last 8760 hours   CBG:  Recent Labs Lab 02/08/13 1746  GLUCAP 90    No results found for this or any previous visit (from the past 240 hour(s)).   Studies: Dg Chest Port 1  View  01/29/2013   *RADIOLOGY REPORT*  Clinical Data: Sepsis, hypotension, unresponsive, history hypertension, diabetes, end-stage renal disease on dialysis  PORTABLE CHEST - 1 VIEW  Comparison: Portable exam 0055 hours compared to 12/27/2012  Findings: Upper normal heart size. Atherosclerotic calcification aorta. Stable mediastinal contours and pulmonary vascularity. Bronchitic changes with minimal right basilar atelectasis. No definite acute infiltrate, pleural effusion or pneumothorax. Bilateral chronic rotator cuff tears and AC joint degenerative changes. Scattered endplate spur formation thoracic spine.  IMPRESSION: Bronchitic changes with minimal right basilar atelectasis.   Original Report Authenticated By: Ulyses Southward, M.D.    Scheduled Meds: .  ceFAZolin (ANCEF) IV  2 g Intravenous Q M,W,F-HD  . cinacalcet  30 mg Oral BID WC  . darbepoetin (ARANESP)  injection - DIALYSIS  100 mcg Intravenous Q Wed-HD  . feeding supplement (NEPRO CARB STEADY)  237 mL Oral BID BM  . feeding supplement  30 mL Oral TID WC  . gabapentin  100 mg Oral Daily  . lanthanum  500 mg Oral TID WC  . metroNIDAZOLE  500 mg Oral Q8H  . multivitamin  1 tablet Oral Daily  . omega-3 acid ethyl esters  1 g Oral Daily  . sodium chloride  3 mL Intravenous Q12H  . vancomycin  1,000 mg Intravenous Q M,W,F-HD   Continuous Infusions:   Principal Problem:   Sepsis Active Problems:   End stage renal disease   Anemia of chronic disease   Decubitus ulcer of sacral region, stage 4   Protein-calorie malnutrition, severe   Pain in wound    Time spent: 40 minutes   [facility]  [facility] Pager [phone]. If 8PM-8AM, please contact night-coverage at [email or web address], password [facility] 02/14/2013, 12:12 PM  LOS: 16 days

## 2013-02-14 NOTE — Progress Notes (Signed)
Physical Therapy Wound Treatment Patient Details  Name: [name] MRN: [id number] Date of Birth: May 01, 1931  Today's Date: 02/14/2013 Time: 1430-1530 Time Calculation (min): 60 min  Subjective  Subjective: It's a bit painful...this wasn't supposed to happen today Patient and Family Stated Goals: not stated Prior Treatments: pulsatile lavage, debridement  Pain Score:    Wound Assessment  Pressure Ulcer 12/27/12 Unstageable - Full thickness tissue loss in which the base of the ulcer is covered by slough (yellow, tan, gray, green or brown) and/or eschar (tan, brown or black) in the wound bed. (Active)  State of Healing Early/partial granulation 02/14/2013  3:37 PM  Site / Wound Assessment Yellow;Pink;Red 02/14/2013  3:37 PM  % Wound base Red or Granulating 75% 02/14/2013  3:37 PM  % Wound base Yellow 20% 02/14/2013  3:37 PM  % Wound base Black 0% 02/14/2013  3:37 PM  % Wound base Other (Comment) 5% 02/14/2013  3:37 PM  Peri-wound Assessment Erythema (blanchable) 02/13/2013  9:00 AM  Wound Length (cm) 17 cm 02/10/2013 10:51 AM  Wound Width (cm) 7 cm 02/10/2013 10:51 AM  Wound Depth (cm) 3 cm 02/10/2013 10:51 AM  Tunneling (cm) 0 02/02/2013 12:30 PM  Undermining (cm) 1 02/02/2013 12:30 PM  Margins Unattacted edges (unapproximated) 02/14/2013  3:37 PM  Drainage Amount Other (Comment) 02/14/2013  3:37 PM  Drainage Description Serosanguineous 02/14/2013  3:37 PM  Treatment Negative pressure wound therapy 02/12/2013  9:00 AM  Dressing Type Negative pressure wound therapy 02/14/2013 11:27 AM  Dressing Clean;Dry 02/14/2013 11:27 AM     Pressure Ulcer 01/29/13 Deep Tissue Injury - Purple or maroon localized area of discolored intact skin or blood-filled blister due to damage of underlying soft tissue from pressure and/or shear. Black and purple. Closed  (Active)  State of Healing Other (Comment) 02/13/2013  9:00 AM  Site / Wound Assessment Clean;Dry;Black 02/13/2013  9:00 AM  % Wound base Red or Granulating 60% 02/02/2013  12:30 PM  % Wound base Yellow 0% 02/02/2013 12:30 PM  % Wound base Black 40% 02/02/2013 12:30 PM  Peri-wound Assessment Edema 02/13/2013  8:22 PM  Wound Length (cm) 3 cm 02/09/2013  4:32 PM  Wound Width (cm) 2 cm 02/09/2013  4:32 PM  Wound Depth (cm) 0 cm 02/09/2013  4:32 PM  Tunneling (cm) 0 02/02/2013 12:30 PM  Undermining (cm) 0 02/02/2013 12:30 PM  Margins Unattacted edges (unapproximated) 02/13/2013  9:00 AM  Drainage Amount Scant 02/13/2013  9:00 AM  Drainage Description Purulent 02/13/2013  9:00 AM  Treatment Off loading 02/13/2013  9:00 AM  Dressing Type Foam 02/14/2013 11:27 AM  Dressing Clean;Dry 02/14/2013 11:27 AM     Pressure Ulcer 02/03/13 Deep Tissue Injury - Purple or maroon localized area of discolored intact skin or blood-filled blister due to damage of underlying soft tissue from pressure and/or shear. (Active)  State of Healing Epithelialized 02/13/2013  9:00 AM  Site / Wound Assessment Black 02/13/2013  9:00 AM  Peri-wound Assessment Intact 02/13/2013  9:00 AM  Wound Length (cm) 2.5 cm 02/09/2013  4:32 PM  Wound Width (cm) 4 cm 02/09/2013  4:32 PM  Wound Depth (cm) 0 cm 02/09/2013  4:32 PM  Drainage Amount Minimal 02/13/2013  9:00 AM  Drainage Description Purulent 02/13/2013  9:00 AM  Treatment Off loading;Other (Comment) 02/13/2013  9:00 AM  Dressing Type Foam 02/14/2013 11:27 AM  Dressing Clean;Dry 02/14/2013 11:27 AM     Negative Pressure Wound Therapy Sacrum Mid;Upper (Active)  Last dressing change 02/11/13 02/13/2013  8:22  PM  Site / Wound Assessment Other (Comment) 02/14/2013 11:27 AM  Peri-wound Assessment Intact;Purple 02/13/2013  8:22 PM  Cycle Continuous 02/14/2013 11:27 AM  Target Pressure (mmHg) 125 02/14/2013 11:27 AM  Dressing Status Intact 02/14/2013 11:27 AM  Drainage Amount Moderate 02/14/2013 11:27 AM  Drainage Description Serosanguineous 02/14/2013 11:27 AM  Output (mL) 10 mL 02/13/2013  7:00 PM     Wound 12/27/12 Other (Comment) Heel Right (Active)  Site / Wound Assessment Black;Granulation tissue  02/09/2013  4:32 PM  % Wound base Red or Granulating 0% 02/02/2013 12:30 PM  % Wound base Yellow 25% 02/02/2013 12:30 PM  % Wound base Black 65% 02/02/2013 12:30 PM  % Wound base Other (Comment) 0% 02/02/2013 12:30 PM  Peri-wound Assessment Purple;Maceration 02/09/2013  4:32 PM  Wound Length (cm) 4 cm 02/09/2013  4:32 PM  Wound Width (cm) 7.5 cm 02/09/2013  4:32 PM  Wound Depth (cm) 0 cm 02/09/2013  4:32 PM  Tunneling (cm) 0 02/02/2013 12:30 PM  Undermining (cm) 0 02/02/2013 12:30 PM  Margins Unattacted edges (unapproximated) 02/02/2013 12:30 PM  Closure None 02/02/2013 12:30 PM  Drainage Amount Minimal 02/09/2013  4:32 PM  Treatment Cleansed 02/02/2013 12:30 PM  Dressing Type Foam 02/09/2013  4:32 PM  Dressing Changed Changed 02/09/2013  4:32 PM  Dressing Status Clean;Dry;Intact 02/09/2013  4:32 PM     Wound 12/27/12 Other (Comment) Toe (Comment  which one) Right (Active)  Site / Wound Assessment Clean;Dry 02/14/2013 11:27 AM  % Wound base Red or Granulating 0% 02/02/2013 12:30 PM  % Wound base Yellow 0% 02/02/2013 12:30 PM  % Wound base Black 100% 02/14/2013 11:27 AM  % Wound base Other (Comment) 0% 02/02/2013 12:30 PM  Peri-wound Assessment Intact 02/14/2013 11:27 AM  Wound Length (cm) 2 cm 02/02/2013 12:30 PM  Wound Width (cm) 1.5 cm 02/02/2013 12:30 PM  Wound Depth (cm) 1 cm 02/02/2013 12:30 PM  Tunneling (cm) 0.1 12/31/2012  8:39 AM  Undermining (cm) 0.1 12/31/2012  8:39 AM  Margins Attached edges (approximated) 02/14/2013 11:27 AM  Closure None 02/14/2013 11:27 AM  Drainage Amount None 02/14/2013 11:27 AM  Treatment Cleansed 02/12/2013  9:00 AM  Dressing Type None 02/14/2013 11:27 AM     Incision 04/13/12 Arm Left (Active)     Incision 02/08/13 Other (Comment) Other (Comment) (Active)  Site / Wound Assessment Clean;Dry 02/08/2013 10:27 PM  Drainage Amount Minimal 02/08/2013 10:27 PM  Drainage Description Serosanguineous 02/08/2013 10:27 PM  Dressing Type Negative pressure wound therapy 02/08/2013 10:27 PM  Dressing  Dry;Intact;Clean 02/08/2013 10:27 PM   Hydrotherapy Ultrasonic mist  - wound location: sacrum Ultrasonic mist at 35KHz (+/-3KHz) at ___ percent: 100 % Ultrasonic mist therapy minutes: 5 min Selective Debridement Selective Debridement - Location: sacrum Selective Debridement - Tools Used: Forceps;Scissors Selective Debridement - Tissue Removed: yellow slough   Wound Assessment and Plan  Wound Therapy - Assess/Plan/Recommendations Wound Therapy - Clinical Statement: Pt with large sacral wound that can benefit from hydrotherapy to decr necrotic tissue and promote healing. Wound Therapy - Functional Problem List: immobility Factors Delaying/Impairing Wound Healing: Diabetes Mellitus;Altered sensation;Immobility Hydrotherapy Plan: Debridement;Dressing change;Patient/family education;Ultrasonic wound therapy @35  KHz (+/- 3 KHz) Wound Therapy - Frequency: 6X / week Wound Therapy - Follow Up Recommendations: Skilled nursing facility Wound Plan: decr. bacterial load and selectively debide to healthy tissues  Wound Therapy Goals- Improve the function of patient's integumentary system by progressing the wound(s) through the phases of wound healing (inflammation - proliferation - remodeling) by: Decrease Necrotic Tissue to:  10 Decrease Necrotic Tissue - Progress: Progressing toward goal Increase Granulation Tissue to: 90 (incl. healthy CT) Increase Granulation Tissue - Progress: Progressing toward goal Decrease Length/Width/Depth - Progress: Partly met Improve Drainage Characteristics - Progress: Discontinued (comment) Goals/treatment plan/discharge plan were made with and agreed upon by patient/family: Yes Time For Goal Achievement: 2 weeks Wound Therapy - Potential for Goals: Good  Goals will be updated until maximal potential achieved or discharge criteria met.  Discharge criteria: when goals achieved, discharge from hospital, MD decision/surgical intervention, no progress towards goals,  refusal/missing three consecutive treatments without notification or medical reason.  GP     [name], [name] 02/14/2013, 3:40 PM  02/14/2013  [name], PT [phone]  (pager)

## 2013-02-15 MED ORDER — CEFAZOLIN SODIUM-DEXTROSE 2-3 GM-% IV SOLR: 2.0000 g | INTRAVENOUS | Status: AC

## 2013-02-15 MED ORDER — METRONIDAZOLE 500 MG PO TABS: 500.0000 mg | ORAL_TABLET | Freq: Three times a day (TID) | ORAL | Status: AC

## 2013-02-15 MED ORDER — VANCOMYCIN HCL IN DEXTROSE 1-5 GM/200ML-% IV SOLN: 1000.0000 mg | INTRAVENOUS | Status: AC

## 2013-02-15 NOTE — Progress Notes (Signed)
Subjective:  Eating brk, no cos.Reported tolerated hd in recliner yesterday Objective Vital signs in last 24 hours: Filed Vitals:   02/14/13 1400 02/14/13 1800 02/14/13 2038 02/15/13 0501  BP: 121/72 135/64 137/74 109/62  Pulse: 94 88 90 95  Temp: 97.6 F (36.4 C) 97.8 F (36.6 C) 98.6 F (37 C) 98.6 F (37 C)  TempSrc: Oral Oral Oral Oral  Resp: 18 18 18 18   Height:   6\' 6"  (1.981 m)   Weight:   82.3 kg (181 lb 7 oz)   SpO2: 98% 98% 99% 97%   Weight change: 0 kg (0 lb)  Intake/Output Summary (Last 24 hours) at 02/15/13 0828 Last data filed at 02/15/13 0521  Gross per 24 hour  Intake    650 ml  Output   2478 ml  Net  -1828 ml   Labs: Basic Metabolic Panel:  Recent Labs Lab 02/09/13 0820 02/11/13 1024 02/14/13 0726  NA 135 135 137  K 3.8 4.0 4.3  CL 96 98 100  CO2 28 27 25   GLUCOSE 105* 125* 102*  BUN 42* 38* 58*  CREATININE 4.99* 4.65* 5.62*  CALCIUM 8.7 8.7 9.3  PHOS  --  3.2 4.5    Recent Labs Lab 02/11/13 1024 02/14/13 0726  ALBUMIN 2.0* 2.2*   CBC:  Recent Labs Lab 02/09/13 0820 02/11/13 1024 02/14/13 0726  WBC 12.2* 13.3* 13.0*  HGB 8.5* 9.0* 9.5*  HCT 26.5* 27.6* 28.8*  MCV 81.3 82.1 80.7  PLT 487* 452* 547*   CBG:  Recent Labs Lab 02/08/13 1746  GLUCAP 90   Medications:   .  ceFAZolin (ANCEF) IV  2 g Intravenous Q M,W,F-HD  . cinacalcet  30 mg Oral BID WC  . darbepoetin (ARANESP) injection - DIALYSIS  100 mcg Intravenous Q Wed-HD  . feeding supplement (NEPRO CARB STEADY)  237 mL Oral BID BM  . feeding supplement  30 mL Oral TID WC  . gabapentin  100 mg Oral Daily  . lanthanum  500 mg Oral TID WC  . metroNIDAZOLE  500 mg Oral Q8H  . multivitamin  1 tablet Oral Daily  . omega-3 acid ethyl esters  1 g Oral Daily  . sodium chloride  3 mL Intravenous Q12H  . vancomycin  1,000 mg Intravenous Q M,W,F-HD  Physical Exam:  General: alert, pleasant bm, nad  Heart: RRR , no rub or mur  Lungs: CTA Bilaterally  Abdomen: BS positive,  soft, nontender  Extremities: Dialysis Access: Bipedal edema 1 + and improving ,with soft boots bipedal/ LU A AVF pos. Bruit   Outpatient HD (MWF [facility])  4hrs 77.5kg 2Ca LUA AVF Heparin 5000  EPO 6200   Assess/Plan  1 MRSA sepsis / sacral wound w Vac= Vanc x 6 weeks+ ancef, with echo neg  2 ESRD, cont hd MWF /HD with recliner hd and tolerating last 2 hd txs 3 Anemia - prbc x 1 on 6/21, aranesp 100 q Monday hd/hgb 9.5<9.0<8.5  4 Sec HPT, cont sensipar and binders Phos and ca stable  5 BP/volume- vol excess with pedal edema slowly imp[roving  uf attempt on hd in am 3 to 4 l 6 Malnutrition - Alb 1.9>2.0> 2.2 improving somewhat on renal diet; nepro bid,  7 Sacral decub- s/p I&D, wound VAC 7/2, foley to dec contamination / 250 cc in foley this am 8 Cdiff +, cont po flagyl  9 Dispo/ deconditioning- nh dc when nh found that takes vac   Patrick Pastel, PA-C [facility] Beeper [phone] 02/15/2013,8:28 AM  LOS: 17 days   Stable. Just frustrated by overall condition.  Will need to clarify outpt HD needs. [name] C

## 2013-02-15 NOTE — Discharge Summary (Signed)
Physician Discharge Summary  [name] MRN: [id number] DOB/AGE: Dec 12, 1930 79 y.o.  PCP: Crawford Givens, MD   Admit date: 01/29/2013 Discharge date: 02/15/2013  Discharge Diagnoses:      Sepsis Active Problems:   End stage renal disease   Anemia of chronic disease   Decubitus ulcer of sacral region, stage 4   Protein-calorie malnutrition, severe   Pain in wound     Medication List         ceFAZolin 2-3 GM-% Solr  Commonly known as:  ANCEF  Inject 50 mLs (2 g total) into the vein every Monday, Wednesday, and Friday with hemodialysis.     dipyridamole-aspirin 200-25 MG per 12 hr capsule  Commonly known as:  AGGRENOX  Take 1 capsule by mouth 2 (two) times daily.     feeding supplement (NEPRO CARB STEADY) Liqd  Take 237 mLs by mouth 2 (two) times daily between meals.     Fish Oil 1000 MG Caps  Take 1 capsule by mouth daily.     gabapentin 100 MG capsule  Commonly known as:  NEURONTIN  Take 100 mg by mouth daily.     lanthanum 500 MG chewable tablet  Commonly known as:  FOSRENOL  Chew 1 tablet (500 mg total) by mouth 3 (three) times daily with meals.     metroNIDAZOLE 500 MG tablet  Commonly known as:  FLAGYL  Take 1 tablet (500 mg total) by mouth every 8 (eight) hours.     multivitamin Tabs tablet  Take 1 tablet by mouth daily.     oxyCODONE 5 MG immediate release tablet  Commonly known as:  Oxy IR/ROXICODONE  Take 1 tablet (5 mg total) by mouth every 6 (six) hours as needed for pain.     SENSIPAR 30 MG tablet  Generic drug:  cinacalcet  Take 30 mg by mouth 2 (two) times daily.     vancomycin 1 GM/200ML Soln  Commonly known as:  VANCOCIN  Inject 200 mLs (1,000 mg total) into the vein every Monday, Wednesday, and Friday with hemodialysis.        Discharge Condition: Stable  Disposition: 03-Skilled Nursing Facility   Consults:  Nephrology Surgery   Significant Diagnostic Studies: Dg Chest Port 1 View  01/29/2013   *RADIOLOGY REPORT*   Clinical Data: Sepsis, hypotension, unresponsive, history hypertension, diabetes, end-stage renal disease on dialysis  PORTABLE CHEST - 1 VIEW  Comparison: Portable exam 0055 hours compared to 12/27/2012  Findings: Upper normal heart size. Atherosclerotic calcification aorta. Stable mediastinal contours and pulmonary vascularity. Bronchitic changes with minimal right basilar atelectasis. No definite acute infiltrate, pleural effusion or pneumothorax. Bilateral chronic rotator cuff tears and AC joint degenerative changes. Scattered endplate spur formation thoracic spine.  IMPRESSION: Bronchitic changes with minimal right basilar atelectasis.   Original Report Authenticated By: Ulyses Southward, M.D.      Microbiology: No results found for this or any previous visit (from the past 240 hour(s)).   Labs: Results for orders placed during the hospital encounter of 01/29/13 (from the past 48 hour(s))  VANCOMYCIN, RANDOM     Status: None   Collection Time    02/14/13  7:25 AM      Result Value Range   Vancomycin Rm 20.3     Comment:            Random Vancomycin therapeutic     range is dependent on dosage and     time of specimen collection.     A peak  range is 20.0-40.0 ug/mL     A trough range is 5.0-15.0 ug/mL             CBC     Status: Abnormal   Collection Time    02/14/13  7:26 AM      Result Value Range   WBC 13.0 (*) 4.0 - 10.5 K/uL   RBC 3.57 (*) 4.22 - 5.81 MIL/uL   Hemoglobin 9.5 (*) 13.0 - 17.0 g/dL   HCT 16.1 (*) 09.6 - 04.5 %   MCV 80.7  78.0 - 100.0 fL   MCH 26.6  26.0 - 34.0 pg   MCHC 33.0  30.0 - 36.0 g/dL   RDW 40.9 (*) 81.1 - 91.4 %   Platelets 547 (*) 150 - 400 K/uL  RENAL FUNCTION PANEL     Status: Abnormal   Collection Time    02/14/13  7:26 AM      Result Value Range   Sodium 137  135 - 145 mEq/L   Potassium 4.3  3.5 - 5.1 mEq/L   Chloride 100  96 - 112 mEq/L   CO2 25  19 - 32 mEq/L   Glucose, Bld 102 (*) 70 - 99 mg/dL   BUN 58 (*) 6 - 23 mg/dL   Creatinine, Ser  7.82 (*) 0.50 - 1.35 mg/dL   Calcium 9.3  8.4 - 95.6 mg/dL   Phosphorus 4.5  2.3 - 4.6 mg/dL   Albumin 2.2 (*) 3.5 - 5.2 g/dL   GFR calc non Af Amer 9 (*) >90 mL/min   GFR calc Af Amer 10 (*) >90 mL/min   Comment:            The eGFR has been calculated     using the CKD EPI equation.     This calculation has not been     validated in all clinical     situations.     eGFR's persistently     <90 mL/min signify     possible Chronic Kidney Disease.     Interim summary/ hospital course so far:  79 y.o. male history of ESRD on hemodialysis who was recently admitted (5/19 - 01/10/2013) for sepsis secondary to C. difficile colitis was brought back to the ER from nursing home 6/21 after patient was found to be hypotensive. On arrival patient also was found to be febrile, tachycardic, and hemoglobin low from his baseline. Patient was started on empiric vancomycin and cefepime after the blood cultures were obtained, given fluid bolus and had RBC transfusion for hypotension in ED. He was admitted to SDU.  Patient's stage 4 sacral decubitus had a discharge on exam at presentation. Patient's family had a detailed discussion with ER physician Dr. Arnoldo Morale and they requested to continue with DO NOT RESUSCITATE status and no vasopressors. They discussed the possibility of moving towards comfort measures. Wound care and Infectious disease were consulted, blood cultures revealed MRSA bacteremia  Palliative Care consult was requested to establish GOC with family and per meeting, continue medical care. Patient was not able to tolerate hydrotherapy due to severe pain and the family wanted aggressive care with his wound, hence general surgery was also consulted. Patient underwent operative debridement in OR on 02/08/13 and wound vac was placed.  [facility] was declined by [facility] and [facility] SNF at [facility] were not able to handle wound vac. SW has started search for new SNF.  Renal has been following for ESRD, on HD MWF.    Consults;  Renal- Dr Arta Silence  Surgery- Dr Corliss Skains  Palliative medicine- signed off  Wound Care  Assessment/Plan:  Principal Problem:  MRSA Sepsis / leukocytosis  - suspected source of entry is sacral decubitus stage IV with osteomyelitis, hypotension improved, fevers resolved.  - blood cx both + for MRSA (2/2), negative repeat cultures 6/25  Infectious disease recommendations, stop date 8/8 for vanc and ancef   no need for TEE as patient is going to be treated  - presumed osteomyelitis: he will need to vancomycin x 6 wks in addition to gram negative (ancef) and flagyl      - c.difficile infection- resolved- giving flagyl , continue Flagyl for another week beyond antibiotics completion - The patient is receiving antibiotics with hemodialysis, MWF and Flagyl.    Decubitus ulcer of sacral region, stage 4  - wound care consult performed - wounds are felt to be beyond conservative tx at this time - Palliative Care consult requested to establish GOC with family - plan to continue medical care, general surgery was consulted and bedside debridement done with good results- recommending hydrotherapy  - appreciate surgery input, postop day 7, wound VAC placed, change dressings MWF per WOC consult note    End stage renal disease  - cont dialysis  Anemia of chronic disease  - stable  Protein-calorie malnutrition, severe  - nutritional supplements  DM  CBG reasonably controlled at present time  Hx of HTN  Hx of CVA with suspected vascular dementia  Decubitus ulcer of R heel      Discharge Exam:  Blood pressure 109/62, pulse 95, temperature 98.6 F (37 C), temperature source Oral, resp. rate 18, height 6\' 6"  (1.981 m), weight 82.3 kg (181 lb 7 oz), SpO2 97.00%.  Neck: Neck supple. No tracheal deviation present.  Cardiovascular: Normal rate, regular rhythm, normal heart sounds and intact distal pulses.  Pulmonary/Chest: Effort normal and breath sounds normal. No respiratory distress.   Abdominal: Soft. Normal appearance and bowel sounds are normal. She exhibits no distension. There is no tenderness.  Musculoskeletal: She exhibits no edema and no tenderness.  Neurological: She is alert. No cranial nerve deficit.          SignedRicharda Overlie 02/15/2013, 7:47 AM

## 2013-02-15 NOTE — Clinical Social Work Note (Signed)
Patient was to discharge to [facility] today, however after verifying insurance (Medicare advantage plan), facility had concerns regarding family's ability to pay co-pay or continue to pay if insurance stops paying. Per admissions director Bonita Quin at [facility], patient is at day 75 of SNF rehab days and will have a daily co-pay of approx. $100. There are concerns with insurance no longer paying if patient cannot actively participate in therapies (PT/OT). CSW talked with patient's son [name] to determine if family had applied for Medicaid for patient or if they had the ability to pay out-of-pocket if insurance stops paying. Son reported that their attorney was going to apply for Medicaid for patient, however he does not know if application has been made. Son indicated that he will contact attorney tomorrow and let CSW know.  CSW will follow-up with son on Wednesday, 7/8/.  Genelle Bal, MSW, LCSW [phone]

## 2013-02-16 LAB — BASIC METABOLIC PANEL
BUN: 54 mg/dL — ABNORMAL HIGH (ref 6–23)
CO2: 28 mEq/L (ref 19–32)
Calcium: 9.3 mg/dL (ref 8.4–10.5)
Chloride: 97 mEq/L (ref 96–112)
Creatinine, Ser: 5.03 mg/dL — ABNORMAL HIGH (ref 0.50–1.35)
GFR calc Af Amer: 11 mL/min — ABNORMAL LOW (ref >90)
GFR calc non Af Amer: 10 mL/min — ABNORMAL LOW (ref >90)
Glucose, Bld: 98 mg/dL (ref 70–99)
Potassium: 3.7 mEq/L (ref 3.5–5.1)
Sodium: 134 mEq/L — ABNORMAL LOW (ref 135–145)

## 2013-02-16 LAB — CBC
HCT: 28.8 % — ABNORMAL LOW (ref 39.0–52.0)
Hemoglobin: 9.4 g/dL — ABNORMAL LOW (ref 13.0–17.0)
MCH: 26.4 pg (ref 26.0–34.0)
MCHC: 32.6 g/dL (ref 30.0–36.0)
MCV: 80.9 fL (ref 78.0–100.0)
Platelets: 465 10*3/uL — ABNORMAL HIGH (ref 150–400)
RBC: 3.56 MIL/uL — ABNORMAL LOW (ref 4.22–5.81)
RDW: 17.1 % — ABNORMAL HIGH (ref 11.5–15.5)
WBC: 9.9 10*3/uL (ref 4.0–10.5)

## 2013-02-16 MED ORDER — OXYCODONE HCL 5 MG PO TABS
ORAL_TABLET | ORAL | Status: AC
  Administered 2013-02-16: 10 mg via ORAL
  Filled 2013-02-16: qty 2

## 2013-02-16 MED ORDER — DARBEPOETIN ALFA-POLYSORBATE 100 MCG/0.5ML IJ SOLN
INTRAMUSCULAR | Status: AC
  Administered 2013-02-16: 100 ug via INTRAVENOUS
  Filled 2013-02-16: qty 0.5

## 2013-02-16 NOTE — Discharge Summary (Signed)
No change in discharge summary from 02/15/13  Admit date: 01/29/2013  Discharge date: 02/16/2013    Discharge Diagnoses:  Sepsis  Active Problems:  End stage renal disease  Anemia of chronic disease  Decubitus ulcer of sacral region, stage 4  Protein-calorie malnutrition, severe  Pain in wound    Medication List         ceFAZolin 2-3 GM-% Solr    Commonly known as: ANCEF    Inject 50 mLs (2 g total) into the vein every Monday, Wednesday, and Friday with hemodialysis.    dipyridamole-aspirin 200-25 MG per 12 hr capsule    Commonly known as: AGGRENOX    Take 1 capsule by mouth 2 (two) times daily.    feeding supplement (NEPRO CARB STEADY) Liqd    Take 237 mLs by mouth 2 (two) times daily between meals.    Fish Oil 1000 MG Caps    Take 1 capsule by mouth daily.    gabapentin 100 MG capsule    Commonly known as: NEURONTIN    Take 100 mg by mouth daily.    lanthanum 500 MG chewable tablet    Commonly known as: FOSRENOL    Chew 1 tablet (500 mg total) by mouth 3 (three) times daily with meals.    metroNIDAZOLE 500 MG tablet    Commonly known as: FLAGYL    Take 1 tablet (500 mg total) by mouth every 8 (eight) hours.    multivitamin Tabs tablet    Take 1 tablet by mouth daily.    oxyCODONE 5 MG immediate release tablet    Commonly known as: Oxy IR/ROXICODONE    Take 1 tablet (5 mg total) by mouth every 6 (six) hours as needed for pain.    SENSIPAR 30 MG tablet    Generic drug: cinacalcet    Take 30 mg by mouth 2 (two) times daily.    vancomycin 1 GM/200ML Soln    Commonly known as: VANCOCIN    Inject 200 mLs (1,000 mg total) into the vein every Monday, Wednesday, and Friday with hemodialysis.      Discharge Condition: Stable  Disposition: 03-Skilled Nursing Facility  Consults:  Nephrology  Surgery  Significant Diagnostic Studies:  Dg Chest Port 1 View  01/29/2013 *RADIOLOGY REPORT* Clinical Data: Sepsis, hypotension, unresponsive, history hypertension, diabetes, end-stage  renal disease on dialysis PORTABLE CHEST - 1 VIEW Comparison: Portable exam 0055 hours compared to 12/27/2012 Findings: Upper normal heart size. Atherosclerotic calcification aorta. Stable mediastinal contours and pulmonary vascularity. Bronchitic changes with minimal right basilar atelectasis. No definite acute infiltrate, pleural effusion or pneumothorax. Bilateral chronic rotator cuff tears and AC joint degenerative changes. Scattered endplate spur formation thoracic spine. IMPRESSION: Bronchitic changes with minimal right basilar atelectasis. Original Report Authenticated By: Ulyses Southward, M.D.  Microbiology:  No results found for this or any previous visit (from the past 240 hour(s)).  Labs:  Results for orders placed during the hospital encounter of 01/29/13 (from the past 48 hour(s))   VANCOMYCIN, RANDOM Status: None    Collection Time    02/14/13 7:25 AM   Result  Value  Range    Vancomycin Rm  20.3     Comment:      Random Vancomycin therapeutic     range is dependent on dosage and     time of specimen collection.     A peak range is 20.0-40.0 ug/mL     A trough range is 5.0-15.0 ug/mL       CBC  Status: Abnormal    Collection Time    02/14/13 7:26 AM   Result  Value  Range    WBC  13.0 (*)  4.0 - 10.5 K/uL    RBC  3.57 (*)  4.22 - 5.81 MIL/uL    Hemoglobin  9.5 (*)  13.0 - 17.0 g/dL    HCT  09.8 (*)  11.9 - 52.0 %    MCV  80.7  78.0 - 100.0 fL    MCH  26.6  26.0 - 34.0 pg    MCHC  33.0  30.0 - 36.0 g/dL    RDW  14.7 (*)  82.9 - 15.5 %    Platelets  547 (*)  150 - 400 K/uL   RENAL FUNCTION PANEL Status: Abnormal    Collection Time    02/14/13 7:26 AM   Result  Value  Range    Sodium  137  135 - 145 mEq/L    Potassium  4.3  3.5 - 5.1 mEq/L    Chloride  100  96 - 112 mEq/L    CO2  25  19 - 32 mEq/L    Glucose, Bld  102 (*)  70 - 99 mg/dL    BUN  58 (*)  6 - 23 mg/dL    Creatinine, Ser  5.62 (*)  0.50 - 1.35 mg/dL    Calcium  9.3  8.4 - 10.5 mg/dL    Phosphorus  4.5  2.3  - 4.6 mg/dL    Albumin  2.2 (*)  3.5 - 5.2 g/dL    GFR calc non Af Amer  9 (*)  >90 mL/min    GFR calc Af Amer  10 (*)  >90 mL/min    Comment:      The eGFR has been calculated     using the CKD EPI equation.     This calculation has not been     validated in all clinical     situations.     eGFR's persistently     <90 mL/min signify     possible Chronic Kidney Disease.    Interim summary/ hospital course so far:  79 y.o. male history of ESRD on hemodialysis who was recently admitted (5/19 - 01/10/2013) for sepsis secondary to C. difficile colitis was brought back to the ER from nursing home 6/21 after patient was found to be hypotensive. On arrival patient also was found to be febrile, tachycardic, and hemoglobin low from his baseline. Patient was started on empiric vancomycin and cefepime after the blood cultures were obtained, given fluid bolus and had RBC transfusion for hypotension in ED. He was admitted to SDU.  Patient's stage 4 sacral decubitus had a discharge on exam at presentation. Patient's family had a detailed discussion with ER physician Dr. Arnoldo Morale and they requested to continue with DO NOT RESUSCITATE status and no vasopressors. They discussed the possibility of moving towards comfort measures. Wound care and Infectious disease were consulted, blood cultures revealed MRSA bacteremia  Palliative Care consult was requested to establish GOC with family and per meeting, continue medical care. Patient was not able to tolerate hydrotherapy due to severe pain and the family wanted aggressive care with his wound, hence general surgery was also consulted. Patient underwent operative debridement in OR on 02/08/13 and wound vac was placed.  [facility] was declined by [facility] and his SNF at [facility] were not able to handle wound vac. SW has started search for new SNF.  Renal has been following for  ESRD, on HD MWF.  Consults;  Renal- Dr Arta Silence  Surgery- Dr Corliss Skains  Palliative medicine- signed off   Wound Care  Assessment/Plan:  Principal Problem:  MRSA Sepsis / leukocytosis  - suspected source of entry is sacral decubitus stage IV with osteomyelitis, hypotension improved, fevers resolved.  - blood cx both + for MRSA (2/2), negative repeat cultures 6/25  Infectious disease recommendations, stop date 8/8 for vanc and ancef  no need for TEE as patient is going to be treated  - presumed osteomyelitis: he will need to vancomycin x 6 wks in addition to gram negative (ancef) and flagyl  - c.difficile infection- resolved- giving flagyl , continue Flagyl for another week beyond antibiotics completion  - The patient is receiving antibiotics with hemodialysis, MWF and Flagyl.  Decubitus ulcer of sacral region, stage 4  - wound care consult performed - wounds are felt to be beyond conservative tx at this time - Palliative Care consult requested to establish GOC with family - plan to continue medical care, general surgery was consulted and bedside debridement done with good results- recommending hydrotherapy  - appreciate surgery input, postop day 7, wound VAC placed, change dressings MWF per WOC consult note  End stage renal disease  - cont dialysis  Anemia of chronic disease  - stable  Protein-calorie malnutrition, severe  - nutritional supplements  DM  CBG reasonably controlled at present time  Hx of HTN  Hx of CVA with suspected vascular dementia  Decubitus ulcer of R heel  Discharge Exam:  Blood pressure 109/62, pulse 95, temperature 98.6 F (37 C), temperature source Oral, resp. rate 18, height 6\' 6"  (1.981 m), weight 82.3 kg (181 lb 7 oz), SpO2 97.00%.  Neck: Neck supple. No tracheal deviation present.  Cardiovascular: Normal rate, regular rhythm, normal heart sounds and intact distal pulses.  Pulmonary/Chest: Effort normal and breath sounds normal. No respiratory distress.  Abdominal: Soft. Normal appearance and bowel sounds are normal. She exhibits no distension. There is no  tenderness.  Musculoskeletal: She exhibits no edema and no tenderness.  Neurological: She is alert. No cranial nerve deficit.

## 2013-02-16 NOTE — Progress Notes (Signed)
Repor was call to [place] at [facility].IV was d/c.Foley cath. Was leaving in.pt. Ready to be D/C.

## 2013-02-16 NOTE — Progress Notes (Signed)
Physical Therapy Wound Treatment Patient Details  Name: [name] MRN: [id number] Date of Birth: [birth year]  Today's Date: 02/16/2013 Time: 1345-1430 Time Calculation (min): 45 min  Subjective  Subjective: You were a little worse on my today, huh? Patient and Family Stated Goals: not stated Prior Treatments: pulsatile lavage, debridement  Pain Score:    Wound Assessment  Pressure Ulcer 12/27/12 Unstageable - Full thickness tissue loss in which the base of the ulcer is covered by slough (yellow, tan, gray, green or brown) and/or eschar (tan, brown or black) in the wound bed. (Active)  State of Healing Early/partial granulation 02/16/2013  4:29 PM  Site / Wound Assessment Yellow;Pink;Red 02/16/2013  4:29 PM  % Wound base Red or Granulating 75% 02/16/2013  4:29 PM  % Wound base Yellow 20% 02/16/2013  4:29 PM  % Wound base Black 0% 02/16/2013  4:29 PM  % Wound base Other (Comment) 5% 02/16/2013  4:29 PM  Peri-wound Assessment Erythema (blanchable) 02/13/2013  9:00 AM  Wound Length (cm) 17 cm 02/16/2013  4:29 PM  Wound Width (cm) 12 cm 02/16/2013  4:29 PM  Wound Depth (cm) 1.2 cm 02/16/2013  4:29 PM  Tunneling (cm) 0 02/02/2013 12:30 PM  Undermining (cm) 1 02/02/2013 12:30 PM  Margins Unattacted edges (unapproximated) 02/16/2013  4:29 PM  Drainage Amount Other (Comment) 02/16/2013  4:29 PM  Drainage Description Serosanguineous 02/16/2013  4:29 PM  Treatment Negative pressure wound therapy;Hydrotherapy (Ultrasonic mist);Debridement (Selective) 02/16/2013  4:29 PM  Dressing Type Negative pressure wound therapy 02/16/2013  2:14 PM  Dressing Clean;Dry;Intact 02/16/2013  4:29 PM     Pressure Ulcer 01/29/13 Deep Tissue Injury - Purple or maroon localized area of discolored intact skin or blood-filled blister due to damage of underlying soft tissue from pressure and/or shear. Black and purple. Closed  (Active)  State of Healing Other (Comment) 02/13/2013  9:00 AM  Site / Wound Assessment Clean;Dry;Black 02/13/2013  9:00 AM  %  Wound base Red or Granulating 60% 02/02/2013 12:30 PM  % Wound base Yellow 0% 02/02/2013 12:30 PM  % Wound base Black 40% 02/02/2013 12:30 PM  Peri-wound Assessment Edema 02/14/2013  9:30 PM  Wound Length (cm) 3 cm 02/09/2013  4:32 PM  Wound Width (cm) 2 cm 02/09/2013  4:32 PM  Wound Depth (cm) 0 cm 02/09/2013  4:32 PM  Tunneling (cm) 0 02/02/2013 12:30 PM  Undermining (cm) 0 02/02/2013 12:30 PM  Margins Unattacted edges (unapproximated) 02/13/2013  9:00 AM  Drainage Amount Scant 02/13/2013  9:00 AM  Drainage Description Purulent 02/13/2013  9:00 AM  Treatment Off loading 02/13/2013  9:00 AM  Dressing Type Foam 02/16/2013  2:14 PM  Dressing Clean;Dry;Intact 02/16/2013  2:14 PM     Pressure Ulcer 02/03/13 Deep Tissue Injury - Purple or maroon localized area of discolored intact skin or blood-filled blister due to damage of underlying soft tissue from pressure and/or shear. (Active)  State of Healing Epithelialized 02/13/2013  9:00 AM  Site / Wound Assessment Black 02/13/2013  9:00 AM  Peri-wound Assessment Intact 02/13/2013  9:00 AM  Wound Length (cm) 2.5 cm 02/09/2013  4:32 PM  Wound Width (cm) 4 cm 02/09/2013  4:32 PM  Wound Depth (cm) 0 cm 02/09/2013  4:32 PM  Drainage Amount Minimal 02/13/2013  9:00 AM  Drainage Description Purulent 02/13/2013  9:00 AM  Treatment Off loading;Other (Comment) 02/13/2013  9:00 AM  Dressing Type Foam 02/16/2013  2:14 PM  Dressing Clean;Dry;Intact 02/16/2013  2:14 PM     Negative Pressure Wound  Therapy Sacrum Mid;Upper (Active)  Last dressing change 02/14/13 02/15/2013  9:25 PM  Site / Wound Assessment Other (Comment) 02/14/2013  9:30 PM  Peri-wound Assessment Intact;Purple 02/14/2013  9:30 PM  Cycle Continuous 02/14/2013  9:30 PM  Target Pressure (mmHg) 125 02/14/2013  9:30 PM  Dressing Status Intact 02/15/2013  9:25 PM  Drainage Amount Minimal 02/15/2013  9:25 PM  Drainage Description Serosanguineous 02/15/2013  9:25 PM  Output (mL) 10 mL 02/13/2013  7:00 PM     Wound 12/27/12 Other (Comment) Heel Right  (Active)  Site / Wound Assessment Black;Granulation tissue 02/09/2013  4:32 PM  % Wound base Red or Granulating 0% 02/02/2013 12:30 PM  % Wound base Yellow 25% 02/02/2013 12:30 PM  % Wound base Black 65% 02/02/2013 12:30 PM  % Wound base Other (Comment) 0% 02/02/2013 12:30 PM  Peri-wound Assessment Purple;Maceration 02/09/2013  4:32 PM  Wound Length (cm) 4 cm 02/09/2013  4:32 PM  Wound Width (cm) 7.5 cm 02/09/2013  4:32 PM  Wound Depth (cm) 0 cm 02/09/2013  4:32 PM  Tunneling (cm) 0 02/02/2013 12:30 PM  Undermining (cm) 0 02/02/2013 12:30 PM  Margins Unattacted edges (unapproximated) 02/02/2013 12:30 PM  Closure None 02/02/2013 12:30 PM  Drainage Amount Minimal 02/09/2013  4:32 PM  Treatment Cleansed 02/02/2013 12:30 PM  Dressing Type Foam 02/09/2013  4:32 PM  Dressing Changed Changed 02/09/2013  4:32 PM  Dressing Status Clean;Dry;Intact 02/09/2013  4:32 PM     Wound 12/27/12 Other (Comment) Toe (Comment  which one) Right (Active)  Site / Wound Assessment Clean;Dry 02/15/2013  8:55 AM  % Wound base Red or Granulating 0% 02/02/2013 12:30 PM  % Wound base Yellow 0% 02/02/2013 12:30 PM  % Wound base Black 100% 02/15/2013  8:55 AM  % Wound base Other (Comment) 0% 02/02/2013 12:30 PM  Peri-wound Assessment Intact 02/15/2013  8:55 AM  Wound Length (cm) 2 cm 02/02/2013 12:30 PM  Wound Width (cm) 1.5 cm 02/02/2013 12:30 PM  Wound Depth (cm) 1 cm 02/02/2013 12:30 PM  Tunneling (cm) 0.1 12/31/2012  8:39 AM  Undermining (cm) 0.1 12/31/2012  8:39 AM  Margins Attached edges (approximated) 02/15/2013  8:55 AM  Closure None 02/15/2013  8:55 AM  Drainage Amount None 02/15/2013  8:55 AM  Treatment Cleansed 02/12/2013  9:00 AM  Dressing Type None 02/15/2013  8:55 AM     Incision 04/13/12 Arm Left (Active)     Incision 02/08/13 Other (Comment) Other (Comment) (Active)  Site / Wound Assessment Clean;Dry 02/08/2013 10:27 PM  Drainage Amount Minimal 02/08/2013 10:27 PM  Drainage Description Serosanguineous 02/08/2013 10:27 PM  Dressing Type Negative  pressure wound therapy 02/08/2013 10:27 PM  Dressing Dry;Intact;Clean 02/08/2013 10:27 PM   Hydrotherapy Ultrasonic mist  - wound location: sacrum Ultrasonic mist at 35KHz (+/-3KHz) at ___ percent: 100 % Ultrasonic mist therapy minutes: 5 min Selective Debridement Selective Debridement - Location: sacrum Selective Debridement - Tools Used: Forceps Selective Debridement - Tissue Removed: yellow slough   Wound Assessment and Plan  Wound Therapy - Assess/Plan/Recommendations Wound Therapy - Clinical Statement: Pt with large sacral wound that can benefit from hydrotherapy to decr necrotic tissue and promote healing. Wound Therapy - Functional Problem List: immobility Factors Delaying/Impairing Wound Healing: Diabetes Mellitus;Altered sensation;Immobility Hydrotherapy Plan: Debridement;Dressing change;Patient/family education;Ultrasonic wound therapy @35  KHz (+/- 3 KHz) Wound Therapy - Frequency: 6X / week Wound Therapy - Follow Up Recommendations: Skilled nursing facility Wound Plan: decr. bacterial load and selectively debide to healthy tissues  Wound Therapy Goals- Improve the  function of patient's integumentary system by progressing the wound(s) through the phases of wound healing (inflammation - proliferation - remodeling) by: Decrease Necrotic Tissue to: 10 Decrease Necrotic Tissue - Progress: Progressing toward goal Increase Granulation Tissue to: 90 (incl. healthy CT) Increase Granulation Tissue - Progress: Progressing toward goal Goals/treatment plan/discharge plan were made with and agreed upon by patient/family: Yes Time For Goal Achievement: 2 weeks Wound Therapy - Potential for Goals: Good  Goals will be updated until maximal potential achieved or discharge criteria met.  Discharge criteria: when goals achieved, discharge from hospital, MD decision/surgical intervention, no progress towards goals, refusal/missing three consecutive treatments without notification or medical  reason.  GP     Maisyn Nouri, [name] 02/16/2013, 4:32 PM  02/16/2013  Chestnut Bing, PT [phone]  (pager)

## 2013-02-16 NOTE — Procedures (Signed)
Tolerating hemodialysis without hemodynamic issues currently.  SNF plans have been derailed for financial reasons. Will cont IHD support 3x/week. [name] C

## 2013-02-17 ENCOUNTER — Other Ambulatory Visit: Payer: Self-pay

## 2013-02-17 ENCOUNTER — Other Ambulatory Visit: Payer: Self-pay | Admitting: Geriatric Medicine

## 2013-02-17 ENCOUNTER — Encounter: Payer: Self-pay | Admitting: Internal Medicine

## 2013-02-17 ENCOUNTER — Non-Acute Institutional Stay (SKILLED_NURSING_FACILITY): Payer: Medicare Other | Admitting: Internal Medicine

## 2013-02-17 DIAGNOSIS — A0472 Enterocolitis due to Clostridium difficile, not specified as recurrent: Secondary | ICD-10-CM

## 2013-02-17 DIAGNOSIS — E43 Unspecified severe protein-calorie malnutrition: Secondary | ICD-10-CM

## 2013-02-17 DIAGNOSIS — Z8673 Personal history of transient ischemic attack (TIA), and cerebral infarction without residual deficits: Secondary | ICD-10-CM

## 2013-02-17 DIAGNOSIS — E41 Nutritional marasmus: Secondary | ICD-10-CM

## 2013-02-17 DIAGNOSIS — A419 Sepsis, unspecified organism: Secondary | ICD-10-CM

## 2013-02-17 DIAGNOSIS — L89154 Pressure ulcer of sacral region, stage 4: Secondary | ICD-10-CM

## 2013-02-17 DIAGNOSIS — L89109 Pressure ulcer of unspecified part of back, unspecified stage: Secondary | ICD-10-CM

## 2013-02-17 DIAGNOSIS — D638 Anemia in other chronic diseases classified elsewhere: Secondary | ICD-10-CM

## 2013-02-17 DIAGNOSIS — N186 End stage renal disease: Secondary | ICD-10-CM

## 2013-02-17 DIAGNOSIS — L8994 Pressure ulcer of unspecified site, stage 4: Secondary | ICD-10-CM

## 2013-02-17 MED ORDER — OXYCODONE HCL 5 MG PO TABS: 5.0000 mg | ORAL_TABLET | Freq: Four times a day (QID) | ORAL | Status: DC | PRN

## 2013-02-17 NOTE — Progress Notes (Signed)
Patient ID: [name], male   DOB: 11/06/1930, 79 y.o.   MRN: [id number]    PCP: Crawford Givens, MD  Code Status: DNR  No Known Allergies  Chief Complaint  Patient presents with  . Hospitalization Follow-up    HPI:  79 y/o male patient with history of ESRD on hemodialysis 3 days a week and recent hospital admission for c.diff colitis was in [place] for STR while he got readmitted with sepsis. He received blood transfusion and fluid boluses along with broad spectrum antibiotics. He also had a stage 4 sacral wound. Wound care and Infectious disease were consulted, blood cultures revealed MRSA bacteremia. With comfort care being the main goals of care, palliative care team was consulted. Patient was not able to tolerate hydrotherapy due to severe pain and the family wanted aggressive care with his wound, hence general surgery was also consulted. Patient underwent operative debridement in OR on 02/08/13 and wound vac was placed. He was in the hospital from 01/29/13- 02/16/13 He was seen in his room today. He denies any pain at present. Says he is comfortable. Appetite is good  Review of Systems:  Constitutional: Negative for malaise/fatigue.  Respiratory: Negative for cough and shortness of breath.   Cardiovascular: Negative for chest pain.  Gastrointestinal: Negative for abdominal pain, diarrhea and constipation.  Genitourinary: Negative for dysuria, urgency and frequency.  Musculoskeletal: Positive for pain Psychiatric/Behavioral: Positive for memory loss.   CONSULTS Renal- Dr Arta Silence   Surgery- Dr Corliss Skains   Palliative medicine- signed off   Wound Care     Past Medical History  Diagnosis Date  . Hypertension   . Diabetes mellitus     typeII / No meds  . Cataract 01/2002    Right  . Dialysis patient 10/13/2007  . Radiculopathy 05/14/2006    NCV study negative carpal tunnel, Left C5 radiculopathy  . Arthritis     Knee pain  . ESRD (end stage renal disease) on dialysis 2009   [facility] diaylsis Monday , Wed. and Friday per Dr. Kathrene Bongo  . Stroke   . Elevated PSA     h/o, pt had declined further eval.   . Syncope 11/02/2012   Past Surgical History  Procedure Laterality Date  . Varicose vein surgery  1978  . External fixation wrist fracture  1990  . Thyroidectomy, partial  08/03/2003    Right thyroid lobectomy, subtotal parathyr. sec hyperparthyroidismadenosis  . Av fistula placement  07/2004    Left arm AV fistula placement [name] via [place])  . Eye surgery      Cataract  . Wound debridement N/A 02/08/2013    Procedure: DEBRIDEMENT WOUND;  Surgeon: Wilmon Arms. Corliss Skains, MD;  Location: [facility] OR;  Service: General;  Laterality: N/A;   Social History:   reports that he quit smoking about 10 years ago. His smoking use included Cigarettes. He smoked 0.00 packs per day for 10 years. He has never used smokeless tobacco. He reports that he does not drink alcohol or use illicit drugs.  Family History  Problem Relation Age of Onset  . Cancer Father 53    Lung Cancer//? stomach cancer  . Cancer Sister 53    brain tumor    Medications: Patient's Medications  New Prescriptions   No medications on file  Previous Medications   CEFAZOLIN (ANCEF) 2-3 GM-% SOLR    Inject 50 mLs (2 g total) into the vein every Monday, Wednesday, and Friday with hemodialysis.   CINACALCET (SENSIPAR) 30 MG TABLET  Take 30 mg by mouth 2 (two) times daily.    DIPYRIDAMOLE-ASPIRIN (AGGRENOX) 200-25 MG PER 12 HR CAPSULE    Take 1 capsule by mouth 2 (two) times daily.   GABAPENTIN (NEURONTIN) 100 MG CAPSULE    Take 100 mg by mouth daily.   LANTHANUM (FOSRENOL) 500 MG CHEWABLE TABLET    Chew 1 tablet (500 mg total) by mouth 3 (three) times daily with meals.   METRONIDAZOLE (FLAGYL) 500 MG TABLET    Take 1 tablet (500 mg total) by mouth every 8 (eight) hours.   MULTIVITAMIN (RENA-VIT) TABS TABLET    Take 1 tablet by mouth daily.     NUTRITIONAL SUPPLEMENTS (FEEDING SUPPLEMENT, NEPRO  CARB STEADY,) LIQD    Take 237 mLs by mouth 2 (two) times daily between meals.   OMEGA-3 FATTY ACIDS (FISH OIL) 1000 MG CAPS    Take 1 capsule by mouth daily.     OXYCODONE (OXY IR/ROXICODONE) 5 MG IMMEDIATE RELEASE TABLET    Take 1 tablet (5 mg total) by mouth every 6 (six) hours as needed for pain.   VANCOMYCIN (VANCOCIN) 1 GM/200ML SOLN    Inject 200 mLs (1,000 mg total) into the vein every Monday, Wednesday, and Friday with hemodialysis.  Modified Medications   No medications on file  Discontinued Medications   No medications on file    Physical Exam:  Filed Vitals:   02/17/13 1023  BP: 126/60  Pulse: 90  Temp: 97.3 F (36.3 C)  Resp: 18   Constitutional: He appears frail, skinny No distress.  HENT:   Head: Normocephalic and atraumatic.  Neck: Normal range of motion. Neck supple. No thyromegaly present.  Cardiovascular: Normal rate, regular rhythm and normal heart sounds.   Pulmonary/Chest: Effort normal.  diminished breath sounds throughout  Musculoskeletal: He exhibits tenderness in sacral area. He exhibits no edema.  Abdomen: scaphoid abdomen, normal bowel sounds Lymphadenopathy:    He has no cervical adenopathy.  Neurological: He is alert.  Skin: Skin is warm and dry. He is not diaphoretic. Stage 4 in sacral area and wound vac present. AV fistula site clean. Foley in place     Labs reviewed: Basic Metabolic Panel:  Recent Labs  16/10/96 0118 01/29/13 0130  02/06/13 0605  02/11/13 1024 02/14/13 0726 02/16/13 0744  NA 145  --   < > 134*  < > 135 137 134*  K 2.8*  --   < > 4.3  < > 4.0 4.3 3.7  CL 112  --   < > 96  < > 98 100 97  CO2  --   --   < > 28  < > 27 25 28   GLUCOSE 72  --   < > 94  < > 125* 102* 98  BUN 23  --   < > 47*  < > 38* 58* 54*  CREATININE 2.70*  --   < > 5.35*  < > 4.65* 5.62* 5.03*  CALCIUM  --   --   < > 8.8  < > 8.7 9.3 9.3  MG  --  1.4*  --   --   --   --   --   --   PHOS  --   --   < > 4.6  --  3.2 4.5  --   < > = values in this  interval not displayed. Liver Function Tests:  Recent Labs  12/28/12 0450  01/29/13 0015 01/29/13 0848  02/06/13 0605 02/11/13 1024 02/14/13  0726  AST 27  --  31 45*  --   --   --   --   ALT 27  --  25 35  --   --   --   --   ALKPHOS 60  --  43 71  --   --   --   --   BILITOT 0.3  --  0.2* 0.4  --   --   --   --   PROT 6.5  --  5.1* 7.4  --   --   --   --   ALBUMIN 1.7*  < > 1.4* 2.0*  < > 1.9* 2.0* 2.2*  < > = values in this interval not displayed.  Recent Labs  01/29/13 0015  LIPASE 41   No results found for this basename: AMMONIA,  in the last 8760 hours CBC:  Recent Labs  12/28/12 0428  01/29/13 0015  01/29/13 0848  02/11/13 1024 02/14/13 0726 02/16/13 0744  WBC 20.6*  < > 18.7*  --  25.7*  < > 13.3* 13.0* 9.9  NEUTROABS 17.8*  --  16.8*  --  21.2*  --   --   --   --   HGB 6.7*  < > 6.4*  < > 10.1*  < > 9.0* 9.5* 9.4*  HCT 20.1*  < > 19.2*  < > 31.4*  < > 27.6* 28.8* 28.8*  MCV 83.4  < > 83.1  --  84.4  < > 82.1 80.7 80.9  PLT 449*  < > 382  --  460*  < > 452* 547* 465*  < > = values in this interval not displayed. Cardiac Enzymes:  Recent Labs  11/03/12 2309 11/04/12 0457 12/27/12 2305  TROPONINI <0.30 <0.30 <0.30   CBG:  Recent Labs  01/29/13 0217 02/02/13 1228 02/08/13 1746  GLUCAP 124* 88 90    Radiological Exams:  Significant Diagnostic Studies:   Dg Chest Port 1 View   01/29/2013 *RADIOLOGY REPORT* Clinical Data: Sepsis, hypotension, unresponsive, history hypertension, diabetes, end-stage renal disease on dialysis PORTABLE CHEST - 1 VIEW Comparison: Portable exam 0055 hours compared to 12/27/2012 Findings: Upper normal heart size. Atherosclerotic calcification aorta. Stable mediastinal contours and pulmonary vascularity. Bronchitic changes with minimal right basilar atelectasis. No definite acute infiltrate, pleural effusion or pneumothorax. Bilateral chronic rotator cuff tears and AC joint degenerative changes. Scattered endplate spur  formation thoracic spine. IMPRESSION: Bronchitic changes with minimal right basilar atelectasis. Original Report Authenticated By: Ulyses Southward, M.D.     Assessment/Plan  Sepsis To complete course of vancomycin and ancef for now, to get them with dialysis. Remains afebrile. Monitor clinically. Is weak and to work with therpay team for strengthening exercise  C.diff colitis to complete course of metronidazole. No further loose stool  Decubitus ulcer of sacral region stage 4 S/p debridement and has wound vac in place. Continue wound care. Poor prognosis given his malnourished state and bacteremia. Continue gabapentin for pain with roxicodone, under control at present. Continue foley care  Protein calorie malnutrition Continue nutritional supplements, wound care. Encourage po intake  CVA Continue aggrenox. bp under control  END STAGE RENAL DISEASE ESRD on dialysis M,W,F. Also continue sensipar and fosrenol   Anemia of chronic disease Recent blood transfusion, monitor h/h  Diabetes mellitus with end stage renal disease Not currently on medications A1c was previously 5.4. monitor   Labs/tests ordered- cbc, cmp

## 2013-02-18 ENCOUNTER — Telehealth: Payer: Self-pay | Admitting: Family Medicine

## 2013-02-18 ENCOUNTER — Other Ambulatory Visit: Payer: Self-pay | Admitting: Geriatric Medicine

## 2013-02-18 MED ORDER — OXYCODONE HCL 5 MG PO TABS: 5.0000 mg | ORAL_TABLET | Freq: Four times a day (QID) | ORAL | Status: DC | PRN

## 2013-02-18 NOTE — Telephone Encounter (Signed)
I called the patient's son to offer my support and concern.  He thanked me for the call.

## 2013-04-14 ENCOUNTER — Other Ambulatory Visit: Payer: Self-pay | Admitting: *Deleted

## 2013-04-14 MED ORDER — OXYCODONE HCL 5 MG PO TABS: ORAL_TABLET | ORAL | Status: DC

## 2013-04-19 ENCOUNTER — Other Ambulatory Visit: Payer: Self-pay | Admitting: *Deleted

## 2013-04-19 ENCOUNTER — Non-Acute Institutional Stay (SKILLED_NURSING_FACILITY): Payer: Medicare Other | Admitting: Internal Medicine

## 2013-04-19 DIAGNOSIS — Z8673 Personal history of transient ischemic attack (TIA), and cerebral infarction without residual deficits: Secondary | ICD-10-CM

## 2013-04-19 DIAGNOSIS — E1129 Type 2 diabetes mellitus with other diabetic kidney complication: Secondary | ICD-10-CM

## 2013-04-19 DIAGNOSIS — E43 Unspecified severe protein-calorie malnutrition: Secondary | ICD-10-CM

## 2013-04-19 DIAGNOSIS — N186 End stage renal disease: Secondary | ICD-10-CM

## 2013-04-19 DIAGNOSIS — L89154 Pressure ulcer of sacral region, stage 4: Secondary | ICD-10-CM

## 2013-04-19 DIAGNOSIS — E1122 Type 2 diabetes mellitus with diabetic chronic kidney disease: Secondary | ICD-10-CM

## 2013-04-19 DIAGNOSIS — A419 Sepsis, unspecified organism: Secondary | ICD-10-CM

## 2013-04-19 DIAGNOSIS — A0472 Enterocolitis due to Clostridium difficile, not specified as recurrent: Secondary | ICD-10-CM

## 2013-04-19 DIAGNOSIS — L89109 Pressure ulcer of unspecified part of back, unspecified stage: Secondary | ICD-10-CM

## 2013-04-19 DIAGNOSIS — D638 Anemia in other chronic diseases classified elsewhere: Secondary | ICD-10-CM

## 2013-04-19 DIAGNOSIS — E41 Nutritional marasmus: Secondary | ICD-10-CM

## 2013-04-19 DIAGNOSIS — L8994 Pressure ulcer of unspecified site, stage 4: Secondary | ICD-10-CM

## 2013-04-19 MED ORDER — GLUCOSE BLOOD VI STRP: ORAL_STRIP | Status: DC

## 2013-04-19 NOTE — Telephone Encounter (Signed)
Sent.  Use daily to check sugar.  250.40.  #100, 3rf.  Thanks.

## 2013-04-19 NOTE — Progress Notes (Signed)
Patient ID: [name], male   DOB: May 28, 1931, 79 y.o.   MRN: [id number] Location:  [facility] Provider:  Elmarie Shiley L. Renato Gails, D.O., C.M.D.  Code Status: DNR  Chief Complaint  Patient presents with  . Medical Managment of Chronic Issues    HPI:  79 yo male with a h/o stroke, ESRD was seen for medical mgt of chronic diseases.  He is here for long term care.  He is a dialysis pt who was hospitalized in late June to early July with sepsis.  He had a stage IV sacral decubitus ulcer and developed C diff colitis.    Review of Systems:  Review of Systems  Constitutional: Negative for fever.  HENT: Negative for congestion.   Eyes: Negative for blurred vision.  Respiratory: Positive for shortness of breath.   Cardiovascular: Negative for chest pain.  Gastrointestinal: Negative for abdominal pain.  Musculoskeletal: Negative for myalgias.  Skin: Negative for rash.  Neurological: Negative for dizziness.  Endo/Heme/Allergies: Bruises/bleeds easily.    Medications: Patient's Medications  New Prescriptions   No medications on file  Previous Medications   CINACALCET (SENSIPAR) 30 MG TABLET    Take 30 mg by mouth 2 (two) times daily.    DIPYRIDAMOLE-ASPIRIN (AGGRENOX) 200-25 MG PER 12 HR CAPSULE    Take 1 capsule by mouth 2 (two) times daily.   GABAPENTIN (NEURONTIN) 100 MG CAPSULE    Take 100 mg by mouth daily.   GLUCOSE BLOOD TEST STRIP    Glucocard Expression Strips   LANTHANUM (FOSRENOL) 500 MG CHEWABLE TABLET    Chew 1 tablet (500 mg total) by mouth 3 (three) times daily with meals.   MULTIVITAMIN (RENA-VIT) TABS TABLET    Take 1 tablet by mouth daily.     NUTRITIONAL SUPPLEMENTS (FEEDING SUPPLEMENT, NEPRO CARB STEADY,) LIQD    Take 237 mLs by mouth 2 (two) times daily between meals.   OMEGA-3 FATTY ACIDS (FISH OIL) 1000 MG CAPS    Take 1 capsule by mouth daily.     OXYCODONE (OXY IR/ROXICODONE) 5 MG IMMEDIATE RELEASE TABLET    Take one tablet by mouth every 6 hours as  needed for pain  Modified Medications   No medications on file  Discontinued Medications   No medications on file    Physical Exam: There were no vitals filed for this visit. Physical Exam  Constitutional: No distress.  Cardiovascular: Normal rate, regular rhythm and normal heart sounds.   Pulmonary/Chest: Effort normal and breath sounds normal. No respiratory distress.  Abdominal: Soft. Bowel sounds are normal. He exhibits no distension and no mass. There is no tenderness.  Musculoskeletal: He exhibits no edema and no tenderness.  Neurological: He is alert.  Skin:  Bilateral feet with blisters wrapped in kerlix, left calf with draining ulcer     Labs reviewed: Basic Metabolic Panel:  Recent Labs  40/98/11 0118 01/29/13 0130  02/06/13 0605  02/11/13 1024 02/14/13 0726 02/16/13 0744  NA 145  --   < > 134*  < > 135 137 134*  K 2.8*  --   < > 4.3  < > 4.0 4.3 3.7  CL 112  --   < > 96  < > 98 100 97  CO2  --   --   < > 28  < > 27 25 28   GLUCOSE 72  --   < > 94  < > 125* 102* 98  BUN 23  --   < > 47*  < >  38* 58* 54*  CREATININE 2.70*  --   < > 5.35*  < > 4.65* 5.62* 5.03*  CALCIUM  --   --   < > 8.8  < > 8.7 9.3 9.3  MG  --  1.4*  --   --   --   --   --   --   PHOS  --   --   < > 4.6  --  3.2 4.5  --   < > = values in this interval not displayed.  Liver Function Tests:  Recent Labs  12/28/12 0450  01/29/13 0015 01/29/13 0848  02/06/13 0605 02/11/13 1024 02/14/13 0726  AST 27  --  31 45*  --   --   --   --   ALT 27  --  25 35  --   --   --   --   ALKPHOS 60  --  43 71  --   --   --   --   BILITOT 0.3  --  0.2* 0.4  --   --   --   --   PROT 6.5  --  5.1* 7.4  --   --   --   --   ALBUMIN 1.7*  < > 1.4* 2.0*  < > 1.9* 2.0* 2.2*  < > = values in this interval not displayed.  CBC:  Recent Labs  12/28/12 0428  01/29/13 0015  01/29/13 0848  02/11/13 1024 02/14/13 0726 02/16/13 0744  WBC 20.6*  < > 18.7*  --  25.7*  < > 13.3* 13.0* 9.9  NEUTROABS 17.8*  --   16.8*  --  21.2*  --   --   --   --   HGB 6.7*  < > 6.4*  < > 10.1*  < > 9.0* 9.5* 9.4*  HCT 20.1*  < > 19.2*  < > 31.4*  < > 27.6* 28.8* 28.8*  MCV 83.4  < > 83.1  --  84.4  < > 82.1 80.7 80.9  PLT 449*  < > 382  --  460*  < > 452* 547* 465*  < > = values in this interval not displayed. 03/18/13:  CXR:  No lobar pneumonia or overt CHF 03/20/13:  UA c+s with yeast 03/21/13:  Wbc 10.5, h/h 9.1/28.1, plts 530  Significant Diagnostic Results:  03/22/13:  ABIs with bilateral lower extremity arterial ultrasound study:  Mild arterial occlusive disease at both legs distal to the popliteal arteries  Assessment/Plan Sepsis  completed course of vancomycin and ancef  with dialysis. Remains afebrile. Slow improvement  C.diff colitis  Completed course of metronidazole. No further loose stool   Decubitus ulcer of sacral region stage 4  S/p debridement and wound vac. Continue wound care. Poor prognosis given his malnourished state and h/o bacteremia. Continue gabapentin for pain with roxicodone, under control at present.   Protein calorie malnutrition  Continue nutritional supplements, wound care. Encourage po intake   CVA  Continue aggrenox. bp under control   ESRD on dialysis M,W,F. Also continue sensipar and fosrenol   Anemia of chronic disease  Recent blood transfusion, monitor h/h   Diabetes mellitus with end stage renal disease  Not currently on medications A1c was previously 5.4. monitor

## 2013-04-19 NOTE — Telephone Encounter (Signed)
Faxed refill request. I will need directions, diagnosis and quantity.  Please advise. (Faxed request had copy is available if needed)

## 2013-05-11 ENCOUNTER — Encounter (HOSPITAL_COMMUNITY): Payer: Self-pay | Admitting: Emergency Medicine

## 2013-05-11 ENCOUNTER — Inpatient Hospital Stay (HOSPITAL_COMMUNITY)
Admission: EM | Admit: 2013-05-11 | Discharge: 2013-05-16 | DRG: 871 | Disposition: A | Discharge status: Skilled Nursing Facility | Payer: Medicare Other | Attending: Internal Medicine | Admitting: Internal Medicine

## 2013-05-11 ENCOUNTER — Emergency Department (HOSPITAL_COMMUNITY): Payer: Medicare Other

## 2013-05-11 DIAGNOSIS — Z8673 Personal history of transient ischemic attack (TIA), and cerebral infarction without residual deficits: Secondary | ICD-10-CM

## 2013-05-11 DIAGNOSIS — E559 Vitamin D deficiency, unspecified: Secondary | ICD-10-CM

## 2013-05-11 DIAGNOSIS — E860 Dehydration: Secondary | ICD-10-CM

## 2013-05-11 DIAGNOSIS — R55 Syncope and collapse: Secondary | ICD-10-CM

## 2013-05-11 DIAGNOSIS — R4182 Altered mental status, unspecified: Secondary | ICD-10-CM | POA: Diagnosis present

## 2013-05-11 DIAGNOSIS — D631 Anemia in chronic kidney disease: Secondary | ICD-10-CM

## 2013-05-11 DIAGNOSIS — T82898A Other specified complication of vascular prosthetic devices, implants and grafts, initial encounter: Secondary | ICD-10-CM

## 2013-05-11 DIAGNOSIS — A4902 Methicillin resistant Staphylococcus aureus infection, unspecified site: Secondary | ICD-10-CM | POA: Diagnosis present

## 2013-05-11 DIAGNOSIS — E43 Unspecified severe protein-calorie malnutrition: Secondary | ICD-10-CM

## 2013-05-11 DIAGNOSIS — Z681 Body mass index (BMI) 19 or less, adult: Secondary | ICD-10-CM

## 2013-05-11 DIAGNOSIS — L89109 Pressure ulcer of unspecified part of back, unspecified stage: Secondary | ICD-10-CM

## 2013-05-11 DIAGNOSIS — E119 Type 2 diabetes mellitus without complications: Secondary | ICD-10-CM | POA: Diagnosis present

## 2013-05-11 DIAGNOSIS — L89154 Pressure ulcer of sacral region, stage 4: Secondary | ICD-10-CM

## 2013-05-11 DIAGNOSIS — F0390 Unspecified dementia without behavioral disturbance: Secondary | ICD-10-CM | POA: Diagnosis present

## 2013-05-11 DIAGNOSIS — N2581 Secondary hyperparathyroidism of renal origin: Secondary | ICD-10-CM | POA: Diagnosis present

## 2013-05-11 DIAGNOSIS — N390 Urinary tract infection, site not specified: Secondary | ICD-10-CM

## 2013-05-11 DIAGNOSIS — E876 Hypokalemia: Secondary | ICD-10-CM | POA: Diagnosis not present

## 2013-05-11 DIAGNOSIS — J9601 Acute respiratory failure with hypoxia: Secondary | ICD-10-CM

## 2013-05-11 DIAGNOSIS — L8994 Pressure ulcer of unspecified site, stage 4: Secondary | ICD-10-CM

## 2013-05-11 DIAGNOSIS — J9600 Acute respiratory failure, unspecified whether with hypoxia or hypercapnia: Secondary | ICD-10-CM | POA: Diagnosis not present

## 2013-05-11 DIAGNOSIS — Z66 Do not resuscitate: Secondary | ICD-10-CM | POA: Diagnosis present

## 2013-05-11 DIAGNOSIS — Z87891 Personal history of nicotine dependence: Secondary | ICD-10-CM

## 2013-05-11 DIAGNOSIS — L89600 Pressure ulcer of unspecified heel, unstageable: Secondary | ICD-10-CM | POA: Diagnosis present

## 2013-05-11 DIAGNOSIS — L89609 Pressure ulcer of unspecified heel, unspecified stage: Secondary | ICD-10-CM | POA: Diagnosis present

## 2013-05-11 DIAGNOSIS — Z992 Dependence on renal dialysis: Secondary | ICD-10-CM

## 2013-05-11 DIAGNOSIS — A419 Sepsis, unspecified organism: Principal | ICD-10-CM

## 2013-05-11 DIAGNOSIS — E1122 Type 2 diabetes mellitus with diabetic chronic kidney disease: Secondary | ICD-10-CM

## 2013-05-11 DIAGNOSIS — I10 Essential (primary) hypertension: Secondary | ICD-10-CM

## 2013-05-11 DIAGNOSIS — R972 Elevated prostate specific antigen [PSA]: Secondary | ICD-10-CM

## 2013-05-11 DIAGNOSIS — I120 Hypertensive chronic kidney disease with stage 5 chronic kidney disease or end stage renal disease: Secondary | ICD-10-CM | POA: Diagnosis present

## 2013-05-11 DIAGNOSIS — R8290 Unspecified abnormal findings in urine: Secondary | ICD-10-CM

## 2013-05-11 DIAGNOSIS — M1712 Unilateral primary osteoarthritis, left knee: Secondary | ICD-10-CM

## 2013-05-11 DIAGNOSIS — N186 End stage renal disease: Secondary | ICD-10-CM

## 2013-05-11 DIAGNOSIS — R509 Fever, unspecified: Secondary | ICD-10-CM

## 2013-05-11 DIAGNOSIS — Z8614 Personal history of Methicillin resistant Staphylococcus aureus infection: Secondary | ICD-10-CM

## 2013-05-11 DIAGNOSIS — Z515 Encounter for palliative care: Secondary | ICD-10-CM

## 2013-05-11 DIAGNOSIS — E1149 Type 2 diabetes mellitus with other diabetic neurological complication: Secondary | ICD-10-CM

## 2013-05-11 HISTORY — DX: Urinary tract infection, site not specified: N39.0

## 2013-05-11 HISTORY — DX: Sepsis, unspecified organism: A41.9

## 2013-05-11 HISTORY — DX: Unspecified dementia, unspecified severity, without behavioral disturbance, psychotic disturbance, mood disturbance, and anxiety: F03.90

## 2013-05-11 LAB — CBC WITH DIFFERENTIAL/PLATELET
Basophils Relative: 1 % (ref 0–1)
Hemoglobin: 11.9 g/dL — ABNORMAL LOW (ref 13.0–17.0)
Lymphocytes Relative: 3 % — ABNORMAL LOW (ref 12–46)
Lymphs Abs: 0.5 10*3/uL — ABNORMAL LOW (ref 0.7–4.0)
Monocytes Relative: 6 % (ref 3–12)
Neutro Abs: 14.9 10*3/uL — ABNORMAL HIGH (ref 1.7–7.7)
Neutrophils Relative %: 90 % — ABNORMAL HIGH (ref 43–77)
RBC: 4.76 MIL/uL (ref 4.22–5.81)
WBC: 16.4 10*3/uL — ABNORMAL HIGH (ref 4.0–10.5)

## 2013-05-11 LAB — COMPREHENSIVE METABOLIC PANEL
Albumin: 2.5 g/dL — ABNORMAL LOW (ref 3.5–5.2)
Alkaline Phosphatase: 83 U/L (ref 39–117)
BUN: 69 mg/dL — ABNORMAL HIGH (ref 6–23)
Chloride: 89 mEq/L — ABNORMAL LOW (ref 96–112)
Glucose, Bld: 133 mg/dL — ABNORMAL HIGH (ref 70–99)
Potassium: 5.5 mEq/L — ABNORMAL HIGH (ref 3.5–5.1)
Total Bilirubin: 0.4 mg/dL (ref 0.3–1.2)

## 2013-05-11 LAB — GLUCOSE, CAPILLARY

## 2013-05-11 LAB — RENAL FUNCTION PANEL
BUN: 68 mg/dL — ABNORMAL HIGH (ref 6–23)
CO2: 25 mEq/L (ref 19–32)
Chloride: 94 mEq/L — ABNORMAL LOW (ref 96–112)
GFR calc Af Amer: 9 mL/min — ABNORMAL LOW (ref >90)
GFR calc non Af Amer: 8 mL/min — ABNORMAL LOW (ref >90)
Glucose, Bld: 126 mg/dL — ABNORMAL HIGH (ref 70–99)
Phosphorus: 6.3 mg/dL — ABNORMAL HIGH (ref 2.3–4.6)
Potassium: 5.7 mEq/L — ABNORMAL HIGH (ref 3.5–5.1)
Sodium: 133 mEq/L — ABNORMAL LOW (ref 135–145)

## 2013-05-11 LAB — CG4 I-STAT (LACTIC ACID): Lactic Acid, Venous: 3.19 mmol/L — ABNORMAL HIGH (ref 0.5–2.2)

## 2013-05-11 MED ORDER — ASPIRIN-DIPYRIDAMOLE ER 25-200 MG PO CP12
1.0000 | ORAL_CAPSULE | Freq: Two times a day (BID) | ORAL | Status: DC
  Administered 2013-05-12 – 2013-05-16 (×8): 1 via ORAL
  Filled 2013-05-11 (×11): qty 1

## 2013-05-11 MED ORDER — VANCOMYCIN HCL IN DEXTROSE 1-5 GM/200ML-% IV SOLN
1000.0000 mg | INTRAVENOUS | Status: DC
Start: 2013-05-11 — End: 2013-05-13
  Administered 2013-05-11: 1000 mg via INTRAVENOUS
  Filled 2013-05-11 (×2): qty 200

## 2013-05-11 MED ORDER — HEPARIN SODIUM (PORCINE) 1000 UNIT/ML DIALYSIS
5000.0000 [IU] | Freq: Once | INTRAMUSCULAR | Status: DC
  Filled 2013-05-11: qty 5

## 2013-05-11 MED ORDER — ACETAMINOPHEN ER 650 MG PO TBCR: 650.0000 mg | EXTENDED_RELEASE_TABLET | ORAL | Status: DC | PRN

## 2013-05-11 MED ORDER — LIDOCAINE-PRILOCAINE 2.5-2.5 % EX CREA: 1.0000 "application " | TOPICAL_CREAM | CUTANEOUS | Status: DC | PRN

## 2013-05-11 MED ORDER — HEPARIN SODIUM (PORCINE) 5000 UNIT/ML IJ SOLN
5000.0000 [IU] | Freq: Three times a day (TID) | INTRAMUSCULAR | Status: DC
  Administered 2013-05-11 – 2013-05-16 (×14): 5000 [IU] via SUBCUTANEOUS
  Filled 2013-05-11 (×17): qty 1

## 2013-05-11 MED ORDER — VANCOMYCIN HCL 10 G IV SOLR
1750.0000 mg | INTRAVENOUS | Status: AC
  Administered 2013-05-11: 1750 mg via INTRAVENOUS
  Filled 2013-05-11: qty 1750

## 2013-05-11 MED ORDER — SODIUM CHLORIDE 0.9 % IV SOLN: 100.0000 mL | INTRAVENOUS | Status: DC | PRN

## 2013-05-11 MED ORDER — NEPRO/CARBSTEADY PO LIQD
237.0000 mL | ORAL | Status: DC | PRN
  Filled 2013-05-11: qty 237

## 2013-05-11 MED ORDER — HEPARIN SODIUM (PORCINE) 1000 UNIT/ML DIALYSIS: 1000.0000 [IU] | INTRAMUSCULAR | Status: DC | PRN

## 2013-05-11 MED ORDER — RENA-VITE PO TABS
1.0000 | ORAL_TABLET | Freq: Every day | ORAL | Status: DC
  Administered 2013-05-12 – 2013-05-15 (×4): 1 via ORAL
  Filled 2013-05-11 (×6): qty 1

## 2013-05-11 MED ORDER — CINACALCET HCL 30 MG PO TABS
30.0000 mg | ORAL_TABLET | Freq: Two times a day (BID) | ORAL | Status: DC
  Filled 2013-05-11 (×3): qty 1

## 2013-05-11 MED ORDER — SODIUM CHLORIDE 0.9 % IV SOLN
INTRAVENOUS | Status: DC
  Administered 2013-05-11: 22:00:00 via INTRAVENOUS
  Administered 2013-05-12: 50 mL/h via INTRAVENOUS

## 2013-05-11 MED ORDER — MIRTAZAPINE 7.5 MG PO TABS
7.5000 mg | ORAL_TABLET | Freq: Every day | ORAL | Status: DC
  Administered 2013-05-12 – 2013-05-15 (×4): 7.5 mg via ORAL
  Filled 2013-05-11 (×6): qty 1

## 2013-05-11 MED ORDER — SODIUM CHLORIDE 0.9 % IV SOLN
1000.0000 mL | Freq: Once | INTRAVENOUS | Status: AC
  Administered 2013-05-11: 1000 mL via INTRAVENOUS

## 2013-05-11 MED ORDER — ACETAMINOPHEN 325 MG PO TABS
650.0000 mg | ORAL_TABLET | Freq: Four times a day (QID) | ORAL | Status: DC | PRN
  Administered 2013-05-11: 650 mg via ORAL
  Filled 2013-05-11: qty 2

## 2013-05-11 MED ORDER — SACCHAROMYCES BOULARDII 250 MG PO CAPS
250.0000 mg | ORAL_CAPSULE | Freq: Two times a day (BID) | ORAL | Status: DC
  Administered 2013-05-12 – 2013-05-16 (×9): 250 mg via ORAL
  Filled 2013-05-11 (×11): qty 1

## 2013-05-11 MED ORDER — LANTHANUM CARBONATE 500 MG PO CHEW
500.0000 mg | CHEWABLE_TABLET | Freq: Three times a day (TID) | ORAL | Status: DC
  Administered 2013-05-13 – 2013-05-16 (×10): 500 mg via ORAL
  Filled 2013-05-11 (×18): qty 1

## 2013-05-11 MED ORDER — ACETAMINOPHEN 650 MG RE SUPP
650.0000 mg | Freq: Four times a day (QID) | RECTAL | Status: DC | PRN
  Administered 2013-05-11: 650 mg via RECTAL
  Filled 2013-05-11: qty 1

## 2013-05-11 MED ORDER — ONDANSETRON HCL 4 MG/2ML IJ SOLN: 4.0000 mg | Freq: Four times a day (QID) | INTRAMUSCULAR | Status: DC | PRN

## 2013-05-11 MED ORDER — SODIUM CHLORIDE 0.9 % IV BOLUS (SEPSIS)
500.0000 mL | Freq: Once | INTRAVENOUS | Status: AC
  Administered 2013-05-11: 500 mL via INTRAVENOUS

## 2013-05-11 MED ORDER — PIPERACILLIN-TAZOBACTAM IN DEX 2-0.25 GM/50ML IV SOLN
2.2500 g | Freq: Three times a day (TID) | INTRAVENOUS | Status: DC
  Administered 2013-05-11 – 2013-05-16 (×14): 2.25 g via INTRAVENOUS
  Filled 2013-05-11 (×20): qty 50

## 2013-05-11 MED ORDER — ONDANSETRON HCL 4 MG PO TABS: 4.0000 mg | ORAL_TABLET | Freq: Four times a day (QID) | ORAL | Status: DC | PRN

## 2013-05-11 MED ORDER — ALTEPLASE 2 MG IJ SOLR
2.0000 mg | Freq: Once | INTRAMUSCULAR | Status: DC | PRN
  Filled 2013-05-11: qty 2

## 2013-05-11 MED ORDER — LIDOCAINE HCL (PF) 1 % IJ SOLN: 5.0000 mL | INTRAMUSCULAR | Status: DC | PRN

## 2013-05-11 MED ORDER — GABAPENTIN 100 MG PO CAPS
100.0000 mg | ORAL_CAPSULE | Freq: Every day | ORAL | Status: DC
  Administered 2013-05-13 – 2013-05-16 (×4): 100 mg via ORAL
  Filled 2013-05-11 (×5): qty 1

## 2013-05-11 MED ORDER — PRO-STAT SUGAR FREE PO LIQD
30.0000 mL | Freq: Three times a day (TID) | ORAL | Status: DC
  Administered 2013-05-12 – 2013-05-16 (×11): 30 mL via ORAL
  Filled 2013-05-11 (×16): qty 30

## 2013-05-11 MED ORDER — DOCUSATE SODIUM 100 MG PO CAPS
100.0000 mg | ORAL_CAPSULE | Freq: Two times a day (BID) | ORAL | Status: DC
  Administered 2013-05-12 – 2013-05-16 (×9): 100 mg via ORAL
  Filled 2013-05-11 (×11): qty 1

## 2013-05-11 MED ORDER — DOXERCALCIFEROL 4 MCG/2ML IV SOLN
INTRAVENOUS | Status: AC
  Administered 2013-05-11: 1 ug via INTRAVENOUS
  Filled 2013-05-11: qty 2

## 2013-05-11 MED ORDER — PENTAFLUOROPROP-TETRAFLUOROETH EX AERO: 1.0000 "application " | INHALATION_SPRAY | CUTANEOUS | Status: DC | PRN

## 2013-05-11 MED ORDER — DOXERCALCIFEROL 4 MCG/2ML IV SOLN
1.0000 ug | INTRAVENOUS | Status: DC
  Administered 2013-05-11: 1 ug via INTRAVENOUS
  Filled 2013-05-11: qty 2

## 2013-05-11 MED ORDER — PIPERACILLIN-TAZOBACTAM 3.375 G IVPB 30 MIN: 3.3750 g | INTRAVENOUS | Status: AC

## 2013-05-11 MED ORDER — OXYCODONE HCL 5 MG PO TABS: 5.0000 mg | ORAL_TABLET | Freq: Four times a day (QID) | ORAL | Status: DC | PRN

## 2013-05-11 NOTE — H&P (Signed)
[facility] History and Physical  [name] RUE:[id number] DOB: [birth year] DOA: 05/11/2013  Referring physician: Dr Birdie Sons PCP: Patrick Givens, MD  Specialists: Renal.   Chief Complaint: Fever.  HPI: [name] is a 79 y.o. male with PMH significant for Decubitus Ulcer stage IV, ESRD om dialysis, MRSA Bacteremia who presents with fever, AMS. Patient has been having fevers since Monday, 3 days prior to admission. Tempeture at 102. He has been notice to be more lethargic, AMS. Patient completes IV antibiotics for MRSA bacteremia.  Patient was diagnose at SNF with UTI on 9-30 , he was getting Augmentin for this.  Patient denies abdominal pain, diarrhea. He does relates occassional cough.   Review of Systems: negative except as per HPI.   Past Medical History  Diagnosis Date  . Hypertension   . Diabetes mellitus     typeII / No meds  . Cataract 01/2002    Right  . Dialysis patient 10/13/2007  . Radiculopathy 05/14/2006    NCV study negative carpal tunnel, Left C5 radiculopathy  . Arthritis     Knee pain  . ESRD (end stage renal disease) on dialysis 2009    [facility] diaylsis Monday , Wed. and Friday per Dr. Kathrene Bongo  . Stroke   . Elevated PSA     h/o, pt had declined further eval.   . Syncope 11/02/2012  . Sepsis   . Dementia   . UTI (urinary tract infection)    Past Surgical History  Procedure Laterality Date  . Varicose vein surgery  1978  . External fixation wrist fracture  1990  . Thyroidectomy, partial  08/03/2003    Right thyroid lobectomy, subtotal parathyr. sec hyperparthyroidismadenosis  . Av fistula placement  07/2004    Left arm AV fistula placement [name] via [place])  . Eye surgery      Cataract  . Wound debridement N/A 02/08/2013    Procedure: DEBRIDEMENT WOUND;  Surgeon: Wilmon Arms. Corliss Skains, MD;  Location: [facility] OR;  Service: General;  Laterality: N/A;   Social History:  reports that he quit smoking about 11 years ago. His smoking  use included Cigarettes. He smoked 0.00 packs per day for 10 years. He has never used smokeless tobacco. He reports that he does not drink alcohol or use illicit drugs.   No Known Allergies  Family History  Problem Relation Age of Onset  . Cancer Father 50    Lung Cancer//? stomach cancer  . Cancer Sister 66    brain tumor    Prior to Admission medications   Medication Sig Start Date End Date Taking? Authorizing Provider  acetaminophen (TYLENOL) 650 MG CR tablet Take 1,300 mg by mouth every 4 (four) hours as needed for pain or fever (not to exceed 4000g daily).   Yes Historical Provider, MD  amoxicillin-clavulanate (AUGMENTIN) 875-125 MG per tablet Take 1 tablet by mouth 2 (two) times daily.   Yes Historical Provider, MD  cinacalcet (SENSIPAR) 30 MG tablet Take 30 mg by mouth 2 (two) times daily.    Yes Historical Provider, MD  dipyridamole-aspirin (AGGRENOX) 200-25 MG per 12 hr capsule Take 1 capsule by mouth 2 (two) times daily. 09/19/11  Yes Joaquim Nam, MD  feeding supplement (PRO-STAT SUGAR FREE 64) LIQD Take 30 mLs by mouth 3 (three) times daily with meals.   Yes Historical Provider, MD  gabapentin (NEURONTIN) 100 MG capsule Take 100 mg by mouth daily. 11/08/12  Yes Vassie Loll, MD  glucose blood test strip Use daily to  check sugar.  Dx: 250.40.  Glucocard Expression Strips 04/19/13  Yes Joaquim Nam, MD  lanthanum (FOSRENOL) 500 MG chewable tablet Chew 1 tablet (500 mg total) by mouth 3 (three) times daily with meals. 01/10/13  Yes Christiane Ha, MD  mirtazapine (REMERON) 7.5 MG tablet Take 7.5 mg by mouth at bedtime.   Yes Historical Provider, MD  multivitamin (RENA-VIT) TABS tablet Take 1 tablet by mouth daily.     Yes Historical Provider, MD  Nutritional Supplements (FEEDING SUPPLEMENT, NEPRO CARB STEADY,) LIQD Take 237 mLs by mouth 2 (two) times daily between meals. 11/08/12  Yes Vassie Loll, MD  Omega-3 Fatty Acids (FISH OIL) 1000 MG CAPS Take 1 capsule by mouth daily.      Yes Historical Provider, MD  oxyCODONE (OXY IR/ROXICODONE) 5 MG immediate release tablet Take 5 mg by mouth every 6 (six) hours as needed for pain.   Yes Historical Provider, MD  saccharomyces boulardii (FLORASTOR) 250 MG capsule Take 250 mg by mouth 2 (two) times daily.   Yes Historical Provider, MD   Physical Exam: Filed Vitals:   05/11/13 1234  BP: 121/65  Pulse: 125  Temp:   Resp: 26   General Appearance:    Alert, cooperative, no distress, appears stated age  Head:    Normocephalic, without obvious abnormality, atraumatic  Eyes:    PERRL, conjunctiva/corneas clear, EOM's intact,      Ears:    Normal TM's and external ear canals, both ears  Nose:   Nares normal, septum midline, mucosa normal, no drainage    or sinus tenderness  Throat:   Lips, mucosa, and tongue normal; teeth and gums normal  Neck:   Supple, symmetrical, trachea midline, no adenopathy;       thyroid:  No enlargement/tenderness/nodules; no carotid   bruit or JVD  Back:     Symmetric, no curvature, ROM normal, no CVA tenderness  Lungs:     Clear to auscultation bilaterally, respirations unlabored  Chest wall:    No tenderness or deformity  Heart:    Regular rate and rhythm, S1 and S2 normal, no murmur, rub   or gallop  Abdomen:     Soft, non-tender, bowel sounds active all four quadrants,    no masses, no organomegaly        Extremities:   Bilateral heel ulceration, necrotic  areas, bilateral calf with pressure ulcers, with necrotic area.   Pulses:   2+ and symmetric all extremities  Skin:   Skin color, texture, turgor normal, no rashes or lesions     Neurologic:   Limited by patient condition, answer some questions.     Labs on Admission:  Basic Metabolic Panel:  Recent Labs Lab 05/11/13 1150  NA 131*  K 5.5*  CL 89*  CO2 25  GLUCOSE 133*  BUN 69*  CREATININE 5.54*  CALCIUM 10.0   Liver Function Tests:  Recent Labs Lab 05/11/13 1150  AST 30  ALT 19  ALKPHOS 83  BILITOT 0.4  PROT 9.6*   ALBUMIN 2.5*   No results found for this basename: LIPASE, AMYLASE,  in the last 168 hours No results found for this basename: AMMONIA,  in the last 168 hours CBC:  Recent Labs Lab 05/11/13 1150  WBC 16.4*  NEUTROABS 14.9*  HGB 11.9*  HCT 36.6*  MCV 76.9*  PLT 485*   Cardiac Enzymes: No results found for this basename: CKTOTAL, CKMB, CKMBINDEX, TROPONINI,  in the last 168 hours  BNP (last 3  results) No results found for this basename: PROBNP,  in the last 8760 hours CBG: No results found for this basename: GLUCAP,  in the last 168 hours  Radiological Exams on Admission: Dg Chest Port 1 View  05/11/2013   CLINICAL DATA:  Generalized body aches, hypertension, diabetes, end-stage renal disease on dialysis, initial encounter  EXAM: PORTABLE CHEST - 1 VIEW  COMPARISON:  Portable exam 1118 hr compared to 01/29/2013  FINDINGS: Slightly rotated to the left.  Enlargement of cardiac silhouette.  Atherosclerotic calcification aorta.  Mediastinal contours and pulmonary vascularity normal.  Mild right basilar atelectasis.  Lungs otherwise clear.  No pleural effusion or pneumothorax.  Scattered endplate spur formation thoracic spine.  Chronic right rotator cuff tear.  IMPRESSION: Minimal enlargement of cardiac silhouette.  Mild right basilar atelectasis.   Electronically Signed   By: Ulyses Southward M.D.   On: 05/11/2013 12:13      Assessment/Plan   Sepsis   Diabetes mellitus with end stage renal disease   End stage renal disease   Decubitus ulcer of sacral region, stage 4   Protein-calorie malnutrition, severe  1-Sepsis: Patient presents with fever, leukocytosis, hypotension, Lactic acid 3.1. Admit to step Down unit. IV antibiotics. Blood culture ordered. Chest x ray show right mild basilar atelectasis. Urine culture. Source could be decubitus ulcer. Vancomycin and Zosyn per pharmacy to dose.   2-Decubitus Ulcer// Bilateral LE pressure ulcers, Calf and heel: necrotic area. Wound care  consulted.  3-ESRD: renal consulted. El;ectrolytes correction with dialysis. Patient has foley catheter in place. Might need to DC foley catheter.  4-Protein Caloric Malnutrition: Continue with ensure.    Code Status: DNR Family Communication: Care discussed with wife and daughter, who were at bedside.  Disposition Plan: expect 4 to 5 days.   Time spent: 65 minutes.   REGALADO,BELKYS [facility] Pager [phone]  If 7PM-7AM, please contact night-coverage [email or web address] Password [email or web address] 05/11/2013, 2:26 PM

## 2013-05-11 NOTE — Consult Note (Signed)
I have seen and examined this patient and agree with plan as outlined by Gerome Apley, PA-C.  Pt with rigors and bacteremia with high likelihood related to sacral decub or possibly right heel decubitus.  Agree with cultures and resumption of vanco given his h/o MRSA.  Pt has had FTT and worsening dementia and may possibly need BKA.  Would consider it appropriate to initiate palliative care consult to help set goals/limits of care.  Will cont with HD qMWF for now as is his outpt schedule. Ayari Liwanag A,MD 05/11/2013 3:38 PM

## 2013-05-11 NOTE — ED Notes (Signed)
Results of lactic acid called to primary nurse 

## 2013-05-11 NOTE — ED Notes (Signed)
Lactic acid 3.19 per Horner in mini lab.   Will relay information to Dr. Denton Lank.

## 2013-05-11 NOTE — ED Notes (Signed)
IV team was unable to get an additional line in patient.

## 2013-05-11 NOTE — ED Notes (Signed)
Patient came in with a foley.  No urine in the foley since patient arrived.   Will monitor.

## 2013-05-11 NOTE — Consult Note (Addendum)
WOC consult Note Reason for Consult: Consult requested for multiple wounds.  Pt on ER stretcher and difficult to turn.  Awaiting room on upstairs unit upon admission. Family at bedside to answer questions.  Pt had surgical debridement in June from [facility] team and wound has continued to improve, according to family members at bedside.  "He has PVD and his wounds to his feet won't heal, " pt's daughter states. Wound type: Sacrum chronic stage 4 wound; 10X6X1cm with undermining to wound edges approx 1 cm.  Inner wound beefy red with bone palpable, no odor, small amt pink drainage.  Does not appear to be infected. If urine is not considered to be primary source of infection, then consider X-ray to R/O osteomyelitis. Pulses palpable to BLE. Right hip 1X2X11cm stage 2 wound, pink and dry; no odor or drainage. Right outer calf unstageable; 6X3cm 100% dry eschar, no odor or drainage. Right heel 5X8cm unstageable; 100% dry eschar, no odor or drainage. Right anterior plantar foot 2X1.5X.1cm full thickness, moist and red, no odor, small amt red drainage. Right anterior plantar foot full thickness 1X1cm 100% dry eschar, no odor or drainage. Right great toe full thickness1X1cm 100% dry eschar, no odor or drainage. Left heel 6X6cm unstageable; 100% dry eschar, no odor or drainage. Pressure Ulcer POA: Yes Dressing procedure/placement/frequency: Float heels to reduce pressure.  It is best practice to leave dry stable eschar intact.  Air mattress to reduce pressure to sacrum.  Continue current plan of care with moist gauze packing.  Discussed pressure ulcer etiology, topical treatment, and preventive measures with patient and family members. Family appears to be well-informed regarding topical treatment. Please re-consult if further assistance is needed.  Thank-you,  Cammie Mcgee MSN, RN, CWOCN, [place], CNS [phone]

## 2013-05-11 NOTE — ED Notes (Signed)
Dialysis called and advised okay to bring patient up now.   Patient report given to dialysis RN also.   Called [phone] and advised that patient going to dialysis first.   Consent for dialysis obtained by wife and paperwork sent with patient to dialysis.  Patient taken by Eileen Stanford, RN.

## 2013-05-11 NOTE — ED Notes (Signed)
Phlebotomy got both sets of blood cultures prior to the start of the vancomycin.

## 2013-05-11 NOTE — ED Notes (Signed)
MAP 85 and Lactic Acid <4 - do no need to call Level 1 sepsis.

## 2013-05-11 NOTE — ED Notes (Signed)
Unable to get blood cultures on patient.    RN's x 2 have tried to collect.  Labs sent.   Need additional blood for cultures.   IV team called to come get a second line in patient.

## 2013-05-11 NOTE — ED Notes (Signed)
Tried to call report to floor.   RN was "in a contact room and will have to call back".  I advised okay, left number for them to callback.

## 2013-05-11 NOTE — Progress Notes (Signed)
On hemodialysis Tx. Pt is tachycardic with HR in the 130's. Pt began clotting lines and was rinsed back 45 min early. Dr. Lowell Guitar called and informed of tachycardia, temp of 101.8, and of pt clotting lines. States it is OK to not start Tx back. Pt completed 3hr of hemodialysis Tx.

## 2013-05-11 NOTE — Consult Note (Addendum)
[facility] Renal Consultation Note  Indication for Consultation:  Management of ESRD/hemodialysis; anemia, hypertension/volume and secondary hyperparathyroidism  HPI: [name] is a 79 y.o. male resident of [facility] skilled nursing facility with ESRD on dialysis on MWF at the [facility], dementia, and Stage 4 decubitus ulcer who presented to the ED this morning by EMS with fever, rigors, chills, and generalized body aches.  He is unable to provide any history, but his daughter reports that he had low-grade fever on Friday night 9/26 and was noticeably quiet.  At dialysis on Monday the nurse reported that the patient was very weak, nonverbal, and with chills, but no fever.  Yesterday he was diagnosed with a urinary tract infection at his nursing home and started PO Augmentin. He was hospitalized 6/21-7/8 for MRSA sepsis believed to be related to his Stage 4 sacral ulcer with osteomyelitis, for which he received six weeks of IV Ancef and Vancomycin.  Fever today was as high as 102.9, and WBCs are up to 16,400.  Blood cultures are pending, and IV Vancomycin and Zosyn were started.  Dialysis Orders:  MWF @ [place] 3:45    78 kg    2K/2Ca    500/A1.5    Heparin 5000 U     AVF @ LFA Hectorol 1 mcg     Epogen 13,000 U     Venofer 0   Past Medical History  Diagnosis Date  . Hypertension   . Diabetes mellitus     typeII / No meds  . Cataract 01/2002    Right  . Dialysis patient 10/13/2007  . Radiculopathy 05/14/2006    NCV study negative carpal tunnel, Left C5 radiculopathy  . Arthritis     Knee pain  . ESRD (end stage renal disease) on dialysis 2009    [facility] diaylsis Monday , Wed. and Friday per Dr. Kathrene Bongo  . Stroke   . Elevated PSA     h/o, pt had declined further eval.   . Syncope 11/02/2012  . Sepsis   . Dementia   . UTI (urinary tract infection)    Past Surgical History  Procedure Laterality Date  . Varicose vein surgery   1978  . External fixation wrist fracture  1990  . Thyroidectomy, partial  08/03/2003    Right thyroid lobectomy, subtotal parathyr. sec hyperparthyroidismadenosis  . Av fistula placement  07/2004    Left arm AV fistula placement [name] via [place])  . Eye surgery      Cataract  . Wound debridement N/A 02/08/2013    Procedure: DEBRIDEMENT WOUND;  Surgeon: Wilmon Arms. Corliss Skains, MD;  Location: [facility] OR;  Service: General;  Laterality: N/A;   Family History  Problem Relation Age of Onset  . Cancer Father 23    Lung Cancer//? stomach cancer  . Cancer Sister 37    brain tumor   Social History  He quit smoking cigarettes about 11 years ago and denies any history of alcohol or illicit drugs.  No Known Allergies Prior to Admission medications   Medication Sig Start Date End Date Taking? Authorizing Provider  acetaminophen (TYLENOL) 650 MG CR tablet Take 1,300 mg by mouth every 4 (four) hours as needed for pain or fever (not to exceed 4000g daily).   Yes Historical Provider, MD  amoxicillin-clavulanate (AUGMENTIN) 875-125 MG per tablet Take 1 tablet by mouth 2 (two) times daily.   Yes Historical Provider, MD  cinacalcet (SENSIPAR) 30 MG tablet Take 30 mg by mouth  2 (two) times daily.    Yes Historical Provider, MD  dipyridamole-aspirin (AGGRENOX) 200-25 MG per 12 hr capsule Take 1 capsule by mouth 2 (two) times daily. 09/19/11  Yes Joaquim Nam, MD  feeding supplement (PRO-STAT SUGAR FREE 64) LIQD Take 30 mLs by mouth 3 (three) times daily with meals.   Yes Historical Provider, MD  gabapentin (NEURONTIN) 100 MG capsule Take 100 mg by mouth daily. 11/08/12  Yes Vassie Loll, MD  glucose blood test strip Use daily to check sugar.  Dx: 250.40.  Glucocard Expression Strips 04/19/13  Yes Joaquim Nam, MD  lanthanum (FOSRENOL) 500 MG chewable tablet Chew 1 tablet (500 mg total) by mouth 3 (three) times daily with meals. 01/10/13  Yes Christiane Ha, MD  mirtazapine (REMERON) 7.5 MG tablet Take 7.5 mg by  mouth at bedtime.   Yes Historical Provider, MD  multivitamin (RENA-VIT) TABS tablet Take 1 tablet by mouth daily.     Yes Historical Provider, MD  Nutritional Supplements (FEEDING SUPPLEMENT, NEPRO CARB STEADY,) LIQD Take 237 mLs by mouth 2 (two) times daily between meals. 11/08/12  Yes Vassie Loll, MD  Omega-3 Fatty Acids (FISH OIL) 1000 MG CAPS Take 1 capsule by mouth daily.     Yes Historical Provider, MD  oxyCODONE (OXY IR/ROXICODONE) 5 MG immediate release tablet Take 5 mg by mouth every 6 (six) hours as needed for pain.   Yes Historical Provider, MD  saccharomyces boulardii (FLORASTOR) 250 MG capsule Take 250 mg by mouth 2 (two) times daily.   Yes Historical Provider, MD   Labs:  Results for orders placed during the hospital encounter of 05/11/13 (from the past 48 hour(s))  CBC WITH DIFFERENTIAL     Status: Abnormal   Collection Time    05/11/13 11:50 AM      Result Value Range   WBC 16.4 (*) 4.0 - 10.5 K/uL   RBC 4.76  4.22 - 5.81 MIL/uL   Hemoglobin 11.9 (*) 13.0 - 17.0 g/dL   HCT 16.1 (*) 09.6 - 04.5 %   MCV 76.9 (*) 78.0 - 100.0 fL   MCH 25.0 (*) 26.0 - 34.0 pg   MCHC 32.5  30.0 - 36.0 g/dL   RDW 40.9 (*) 81.1 - 91.4 %   Platelets 485 (*) 150 - 400 K/uL   Neutrophils Relative % 90 (*) 43 - 77 %   Neutro Abs 14.9 (*) 1.7 - 7.7 K/uL   Lymphocytes Relative 3 (*) 12 - 46 %   Lymphs Abs 0.5 (*) 0.7 - 4.0 K/uL   Monocytes Relative 6  3 - 12 %   Monocytes Absolute 1.0  0.1 - 1.0 K/uL   Eosinophils Relative 0  0 - 5 %   Eosinophils Absolute 0.0  0.0 - 0.7 K/uL   Basophils Relative 1  0 - 1 %   Basophils Absolute 0.1  0.0 - 0.1 K/uL  COMPREHENSIVE METABOLIC PANEL     Status: Abnormal   Collection Time    05/11/13 11:50 AM      Result Value Range   Sodium 131 (*) 135 - 145 mEq/L   Potassium 5.5 (*) 3.5 - 5.1 mEq/L   Chloride 89 (*) 96 - 112 mEq/L   CO2 25  19 - 32 mEq/L   Glucose, Bld 133 (*) 70 - 99 mg/dL   BUN 69 (*) 6 - 23 mg/dL   Creatinine, Ser 7.82 (*) 0.50 - 1.35  mg/dL   Calcium 95.6  8.4 - 21.3  mg/dL   Total Protein 9.6 (*) 6.0 - 8.3 g/dL   Albumin 2.5 (*) 3.5 - 5.2 g/dL   AST 30  0 - 37 U/L   ALT 19  0 - 53 U/L   Alkaline Phosphatase 83  39 - 117 U/L   Total Bilirubin 0.4  0.3 - 1.2 mg/dL   GFR calc non Af Amer 9 (*) >90 mL/min   GFR calc Af Amer 10 (*) >90 mL/min   Comment: (NOTE)     The eGFR has been calculated using the CKD EPI equation.     This calculation has not been validated in all clinical situations.     eGFR's persistently <90 mL/min signify possible Chronic Kidney     Disease.  CG4 I-STAT (LACTIC ACID)     Status: Abnormal   Collection Time    05/11/13 12:02 PM      Result Value Range   Lactic Acid, Venous 3.19 (*) 0.5 - 2.2 mmol/L   Constitutional: positive for chills, fatigue and fevers, negative for sweats Ears, nose, mouth, throat, and face: negative for earaches, hoarseness, nasal congestion and sore throat Respiratory: negative for cough, dyspnea on exertion, hemoptysis and sputum Cardiovascular: negative for chest pain, dyspnea, fatigue, orthopnea and palpitations Gastrointestinal: negative for abdominal pain, change in bowel habits, nausea and vomiting Genitourinary:negative, oliguric Musculoskeletal:positive for muscle weakness and myalgias, negative for back pain and neck pain Neurological: positive for weakness, negative for headaches  Physical Exam: Filed Vitals:   05/11/13 1234  BP: 121/65  Pulse: 125  Temp:   Resp: 26     General appearance: alert, cachectic and moderate distress Head: Normocephalic, without obvious abnormality, atraumatic Neck: no adenopathy, no carotid bruit, no JVD and supple, symmetrical, trachea midline Resp: clear to auscultation bilaterally Cardio: Tachycardic, no murmur or rub GI: soft, non-tender; bowel sounds normal; no masses,  no organomegaly Back: Stage 4 sacral decubitus ulcer with pink tissue, no drainage Extremities: No edema, necrotic malodorous areas at posterior  lower legs and both heels Neurologic: Grossly normal Dialysis Access: AVF @ LFA with + bruit   Assessment/Plan: 1. Fever/leukocytosis - likely sepsis, T 102.9, WBCs 16.4; multiple pressure ulcers, including sacral decubitus as possible source; also with Foley catheter, started Augmentin yesterday for UTI per SNF; BCs pending. 2. Stage 4 sacral decubitus/bilateral LE pressure ulcers - necrotic wounds on lower extremities, wound care consulted. 3. ESRD - HD on MWF @ [place]; K 5.5.  HD pending. 4. Hypertension/volume - BP 102/65 most recently, no meds; chest x-ray with mild right basilar atelectasis, otherwise, clear; wt 82.1 kg with EDW 78. 5. Anemia - Hgb 11.9 on outpatient Epogen 13,000 U; last T-sat 12% with ferritin 1578. 6. Metabolic bone disease - Ca 10 (11.2 corrected), last P 5.1, iPTH 94; Hectorol 1 mcg, Sensipar 30 mcg qd, Fosrenol 500 mgwith meals. 7. Nutrition - Alb 2.5, renal diet, multivitamin.  [name],CHARLES 05/11/2013, 2:31 PM   Attending Nephrologist: Terrial Rhodes, MD

## 2013-05-11 NOTE — ED Notes (Addendum)
Pt from Nursing home, c/o generlized bone pain and shaking. Per EMS and Nursing home, pt being treated for UTI, started antibiotics yesterday. Pt is due for dialysis today

## 2013-05-11 NOTE — Progress Notes (Signed)
ANTIBIOTIC CONSULT NOTE - INITIAL  Pharmacy Consult:  Vancomycin / Zosyn Indication:  Sepsis  No Known Allergies  Patient Measurements: Height: 6' 5.95" (198 cm) Weight: 181 lb 7 oz (82.3 kg) IBW/kg (Calculated) : 91.29  Vital Signs: Temp: 102.3 F (39.1 C) (10/01 1048) Temp src: Rectal (10/01 1048) BP: 134/79 mmHg (10/01 1035) Pulse Rate: 129 (10/01 1040)  Labs: No results found for this basename: WBC, HGB, PLT, LABCREA, CREATININE,  in the last 72 hours Estimated Creatinine Clearance: 13.6 ml/min (by C-G formula based on Cr of 5.03). No results found for this basename: VANCOTROUGH, VANCOPEAK, VANCORANDOM, GENTTROUGH, GENTPEAK, GENTRANDOM, TOBRATROUGH, TOBRAPEAK, TOBRARND, AMIKACINPEAK, AMIKACINTROU, AMIKACIN,  in the last 72 hours   Microbiology: No results found for this or any previous visit (from the past 720 hour(s)).  Medical History: Past Medical History  Diagnosis Date  . Hypertension   . Diabetes mellitus     typeII / No meds  . Cataract 01/2002    Right  . Dialysis patient 10/13/2007  . Radiculopathy 05/14/2006    NCV study negative carpal tunnel, Left C5 radiculopathy  . Arthritis     Knee pain  . ESRD (end stage renal disease) on dialysis 2009    [facility] diaylsis Monday , Wed. and Friday per Dr. Kathrene Bongo  . Stroke   . Elevated PSA     h/o, pt had declined further eval.   . Syncope 11/02/2012  . Sepsis   . Dementia   . UTI (urinary tract infection)        Assessment: 82 YOM admitted from nursing home with generalized body aches.  Pharmacy consulted to manage vancomycin and Zosyn for sepsis.  Of note, he started treatment for UTI yesterday.  Patient also has ESRD on HD MWF; due for HD today per documentation.  Baseline labs pending collection.  Vanc 10/1 >> Zosyn 10/1 >> Augmentin 9/30 >> 10/1   Goal of Therapy:  Vanc pre-HD level:  15-25 mcg/mL   Plan:  - Vanc 1750mg  IV stat, then 1gm IV q-HD on MWF - Zosyn 3.375gm IV to  be infused over 30 min x 1 stat, then 2.25gm IV Q8H - F/U labs, micro data, clinical course, vanc pre-HD as indicated - F/U updated height and weight - F/U Renal plans    [name], PharmD, BCPS Pager:  [phone] 05/11/2013, 11:18 AM

## 2013-05-11 NOTE — ED Notes (Signed)
Phlebotomy at bedside to get blood cultures.  IV team advised on their way.

## 2013-05-11 NOTE — ED Provider Notes (Signed)
CSN: [id number]     Arrival date & time 05/11/13  1025 History   First MD Initiated Contact with Patient 05/11/13 1033     Chief Complaint  Patient presents with  . Generalized Body Aches   (Consider location/radiation/quality/duration/timing/severity/associated sxs/prior Treatment) HPI   Patient is an 79 yo male with a history of HTN, DM, ESRD on dialysis at [facility] with Dr. Kathrene Bongo, dementia, and multiple skin ulcerations who presented via EMS from his SNF. The sign out from EMS was that the patient developed generalized bone pain and shaking. He was diagnosed with a UTI yesterday and started on augmentin at that time.  He is unable to give much of a history and intermittently states he is in pain and then will say he is not in pain. Patients daughter is present and states he started with a fever on Friday night and has been less verbal since that time. Had dialysis on Monday and per daughter appeared "down" following dialysis. Has been making urine as long as he drinks enough per family members up until today. He has not made any at this time.  Notably he was admitted in June for sepsis believed to be related to his sacral ulceration and was sent home on 6 weeks of IV vanc and ancef for osteomyelitis.  Past Medical History  Diagnosis Date  . Hypertension   . Diabetes mellitus     typeII / No meds  . Cataract 01/2002    Right  . Dialysis patient 10/13/2007  . Radiculopathy 05/14/2006    NCV study negative carpal tunnel, Left C5 radiculopathy  . Arthritis     Knee pain  . ESRD (end stage renal disease) on dialysis 2009    [facility] diaylsis Monday , Wed. and Friday per Dr. Kathrene Bongo  . Stroke   . Elevated PSA     h/o, pt had declined further eval.   . Syncope 11/02/2012  . Sepsis   . Dementia   . UTI (urinary tract infection)    Past Surgical History  Procedure Laterality Date  . Varicose vein surgery  1978  . External fixation wrist fracture   1990  . Thyroidectomy, partial  08/03/2003    Right thyroid lobectomy, subtotal parathyr. sec hyperparthyroidismadenosis  . Av fistula placement  07/2004    Left arm AV fistula placement [name] via [place])  . Eye surgery      Cataract  . Wound debridement N/A 02/08/2013    Procedure: DEBRIDEMENT WOUND;  Surgeon: Wilmon Arms. Corliss Skains, MD;  Location: [facility] OR;  Service: General;  Laterality: N/A;   Family History  Problem Relation Age of Onset  . Cancer Father 34    Lung Cancer//? stomach cancer  . Cancer Sister 56    brain tumor   History  Substance Use Topics  . Smoking status: Former Smoker -- 10 years    Types: Cigarettes    Quit date: 04/01/2002  . Smokeless tobacco: Never Used     Comment: quit ove 20 years  . Alcohol Use: No    Review of Systems unable to get ROS due to decreased verbal ability.  Allergies  Review of patient's allergies indicates no known allergies.  Home Medications   Current Outpatient Rx  Name  Route  Sig  Dispense  Refill  . acetaminophen (TYLENOL) 650 MG CR tablet   Oral   Take 1,300 mg by mouth every 4 (four) hours as needed for pain or fever (not to exceed 4000g  daily).         [name] Kitchen amoxicillin-clavulanate (AUGMENTIN) 875-125 MG per tablet   Oral   Take 1 tablet by mouth 2 (two) times daily.         . cinacalcet (SENSIPAR) 30 MG tablet   Oral   Take 30 mg by mouth 2 (two) times daily.          [name] Kitchen dipyridamole-aspirin (AGGRENOX) 200-25 MG per 12 hr capsule   Oral   Take 1 capsule by mouth 2 (two) times daily.         . feeding supplement (PRO-STAT SUGAR FREE 64) LIQD   Oral   Take 30 mLs by mouth 3 (three) times daily with meals.         . gabapentin (NEURONTIN) 100 MG capsule   Oral   Take 100 mg by mouth daily.         [name] Kitchen glucose blood test strip      Use daily to check sugar.  Dx: 250.40.  Glucocard Expression Strips   100 each   3   . lanthanum (FOSRENOL) 500 MG chewable tablet   Oral   Chew 1 tablet (500 mg total) by  mouth 3 (three) times daily with meals.         . mirtazapine (REMERON) 7.5 MG tablet   Oral   Take 7.5 mg by mouth at bedtime.         . multivitamin (RENA-VIT) TABS tablet   Oral   Take 1 tablet by mouth daily.           . Nutritional Supplements (FEEDING SUPPLEMENT, NEPRO CARB STEADY,) LIQD   Oral   Take 237 mLs by mouth 2 (two) times daily between meals.         . Omega-3 Fatty Acids (FISH OIL) 1000 MG CAPS   Oral   Take 1 capsule by mouth daily.           [name] Kitchen oxyCODONE (OXY IR/ROXICODONE) 5 MG immediate release tablet   Oral   Take 5 mg by mouth every 6 (six) hours as needed for pain.         [name] Kitchen saccharomyces boulardii (FLORASTOR) 250 MG capsule   Oral   Take 250 mg by mouth 2 (two) times daily.          BP 134/79  Pulse 129  Temp(Src) 102.3 F (39.1 C) (Rectal)  Resp 16  SpO2 99% Physical Exam  Constitutional: He appears well-developed.  Malnourished appearing  HENT:  Head: Normocephalic and atraumatic.  Mouth/Throat: Oropharynx is clear and moist.  Eyes: Conjunctivae are normal. Pupils are equal, round, and reactive to light.  Neck: Neck supple.  Cardiovascular: Normal heart sounds.   tachycardic  Pulmonary/Chest: Effort normal.  Diminished breath sounds, no crackles or wheezes heard  Abdominal: Soft. Bowel sounds are normal. He exhibits no distension. There is no tenderness.  Musculoskeletal: He exhibits no edema.  Skin:  Sacral ulcer stage 3 with pink tissue, no drainage Ulcerations on the back of bilateral LE with black eschar in place, no apparent drainage Ulceration on right big toe no drainage apparent.    ED Course  Procedures (including critical care time) Labs Review Labs Reviewed  CBC WITH DIFFERENTIAL - Abnormal; Notable for the following:    WBC 16.4 (*)    Hemoglobin 11.9 (*)    HCT 36.6 (*)    MCV 76.9 (*)    MCH 25.0 (*)    RDW 17.8 (*)  Platelets 485 (*)    Neutrophils Relative % 90 (*)    Neutro Abs 14.9 (*)     Lymphocytes Relative 3 (*)    Lymphs Abs 0.5 (*)    All other components within normal limits  COMPREHENSIVE METABOLIC PANEL - Abnormal; Notable for the following:    Sodium 131 (*)    Potassium 5.5 (*)    Chloride 89 (*)    Glucose, Bld 133 (*)    BUN 69 (*)    Creatinine, Ser 5.54 (*)    Total Protein 9.6 (*)    Albumin 2.5 (*)    GFR calc non Af Amer 9 (*)    GFR calc Af Amer 10 (*)    All other components within normal limits  CG4 I-STAT (LACTIC ACID) - Abnormal; Notable for the following:    Lactic Acid, Venous 3.19 (*)    All other components within normal limits  CULTURE, BLOOD (ROUTINE X 2)  CULTURE, BLOOD (ROUTINE X 2)  URINE CULTURE  URINALYSIS, ROUTINE W REFLEX MICROSCOPIC   Imaging Review No results found.  MDM  No diagnosis found. 11:00 am: patient seen and examined. Patient recently diagnosed with UTI at nursing home. Additionally with pressure ulcers on sacrum and bilateral lower extremities. Presented from nursing home with report from EMS that he was having generalized bone pain and shaking. Temp here 102.3 rectally and patient is tachycardic with patient meeting sepsis criteria. Will order CBC with diff, CMET, Blood cultures, UA, UCx, lactate, and vanc/zosyn per pharmacy.  11:35 am: nursing obtaining labs.   12:08 pm: lactic acid 3.19. CBC reveals elevated WBC to 16.4 with 90% neutrophils. Will await CMET to return and then call [facility] for admit.  1:02 pm: CMET returned with Cr 5.54 and potasium of 5.5, Na of 131. Paged hospitalists for admission.  1:22 pm: spoke with hospitalist, Dr Sunnie Nielsen and will admit the patient. Additionally spoke with Dr Eulogio Ditch regarding dialysis.   Glori Luis, MD 05/11/13 1429  Glori Luis, MD 05/11/13 1447

## 2013-05-11 NOTE — ED Notes (Signed)
Dressings changed by wound care RN.

## 2013-05-12 DIAGNOSIS — N390 Urinary tract infection, site not specified: Secondary | ICD-10-CM

## 2013-05-12 DIAGNOSIS — E860 Dehydration: Secondary | ICD-10-CM | POA: Diagnosis present

## 2013-05-12 DIAGNOSIS — R8290 Unspecified abnormal findings in urine: Secondary | ICD-10-CM | POA: Diagnosis present

## 2013-05-12 DIAGNOSIS — R509 Fever, unspecified: Secondary | ICD-10-CM | POA: Diagnosis present

## 2013-05-12 DIAGNOSIS — J9601 Acute respiratory failure with hypoxia: Secondary | ICD-10-CM | POA: Diagnosis present

## 2013-05-12 LAB — URINE MICROSCOPIC-ADD ON

## 2013-05-12 LAB — GLUCOSE, CAPILLARY: Glucose-Capillary: 105 mg/dL — ABNORMAL HIGH (ref 70–99)

## 2013-05-12 LAB — CREATININE, SERUM
Creatinine, Ser: 3.11 mg/dL — ABNORMAL HIGH (ref 0.50–1.35)
GFR calc Af Amer: 20 mL/min — ABNORMAL LOW (ref >90)
GFR calc non Af Amer: 17 mL/min — ABNORMAL LOW (ref >90)

## 2013-05-12 LAB — URINALYSIS, ROUTINE W REFLEX MICROSCOPIC
Glucose, UA: 100 mg/dL — AB
Ketones, ur: NEGATIVE mg/dL
Protein, ur: >300 mg/dL — AB

## 2013-05-12 LAB — CBC
HCT: 33.3 % — ABNORMAL LOW (ref 39.0–52.0)
Hemoglobin: 11 g/dL — ABNORMAL LOW (ref 13.0–17.0)
MCH: 25.5 pg — ABNORMAL LOW (ref 26.0–34.0)
MCHC: 33 g/dL (ref 30.0–36.0)
Platelets: 442 10*3/uL — ABNORMAL HIGH (ref 150–400)
Platelets: 377 10*3/uL (ref 150–400)
RBC: 4.32 MIL/uL (ref 4.22–5.81)
RDW: 17.6 % — ABNORMAL HIGH (ref 11.5–15.5)
WBC: 13.3 10*3/uL — ABNORMAL HIGH (ref 4.0–10.5)
WBC: 12.5 10*3/uL — ABNORMAL HIGH (ref 4.0–10.5)

## 2013-05-12 LAB — BASIC METABOLIC PANEL
BUN: 25 mg/dL — ABNORMAL HIGH (ref 6–23)
Chloride: 108 mEq/L (ref 96–112)
GFR calc Af Amer: 25 mL/min — ABNORMAL LOW (ref >90)
Glucose, Bld: 75 mg/dL (ref 70–99)
Potassium: 3.7 mEq/L (ref 3.5–5.1)

## 2013-05-12 LAB — MRSA PCR SCREENING: MRSA by PCR: NEGATIVE

## 2013-05-12 LAB — LACTIC ACID, PLASMA: Lactic Acid, Venous: 1.4 mmol/L (ref 0.5–2.2)

## 2013-05-12 MED ORDER — SODIUM CHLORIDE 0.9 % IV BOLUS (SEPSIS)
500.0000 mL | Freq: Once | INTRAVENOUS | Status: AC
  Administered 2013-05-12: 500 mL via INTRAVENOUS

## 2013-05-12 NOTE — Progress Notes (Deleted)
I have seen and examined this patient and agree with plan as outlined by Gerome Apley, PA-C.  Pt unfortunately did not receive HD yesterday and is due for EGD today.  Plan for HD after EGD and will likely need daily HD d/t volume overload and hypotension.  Plan for HD again tomorrow and either Friday or Saturday. Kurtis Anastasia A,MD 05/12/2013 9:07 AM

## 2013-05-12 NOTE — Progress Notes (Signed)
[facility] Progress Note [place] TEAM 1 - Stepdown ICU Team   Khanh Tanori ZOX:[id number] DOB: Mar 20, 1931 DOA: 05/11/2013 PCP: Crawford Givens, MD  Brief narrative: 79 year old male patient with known history of chronic kidney disease on dialysis and a stage IV decubitus ulcer, bed bound. Also history of C. difficile in the past 12 months. He presented with fever altered mental status. Apparently the fevers have been ongoing for at least 48 hours. His temperature was 102. This patient does have a prior history of MRSA bacteremia had recently completed antibiotics for this and presumed osteomyelitis. Had been evaluated by the infectious disease service at last admission. At the skilled nursing facility he was apparently diagnosed with urinary tract infection on 9/30 and had been started on Augmentin.  Assessment/Plan: Active Problems:   Sepsis- likely from UTI -presumed urinary source but also looks very dry so can't completely exclude PNA since is requiring oxygen and after hydration CXR may demonstrate PNA - also, must keep in mind that he previously had infected decubitus ulcer  -follow up on all cx's and cont empiric tx  UTIs/Fever -as above    Dehydration -continue gentle IVF -underwent HD overnight with ~600cc UF-later pt SBP decreased to 84 and responded to fluid challenges    Acute respiratory failure with hypoxia -? due to hypoventilation from AMS/sepsis -repeat CXR in am -wean O2 as tolerated    Diabetes mellitus with end stage renal disease -well controlled -was not on meds pre admit    HYPERTENSION -BP soft at this time -was not on meds pre admit    CKD (chronic kidney disease) stage V requiring chronic dialysis -HD per nephrology    Anemia in chronic kidney disease -hgb stable    History of CVA (cerebrovascular accident) -RN notes pt pocketing meds so will ask SLP to evaluate    Decubitus ulcer of sacral region, stage 4/Decubitus ulcer of right heel,  unstageable  -chronic and required surgical debridement last admission (June 2014) -suspected had underlying osteomyelitis so given prolonged anbx's IV as recommended by ID  -WOC RN eval    Protein-calorie malnutrition, severe -as above re ?? Dysphagia -agree with nephro has poor prognosis and may benefit from Palliative eval this admit   DVT prophylaxis: Subcutaneous heparin Code Status: DO NOT RESUSCITATE Family Communication: No family at bedside Disposition Plan/Expected LOS: Transfer to floor Isolation: History of MRSA so contact isolation initiated although MRSA PCR swab was negative Nutritional Status: Acute and chronic protein calorie malnutrition as evidenced by suspected weight loss. Visual inspection of patient she was a patient at clearly appears to be at a lower weight than the picture that is listed on his intake information from the nursing facility  Consultants: Nephrology  Procedures: None  Antibiotics: Zosyn 10/1 >>> Vancomycin 10/1 >>>  HPI/Subjective: Patient awake but clearly confused. Able to follow commands. No apparent complaints verbalized by the patient.  Objective: Blood pressure 106/60, pulse 103, temperature 98.5 F (36.9 C), temperature source Oral, resp. rate 29, height 6\' 4"  (1.93 m), weight 74.3 kg (163 lb 12.8 oz), SpO2 100.00%.  Intake/Output Summary (Last 24 hours) at 05/12/13 1255 Last data filed at 05/12/13 1100  Gross per 24 hour  Intake   1200 ml  Output    678 ml  Net    522 ml     Exam: General: No acute respiratory distress-oral mucous membranes are dry Lungs: Clear to auscultation bilaterally without wheezes or crackles, diminished through bases, 3L Cardiovascular: Regular rate and her  sugar he AV block without murmur gallop or rub normal S1 and S2, no peripheral edema or JVD Abdomen: Nontender, nondistended, soft, bowel sounds positive, no rebound, no ascites, no appreciable mass Musculoskeletal: No significant cyanosis,  clubbing of bilateral lower extremities Neurological: Alert and oriented x name, moves all extremities x 4 weekly but without focal neurological deficits, CN 2-12 grossly intact  Scheduled Meds:  Scheduled Meds: . dipyridamole-aspirin  1 capsule Oral BID  . docusate sodium  100 mg Oral BID  . feeding supplement  30 mL Oral TID WC  . gabapentin  100 mg Oral Daily  . heparin  5,000 Units Subcutaneous Q8H  . lanthanum  500 mg Oral TID WC  . mirtazapine  7.5 mg Oral QHS  . multivitamin  1 tablet Oral QHS  . piperacillin-tazobactam (ZOSYN)  IV  2.25 g Intravenous Q8H  . saccharomyces boulardii  250 mg Oral BID  . vancomycin  1,000 mg Intravenous Q M,W,F-HD   Continuous Infusions: . sodium chloride 50 mL/hr (05/12/13 0959)    **Reviewed in detail by the Attending Physicia  Data Reviewed: Basic Metabolic Panel:  Recent Labs Lab 05/11/13 0045 05/11/13 1150 05/11/13 1547 05/12/13 0715  NA  --  131* 133* 139  K  --  5.5* 5.7* 3.7  CL  --  89* 94* 108  CO2  --  25 25 19   GLUCOSE  --  133* 126* 75  BUN  --  69* 68* 25*  CREATININE 3.11* 5.54* 5.83* 2.58*  CALCIUM  --  10.0 9.9 7.0*  PHOS  --   --  6.3*  --    Liver Function Tests:  Recent Labs Lab 05/11/13 1150 05/11/13 1547  AST 30  --   ALT 19  --   ALKPHOS 83  --   BILITOT 0.4  --   PROT 9.6*  --   ALBUMIN 2.5* 2.2*   No results found for this basename: LIPASE, AMYLASE,  in the last 168 hours No results found for this basename: AMMONIA,  in the last 168 hours CBC:  Recent Labs Lab 05/11/13 0045 05/11/13 1150 05/12/13 1115  WBC 13.3* 16.4* 12.5*  NEUTROABS  --  14.9*  --   HGB 11.0* 11.9* 11.0*  HCT 33.3* 36.6* 33.8*  MCV 77.1* 76.9* 77.7*  PLT 442* 485* 377   Cardiac Enzymes: No results found for this basename: CKTOTAL, CKMB, CKMBINDEX, TROPONINI,  in the last 168 hours BNP (last 3 results) No results found for this basename: PROBNP,  in the last 8760 hours CBG:  Recent Labs Lab 05/11/13 2156  05/12/13 0012 05/12/13 0348 05/12/13 0859 05/12/13 1207  GLUCAP 125* 117* 112* 105* 140*    Recent Results (from the past 240 hour(s))  CULTURE, BLOOD (ROUTINE X 2)     Status: None   Collection Time    05/11/13 12:10 PM      Result Value Range Status   Specimen Description BLOOD LEFT ANTECUBITAL   Final   Special Requests BOTTLES DRAWN AEROBIC AND ANAEROBIC 10CC   Final   Culture  Setup Time     Final   Value: 05/11/2013 16:38     Performed at [facility]   Culture     Final   Value:        BLOOD CULTURE RECEIVED NO GROWTH TO DATE CULTURE WILL BE HELD FOR 5 DAYS BEFORE ISSUING A FINAL NEGATIVE REPORT     Performed at [facility]   Report Status PENDING  Incomplete  CULTURE, BLOOD (ROUTINE X 2)     Status: None   Collection Time    05/11/13 12:20 PM      Result Value Range Status   Specimen Description BLOOD LEFT ANTECUBITAL   Final   Special Requests BOTTLES DRAWN AEROBIC AND ANAEROBIC 10CC   Final   Culture  Setup Time     Final   Value: 05/11/2013 16:38     Performed at [facility]   Culture     Final   Value:        BLOOD CULTURE RECEIVED NO GROWTH TO DATE CULTURE WILL BE HELD FOR 5 DAYS BEFORE ISSUING A FINAL NEGATIVE REPORT     Performed at [facility]   Report Status PENDING   Incomplete  MRSA PCR SCREENING     Status: None   Collection Time    05/11/13  9:48 PM      Result Value Range Status   MRSA by PCR NEGATIVE  NEGATIVE Final   Comment:            The GeneXpert MRSA Assay (FDA     approved for NASAL specimens     only), is one component of a     comprehensive MRSA colonization     surveillance program. It is not     intended to diagnose MRSA     infection nor to guide or     monitor treatment for     MRSA infections.     Studies:  Recent x-ray studies have been reviewed in detail by the Attending Physician    Junious Silk, ANP [facility] Office  [phone] Pager [phone]  **If unable to  reach the above provider after paging please contact the Flow Manager @ [phone]  On-Call/Text Page:      [email or web address]      password [email or web address]  If 7PM-7AM, please contact night-coverage [email or web address] Password [email or web address] 05/12/2013, 12:55 PM   LOS: 1 day   I have examined the patient, reviewed the chart and modified the above note which I agree with.   Odelia Graciano,MD [phone] 05/12/2013, 7:20 PM

## 2013-05-12 NOTE — Progress Notes (Signed)
Patient transferred to the floor at 15:15.  Patient transferred from regular mattress to air overlay mattress.  Patient has 8 bedsores: all are currently covered with foam dressings.  Vital signs stable.  Patient oriented to unit and call light within reach.  Patient resting comfortably.  Will continue to monitor.

## 2013-05-12 NOTE — Consult Note (Addendum)
Wound care follow-up: Refer to WOC progress notes from 10/1.  Air mattress has been ordered for bed.  Stage IV sacral pressure ulcer: no change from previous assessment, Sacral dressing changed with moist gauze and abdominal pad. Pt incontinent of stool and difficult to keep wound from soiling. Below wounds were not noted on yesterday's assessment: Left lower leg full thickness 12 X 3.5, 50% dry eschar, 40% moist and red, 10% dry yellow. No drainage or odor present. Left calf full thickness 6 X 2, 100% eschar. No odor present. Small amount of serous drainage present.  Refer to previous progress notes for full wound assessment and plan of care. Please re-consult if further assistance is needed.  Thank-you,  Cammie Mcgee MSN, RN, CWOCN, [place], CNS [phone]

## 2013-05-12 NOTE — Progress Notes (Signed)
Subjective:  Very weak, mild chills, but denies pain or dyspnea  Objective: Vital signs in last 24 hours: Temp:  [98 F (36.7 C)-103.4 F (39.7 C)] 100.1 F (37.8 C) (10/02 0356) Pulse Rate:  [62-137] 110 (10/02 0600) Resp:  [16-36] 25 (10/02 0600) BP: (84-177)/(36-94) 112/67 mmHg (10/02 0600) SpO2:  [91 %-100 %] 100 % (10/02 0600) Weight:  [74 kg (163 lb 2.3 oz)-82.3 kg (181 lb 7 oz)] 74.3 kg (163 lb 12.8 oz) (10/02 0600) Weight change:   Intake/Output from previous day: 10/01 0701 - 10/02 0700 In: 950 [I.V.:450; IV Piggyback:500] Out: 678 [Urine:40]   EXAM: General appearance: Somnolent, but able to arouse, cachectic, no apparent distress Resp:  CTA without rales, rhonchi, or wheezes Cardio:  Tachycardic, no murmur or rub GI:  + BS, soft and nontender Extremities:  No edema, dressings over LE wounds Access:  AVF @ LFA with + bruit    Lab Results:  Recent Labs  05/11/13 0045 05/11/13 1150  WBC 13.3* 16.4*  HGB 11.0* 11.9*  HCT 33.3* 36.6*  PLT 442* 485*   BMET:  Recent Labs  05/11/13 1150 05/11/13 1547  NA 131* 133*  K 5.5* 5.7*  CL 89* 94*  CO2 25 25  GLUCOSE 133* 126*  BUN 69* 68*  CREATININE 5.54* 5.83*  CALCIUM 10.0 9.9  ALBUMIN 2.5* 2.2*   No results found for this basename: PTH,  in the last 72 hours Iron Studies: No results found for this basename: IRON, TIBC, TRANSFERRIN, FERRITIN,  in the last 72 hours  Dialysis Orders: [facility]WF [facility]  3:45 78 kg 2K/2Ca 500/A1.5 Heparin 5000 U AVF @ LFA  Hectorol 1 mcg Epogen 13,000 U Venofer 0  Assessment/Plan: 1. Fever/leukocytosis - likely sepsis, T 102.9 yesterday (100.1 most recently), WBCs 16.4; multiple pressure ulcers, including sacral decubitus as possible source; also with Foley catheter, started Augmentin  for UTI 9/30 per SNF, now on Vancomycin & Zosyn since 10/1; BCs pending. 2. Stage 4 sacral decubitus/bilateral LE pressure ulcers - necrotic wounds on lower extremities, wound care consulted. 3. ESRD  - HD on MWF @ [place]; K 5.7 pre-HD yesterday.  BMET pending, next HD tomorrow. 4. Hypertension/volume - BP 112/67 most recently, no meds; large discrepancy with wts (74.3 kg today, 82.1 yesterday) s/p net UF only 638 ml sec to low BPs (EDW 78). 5. Anemia - Hgb 11.9 on outpatient Epogen 13,000 U; last T-sat 12% with ferritin 1578. 6. Metabolic bone disease - Ca 9.9 (11.3 corrected), P 6.3, iPTH 94; 2Ca bath, Hectorol 1 mcg, Sensipar stopped, Fosrenol 500 mg with meals.  DC Hectorol. 7. Nutrition - Alb 2.4, renal diet, multivitamin.     LOS: 1 day   [name] 05/12/2013,8:10 AM

## 2013-05-12 NOTE — Evaluation (Signed)
Clinical/Bedside Swallow Evaluation Patient Details  Name: [name] MRN: [id number] Date of Birth: 06-Feb-1931  Today's Date: 05/12/2013 Time:  -     Past Medical History:  Past Medical History  Diagnosis Date  . Hypertension   . Diabetes mellitus     typeII / No meds  . Cataract 01/2002    Right  . Dialysis patient 10/13/2007  . Radiculopathy 05/14/2006    NCV study negative carpal tunnel, Left C5 radiculopathy  . Arthritis     Knee pain  . ESRD (end stage renal disease) on dialysis 2009    [name] Monday , Wed. and Friday per Dr. Kathrene [name]  . Stroke   . Elevated PSA     h/o, pt had declined further eval.   . Syncope 11/02/2012  . Sepsis   . Dementia   . UTI (urinary tract infection)    Past Surgical History:  Past Surgical History  Procedure Laterality Date  . Varicose vein surgery  1978  . External fixation wrist fracture  1990  . Thyroidectomy, partial  08/03/2003    Right thyroid lobectomy, subtotal parathyr. sec hyperparthyroidismadenosis  . Av fistula placement  07/2004    Left arm AV fistula placement [name])  . Eye surgery      Cataract  . Wound debridement N/A 02/08/2013    Procedure: DEBRIDEMENT WOUND;  Surgeon: Wilmon Arms. Corliss Skains, MD;  Location: [facility] OR;  Service: General;  Laterality: N/A;   HPI:  79 year old male with PMH of ESRD, MRSA bacteremia presents to ED with fever, AMS. Diagnosed with sepsis, ? UTI although PNA not ruled out. CXR with Mild right basilar atelectasis.   Assessment / Plan / Recommendation Clinical Impression  Patient presesnts with a functional oropharyngeal swallow without overt evidence of aspiration. Suspect that pocketing of pills reported by RN may be due to AMS. Recommend continuation of a regular diet, thin liquid with general safe swallowing precautions, pills crushed in puree if needed. No SLP f/u indicated at this time.     Aspiration Risk  Mild    Diet Recommendation Regular;Thin  liquid   Liquid Administration via: Cup;Straw Medication Administration: Crushed with puree (if needed) Supervision: Full supervision/cueing for compensatory strategies Compensations: Slow rate;Small sips/bites Postural Changes and/or Swallow Maneuvers: Seated upright 90 degrees    Other  Recommendations Oral Care Recommendations: Oral care BID   Follow Up Recommendations  None    Frequency and Duration        Pertinent Vitals/Pain none        Swallow Study    General HPI: 79 year old male with PMH of ESRD, MRSA bacteremia presents to ED with fever, AMS. Diagnosed with sepsis, ? UTI although PNA not ruled out. CXR with Mild right basilar atelectasis. Type of Study: Bedside swallow evaluation Previous Swallow Assessment: none Diet Prior to this Study: Regular;Thin liquids Temperature Spikes Noted: Yes Respiratory Status: Supplemental O2 delivered via (comment) (3 L nasal cannula) History of Recent Intubation: No Behavior/Cognition: Alert;Cooperative;Pleasant mood;Confused Oral Cavity - Dentition: Adequate natural dentition Self-Feeding Abilities: Needs assist Patient Positioning: Upright in bed Baseline Vocal Quality: Hoarse Volitional Cough: Weak Volitional Swallow: Able to elicit    Oral/Motor/Sensory Function Overall Oral Motor/Sensory Function: Appears within functional limits for tasks assessed   Ice Chips Ice chips: Not tested   Thin Liquid Thin Liquid: Within functional limits    Nectar Thick Nectar Thick Liquid: Not tested   Honey Thick Honey Thick Liquid: Not tested  Puree Puree: Within functional limits   Solid   GO   Patrick Brossard MA, CCC-SLP [phone]  Solid: Within functional limits       [name] 05/12/2013,3:02 PM

## 2013-05-12 NOTE — Care Management Note (Addendum)
    Page 1 of 1   05/16/2013     2:03:15 PM   CARE MANAGEMENT NOTE 05/16/2013  Patient:  [name], [name]   Account Number:  [id number]  Date Initiated:  05/12/2013  Documentation initiated by:  Junius Creamer  Subjective/Objective Assessment:   adm w sepsis     Action/Plan:   from nsg facility   Anticipated DC Date:  05/16/2013   Anticipated DC Plan:  SKILLED NURSING FACILITY  In-house referral  Clinical Social Worker      DC Planning Services  CM consult      Choice offered to / List presented to:             Status of service:  Completed, signed off Medicare Important Message given?   (If response is "NO", the following Medicare IM given date fields will be blank) Date Medicare IM given:   Date Additional Medicare IM given:    Discharge Disposition:  SKILLED NURSING FACILITY  Per UR Regulation:  Reviewed for med. necessity/level of care/duration of stay  If discussed at Long Length of Stay Meetings, dates discussed:    Comments:  05/16/13 13:52 Letha Cape RN, BSN [phone] patient is from [facility] SNF, patient is for dc back to [facility], CSW following.

## 2013-05-12 NOTE — Progress Notes (Signed)
Pt with BP  84/36, ST 120 (down form 130s) receiving NS at 50cc/hr.  No u/o since HD except for small bloody drainage in foley tubing..  Above  Info texted to Maren Reamer, NP.  Orders received for NS 500 cc bolus.  Will continue to monitor pt

## 2013-05-12 NOTE — Progress Notes (Signed)
Pt responded to NS 500 cc bolus with BP 111/67. HR 115 ST

## 2013-05-12 NOTE — Clinical Social Work Note (Addendum)
11:24am, 05/12/13: According to note from previous admission (01/2013), patient from [facility] [place].   Pt asleep when CSW visited this morning; CSW alerted via [facility] that patient is from a SNF, but that the name of the facility is not in chart. CSW called pt's wife and left her a voicemail, asking that she call CSW back and provide the name of the SNF.   Maryclare Labrador, MSW, [facility] Clinical Social Worker [phone]

## 2013-05-12 NOTE — Clinical Social Work Psychosocial (Signed)
Clinical Social Work Department BRIEF PSYCHOSOCIAL ASSESSMENT 05/12/2013  Patient:  [name], [name]     Account Number:  [id number]     Admit date:  05/11/2013  Clinical Social Worker:  Varney Biles  Date/Time:  05/12/2013 02:31 PM  Referred by:  Physician  Date Referred:  05/12/2013 Referred for  SNF Placement  Psychosocial assessment   Other Referral:   Interview type:  Patient Other interview type:   Also spoke with patient's daughter and spouse over the phone.    PSYCHOSOCIAL DATA Living Status:  FACILITY Admitted from facility:  [facility] [place] Level of care:  Skilled Nursing Facility Primary support name:  [name] Primary support relationship to patient:  SPOUSE Degree of support available:   Good--pt lived at [facility] SNF before admission, and has support from his wife [name] and his daughter [name].    CURRENT CONCERNS Current Concerns  Post-Acute Placement   Other Concerns:    SOCIAL WORK ASSESSMENT / PLAN Pt from [facility] SNF. Pt initially told CSW that he lived with his wife at home before hospital admission; CSW received permission from pt to call his wife, and per wife pt was at [facility] before admission. Per pt, his wife [name] and daughter [name] are his primary supports. Pt also has a son, [name]. CSW verified with pt's wife that they would like pt to return to [facility] if there is a bed available for him. CSW called [facility], and they verified that he came from their facility and they will take him back when he is medically ready for discharge.   Assessment/plan status:  Psychosocial Support/Ongoing Assessment of Needs Other assessment/ plan:   Information/referral to community resources:   SNF [facility]).    PATIENT'S/FAMILY'S RESPONSE TO PLAN OF CARE: Pt was receptive to CSW visit; also expressed gratitude, as CSW held the phone to his ear so he could have a  conversation with his wife and daughter. Pt family also receptive to CSW role, and supportive of pt going back to SNF.       Maryclare Labrador, MSW, [facility] Clinical Social Worker [phone]

## 2013-05-12 NOTE — Progress Notes (Signed)
Patient ID: [name], male   DOB: 01-03-1931, 79 y.o.   MRN: [id number]  [facility] Progress Note    Subjective:   More awake and alert today, no rigors but still confused   Objective:   BP 117/69  Pulse 110  Temp(Src) 97.6 F (36.4 C) (Oral)  Resp 26  Ht 6\' 4"  (1.93 m)  Wt 74.3 kg (163 lb 12.8 oz)  BMI 19.95 kg/m2  SpO2 100%  Intake/Output: I/O last 3 completed shifts: In: 950 [I.V.:450; IV Piggyback:500] Out: 678 [Urine:40; Other:638]   Intake/Output this shift:  Total I/O In: 50 [I.V.:50] Out: -  Weight change:   Physical Exam: WUJ:WJXBJ, chronically ill-appearing YNW:GNFAO Resp:occ rhonchi ZHY:QMVHQI ONG:EXBM arm AVF +T/B,no erythema or fluctuance.  Bilateral calf decubiti, sacral decub, ischemic changes to right great toe, bilateral heal decubiti right >left  Labs: BMET  Recent Labs Lab 05/11/13 0045 05/11/13 1150 05/11/13 1547 05/12/13 0715  NA  --  131* 133* 139  K  --  5.5* 5.7* 3.7  CL  --  89* 94* 108  CO2  --  25 25 19   GLUCOSE  --  133* 126* 75  BUN  --  69* 68* 25*  CREATININE 3.11* 5.54* 5.83* 2.58*  ALBUMIN  --  2.5* 2.2*  --   CALCIUM  --  10.0 9.9 7.0*  PHOS  --   --  6.3*  --    CBC  Recent Labs Lab 05/11/13 0045 05/11/13 1150  WBC 13.3* 16.4*  NEUTROABS  --  14.9*  HGB 11.0* 11.9*  HCT 33.3* 36.6*  MCV 77.1* 76.9*  PLT 442* 485*    @IMGRELPRIORS @ Medications:    . dipyridamole-aspirin  1 capsule Oral BID  . docusate sodium  100 mg Oral BID  . feeding supplement  30 mL Oral TID WC  . gabapentin  100 mg Oral Daily  . heparin  5,000 Units Subcutaneous Q8H  . lanthanum  500 mg Oral TID WC  . mirtazapine  7.5 mg Oral QHS  . multivitamin  1 tablet Oral QHS  . piperacillin-tazobactam (ZOSYN)  IV  2.25 g Intravenous Q8H  . saccharomyces boulardii  250 mg Oral BID  . vancomycin  1,000 mg Intravenous Q M,W,F-HD     Assessment/ Plan:   1. Fever/leukocytosis - likely sepsis, T 102.9 yesterday (100.1  most recently), WBCs 16.4; multiple pressure ulcers, including sacral decubitus as possible source; also with Foley catheter, started Augmentin for UTI 9/30 per SNF, now on Vancomycin & Zosyn since 10/1; BCs pending. 2. Stage 4 sacral decubitus/bilateral LE pressure ulcers - necrotic wounds on lower extremities, wound care consulted. 3. ESRD - HD on MWF @ [place]; K 5.7 pre-HD yesterday. BMET pending, next HD tomorrow. 4. Hypertension/volume - BP 112/67 most recently, no meds; large discrepancy with wts (74.3 kg today, 82.1 yesterday) s/p net UF only 638 ml sec to low BPs (EDW 78). 5. Anemia - Hgb 11.9 on outpatient Epogen 13,000 U; last T-sat 12% with ferritin 1578. 6. Metabolic bone disease - Ca 9.9 (11.3 corrected), P 6.3, iPTH 94; 2Ca bath, Hectorol 1 mcg, Sensipar stopped, Fosrenol 500 mg with meals. DC Hectorol. 7. Nutrition - Alb 2.4, renal diet, multivitamin. 8. Disposition- poor prognosis and may benefit from palliative care consult to help set goals/limits of care. Kazoua Gossen A 05/12/2013, 9:56 AM

## 2013-05-13 ENCOUNTER — Inpatient Hospital Stay (HOSPITAL_COMMUNITY): Payer: Medicare Other

## 2013-05-13 DIAGNOSIS — D631 Anemia in chronic kidney disease: Secondary | ICD-10-CM

## 2013-05-13 DIAGNOSIS — N189 Chronic kidney disease, unspecified: Secondary | ICD-10-CM

## 2013-05-13 DIAGNOSIS — R82998 Other abnormal findings in urine: Secondary | ICD-10-CM

## 2013-05-13 DIAGNOSIS — J9600 Acute respiratory failure, unspecified whether with hypoxia or hypercapnia: Secondary | ICD-10-CM

## 2013-05-13 LAB — RENAL FUNCTION PANEL
BUN: 45 mg/dL — ABNORMAL HIGH (ref 6–23)
CO2: 26 mEq/L (ref 19–32)
Chloride: 98 mEq/L (ref 96–112)
GFR calc Af Amer: 12 mL/min — ABNORMAL LOW (ref >90)
Glucose, Bld: 78 mg/dL (ref 70–99)
Phosphorus: 6.9 mg/dL — ABNORMAL HIGH (ref 2.3–4.6)
Potassium: 4.1 mEq/L (ref 3.5–5.1)
Sodium: 136 mEq/L (ref 135–145)

## 2013-05-13 LAB — CBC
Hemoglobin: 9.4 g/dL — ABNORMAL LOW (ref 13.0–17.0)
RBC: 3.78 MIL/uL — ABNORMAL LOW (ref 4.22–5.81)
WBC: 12.1 10*3/uL — ABNORMAL HIGH (ref 4.0–10.5)

## 2013-05-13 MED ORDER — ALTEPLASE 2 MG IJ SOLR
2.0000 mg | Freq: Once | INTRAMUSCULAR | Status: DC | PRN
  Filled 2013-05-13: qty 2

## 2013-05-13 MED ORDER — SODIUM CHLORIDE 0.9 % IV SOLN
500.0000 mg | INTRAVENOUS | Status: AC
  Administered 2013-05-13: 500 mg via INTRAVENOUS
  Filled 2013-05-13: qty 500

## 2013-05-13 MED ORDER — LIDOCAINE-PRILOCAINE 2.5-2.5 % EX CREA: 1.0000 "application " | TOPICAL_CREAM | CUTANEOUS | Status: DC | PRN

## 2013-05-13 MED ORDER — HEPARIN SODIUM (PORCINE) 1000 UNIT/ML DIALYSIS
5000.0000 [IU] | Freq: Once | INTRAMUSCULAR | Status: AC
  Administered 2013-05-13: 5000 [IU] via INTRAVENOUS_CENTRAL

## 2013-05-13 MED ORDER — PENTAFLUOROPROP-TETRAFLUOROETH EX AERO: 1.0000 "application " | INHALATION_SPRAY | CUTANEOUS | Status: DC | PRN

## 2013-05-13 MED ORDER — NEPRO/CARBSTEADY PO LIQD: 237.0000 mL | ORAL | Status: DC | PRN

## 2013-05-13 MED ORDER — SODIUM CHLORIDE 0.9 % IV SOLN: 100.0000 mL | INTRAVENOUS | Status: DC | PRN

## 2013-05-13 MED ORDER — HEPARIN SODIUM (PORCINE) 1000 UNIT/ML DIALYSIS: 1000.0000 [IU] | INTRAMUSCULAR | Status: DC | PRN

## 2013-05-13 MED ORDER — VANCOMYCIN HCL IN DEXTROSE 750-5 MG/150ML-% IV SOLN: 750.0000 mg | INTRAVENOUS | Status: DC

## 2013-05-13 MED ORDER — LIDOCAINE HCL (PF) 1 % IJ SOLN: 5.0000 mL | INTRAMUSCULAR | Status: DC | PRN

## 2013-05-13 NOTE — Clinical Social Work Note (Signed)
CSW confirmed that [facility] is holding bed for patient. Admission coordinator confirmed that patient can be accepted back to facility over weekend if patient is stable for DC.   Roddie Mc, [place], [place], [phone]

## 2013-05-13 NOTE — Progress Notes (Signed)
ANTIBIOTIC CONSULT NOTE - FOLLOW UP  Pharmacy Consult for Vancomycin + Zosyn Indication: rule out sepsis  No Known Allergies  Patient Measurements: Height: 6\' 4"  (193 cm) Weight: 149 lb 11.1 oz (67.9 kg) IBW/kg (Calculated) : 86.8  Vital Signs: Temp: 98.3 F (36.8 C) (10/03 1105) Temp src: Oral (10/03 1105) BP: 90/54 mmHg (10/03 1105) Pulse Rate: 101 (10/03 1105) Intake/Output from previous day: 10/02 0701 - 10/03 0700 In: 700 [I.V.:600; IV Piggyback:100] Out: 200 [Urine:200] Intake/Output from this shift: Total I/O In: -  Out: 1990 [Other:1990]  Labs:  Recent Labs  05/11/13 1150 05/11/13 1547 05/12/13 0715 05/12/13 1115 05/13/13 0725 05/13/13 0726  WBC 16.4*  --   --  12.5*  --  12.1*  HGB 11.9*  --   --  11.0*  --  9.4*  PLT 485*  --   --  377  --  379  CREATININE 5.54* 5.83* 2.58*  --  4.69*  --    Estimated Creatinine Clearance: 12.1 ml/min (by C-G formula based on Cr of 4.69). No results found for this basename: VANCOTROUGH, VANCOPEAK, VANCORANDOM, GENTTROUGH, GENTPEAK, GENTRANDOM, TOBRATROUGH, TOBRAPEAK, TOBRARND, AMIKACINPEAK, AMIKACINTROU, AMIKACIN,  in the last 72 hours   Microbiology: Recent Results (from the past 720 hour(s))  CULTURE, BLOOD (ROUTINE X 2)     Status: None   Collection Time    05/11/13 12:10 PM      Result Value Range Status   Specimen Description BLOOD LEFT ANTECUBITAL   Final   Special Requests BOTTLES DRAWN AEROBIC AND ANAEROBIC 10CC   Final   Culture  Setup Time     Final   Value: 05/11/2013 16:38     Performed at [facility]   Culture     Final   Value:        BLOOD CULTURE RECEIVED NO GROWTH TO DATE CULTURE WILL BE HELD FOR 5 DAYS BEFORE ISSUING A FINAL NEGATIVE REPORT     Performed at [facility]   Report Status PENDING   Incomplete  CULTURE, BLOOD (ROUTINE X 2)     Status: None   Collection Time    05/11/13 12:20 PM      Result Value Range Status   Specimen Description BLOOD LEFT ANTECUBITAL   Final    Special Requests BOTTLES DRAWN AEROBIC AND ANAEROBIC 10CC   Final   Culture  Setup Time     Final   Value: 05/11/2013 16:38     Performed at [facility]   Culture     Final   Value:        BLOOD CULTURE RECEIVED NO GROWTH TO DATE CULTURE WILL BE HELD FOR 5 DAYS BEFORE ISSUING A FINAL NEGATIVE REPORT     Performed at [facility]   Report Status PENDING   Incomplete  MRSA PCR SCREENING     Status: None   Collection Time    05/11/13  9:48 PM      Result Value Range Status   MRSA by PCR NEGATIVE  NEGATIVE Final   Comment:            The GeneXpert MRSA Assay (FDA     approved for NASAL specimens     only), is one component of a     comprehensive MRSA colonization     surveillance program. It is not     intended to diagnose MRSA     infection nor to guide or     monitor treatment for  MRSA infections.  URINE CULTURE     Status: None   Collection Time    05/12/13  4:13 AM      Result Value Range Status   Specimen Description URINE, RANDOM   Final   Special Requests NONE   Final   Culture  Setup Time     Final   Value: 05/12/2013 09:14     Performed at [facility]   Colony Count PENDING   Incomplete   Culture     Final   Value: Culture reincubated for better growth     Performed at [facility]   Report Status PENDING   Incomplete    Anti-infectives   Start     Dose/Rate Route Frequency Ordered Stop   05/16/13 1200  vancomycin (VANCOCIN) IVPB 750 mg/150 ml premix     750 mg 150 mL/hr over 60 Minutes Intravenous Every M-W-F (Hemodialysis) 05/13/13 0800     05/13/13 1200  vancomycin (VANCOCIN) 500 mg in sodium chloride 0.9 % 100 mL IVPB     500 mg 100 mL/hr over 60 Minutes Intravenous Every Fri (Hemodialysis) 05/13/13 0800 05/13/13 1059   05/11/13 2000  piperacillin-tazobactam (ZOSYN) IVPB 2.25 g     2.25 g 100 mL/hr over 30 Minutes Intravenous Every 8 hours 05/11/13 1121     05/11/13 1200  vancomycin (VANCOCIN) IVPB 1000 mg/200 mL  premix  Status:  Discontinued     1,000 mg 200 mL/hr over 60 Minutes Intravenous Every M-W-F (Hemodialysis) 05/11/13 1121 05/13/13 0800   05/11/13 1130  vancomycin (VANCOCIN) 1,750 mg in sodium chloride 0.9 % 500 mL IVPB     1,750 mg 250 mL/hr over 120 Minutes Intravenous STAT 05/11/13 1119 05/11/13 1436   05/11/13 1130  piperacillin-tazobactam (ZOSYN) IVPB 3.375 g     3.375 g 100 mL/hr over 30 Minutes Intravenous STAT 05/11/13 1120 05/11/13 1549      Assessment: 79 y.o. M who continues on Vancomycin + Zosyn D#3 for r/o sepsis. The patient was loaded with Vancomycin 1750 mg x 1 on 10/1 and received 1g post HD that afternoon (based on admitting weight of 91 kg). The patient's weight has been up and down this admit and is currently at 67.9 kg -- given this and the patient only tolerating 3.15 hours of HD on 10/1 -- will reduce Vancomycin dose post-HD today to 500 mg x 1, then will plan to resume normal maintenance doses post HD on Mon, 10/6. Zosyn dose remains appropriate.   Vanc 10/1 >> Zosyn 10/1 >> Augmentin 9/30 >> 10/1  10/1 - blood x 2 - ngtd 10/2 - urine - 10/1 - MRSA screen neg   Goal of Therapy:  Pre-HD Vancomycin level of 15-25 mcg/ml Proper antibiotics for infection/cultures adjusted for renal/hepatic function   Plan:  1. Reduce Vancomycin to 500 mg x 1 dose post HD today (Fri, 10/3) 2. Adjust Vancomycin to 750 mg post HD-MWF (starting Mon, 10/6) 3. Continue Zosyn 2.25g IV every 8 hours 4. Will continue to follow HD schedule/duration, culture results, LOT, and antibiotic de-escalation plans   Georgina Pillion, PharmD, BCPS Clinical Pharmacist Pager: [phone] 05/13/2013 1:23 PM

## 2013-05-13 NOTE — Progress Notes (Signed)
Patient refused sacrum dressing change.  Patient agitated and said that "he did not want to be bothered."  Also refused to turn or be repositioned.

## 2013-05-13 NOTE — Progress Notes (Addendum)
[facility] Progress Note   Faisal Stradling ZOX:[id number] DOB: 1931/05/05 DOA: 05/11/2013 PCP: Crawford Givens, MD  HPI/Subjective: No significant complaints. Confused.  Brief narrative: 79 year old male patient with known history of chronic kidney disease on dialysis and a stage IV decubitus ulcer, bed bound. Also history of C. difficile in the past 12 months. He presented with fever altered mental status. Apparently the fevers have been ongoing for at least 48 hours. His temperature was 102. This patient does have a prior history of MRSA bacteremia had recently completed antibiotics for this and presumed osteomyelitis. Had been evaluated by the infectious disease service at last admission. At the skilled nursing facility he was apparently diagnosed with urinary tract infection on 9/30 and had been started on Augmentin.  Assessment/Plan: Active Problems:  Sepsis- likely from UTI -Presumed urinary source, repeat x-ray still negative for pneumonia. - also, must keep in mind that he previously had infected decubitus ulcer  -Continue current antibiotics, followup on cultures  UTIs/Fever -as above  Dehydration -Continue gentle IVF -  Acute respiratory failure with hypoxia -? due to hypoventilation from AMS/sepsis -repeat CXR in am -wean O2 as tolerated  Diabetes mellitus with end stage renal disease -well controlled -was not on meds pre admit  HYPERTENSION -BP soft at this time -was not on meds pre admit  CKD (chronic kidney disease) stage V requiring chronic dialysis -HD per nephrology  Anemia in chronic kidney disease -hgb stable  History of CVA (cerebrovascular accident) -RN notes pt pocketing meds so will ask SLP to evaluate  Decubitus ulcer of sacral region, stage 4/Decubitus ulcer of right heel, unstageable  -chronic and required surgical debridement last admission (June 2014) -suspected had underlying osteomyelitis so given prolonged anbx's IV as recommended by  ID  -WOC RN eval    Protein-calorie malnutrition, severe -As above re ?? Dysphagia -agree with nephro has poor prognosis and may benefit from Palliative eval this admit if does not improve.   DVT prophylaxis: Subcutaneous heparin Code Status: DO NOT RESUSCITATE Family Communication: No family at bedside Disposition Plan/Expected LOS: Transfer to floor Isolation: History of MRSA so contact isolation initiated although MRSA PCR swab was negative Nutritional Status: Acute and chronic protein calorie malnutrition as evidenced by suspected weight loss. Visual inspection of patient she was a patient at clearly appears to be at a lower weight than the picture that is listed on his intake information from the nursing facility  Consultants: Nephrology  Procedures: None  Antibiotics: Zosyn 10/1 >>> Vancomycin 10/1 >>>   Objective: Blood pressure 90/54, pulse 101, temperature 98.3 F (36.8 C), temperature source Oral, resp. rate 26, height 6\' 4"  (1.93 m), weight 67.9 kg (149 lb 11.1 oz), SpO2 96.00%.  Intake/Output Summary (Last 24 hours) at 05/13/13 1429 Last data filed at 05/13/13 1105  Gross per 24 hour  Intake    450 ml  Output   2190 ml  Net  -1740 ml     Exam: General: No acute respiratory distress-oral mucous membranes are dry Lungs: Clear to auscultation bilaterally without wheezes or crackles, diminished through bases, 3L Cardiovascular: Regular rate and her sugar he AV block without murmur gallop or rub normal S1 and S2, no peripheral edema or JVD Abdomen: Nontender, nondistended, soft, bowel sounds positive, no rebound, no ascites, no appreciable mass Musculoskeletal: No significant cyanosis, clubbing of bilateral lower extremities Neurological: Alert and oriented x name, moves all extremities x 4 weekly but without focal neurological deficits, CN 2-12 grossly intact  Scheduled Meds:  Scheduled  Meds: . dipyridamole-aspirin  1 capsule Oral BID  . docusate sodium  100  mg Oral BID  . feeding supplement  30 mL Oral TID WC  . gabapentin  100 mg Oral Daily  . heparin  5,000 Units Subcutaneous Q8H  . lanthanum  500 mg Oral TID WC  . mirtazapine  7.5 mg Oral QHS  . multivitamin  1 tablet Oral QHS  . piperacillin-tazobactam (ZOSYN)  IV  2.25 g Intravenous Q8H  . saccharomyces boulardii  250 mg Oral BID  . [START ON 05/16/2013] vancomycin  750 mg Intravenous Q M,W,F-HD   Continuous Infusions: . sodium chloride 50 mL/hr (05/12/13 0959)    **Reviewed in detail by the Attending Physicia  Data Reviewed: Basic Metabolic Panel:  Recent Labs Lab 05/11/13 0045 05/11/13 1150 05/11/13 1547 05/12/13 0715 05/13/13 0725  NA  --  131* 133* 139 136  K  --  5.5* 5.7* 3.7 4.1  CL  --  89* 94* 108 98  CO2  --  25 25 19 26   GLUCOSE  --  133* 126* 75 78  BUN  --  69* 68* 25* 45*  CREATININE 3.11* 5.54* 5.83* 2.58* 4.69*  CALCIUM  --  10.0 9.9 7.0* 9.7  PHOS  --   --  6.3*  --  6.9*   Liver Function Tests:  Recent Labs Lab 05/11/13 1150 05/11/13 1547 05/13/13 0725  AST 30  --   --   ALT 19  --   --   ALKPHOS 83  --   --   BILITOT 0.4  --   --   PROT 9.6*  --   --   ALBUMIN 2.5* 2.2* 1.9*   No results found for this basename: LIPASE, AMYLASE,  in the last 168 hours No results found for this basename: AMMONIA,  in the last 168 hours CBC:  Recent Labs Lab 05/11/13 0045 05/11/13 1150 05/12/13 1115 05/13/13 0726  WBC 13.3* 16.4* 12.5* 12.1*  NEUTROABS  --  14.9*  --   --   HGB 11.0* 11.9* 11.0* 9.4*  HCT 33.3* 36.6* 33.8* 28.7*  MCV 77.1* 76.9* 77.7* 75.9*  PLT 442* 485* 377 379   Cardiac Enzymes: No results found for this basename: CKTOTAL, CKMB, CKMBINDEX, TROPONINI,  in the last 168 hours BNP (last 3 results) No results found for this basename: PROBNP,  in the last 8760 hours CBG:  Recent Labs Lab 05/11/13 2156 05/12/13 0012 05/12/13 0348 05/12/13 0859 05/12/13 1207  GLUCAP 125* 117* 112* 105* 140*    Recent Results (from the  past 240 hour(s))  CULTURE, BLOOD (ROUTINE X 2)     Status: None   Collection Time    05/11/13 12:10 PM      Result Value Range Status   Specimen Description BLOOD LEFT ANTECUBITAL   Final   Special Requests BOTTLES DRAWN AEROBIC AND ANAEROBIC 10CC   Final   Culture  Setup Time     Final   Value: 05/11/2013 16:38     Performed at [facility]   Culture     Final   Value:        BLOOD CULTURE RECEIVED NO GROWTH TO DATE CULTURE WILL BE HELD FOR 5 DAYS BEFORE ISSUING A FINAL NEGATIVE REPORT     Performed at [facility]   Report Status PENDING   Incomplete  CULTURE, BLOOD (ROUTINE X 2)     Status: None   Collection Time    05/11/13 12:20 PM  Result Value Range Status   Specimen Description BLOOD LEFT ANTECUBITAL   Final   Special Requests BOTTLES DRAWN AEROBIC AND ANAEROBIC 10CC   Final   Culture  Setup Time     Final   Value: 05/11/2013 16:38     Performed at [facility]   Culture     Final   Value:        BLOOD CULTURE RECEIVED NO GROWTH TO DATE CULTURE WILL BE HELD FOR 5 DAYS BEFORE ISSUING A FINAL NEGATIVE REPORT     Performed at [facility]   Report Status PENDING   Incomplete  MRSA PCR SCREENING     Status: None   Collection Time    05/11/13  9:48 PM      Result Value Range Status   MRSA by PCR NEGATIVE  NEGATIVE Final   Comment:            The GeneXpert MRSA Assay (FDA     approved for NASAL specimens     only), is one component of a     comprehensive MRSA colonization     surveillance program. It is not     intended to diagnose MRSA     infection nor to guide or     monitor treatment for     MRSA infections.  URINE CULTURE     Status: None   Collection Time    05/12/13  4:13 AM      Result Value Range Status   Specimen Description URINE, RANDOM   Final   Special Requests NONE   Final   Culture  Setup Time     Final   Value: 05/12/2013 09:14     Performed at [facility]   Colony Count PENDING   Incomplete   Culture      Final   Value: Culture reincubated for better growth     Performed at [facility]   Report Status PENDING   Incomplete     Studies:  Recent x-ray studies have been reviewed in detail by the Attending Physician    [facility] A,MD [phone] 05/13/2013, 2:29 PM

## 2013-05-13 NOTE — Progress Notes (Signed)
Patient ID: [name], male   DOB: 1930/12/29, 79 y.o.   MRN: [id number]  [facility] Progress Note    Subjective:   No complaints   Objective:   BP 114/68  Pulse 95  Temp(Src) 98.5 F (36.9 C) (Oral)  Resp 26  Ht 6\' 4"  (1.93 m)  Wt 69.9 kg (154 lb 1.6 oz)  BMI 18.77 kg/m2  SpO2 98%  Intake/Output: I/O last 3 completed shifts: In: 1650 [I.V.:1050; IV Piggyback:600] Out: 878 [Urine:240; Other:638]   Intake/Output this shift:    Weight change: -12.3 kg (-27 lb 1.9 oz)  Physical Exam: NWG:NFAOZ, chronically ill-appearing in NAD CVS:no rub Resp:cta HYQ:MVHQIO Ext:LUE AVF+T/B, +bilateral calf decubiti, ischemic right great toe, bilateral heal decubiti,  no edema  Labs: BMET  Recent Labs Lab 05/11/13 0045 05/11/13 1150 05/11/13 1547 05/12/13 0715 05/13/13 0725  NA  --  131* 133* 139 136  K  --  5.5* 5.7* 3.7 4.1  CL  --  89* 94* 108 98  CO2  --  25 25 19 26   GLUCOSE  --  133* 126* 75 78  BUN  --  69* 68* 25* 45*  CREATININE 3.11* 5.54* 5.83* 2.58* 4.69*  ALBUMIN  --  2.5* 2.2*  --  1.9*  CALCIUM  --  10.0 9.9 7.0* 9.7  PHOS  --   --  6.3*  --  6.9*   CBC  Recent Labs Lab 05/11/13 0045 05/11/13 1150 05/12/13 1115 05/13/13 0726  WBC 13.3* 16.4* 12.5* 12.1*  NEUTROABS  --  14.9*  --   --   HGB 11.0* 11.9* 11.0* 9.4*  HCT 33.3* 36.6* 33.8* 28.7*  MCV 77.1* 76.9* 77.7* 75.9*  PLT 442* 485* 377 379    @IMGRELPRIORS @ Medications:    . dipyridamole-aspirin  1 capsule Oral BID  . docusate sodium  100 mg Oral BID  . feeding supplement  30 mL Oral TID WC  . gabapentin  100 mg Oral Daily  . heparin  5,000 Units Subcutaneous Q8H  . lanthanum  500 mg Oral TID WC  . mirtazapine  7.5 mg Oral QHS  . multivitamin  1 tablet Oral QHS  . piperacillin-tazobactam (ZOSYN)  IV  2.25 g Intravenous Q8H  . saccharomyces boulardii  250 mg Oral BID  . vancomycin  500 mg Intravenous Q Fri-HD  . [START ON 05/16/2013] vancomycin  750 mg Intravenous Q  M,W,F-HD   Dialysis Orders: [facility]  3:45 78 kg 2K/2Ca 500/A1.5 Heparin 5000 U AVF @ LFA  Hectorol 1 mcg Epogen 13,000 U Venofer 0    Assessment/ Plan:   1. Fever/leukocytosis - likely urosepsis although blood cultures negative (pt was started on augmentin PTA at SNF on 9/30).  Afebrile, WBC's improving as well as his mental status. now on Vancomycin & Zosyn since 10/1; BCs pending. 2. Stage 4 sacral decubitus/bilateral LE pressure ulcers - necrotic wounds on lower extremities, wound care consulted. 3. ESRD - HD on MWF @ [place]; EDW 78kg 4. Hypertension/volume - BP 112/67 most recently, no meds; large discrepancy with wts (69.9 today, 74.3 kg yesterday,and 82.1 prior )  Will follow with HD but UF limited due to low BPs (EDW 78). 5. Anemia - Hgb 11.9 on outpatient Epogen 13,000 U; last T-sat 12% with ferritin 1578. 6. Metabolic bone disease - Ca 9.9 (11.3 corrected), P 6.3, iPTH 94; 2Ca bath, Hectorol 1 mcg, Sensipar stopped, Fosrenol 500 mg with meals. DC Hectorol. 7. Nutrition - Alb 2.4, renal diet, multivitamin.  8. Disposition- poor prognosis and may benefit from palliative care consult to help set goals/limits of care. 9.  [name] A 05/13/2013, 8:52 AM

## 2013-05-13 NOTE — Procedures (Signed)
Patient was seen on dialysis and the procedure was supervised. BFR 400 Via AVF BP is 114/68.  Patient appears to be tolerating treatment well.

## 2013-05-14 NOTE — Progress Notes (Signed)
Subjective:   Pleasantly confused, no complaints  Objective Filed Vitals:   05/13/13 1100 05/13/13 1105 05/13/13 2107 05/14/13 0624  BP: 99/58 90/54 113/55 122/78  Pulse: 103 101 103 94  Temp:  98.3 F (36.8 C) 97.8 F (36.6 C) 97.9 F (36.6 C)  TempSrc:  Oral Oral Oral  Resp: 24 26 22 22   Height:      Weight:  67.9 kg (149 lb 11.1 oz)    SpO2:  96% 100% 90%   Physical Exam General: Oriented only to self. No acute distress.  Heart: RRR no murmur Lungs: CTA, unlabored Abdomen: soft, nontender. +BS Extremities: No edema. Multiple areas of skin breakdown- Sacrum stage 4, R hip stage 2, R calf unstageable, Bilat heel unstageable.  Dialysis Access:  LFA  AVF +bruit/thrill.  Dialysis Orders: MWF [facility]  3:45 78 kg 2K/2Ca 500/A1.5 Heparin 5000 U AVF @ LFA  Hectorol 1 mcg Epogen 13,000 U Venofer 0   Assessment/Plan: 1. Fever/leukocytosis - likely urosepsis although blood cultures negative (pt was started on augmentin PTA at SNF on 9/30). Urine culture pending. Afebrile, WBC's improving, 12.1. now on Vancomycin & Zosyn since 10/1; BCs no growth to date. Chest xray 10.3- bibasilar atelectatsis, unchanged from 10/1 2. Stage 4 sacral decubitus/bilateral LE pressure ulcers - necrotic wounds on lower extremities, wound care consulted.  3. ESRD - HD on MWF @ [place]; EDW 78kg  K+4.1 4. Hypertension/volume - BP 122/78 most recently, no meds; large discrepancy with wts. 10 kg below EDW,  Will follow with HD but UF limited due to low BPs (EDW 78).  5.Anemia - Hgb 9.4 on outpatient Epogen 13,000 U; last T-sat 12% with ferritin 1578.  6. Metabolic bone disease - Ca 9.7 (11.3 corrected), P 6.9, iPTH 94; 2Ca bath, Hectorol 1 mcg, Sensipar stopped, Fosrenol 500 mg with meals. DC Hectorol.  7.Nutrition - Alb 1.9 renal diet, multivitamin. 8. Disposition- poor prognosis and may benefit from palliative care consult to help set goals/limits of care.   Jetty Duhamel, NP [facility]  [phone] 05/14/2013,10:29 AM  LOS: 3 days    Additional Objective Labs: Basic Metabolic Panel:  Recent Labs Lab 05/11/13 1547 05/12/13 0715 05/13/13 0725  NA 133* 139 136  K 5.7* 3.7 4.1  CL 94* 108 98  CO2 25 19 26   GLUCOSE 126* 75 78  BUN 68* 25* 45*  CREATININE 5.83* 2.58* 4.69*  CALCIUM 9.9 7.0* 9.7  PHOS 6.3*  --  6.9*   Liver Function Tests:  Recent Labs Lab 05/11/13 1150 05/11/13 1547 05/13/13 0725  AST 30  --   --   ALT 19  --   --   ALKPHOS 83  --   --   BILITOT 0.4  --   --   PROT 9.6*  --   --   ALBUMIN 2.5* 2.2* 1.9*   No results found for this basename: LIPASE, AMYLASE,  in the last 168 hours CBC:  Recent Labs Lab 05/11/13 0045 05/11/13 1150 05/12/13 1115 05/13/13 0726  WBC 13.3* 16.4* 12.5* 12.1*  NEUTROABS  --  14.9*  --   --   HGB 11.0* 11.9* 11.0* 9.4*  HCT 33.3* 36.6* 33.8* 28.7*  MCV 77.1* 76.9* 77.7* 75.9*  PLT 442* 485* 377 379   Blood Culture    Component Value Date/Time   SDES URINE, RANDOM 05/12/2013 0413   SPECREQUEST NONE 05/12/2013 0413   CULT  Value: GRAM NEGATIVE RODS Performed at [facility] 05/12/2013 0413  REPTSTATUS PENDING 05/12/2013 0413    Cardiac Enzymes: No results found for this basename: CKTOTAL, CKMB, CKMBINDEX, TROPONINI,  in the last 168 hours CBG:  Recent Labs Lab 05/11/13 2156 05/12/13 0012 05/12/13 0348 05/12/13 0859 05/12/13 1207  GLUCAP 125* 117* 112* 105* 140*   Iron Studies: No results found for this basename: IRON, TIBC, TRANSFERRIN, FERRITIN,  in the last 72 hours @lablastinr3 @ Studies/Results: Dg Chest Port 1 View  05/13/2013   CLINICAL DATA:  Unexplained hypoxia  EXAM: PORTABLE CHEST - 1 VIEW  COMPARISON:  05/11/2013  FINDINGS: Cardiac enlargement without heart failure. Mild left lower lobe. Mild bibasilar atelectasis unchanged. Negative for effusion. Surgical clips in the thyroid bed.  IMPRESSION: Cardiac enlargement with bibasilar atelectasis, unchanged from the prior study.    Electronically Signed   By: Marlan Palau M.D.   On: 05/13/2013 13:26   Medications:   . dipyridamole-aspirin  1 capsule Oral BID  . docusate sodium  100 mg Oral BID  . feeding supplement  30 mL Oral TID WC  . gabapentin  100 mg Oral Daily  . heparin  5,000 Units Subcutaneous Q8H  . lanthanum  500 mg Oral TID WC  . mirtazapine  7.5 mg Oral QHS  . multivitamin  1 tablet Oral QHS  . piperacillin-tazobactam (ZOSYN)  IV  2.25 g Intravenous Q8H  . saccharomyces boulardii  250 mg Oral BID  . [START ON 05/16/2013] vancomycin  750 mg Intravenous Q M,W,F-HD      I have seen and examined this patient and agree with plan as outlined by Jetty Duhamel, NP.  Cont with IHD and wound care. Nishka Heide A,MD 05/14/2013 11:47 AM

## 2013-05-14 NOTE — Progress Notes (Signed)
[facility] Progress Note   [name] ZOX:[id number] DOB: [birth year] DOA: 05/11/2013 PCP: Crawford Givens, MD  HPI/Subjective: No significant complaints. Confused.  Brief narrative: 79 year old male patient with known history of chronic kidney disease on dialysis and a stage IV decubitus ulcer, bed bound. Also history of C. difficile in the past 12 months. He presented with fever altered mental status. Apparently the fevers have been ongoing for at least 48 hours. His temperature was 102. This patient does have a prior history of MRSA bacteremia had recently completed antibiotics for this and presumed osteomyelitis. Had been evaluated by the infectious disease service at last admission. At the skilled nursing facility he was apparently diagnosed with urinary tract infection on 9/30 and had been started on Augmentin.  Assessment/Plan: Active Problems:  Sepsis- likely from UTI -Presumed urinary source, repeat x-ray still negative for pneumonia. - also, must keep in mind that he previously had infected decubitus ulcer  -Continue current antibiotics, urine culture growing GNR, final result is pending  UTIs/Fever -as above  Dehydration -Continue gentle IVF -  Acute respiratory failure with hypoxia -? due to hypoventilation from AMS/sepsis -wean O2 as tolerated  Diabetes mellitus with end stage renal disease -well controlled -was not on meds pre admit  HYPERTENSION -BP soft at this time -was not on meds pre admit  CKD (chronic kidney disease) stage V requiring chronic dialysis -HD per nephrology  Anemia in chronic kidney disease -hgb stable    Decubitus ulcer of sacral region, stage 4/Decubitus ulcer of right heel, unstageable  -chronic and required surgical debridement last admission (June 2014) -suspected had underlying osteomyelitis so given prolonged anbx's IV as recommended by ID  -WOC RN eval       DVT prophylaxis: Subcutaneous heparin Code Status: DO  NOT RESUSCITATE Family Communication: No family at bedside Disposition Plan/Expected LOS: Transfer to floor Isolation: History of MRSA so contact isolation initiated although MRSA PCR swab was negative Nutritional Status: Acute and chronic protein calorie malnutrition as evidenced by suspected weight loss. Visual inspection of patient  clearly appears to be at a lower weight than the picture that is listed on his intake information from the nursing facility  Consultants: Nephrology  Procedures: None  Antibiotics: Zosyn 10/1 >>> Vancomycin 10/1 >>>   Objective: Blood pressure 122/78, pulse 94, temperature 97.9 F (36.6 C), temperature source Oral, resp. rate 22, height 6\' 4"  (1.93 m), weight 67.9 kg (149 lb 11.1 oz), SpO2 90.00%.  Intake/Output Summary (Last 24 hours) at 05/14/13 1355 Last data filed at 05/14/13 0900  Gross per 24 hour  Intake   1820 ml  Output      0 ml  Net   1820 ml     Exam: General: Appear in no acute distress Lungs: Clear to auscultation bilaterally  Cardiovascular: Regular rate and rhythm, S1S2 normal Abdomen: Nontender, nondistended, soft, bowel sounds positive Musculoskeletal: No significant cyanosis, clubbing of bilateral lower extremities Neurological: Alert and oriented x name only  Scheduled Meds:  Scheduled Meds: . dipyridamole-aspirin  1 capsule Oral BID  . docusate sodium  100 mg Oral BID  . feeding supplement  30 mL Oral TID WC  . gabapentin  100 mg Oral Daily  . heparin  5,000 Units Subcutaneous Q8H  . lanthanum  500 mg Oral TID WC  . mirtazapine  7.5 mg Oral QHS  . multivitamin  1 tablet Oral QHS  . piperacillin-tazobactam (ZOSYN)  IV  2.25 g Intravenous Q8H  . saccharomyces boulardii  250 mg Oral  BID  . [START ON 05/16/2013] vancomycin  750 mg Intravenous Q M,W,F-HD     Data Reviewed: Basic Metabolic Panel:  Recent Labs Lab 05/11/13 0045 05/11/13 1150 05/11/13 1547 05/12/13 0715 05/13/13 0725  NA  --  131* 133* 139 136   K  --  5.5* 5.7* 3.7 4.1  CL  --  89* 94* 108 98  CO2  --  25 25 19 26   GLUCOSE  --  133* 126* 75 78  BUN  --  69* 68* 25* 45*  CREATININE 3.11* 5.54* 5.83* 2.58* 4.69*  CALCIUM  --  10.0 9.9 7.0* 9.7  PHOS  --   --  6.3*  --  6.9*   Liver Function Tests:  Recent Labs Lab 05/11/13 1150 05/11/13 1547 05/13/13 0725  AST 30  --   --   ALT 19  --   --   ALKPHOS 83  --   --   BILITOT 0.4  --   --   PROT 9.6*  --   --   ALBUMIN 2.5* 2.2* 1.9*   No results found for this basename: LIPASE, AMYLASE,  in the last 168 hours No results found for this basename: AMMONIA,  in the last 168 hours CBC:  Recent Labs Lab 05/11/13 0045 05/11/13 1150 05/12/13 1115 05/13/13 0726  WBC 13.3* 16.4* 12.5* 12.1*  NEUTROABS  --  14.9*  --   --   HGB 11.0* 11.9* 11.0* 9.4*  HCT 33.3* 36.6* 33.8* 28.7*  MCV 77.1* 76.9* 77.7* 75.9*  PLT 442* 485* 377 379   Cardiac Enzymes: No results found for this basename: CKTOTAL, CKMB, CKMBINDEX, TROPONINI,  in the last 168 hours BNP (last 3 results) No results found for this basename: PROBNP,  in the last 8760 hours CBG:  Recent Labs Lab 05/11/13 2156 05/12/13 0012 05/12/13 0348 05/12/13 0859 05/12/13 1207  GLUCAP 125* 117* 112* 105* 140*    Recent Results (from the past 240 hour(s))  CULTURE, BLOOD (ROUTINE X 2)     Status: None   Collection Time    05/11/13 12:10 PM      Result Value Range Status   Specimen Description BLOOD LEFT ANTECUBITAL   Final   Special Requests BOTTLES DRAWN AEROBIC AND ANAEROBIC 10CC   Final   Culture  Setup Time     Final   Value: 05/11/2013 16:38     Performed at [facility]   Culture     Final   Value:        BLOOD CULTURE RECEIVED NO GROWTH TO DATE CULTURE WILL BE HELD FOR 5 DAYS BEFORE ISSUING A FINAL NEGATIVE REPORT     Performed at [facility]   Report Status PENDING   Incomplete  CULTURE, BLOOD (ROUTINE X 2)     Status: None   Collection Time    05/11/13 12:20 PM      Result Value  Range Status   Specimen Description BLOOD LEFT ANTECUBITAL   Final   Special Requests BOTTLES DRAWN AEROBIC AND ANAEROBIC 10CC   Final   Culture  Setup Time     Final   Value: 05/11/2013 16:38     Performed at [facility]   Culture     Final   Value:        BLOOD CULTURE RECEIVED NO GROWTH TO DATE CULTURE WILL BE HELD FOR 5 DAYS BEFORE ISSUING A FINAL NEGATIVE REPORT     Performed at [facility]  Report Status PENDING   Incomplete  MRSA PCR SCREENING     Status: None   Collection Time    05/11/13  9:48 PM      Result Value Range Status   MRSA by PCR NEGATIVE  NEGATIVE Final   Comment:            The GeneXpert MRSA Assay (FDA     approved for NASAL specimens     only), is one component of a     comprehensive MRSA colonization     surveillance program. It is not     intended to diagnose MRSA     infection nor to guide or     monitor treatment for     MRSA infections.  URINE CULTURE     Status: None   Collection Time    05/12/13  4:13 AM      Result Value Range Status   Specimen Description URINE, RANDOM   Final   Special Requests NONE   Final   Culture  Setup Time     Final   Value: 05/12/2013 09:14     Performed at [facility]   Colony Count     Final   Value: 30,000 COLONIES/ML     Performed at [facility]   Culture     Final   Value: GRAM NEGATIVE RODS     Performed at [facility]   Report Status PENDING   Incomplete     Studies:  Recent x-ray studies have been reviewed in detail by the Attending Physician    [facility] S,MD [phone] 05/14/2013, 1:55 PM

## 2013-05-15 LAB — CBC
HCT: 29.7 % — ABNORMAL LOW (ref 39.0–52.0)
MCH: 24.9 pg — ABNORMAL LOW (ref 26.0–34.0)
MCHC: 32.3 g/dL (ref 30.0–36.0)
MCV: 76.9 fL — ABNORMAL LOW (ref 78.0–100.0)
Platelets: 359 10*3/uL (ref 150–400)
RBC: 3.86 MIL/uL — ABNORMAL LOW (ref 4.22–5.81)
WBC: 13.3 10*3/uL — ABNORMAL HIGH (ref 4.0–10.5)

## 2013-05-15 LAB — BASIC METABOLIC PANEL
BUN: 41 mg/dL — ABNORMAL HIGH (ref 6–23)
CO2: 28 mEq/L (ref 19–32)
Calcium: 9.3 mg/dL (ref 8.4–10.5)
Chloride: 95 mEq/L — ABNORMAL LOW (ref 96–112)
Creatinine, Ser: 4.01 mg/dL — ABNORMAL HIGH (ref 0.50–1.35)

## 2013-05-15 MED ORDER — POTASSIUM CHLORIDE CRYS ER 20 MEQ PO TBCR
40.0000 meq | EXTENDED_RELEASE_TABLET | ORAL | Status: AC
  Administered 2013-05-15 (×2): 40 meq via ORAL
  Filled 2013-05-15 (×2): qty 2

## 2013-05-15 NOTE — Progress Notes (Signed)
[facility] Progress Note   Jovonni Borquez HQI:[id number] DOB: [birth year] DOA: 05/11/2013 PCP: Crawford Givens, MD  HPI/Subjective: No significant complaints. Conversant, denies any complaints at this time.  Brief narrative: 79 year old male patient with known history of chronic kidney disease on dialysis and a stage IV decubitus ulcer, bed bound. Also history of C. difficile in the past 12 months. He presented with fever altered mental status. Apparently the fevers have been ongoing for at least 48 hours. His temperature was 102. This patient does have a prior history of MRSA bacteremia had recently completed antibiotics for this and presumed osteomyelitis. Had been evaluated by the infectious disease service at last admission. At the skilled nursing facility he was apparently diagnosed with urinary tract infection on 9/30 and had been started on Augmentin.  Assessment/Plan: Active Problems:  Sepsis- likely from UTI -Presumed urinary source, repeat x-ray still negative for pneumonia. -Continue current antibiotics, urine culture growing GNR, final result is pending - Continue with zosyn till final results are available, will d/c the vancomycin.today is Day#5  UTIs/Fever -as above  Dehydration -Continue gentle IVF -  Acute respiratory failure with hypoxia -? due to hypoventilation from AMS/sepsis -wean O2 as tolerated  Diabetes mellitus with end stage renal disease -well controlled -was not on meds pre admit  Hypokalemia Replace potassium  HYPERTENSION -BP soft at this time -was not on meds pre admit  CKD (chronic kidney disease) stage V requiring chronic dialysis -HD per nephrology  Anemia in chronic kidney disease -hgb stable    Decubitus ulcer of sacral region, stage 4/Decubitus ulcer of right heel, unstageable  -chronic and required surgical debridement last admission (June 2014) -suspected had underlying osteomyelitis so given prolonged anbx's IV as  recommended by ID  -WOC RN eval    DVT prophylaxis: Subcutaneous heparin Code Status: DO NOT RESUSCITATE Family Communication: No family at bedside Disposition Plan/Expected LOS: Transfer to floor Isolation: History of MRSA so contact isolation initiated although MRSA PCR swab was negative Nutritional Status: Acute and chronic protein calorie malnutrition as evidenced by suspected weight loss. Visual inspection of patient  clearly appears to be at a lower weight than the picture that is listed on his intake information from the nursing facility  Consultants: Nephrology  Procedures: None  Antibiotics: Zosyn 10/1 >>> Vancomycin 10/1 >>>   Objective: Blood pressure 104/65, pulse 101, temperature 97.6 F (36.4 C), temperature source Oral, resp. rate 20, height 6\' 4"  (1.93 m), weight 67.9 kg (149 lb 11.1 oz), SpO2 97.00%.  Intake/Output Summary (Last 24 hours) at 05/15/13 1540 Last data filed at 05/15/13 0600  Gross per 24 hour  Intake    150 ml  Output    450 ml  Net   -300 ml     Exam: General: Appear in no acute distress Lungs: Clear to auscultation bilaterally  Cardiovascular: Regular rate and rhythm, S1S2 normal Abdomen: Nontender, nondistended, soft, bowel sounds positive Musculoskeletal: No significant cyanosis, clubbing of bilateral lower extremities Neurological: Alert and oriented x name only  Scheduled Meds:  Scheduled Meds: . dipyridamole-aspirin  1 capsule Oral BID  . docusate sodium  100 mg Oral BID  . feeding supplement  30 mL Oral TID WC  . gabapentin  100 mg Oral Daily  . heparin  5,000 Units Subcutaneous Q8H  . lanthanum  500 mg Oral TID WC  . mirtazapine  7.5 mg Oral QHS  . multivitamin  1 tablet Oral QHS  . piperacillin-tazobactam (ZOSYN)  IV  2.25 g Intravenous Q8H  .  saccharomyces boulardii  250 mg Oral BID  . [START ON 05/16/2013] vancomycin  750 mg Intravenous Q M,W,F-HD     Data Reviewed: Basic Metabolic Panel:  Recent Labs Lab  05/11/13 1150 05/11/13 1547 05/12/13 0715 05/13/13 0725 05/15/13 0445  NA 131* 133* 139 136 134*  K 5.5* 5.7* 3.7 4.1 3.1*  CL 89* 94* 108 98 95*  CO2 25 25 19 26 28   GLUCOSE 133* 126* 75 78 93  BUN 69* 68* 25* 45* 41*  CREATININE 5.54* 5.83* 2.58* 4.69* 4.01*  CALCIUM 10.0 9.9 7.0* 9.7 9.3  PHOS  --  6.3*  --  6.9*  --    Liver Function Tests:  Recent Labs Lab 05/11/13 1150 05/11/13 1547 05/13/13 0725  AST 30  --   --   ALT 19  --   --   ALKPHOS 83  --   --   BILITOT 0.4  --   --   PROT 9.6*  --   --   ALBUMIN 2.5* 2.2* 1.9*   No results found for this basename: LIPASE, AMYLASE,  in the last 168 hours No results found for this basename: AMMONIA,  in the last 168 hours CBC:  Recent Labs Lab 05/11/13 0045 05/11/13 1150 05/12/13 1115 05/13/13 0726 05/15/13 0445  WBC 13.3* 16.4* 12.5* 12.1* 13.3*  NEUTROABS  --  14.9*  --   --   --   HGB 11.0* 11.9* 11.0* 9.4* 9.6*  HCT 33.3* 36.6* 33.8* 28.7* 29.7*  MCV 77.1* 76.9* 77.7* 75.9* 76.9*  PLT 442* 485* 377 379 359   Cardiac Enzymes: No results found for this basename: CKTOTAL, CKMB, CKMBINDEX, TROPONINI,  in the last 168 hours BNP (last 3 results) No results found for this basename: PROBNP,  in the last 8760 hours CBG:  Recent Labs Lab 05/11/13 2156 05/12/13 0012 05/12/13 0348 05/12/13 0859 05/12/13 1207  GLUCAP 125* 117* 112* 105* 140*    Recent Results (from the past 240 hour(s))  CULTURE, BLOOD (ROUTINE X 2)     Status: None   Collection Time    05/11/13 12:10 PM      Result Value Range Status   Specimen Description BLOOD LEFT ANTECUBITAL   Final   Special Requests BOTTLES DRAWN AEROBIC AND ANAEROBIC 10CC   Final   Culture  Setup Time     Final   Value: 05/11/2013 16:38     Performed at [facility]   Culture     Final   Value:        BLOOD CULTURE RECEIVED NO GROWTH TO DATE CULTURE WILL BE HELD FOR 5 DAYS BEFORE ISSUING A FINAL NEGATIVE REPORT     Performed at [facility]    Report Status PENDING   Incomplete  CULTURE, BLOOD (ROUTINE X 2)     Status: None   Collection Time    05/11/13 12:20 PM      Result Value Range Status   Specimen Description BLOOD LEFT ANTECUBITAL   Final   Special Requests BOTTLES DRAWN AEROBIC AND ANAEROBIC 10CC   Final   Culture  Setup Time     Final   Value: 05/11/2013 16:38     Performed at [facility]   Culture     Final   Value:        BLOOD CULTURE RECEIVED NO GROWTH TO DATE CULTURE WILL BE HELD FOR 5 DAYS BEFORE ISSUING A FINAL NEGATIVE REPORT     Performed at [facility]   Report Status PENDING   Incomplete  MRSA PCR SCREENING     Status: None   Collection Time    05/11/13  9:48 PM      Result Value Range Status   MRSA by PCR NEGATIVE  NEGATIVE Final   Comment:            The GeneXpert MRSA Assay (FDA     approved for NASAL specimens     only), is one component of a     comprehensive MRSA colonization     surveillance program. It is not     intended to diagnose MRSA     infection nor to guide or     monitor treatment for     MRSA infections.  URINE CULTURE     Status: None   Collection Time    05/12/13  4:13 AM      Result Value Range Status   Specimen Description URINE, RANDOM   Final   Special Requests NONE   Final   Culture  Setup Time     Final   Value: 05/12/2013 09:14     Performed at [facility]   Colony Count     Final   Value: 30,000 COLONIES/ML     Performed at [facility]   Culture     Final   Value: GRAM NEGATIVE RODS     Performed at [facility]   Report Status PENDING   Incomplete     Studies:  Recent x-ray studies have been reviewed in detail by the Attending Physician    [facility] S,MD [phone] 05/15/2013, 3:40 PM

## 2013-05-15 NOTE — Progress Notes (Signed)
I have seen and examined this patient and agree with plan as outlined by Jetty Duhamel, NP although would recommend stopping Vanco and narrowing gram negative coverage.  Cont with HD qMWF for now. Johnte Portnoy A,MD 05/15/2013 11:53 AM

## 2013-05-15 NOTE — Progress Notes (Signed)
Subjective:   Confused, no complaints. Says feels ok  Objective Filed Vitals:   05/14/13 0624 05/14/13 1708 05/14/13 2112 05/15/13 0500  BP: 122/78 113/65 106/71 97/60  Pulse: 94 98 108 101  Temp: 97.9 F (36.6 C) 97.6 F (36.4 C) 97.8 F (36.6 C) 97.8 F (36.6 C)  TempSrc: Oral Oral Oral Oral  Resp: 22 20 20 20   Height:      Weight:    67.9 kg (149 lb 11.1 oz)  SpO2: 90% 100% 100% 100%   Physical Exam General: No acute distress, oriented to self, easily arousable Heart: RRR, no murmur Lungs: CTA, unlabord Abdomen: soft nontender, +BS Extremities: No edema. Multiple areas of skin breakdown- Sacrum stage 4, R hip stage 2, R calf unstageable, Bilat heel unstageable  Dialysis Access:  LFA AVF +bruit/thrill   Dialysis Orders: [facility]WF [facility]  3:45     78 kg      2K/2Ca     500/A1.5      Heparin 5000 U      AVF @ LFA  Hectorol 1 mcg     Epogen 13,000 U      Venofer 0     Assessment/Plan:  1. Fever/leukocytosis - likely urosepsis although blood cultures negative (pt was started on augmentin PTA at SNF on 9/30). Urine culture - Gram negative rods. Afebrile, WBC's improving, 13.3 now on Vancomycin & Zosyn since 10/1; BCs no growth to date. Chest xray 10.3- bibasilar atelectatsis, unchanged from 10/1  2. Stage 4 sacral decubitus/bilateral LE pressure ulcers - necrotic wounds on lower extremities, wound care consulted.  3. ESRD - HD on MWF @ [place]; EDW 78kg K+3.1  4. Hypertension/volume - BP 97/60 most recently, no meds; large discrepancy with wts. 10 kg below EDW, Will follow with HD but UF limited due to low BPs (EDW 78).  5.Anemia - Hgb 9.6 on outpatient Epogen 13,000 U; last T-sat 12% with ferritin 1578.  6. Metabolic bone disease - Ca 9.3 (11.3 corrected), P 6.9, iPTH 94; 2Ca bath, Hectorol 1 mcg, Sensipar stopped, Fosrenol 500 mg with meals. DC Hectorol.  7.Nutrition - Alb 1.9 renal diet, multivitamin.  8. Disposition- poor prognosis and may benefit from palliative care consult to  help set goals/limits of care.      Jetty Duhamel, NP [facility] Beeper [phone] 05/15/2013,9:57 AM  LOS: 4 days    Additional Objective Labs: Basic Metabolic Panel:  Recent Labs Lab 05/11/13 1547 05/12/13 0715 05/13/13 0725 05/15/13 0445  NA 133* 139 136 134*  K 5.7* 3.7 4.1 3.1*  CL 94* 108 98 95*  CO2 25 19 26 28   GLUCOSE 126* 75 78 93  BUN 68* 25* 45* 41*  CREATININE 5.83* 2.58* 4.69* 4.01*  CALCIUM 9.9 7.0* 9.7 9.3  PHOS 6.3*  --  6.9*  --    Liver Function Tests:  Recent Labs Lab 05/11/13 1150 05/11/13 1547 05/13/13 0725  AST 30  --   --   ALT 19  --   --   ALKPHOS 83  --   --   BILITOT 0.4  --   --   PROT 9.6*  --   --   ALBUMIN 2.5* 2.2* 1.9*   No results found for this basename: LIPASE, AMYLASE,  in the last 168 hours CBC:  Recent Labs Lab 05/11/13 0045 05/11/13 1150 05/12/13 1115 05/13/13 0726 05/15/13 0445  WBC 13.3* 16.4* 12.5* 12.1* 13.3*  NEUTROABS  --  14.9*  --   --   --  HGB 11.0* 11.9* 11.0* 9.4* 9.6*  HCT 33.3* 36.6* 33.8* 28.7* 29.7*  MCV 77.1* 76.9* 77.7* 75.9* 76.9*  PLT 442* 485* 377 379 359   Blood Culture    Component Value Date/Time   SDES URINE, RANDOM 05/12/2013 0413   SPECREQUEST NONE 05/12/2013 0413   CULT  Value: GRAM NEGATIVE RODS Performed at [facility] 05/12/2013 0413   REPTSTATUS PENDING 05/12/2013 0413    Cardiac Enzymes: No results found for this basename: CKTOTAL, CKMB, CKMBINDEX, TROPONINI,  in the last 168 hours CBG:  Recent Labs Lab 05/11/13 2156 05/12/13 0012 05/12/13 0348 05/12/13 0859 05/12/13 1207  GLUCAP 125* 117* 112* 105* 140*   Iron Studies: No results found for this basename: IRON, TIBC, TRANSFERRIN, FERRITIN,  in the last 72 hours @lablastinr3 @ Studies/Results: Dg Chest Port 1 View  05/13/2013   CLINICAL DATA:  Unexplained hypoxia  EXAM: PORTABLE CHEST - 1 VIEW  COMPARISON:  05/11/2013  FINDINGS: Cardiac enlargement without heart failure. Mild left lower  lobe. Mild bibasilar atelectasis unchanged. Negative for effusion. Surgical clips in the thyroid bed.  IMPRESSION: Cardiac enlargement with bibasilar atelectasis, unchanged from the prior study.   Electronically Signed   By: Marlan Palau M.D.   On: 05/13/2013 13:26   Medications:   . dipyridamole-aspirin  1 capsule Oral BID  . docusate sodium  100 mg Oral BID  . feeding supplement  30 mL Oral TID WC  . gabapentin  100 mg Oral Daily  . heparin  5,000 Units Subcutaneous Q8H  . lanthanum  500 mg Oral TID WC  . mirtazapine  7.5 mg Oral QHS  . multivitamin  1 tablet Oral QHS  . piperacillin-tazobactam (ZOSYN)  IV  2.25 g Intravenous Q8H  . saccharomyces boulardii  250 mg Oral BID  . [START ON 05/16/2013] vancomycin  750 mg Intravenous Q M,W,F-HD

## 2013-05-16 DIAGNOSIS — E1149 Type 2 diabetes mellitus with other diabetic neurological complication: Secondary | ICD-10-CM

## 2013-05-16 DIAGNOSIS — R4182 Altered mental status, unspecified: Secondary | ICD-10-CM | POA: Diagnosis present

## 2013-05-16 LAB — CBC
HCT: 30.1 % — ABNORMAL LOW (ref 39.0–52.0)
Hemoglobin: 9.8 g/dL — ABNORMAL LOW (ref 13.0–17.0)
MCHC: 32.6 g/dL (ref 30.0–36.0)
RBC: 3.94 MIL/uL — ABNORMAL LOW (ref 4.22–5.81)
RDW: 17.6 % — ABNORMAL HIGH (ref 11.5–15.5)
WBC: 15.8 10*3/uL — ABNORMAL HIGH (ref 4.0–10.5)

## 2013-05-16 LAB — RENAL FUNCTION PANEL
Albumin: 1.7 g/dL — ABNORMAL LOW (ref 3.5–5.2)
BUN: 56 mg/dL — ABNORMAL HIGH (ref 6–23)
CO2: 22 mEq/L (ref 19–32)
Chloride: 95 mEq/L — ABNORMAL LOW (ref 96–112)
GFR calc Af Amer: 11 mL/min — ABNORMAL LOW (ref >90)
Glucose, Bld: 139 mg/dL — ABNORMAL HIGH (ref 70–99)
Phosphorus: 3.6 mg/dL (ref 2.3–4.6)
Potassium: 4.2 mEq/L (ref 3.5–5.1)

## 2013-05-16 LAB — BASIC METABOLIC PANEL
BUN: 52 mg/dL — ABNORMAL HIGH (ref 6–23)
CO2: 24 mEq/L (ref 19–32)
Calcium: 9.4 mg/dL (ref 8.4–10.5)
Creatinine, Ser: 4.99 mg/dL — ABNORMAL HIGH (ref 0.50–1.35)
GFR calc non Af Amer: 10 mL/min — ABNORMAL LOW (ref >90)
Glucose, Bld: 82 mg/dL (ref 70–99)
Sodium: 132 mEq/L — ABNORMAL LOW (ref 135–145)

## 2013-05-16 LAB — URINE CULTURE

## 2013-05-16 MED ORDER — PENTAFLUOROPROP-TETRAFLUOROETH EX AERO: 1.0000 "application " | INHALATION_SPRAY | CUTANEOUS | Status: DC | PRN

## 2013-05-16 MED ORDER — NEPRO/CARBSTEADY PO LIQD: 237.0000 mL | ORAL | Status: AC | PRN

## 2013-05-16 MED ORDER — DARBEPOETIN ALFA-POLYSORBATE 150 MCG/0.3ML IJ SOLN
150.0000 ug | INTRAMUSCULAR | Status: DC
  Administered 2013-05-16: 13:00:00 150 ug via INTRAVENOUS
  Filled 2013-05-16: qty 0.3

## 2013-05-16 MED ORDER — ALTEPLASE 2 MG IJ SOLR
2.0000 mg | Freq: Once | INTRAMUSCULAR | Status: DC | PRN
  Filled 2013-05-16: qty 2

## 2013-05-16 MED ORDER — SODIUM CHLORIDE 0.9 % IV SOLN: 100.0000 mL | INTRAVENOUS | Status: DC | PRN

## 2013-05-16 MED ORDER — CEFUROXIME AXETIL 500 MG PO TABS: 500.0000 mg | ORAL_TABLET | Freq: Two times a day (BID) | ORAL | Status: AC

## 2013-05-16 MED ORDER — MIRTAZAPINE 7.5 MG PO TABS: 7.5000 mg | ORAL_TABLET | Freq: Every day | ORAL | Status: AC

## 2013-05-16 MED ORDER — OXYCODONE HCL 5 MG PO TABS: 5.0000 mg | ORAL_TABLET | Freq: Four times a day (QID) | ORAL | Status: AC | PRN

## 2013-05-16 MED ORDER — HEPARIN SODIUM (PORCINE) 1000 UNIT/ML DIALYSIS: 5000.0000 [IU] | Freq: Once | INTRAMUSCULAR | Status: DC

## 2013-05-16 MED ORDER — LIDOCAINE HCL (PF) 1 % IJ SOLN: 5.0000 mL | INTRAMUSCULAR | Status: DC | PRN

## 2013-05-16 MED ORDER — LIDOCAINE-PRILOCAINE 2.5-2.5 % EX CREA: 1.0000 "application " | TOPICAL_CREAM | CUTANEOUS | Status: DC | PRN

## 2013-05-16 MED ORDER — HEPARIN SODIUM (PORCINE) 1000 UNIT/ML DIALYSIS: 1000.0000 [IU] | INTRAMUSCULAR | Status: DC | PRN

## 2013-05-16 MED ORDER — DARBEPOETIN ALFA-POLYSORBATE 150 MCG/0.3ML IJ SOLN
INTRAMUSCULAR | Status: AC
  Filled 2013-05-16: qty 0.3

## 2013-05-16 MED ORDER — NEPRO/CARBSTEADY PO LIQD: 237.0000 mL | ORAL | Status: DC | PRN

## 2013-05-16 NOTE — Clinical Social Work Note (Signed)
Patient's wife informed that patient will DC back to [facility] this afternoon.   Roddie Mc, [place], [place], [phone]

## 2013-05-16 NOTE — Progress Notes (Signed)
Subjective:  Pleasant, smiling, no complaints  Objective: Vital signs in last 24 hours: Temp:  [97.6 F (36.4 C)-99.1 F (37.3 C)] 99.1 F (37.3 C) (10/06 0529) Pulse Rate:  [99-108] 99 (10/06 0529) Resp:  [20] 20 (10/06 0529) BP: (104-106)/(58-69) 104/58 mmHg (10/06 0529) SpO2:  [97 %-100 %] 99 % (10/06 0529) Weight:  [67.901 kg (149 lb 11.1 oz)] 67.901 kg (149 lb 11.1 oz) (10/05 2013) Weight change: 0.001 kg (0 oz)  Intake/Output from previous day: 10/05 0701 - 10/06 0700 In: 370 [P.O.:220; IV Piggyback:150] Out: 250 [Urine:250] Intake/Output this shift: Total I/O In: 270 [P.O.:220; IV Piggyback:50] Out: 250 [Urine:250]  Lab Results:  Recent Labs  05/13/13 0726 05/15/13 0445  WBC 12.1* 13.3*  HGB 9.4* 9.6*  HCT 28.7* 29.7*  PLT 379 359   BMET:  Recent Labs  05/13/13 0725 05/15/13 0445  NA 136 134*  K 4.1 3.1*  CL 98 95*  CO2 26 28  GLUCOSE 78 93  BUN 45* 41*  CREATININE 4.69* 4.01*  CALCIUM 9.7 9.3  ALBUMIN 1.9*  --    No results found for this basename: PTH,  in the last 72 hours Iron Studies: No results found for this basename: IRON, TIBC, TRANSFERRIN, FERRITIN,  in the last 72 hours  EXAM: General appearance:  Alert, in no apparent distress Resp:  CTA without rales, rhonchi, or wheezes Cardio:  RRR without murmur or rub GI:  + BS, soft and nontender Extremities:  No edema, multiple wounds with dressings (sacral ulcer Stage 4, B posterior lower legs and heels) Access:  AVF @ LFA with + bruit  Dialysis Orders: MWF @ [facility]  3:45 78 kg 2K/2Ca 500/A1.5 Heparin 5000 U AVF @ LFA  Hectorol 1 mcg Epogen 13,000 U Venofer 0  Assessment/Plan: 1. Fever/leukocytosis - likely urosepsis, T 99.1, WBCs down to 13.3; multiple pressure ulcers, including sacral decubitus, but believed to be urinary source with Foley catheter (BCs negative, but urine culture 10/2 with Gr negative rods), started Augmentin for UTI 9/30 per SNF, now s/p Vancomycin x 5 days, remains on  Zosyn. 2. Stage 4 sacral decubitus/bilateral LE pressure ulcers - necrotic wounds on lower extremities, wound care following. 3. ESRD - HD on MWF @ [place]; K 3.1 yesterday.  Next HD today. 4. Hypertension/volume - BP 104/58 most recently, no meds; wt 67.9 kg, low UF sec to low BPs (EDW 78). 5. Anemia - Hgb down to 9.6; last T-sat 12% with ferritin 1578.  Start Aranesp 150 mcg. 6. Metabolic bone disease - Ca 9.3 (10.8 corrected), P 6.9, iPTH 94; 2Ca bath, Hectorol 1 mcg on hold, Sensipar stopped, Fosrenol 500 mg with meals. 7. Nutrition - Alb down to 1.9, renal diet, multivitamin.     LOS: 5 days   [name],CHARLES 05/16/2013,6:37 AM  [name]  MD Pager [phone]    Cell  [phone] 05/16/2013, 1:26 PM

## 2013-05-16 NOTE — Procedures (Signed)
I was present at this dialysis session. I have reviewed the session itself and made appropriate changes.   [name]  MD Pager [phone]    Cell  [phone] 05/16/2013, 1:26 PM

## 2013-05-16 NOTE — Progress Notes (Signed)
[name] to be D/C'd Skilled nursing facility  per MD order.  Discussed with the patient and all questions fully answered.    Medication List    STOP taking these medications       amoxicillin-clavulanate 875-125 MG per tablet  Commonly known as:  AUGMENTIN     glucose blood test strip      TAKE these medications       acetaminophen 650 MG CR tablet  Commonly known as:  TYLENOL  Take 1,300 mg by mouth every 4 (four) hours as needed for pain or fever (not to exceed 4000g daily).     cefUROXime 500 MG tablet  Commonly known as:  CEFTIN  Take 1 tablet (500 mg total) by mouth 2 (two) times daily.     dipyridamole-aspirin 200-25 MG per 12 hr capsule  Commonly known as:  AGGRENOX  Take 1 capsule by mouth 2 (two) times daily.     feeding supplement (NEPRO CARB STEADY) Liqd  Take 237 mLs by mouth 2 (two) times daily between meals.     feeding supplement (NEPRO CARB STEADY) Liqd  Take 237 mLs by mouth as needed (missed meal during dialysis.).     feeding supplement Liqd  Take 30 mLs by mouth 3 (three) times daily with meals.     Fish Oil 1000 MG Caps  Take 1 capsule by mouth daily.     gabapentin 100 MG capsule  Commonly known as:  NEURONTIN  Take 100 mg by mouth daily.     lanthanum 500 MG chewable tablet  Commonly known as:  FOSRENOL  Chew 1 tablet (500 mg total) by mouth 3 (three) times daily with meals.     mirtazapine 7.5 MG tablet  Commonly known as:  REMERON  Take 1 tablet (7.5 mg total) by mouth at bedtime.     multivitamin Tabs tablet  Take 1 tablet by mouth daily.     oxyCODONE 5 MG immediate release tablet  Commonly known as:  Oxy IR/ROXICODONE  Take 1 tablet (5 mg total) by mouth every 6 (six) hours as needed for pain.     saccharomyces boulardii 250 MG capsule  Commonly known as:  FLORASTOR  Take 250 mg by mouth 2 (two) times daily.     SENSIPAR 30 MG tablet  Generic drug:  cinacalcet  Take 30 mg by mouth 2 (two) times daily.        VVS.  Stage 4 pressure ulcer on sacrum, stage 2 healed pressure ulcer on right hip, unstageable wounds on left heel, right foot, and right heel, and unstageable wounds on bilateral calves. Pt had dialysis today.   IV catheter discontinued intact. Site without signs and symptoms of complications. Dressing and pressure applied.  An After Visit Summary was printed and given to the patient. Patient escorted via stretcher, and D/C to [facility] via [place]. RN called report to Lupita Leash at [facility].  [name] 05/16/2013 4:39 PM

## 2013-05-16 NOTE — ED Provider Notes (Signed)
I saw and evaluated the patient, reviewed the resident's note and I agree with the findings and plan. Pt from ecf via ems, pt w esrd on hd, c/o shaking chills, fever 102, generalized weakness, ?acute mental status change.  Iv ns, o2, continuous monitor, urine and blood cultures, labs.  Pt markedly tachycardic.  Lactic acid elevated ?sepsis syndrome.  Given esrd and bp ok, given small boluses ns. Monitor pressure/vitals.  Iv abx.  Additional reassessments, pt alert, tachycardia mildy improved w rx.  Medical service consulted re admission. CRITICAL CARE marked tachycardia, fever, elevated lactic acid/sepsis syndrome.  Performed by: Suzi Roots Total critical care time: 35 Critical care time was exclusive of separately billable procedures and treating other patients. Critical care was necessary to treat or prevent imminent or life-threatening deterioration. Critical care was time spent personally by me on the following activities: development of treatment plan with patient and/or surrogate as well as nursing, discussions with consultants, evaluation of patient's response to treatment, examination of patient, obtaining history from patient or surrogate, ordering and performing treatments and interventions, ordering and review of laboratory studies, ordering and review of radiographic studies, pulse oximetry and re-evaluation of patient's condition.   Suzi Roots, MD 05/16/13 [phone]

## 2013-05-16 NOTE — Discharge Summary (Signed)
Physician Discharge Summary  [name] WUJ:[id number] DOB: 11-29-1930 DOA: 05/11/2013  PCP: Crawford Givens, MD  Admit date: 05/11/2013 Discharge date: 05/16/2013  Time spent: 60 minutes  Recommendations for Outpatient Follow-up:  1. Consider Palliative medicine consult to establish goals of care for patient and family. 2. Daily Wound care for sacral and leg wounds. 3. Ceftin x 7 days to complete 2 week antibiotic course.  SNF Physician to determine appropriateness of antibiotic therapy for wounds. 4. Hemodialysis per PCP/Nephrology 5. Cbc, bmet in 3-5 days    Discharge Diagnoses:  Principal Problem:   Altered mental status Active Problems:   Fever   Abnormal urinalysis   Diabetes mellitus with end stage renal disease   HYPERTENSION   CKD (chronic kidney disease) stage V requiring chronic dialysis   Anemia in chronic kidney disease   History of CVA (cerebrovascular accident)   Sepsis   Decubitus ulcer of sacral region, stage 4   Decubitus ulcer of right heel, unstageable   Protein-calorie malnutrition, severe   Dehydration   Acute respiratory failure with hypoxia   UTI (urinary tract infection)   Discharge Condition: Stable  Diet recommendation: Renal Diet  Filed Weights   05/15/13 0500 05/15/13 2013 05/16/13 1045  Weight: 67.9 kg (149 lb 11.1 oz) 67.901 kg (149 lb 11.1 oz) 69.3 kg (152 lb 12.5 oz)    History of present illness:  79 year old male patient with known history of chronic kidney disease on dialysis and a stage IV decubitus ulcer, bed bound. Also history of C. difficile in the past 12 months. He presented with fever altered mental status. Apparently the fevers have been ongoing for at least 48 hours. His temperature was 102. This patient does have a prior history of MRSA bacteremia had recently completed antibiotics for this and presumed osteomyelitis. Had been evaluated by the infectious disease service at last admission. At the skilled nursing facility he  was apparently diagnosed with urinary tract infection on 9/30 and had been started on Augmentin.   Hospital Course:   Sepsis- likely from UTI  -Presumed urinary source, repeat x-ray still negative for pneumonia.  -Vancomycin and Zosyn started empirically until urine culture showed Gram negative rods, Ralstonia Paucula Sensitive to Ceftin. -Vanc d/c on 10/5 after 5 day course of thearpy, Zosyn d/c on 10/6 -He will be d/c on 7 more days of Ceftin 500 mg PO BID.  UTIs/Fever  -As above   Dehydration  -Gentle IVF rehydration -Labs are WNL and stable at time of discharge  Acute respiratory failure with hypoxia  -? due to hypoventilation from AMS/sepsis  -O2 therapy prn -O2 Sat of 97% on room air day of discharge  Diabetes mellitus with end stage renal disease  -Well controlled  -Was not on meds pre admit   Hypokalemia  -Potassium repleted with po K+ supplementation   Hypertension  -BP chronically soft due to immobility and CKD. -Was not on meds pre admit  CKD (chronic kidney disease) stage V requiring chronic dialysis  -HD per Nephrology   Anemia in chronic kidney disease  -Hgb stable  -Started Aranesp per Nephrology  Decubitus ulcer of sacral region, stage 4/Decubitus ulcer of right heel, unstageable  -Chronic and required surgical debridement last admission (June 2014)  -Suspected had underlying osteomyelitis so given 6wks IV Vanc as recommended by ID. -Wound Care RN eval and treated daily -Started 7 day course of po Ceftin upon discharge.   Further antibiotic treatment to be evaluated by   Consultations:  Nephrology  Discharge  Exam: Filed Vitals:   05/16/13 1430  BP: 113/62  Pulse: 115  Temp:   Resp:     General: Awake, Alert to location and time, AMS with disassociative speech. NAD, resting comfortably in bed. pleasant Cardiovascular: RRR, Normal S1/S1, no M/G/R Respiratory: CTAB with no wheezes, rales, rhonchi Abdomen: Soft, nontender, BS  + Musculoskeletal: Stage IV Sacral Decubitus ulcer, multiple LE ulcers.  Dressings are clean and dry, wounds have no sign of infection.  Discharge Instructions      Discharge Orders   Future Orders Complete By Expires   Diet - low sodium heart healthy  As directed    Increase activity slowly  As directed        Medication List    STOP taking these medications       amoxicillin-clavulanate 875-125 MG per tablet  Commonly known as:  AUGMENTIN     glucose blood test strip      TAKE these medications       acetaminophen 650 MG CR tablet  Commonly known as:  TYLENOL  Take 1,300 mg by mouth every 4 (four) hours as needed for pain or fever (not to exceed 4000g daily).     cefUROXime 500 MG tablet  Commonly known as:  CEFTIN  Take 1 tablet (500 mg total) by mouth 2 (two) times daily.     dipyridamole-aspirin 200-25 MG per 12 hr capsule  Commonly known as:  AGGRENOX  Take 1 capsule by mouth 2 (two) times daily.     feeding supplement (NEPRO CARB STEADY) Liqd  Take 237 mLs by mouth 2 (two) times daily between meals.     feeding supplement Liqd  Take 30 mLs by mouth 3 (three) times daily with meals.     Fish Oil 1000 MG Caps  Take 1 capsule by mouth daily.     gabapentin 100 MG capsule  Commonly known as:  NEURONTIN  Take 100 mg by mouth daily.     lanthanum 500 MG chewable tablet  Commonly known as:  FOSRENOL  Chew 1 tablet (500 mg total) by mouth 3 (three) times daily with meals.     mirtazapine 7.5 MG tablet  Commonly known as:  REMERON  Take 7.5 mg by mouth at bedtime.     multivitamin Tabs tablet  Take 1 tablet by mouth daily.     oxyCODONE 5 MG immediate release tablet  Commonly known as:  Oxy IR/ROXICODONE  Take 1 tablet (5 mg total) by mouth every 6 (six) hours as needed for pain.     saccharomyces boulardii 250 MG capsule  Commonly known as:  FLORASTOR  Take 250 mg by mouth 2 (two) times daily.     SENSIPAR 30 MG tablet  Generic drug:  cinacalcet   Take 30 mg by mouth 2 (two) times daily.       No Known Allergies Follow-up Information   Follow up with Crawford Givens, MD.   Specialty:  [facility]   Contact information:   [address] [place] [address] [phone]       Follow up with Palliative Medicine Consultation. (would strongly recommend Palliative Medicine consultation for goals of care.)       Follow up with Wound Care. (Continue on-going wound care to sacrum and legs.)       The results of significant diagnostics from this hospitalization (including imaging, microbiology, ancillary and laboratory) are listed below for reference.    Significant Diagnostic Studies: Dg Chest Port 1  View  05/13/2013   CLINICAL DATA:  Unexplained hypoxia  EXAM: PORTABLE CHEST - 1 VIEW  COMPARISON:  05/11/2013  FINDINGS: Cardiac enlargement without heart failure. Mild left lower lobe. Mild bibasilar atelectasis unchanged. Negative for effusion. Surgical clips in the thyroid bed.  IMPRESSION: Cardiac enlargement with bibasilar atelectasis, unchanged from the prior study.   Electronically Signed   By: Marlan Palau M.D.   On: 05/13/2013 13:26   Dg Chest Port 1 View  05/11/2013   CLINICAL DATA:  Generalized body aches, hypertension, diabetes, end-stage renal disease on dialysis, initial encounter  EXAM: PORTABLE CHEST - 1 VIEW  COMPARISON:  Portable exam 1118 hr compared to 01/29/2013  FINDINGS: Slightly rotated to the left.  Enlargement of cardiac silhouette.  Atherosclerotic calcification aorta.  Mediastinal contours and pulmonary vascularity normal.  Mild right basilar atelectasis.  Lungs otherwise clear.  No pleural effusion or pneumothorax.  Scattered endplate spur formation thoracic spine.  Chronic right rotator cuff tear.  IMPRESSION: Minimal enlargement of cardiac silhouette.  Mild right basilar atelectasis.   Electronically Signed   By: Ulyses Southward M.D.   On: 05/11/2013 12:13    Microbiology: Recent Results (from the  past 240 hour(s))  CULTURE, BLOOD (ROUTINE X 2)     Status: None   Collection Time    05/11/13 12:10 PM      Result Value Range Status   Specimen Description BLOOD LEFT ANTECUBITAL   Final   Special Requests BOTTLES DRAWN AEROBIC AND ANAEROBIC 10CC   Final   Culture  Setup Time     Final   Value: 05/11/2013 16:38     Performed at [facility]   Culture     Final   Value:        BLOOD CULTURE RECEIVED NO GROWTH TO DATE CULTURE WILL BE HELD FOR 5 DAYS BEFORE ISSUING A FINAL NEGATIVE REPORT     Performed at [facility]   Report Status PENDING   Incomplete  CULTURE, BLOOD (ROUTINE X 2)     Status: None   Collection Time    05/11/13 12:20 PM      Result Value Range Status   Specimen Description BLOOD LEFT ANTECUBITAL   Final   Special Requests BOTTLES DRAWN AEROBIC AND ANAEROBIC 10CC   Final   Culture  Setup Time     Final   Value: 05/11/2013 16:38     Performed at [facility]   Culture     Final   Value:        BLOOD CULTURE RECEIVED NO GROWTH TO DATE CULTURE WILL BE HELD FOR 5 DAYS BEFORE ISSUING A FINAL NEGATIVE REPORT     Performed at [facility]   Report Status PENDING   Incomplete  MRSA PCR SCREENING     Status: None   Collection Time    05/11/13  9:48 PM      Result Value Range Status   MRSA by PCR NEGATIVE  NEGATIVE Final   Comment:            The GeneXpert MRSA Assay (FDA     approved for NASAL specimens     only), is one component of a     comprehensive MRSA colonization     surveillance program. It is not     intended to diagnose MRSA     infection nor to guide or     monitor treatment for     MRSA infections.  URINE CULTURE  Status: None   Collection Time    05/12/13  4:13 AM      Result Value Range Status   Specimen Description URINE, RANDOM   Final   Special Requests NONE   Final   Culture  Setup Time     Final   Value: 05/12/2013 09:14     Performed at [facility]   Colony Count     Final   Value: 30,000  COLONIES/ML     Performed at [facility]   Culture     Final   Value: Judieth Keens     Performed at [facility]   Report Status 05/16/2013 FINAL   Final     Labs: Basic Metabolic Panel:  Recent Labs Lab 05/11/13 1547 05/12/13 0715 05/13/13 0725 05/15/13 0445 05/16/13 0600 05/16/13 1125  NA 133* 139 136 134* 132* 132*  K 5.7* 3.7 4.1 3.1* 4.2 4.2  CL 94* 108 98 95* 96 95*  CO2 25 19 26 28 24 22   GLUCOSE 126* 75 78 93 82 139*  BUN 68* 25* 45* 41* 52* 56*  CREATININE 5.83* 2.58* 4.69* 4.01* 4.99* 5.12*  CALCIUM 9.9 7.0* 9.7 9.3 9.4 9.4  PHOS 6.3*  --  6.9*  --   --  3.6   Liver Function Tests:  Recent Labs Lab 05/11/13 1150 05/11/13 1547 05/13/13 0725 05/16/13 1125  AST 30  --   --   --   ALT 19  --   --   --   ALKPHOS 83  --   --   --   BILITOT 0.4  --   --   --   PROT 9.6*  --   --   --   ALBUMIN 2.5* 2.2* 1.9* 1.7*   CBC:  Recent Labs Lab 05/11/13 1150 05/12/13 1115 05/13/13 0726 05/15/13 0445 05/16/13 1125  WBC 16.4* 12.5* 12.1* 13.3* 15.8*  NEUTROABS 14.9*  --   --   --   --   HGB 11.9* 11.0* 9.4* 9.6* 9.8*  HCT 36.6* 33.8* 28.7* 29.7* 30.1*  MCV 76.9* 77.7* 75.9* 76.9* 76.4*  PLT 485* 377 379 359 416*   CBG:  Recent Labs Lab 05/11/13 2156 05/12/13 0012 05/12/13 0348 05/12/13 0859 05/12/13 1207  GLUCAP 125* 117* 112* 105* 140*     Signed:  Cathi Roan PA-S2, Physician Assistant Student  Algis Downs, PA-C [facility] 05/16/2013, 2:46 PM

## 2013-05-16 NOTE — Consult Note (Addendum)
Wound care follow-up: Refer to previous progress notes on 10/1 and 10/2 for detailed wound assessment, measurements, and plan of care. Requested to re-assess wounds.  No change in appearance from previous notes.  Sacrum stage 4 wound with exposed bone and beefy red wound bed, no odor, mod yellow drainage. If pt displays s/s of infection, then recommend X-ray to r/o osteomyelitis. Pt is frequently incontinent and difficult to keep wound from becoming soiled; stool in wound at this time.  Air mattress in place to reduce pressure.  Bilat heels and legs with eschar.  No odor or drainage.  Heels floated to reduce pressure.  Foam dressing to protect from further injury.  It is best practice to leave dry stable eschar intact if pt has decreased perfusion.  Continue present plan of care. Please re-consult if further assistance is needed.  Thank-you,  Cammie Mcgee MSN, RN, CWOCN, [place], CNS [phone]

## 2013-05-16 NOTE — Clinical Social Work Note (Signed)
CSW attempted to contact wife to notify her that RN has requested ambulance transport for patient and DC should take place in the next couple of hours. Wife did not answer phone, CSW was able to reach daughter (Mrs. Dimas Aguas) who will notify of patient's DC back to [facility]. RN will attempt to call wife again when ambulance transport picks up patient. Packet left on patient's chart. CSW signing off.

## 2013-05-16 NOTE — Discharge Summary (Signed)
Addendum  Patient seen and examined, chart and data base reviewed.  I agree with the above assessment and plan.  For full details please see Mrs. Algis Downs PA note.  RALSTONIA PAUCULA UTI, started on Ceftin on DC.    Clint Lipps, MD [facility] Pager: [phone] 05/16/2013, 2:58 PM

## 2013-05-17 ENCOUNTER — Encounter: Payer: Self-pay | Admitting: Internal Medicine

## 2013-05-17 ENCOUNTER — Non-Acute Institutional Stay (SKILLED_NURSING_FACILITY): Payer: Medicare Other | Admitting: Internal Medicine

## 2013-05-17 DIAGNOSIS — R82998 Other abnormal findings in urine: Secondary | ICD-10-CM

## 2013-05-17 DIAGNOSIS — L89109 Pressure ulcer of unspecified part of back, unspecified stage: Secondary | ICD-10-CM

## 2013-05-17 DIAGNOSIS — L89154 Pressure ulcer of sacral region, stage 4: Secondary | ICD-10-CM

## 2013-05-17 DIAGNOSIS — L8994 Pressure ulcer of unspecified site, stage 4: Secondary | ICD-10-CM

## 2013-05-17 DIAGNOSIS — D638 Anemia in other chronic diseases classified elsewhere: Secondary | ICD-10-CM

## 2013-05-17 DIAGNOSIS — A419 Sepsis, unspecified organism: Secondary | ICD-10-CM

## 2013-05-17 DIAGNOSIS — L89610 Pressure ulcer of right heel, unstageable: Secondary | ICD-10-CM

## 2013-05-17 DIAGNOSIS — J9600 Acute respiratory failure, unspecified whether with hypoxia or hypercapnia: Secondary | ICD-10-CM

## 2013-05-17 DIAGNOSIS — J9601 Acute respiratory failure with hypoxia: Secondary | ICD-10-CM

## 2013-05-17 DIAGNOSIS — L89609 Pressure ulcer of unspecified heel, unspecified stage: Secondary | ICD-10-CM

## 2013-05-17 DIAGNOSIS — R8290 Unspecified abnormal findings in urine: Secondary | ICD-10-CM

## 2013-05-17 DIAGNOSIS — E41 Nutritional marasmus: Secondary | ICD-10-CM

## 2013-05-17 DIAGNOSIS — N186 End stage renal disease: Secondary | ICD-10-CM

## 2013-05-17 DIAGNOSIS — E43 Unspecified severe protein-calorie malnutrition: Secondary | ICD-10-CM

## 2013-05-17 DIAGNOSIS — L8995 Pressure ulcer of unspecified site, unstageable: Secondary | ICD-10-CM

## 2013-05-17 LAB — CULTURE, BLOOD (ROUTINE X 2): Culture: NO GROWTH

## 2013-05-17 NOTE — Progress Notes (Signed)
Patient ID: [name], male   DOB: 09-13-1930, 79 y.o.   MRN: [id number] Provider:  Gwenith Spitz. Renato Gails, D.O., C.M.D. Location:  [facility]  PCP: Crawford Givens, MD  Code Status: full code   No Known Allergies  Chief Complaint  Patient presents with  . Hospitalization Follow-up    Readmission s/p hospitalization with sepsis    HPI: 79 y.o. male readmitted to SNF s/p hospitalization with sepsis thought to be from UTI (treated with broad spectrum abx with vanc and zosyn 5d), but now final culture shows only 30K gram neg rods.  Completes augmentin 7 day course tomorrow for possible infection/osteomyelitis of sacral decubitus wound (previously treated with IV vanc x 6 wks).  Volume status was low so repleted.  Significant weight gain at hospital from IVF noted when admission weight entered.   Met with pt's daughter and she is agreeable to palliative care.    ROS: Review of Systems  Constitutional: Positive for weight loss. Negative for fever and chills.  Respiratory: Negative for shortness of breath.   Cardiovascular: Negative for chest pain.  Gastrointestinal: Negative for abdominal pain.  Genitourinary: Negative for dysuria.  Musculoskeletal: Positive for back pain and joint pain.       Buttock pain  Skin:       Wound right heel, sacrum  Neurological: Positive for weakness.  Psychiatric/Behavioral: Positive for memory loss.     Past Medical History  Diagnosis Date  . Hypertension   . Diabetes mellitus     typeII / No meds  . Cataract 01/2002    Right  . Dialysis patient 10/13/2007  . Radiculopathy 05/14/2006    NCV study negative carpal tunnel, Left C5 radiculopathy  . Arthritis     Knee pain  . ESRD (end stage renal disease) on dialysis 2009    [facility] diaylsis Monday , Wed. and Friday per Dr. Kathrene Bongo  . Stroke   . Elevated PSA     h/o, pt had declined further eval.   . Syncope 11/02/2012  . Sepsis   . Dementia   . UTI (urinary  tract infection)    Past Surgical History  Procedure Laterality Date  . Varicose vein surgery  1978  . External fixation wrist fracture  1990  . Thyroidectomy, partial  08/03/2003    Right thyroid lobectomy, subtotal parathyr. sec hyperparthyroidismadenosis  . Av fistula placement  07/2004    Left arm AV fistula placement [name] via [place])  . Eye surgery      Cataract  . Wound debridement N/A 02/08/2013    Procedure: DEBRIDEMENT WOUND;  Surgeon: Wilmon Arms. Corliss Skains, MD;  Location: [facility] OR;  Service: General;  Laterality: N/A;   Social History:   reports that he quit smoking about 11 years ago. His smoking use included Cigarettes. He smoked 0.00 packs per day for 10 years. He has never used smokeless tobacco. He reports that he does not drink alcohol or use illicit drugs.  Family History  Problem Relation Age of Onset  . Cancer Father 82    Lung Cancer//? stomach cancer  . Cancer Sister 78    brain tumor    Medications: Patient's Medications  New Prescriptions   No medications on file  Previous Medications   ACETAMINOPHEN (TYLENOL) 650 MG CR TABLET    Take 1,300 mg by mouth every 4 (four) hours as needed for pain or fever (not to exceed 4000g daily).   AMOXICILLIN-CLAVULANATE (AUGMENTIN) 875-125 MG PER TABLET  Take 1 tablet by mouth 2 (two) times daily.   CEFUROXIME (CEFTIN) 500 MG TABLET    Take 1 tablet (500 mg total) by mouth 2 (two) times daily.   CINACALCET (SENSIPAR) 30 MG TABLET    Take 30 mg by mouth 2 (two) times daily.    DIPYRIDAMOLE-ASPIRIN (AGGRENOX) 200-25 MG PER 12 HR CAPSULE    Take 1 capsule by mouth 2 (two) times daily.   FEEDING SUPPLEMENT (PRO-STAT SUGAR FREE 64) LIQD    Take 30 mLs by mouth 3 (three) times daily with meals.   GABAPENTIN (NEURONTIN) 100 MG CAPSULE    Take 100 mg by mouth daily.   LANTHANUM (FOSRENOL) 500 MG CHEWABLE TABLET    Chew 1 tablet (500 mg total) by mouth 3 (three) times daily with meals.   MIRTAZAPINE (REMERON) 7.5 MG TABLET    Take 1  tablet (7.5 mg total) by mouth at bedtime.   MULTIVITAMIN (RENA-VIT) TABS TABLET    Take 1 tablet by mouth daily.     NUTRITIONAL SUPPLEMENTS (FEEDING SUPPLEMENT, NEPRO CARB STEADY,) LIQD    Take 237 mLs by mouth 2 (two) times daily between meals.   NUTRITIONAL SUPPLEMENTS (FEEDING SUPPLEMENT, NEPRO CARB STEADY,) LIQD    Take 237 mLs by mouth as needed (missed meal during dialysis.).   OMEGA-3 FATTY ACIDS (FISH OIL) 1000 MG CAPS    Take 1 capsule by mouth daily.     OXYCODONE (OXY IR/ROXICODONE) 5 MG IMMEDIATE RELEASE TABLET    Take 1 tablet (5 mg total) by mouth every 6 (six) hours as needed for pain.   SACCHAROMYCES BOULARDII (FLORASTOR) 250 MG CAPSULE    Take 250 mg by mouth 2 (two) times daily.  Modified Medications   No medications on file  Discontinued Medications   No medications on file     Physical Exam: Filed Vitals:   05/17/13 1212  BP: 121/78  Pulse: 77  Temp: 97.8 F (36.6 C)  Resp: 18  Height: 6\' 4"  (1.93 m)  Weight: 169 lb (76.658 kg)  Physical Exam  Constitutional:  Frail black male, nad  HENT:  Head: Normocephalic and atraumatic.  Right Ear: External ear normal.  Left Ear: External ear normal.  Nose: Nose normal.  Mouth/Throat: Oropharynx is clear and moist.  Eyes: Conjunctivae and EOM are normal. Pupils are equal, round, and reactive to light.  Neck: Normal range of motion.  Cardiovascular: Normal rate, regular rhythm and normal heart sounds.   Pulmonary/Chest: Effort normal and breath sounds normal. No respiratory distress.  Abdominal: Soft. Bowel sounds are normal. He exhibits no distension. There is no tenderness.  Neurological: He is alert.  But confused  Skin: Skin is warm.  Large sacral decubitus ulcer, right heel     Labs reviewed: Basic Metabolic Panel:  Recent Labs  47/82/95 0118 01/29/13 0130  05/11/13 1547  05/13/13 0725 05/15/13 0445 05/16/13 0600 05/16/13 1125  NA 145  --   < > 133*  < > 136 134* 132* 132*  K 2.8*  --   < > 5.7*   < > 4.1 3.1* 4.2 4.2  CL 112  --   < > 94*  < > 98 95* 96 95*  CO2  --   --   < > 25  < > 26 28 24 22   GLUCOSE 72  --   < > 126*  < > 78 93 82 139*  BUN 23  --   < > 68*  < > 45* 41*  52* 56*  CREATININE 2.70*  --   < > 5.83*  < > 4.69* 4.01* 4.99* 5.12*  CALCIUM  --   --   < > 9.9  < > 9.7 9.3 9.4 9.4  MG  --  1.4*  --   --   --   --   --   --   --   PHOS  --   --   < > 6.3*  --  6.9*  --   --  3.6  < > = values in this interval not displayed. Liver Function Tests:  Recent Labs  01/29/13 0015 01/29/13 0848  05/11/13 1150 05/11/13 1547 05/13/13 0725 05/16/13 1125  AST 31 45*  --  30  --   --   --   ALT 25 35  --  19  --   --   --   ALKPHOS 43 71  --  83  --   --   --   BILITOT 0.2* 0.4  --  0.4  --   --   --   PROT 5.1* 7.4  --  9.6*  --   --   --   ALBUMIN 1.4* 2.0*  < > 2.5* 2.2* 1.9* 1.7*  < > = values in this interval not displayed.  Recent Labs  01/29/13 0015  LIPASE 41  CBC:  Recent Labs  01/29/13 0015  01/29/13 0848  05/11/13 1150  05/13/13 0726 05/15/13 0445 05/16/13 1125  WBC 18.7*  --  25.7*  < > 16.4*  < > 12.1* 13.3* 15.8*  NEUTROABS 16.8*  --  21.2*  --  14.9*  --   --   --   --   HGB 6.4*  < > 10.1*  < > 11.9*  < > 9.4* 9.6* 9.8*  HCT 19.2*  < > 31.4*  < > 36.6*  < > 28.7* 29.7* 30.1*  MCV 83.1  --  84.4  < > 76.9*  < > 75.9* 76.9* 76.4*  PLT 382  --  460*  < > 485*  < > 379 359 416*  < > = values in this interval not displayed. Cardiac Enzymes:  Recent Labs  11/03/12 2309 11/04/12 0457 12/27/12 2305  TROPONINI <0.30 <0.30 <0.30  CBG:  Recent Labs  05/12/13 0348 05/12/13 0859 05/12/13 1207  GLUCAP 112* 105* 140*    Imaging and Procedures: 05/11/13:  CXR:  Minimal cardiac enlargement.  Mild right basilar atelectasis.   05/13/13:  PCXR:  Cardiac enlargement with bibasilar atelectasis, unchanged from prior study  Assessment/Plan 1. Decubitus ulcer of sacral region, stage 4 Wound care sacral and leg wounds via treatment nurse and wound  care specialist Suspect this will cause recurrent episodes of sepsis from osteomyelitis--with his poor nutritional state, he is unlikely to heal--I have consulted palliative care due to the osteomyelitis and protein calorie malnutrition with dementia  2. Sepsis Completing antibiotics for this today (augmentin for the osteomyelitis sacrum), but doubt this will be his last episode of sepsis CBC, bmp 10/10  3. Anemia of chronic disease With end stage renal disease Cont to monitor  4. End stage renal disease Continues with HD per nephrology Cont treatment for secondary hyperparathyroidism due to ESRD  5. Severe malnutrition Continue protein supplements Dialysis diet with chopped meats  6. Acute respiratory failure with hypoxia -improved  7. Decubitus ulcer of heel, unstageable, right Wound care sacral and leg wounds Use of prevalon boots for pressure offloading  8. Abnormal urinalysis  F/u urine culture result today:  only 30 K gram neg rods--inadequate for UTI  Functional status: dependent in adls  Family/ staff Communication: discussed with daughter, unit supervisor also present for visit  Labs/tests ordered: cbc, bmp 05/20/13;  Consults [facility] palliative care--may qualify for hospice dependent upon payor

## 2013-06-10 ENCOUNTER — Other Ambulatory Visit: Payer: Self-pay

## 2013-06-10 MED ORDER — MORPHINE SULFATE (CONCENTRATE) 20 MG/ML PO SOLN: ORAL | Status: AC

## 2013-06-11 DEATH — deceased

## 2013-06-16 ENCOUNTER — Telehealth: Payer: Self-pay | Admitting: Family Medicine

## 2013-06-16 NOTE — Telephone Encounter (Signed)
Called pt's son x2, LMOVM with condolences re: his father.

## 2013-09-30 ENCOUNTER — Encounter: Payer: Self-pay | Admitting: Internal Medicine

## 2014-05-02 IMAGING — CR DG KNEE COMPLETE 4+V*L*
5 series · 5 of 5 positions shown · non-contrast
Comparison: None

CLINICAL DATA: Left knee pain.  No known injury.

LEFT KNEE - COMPLETE 4+ VIEW

[x knee obl left (1 of 2)]
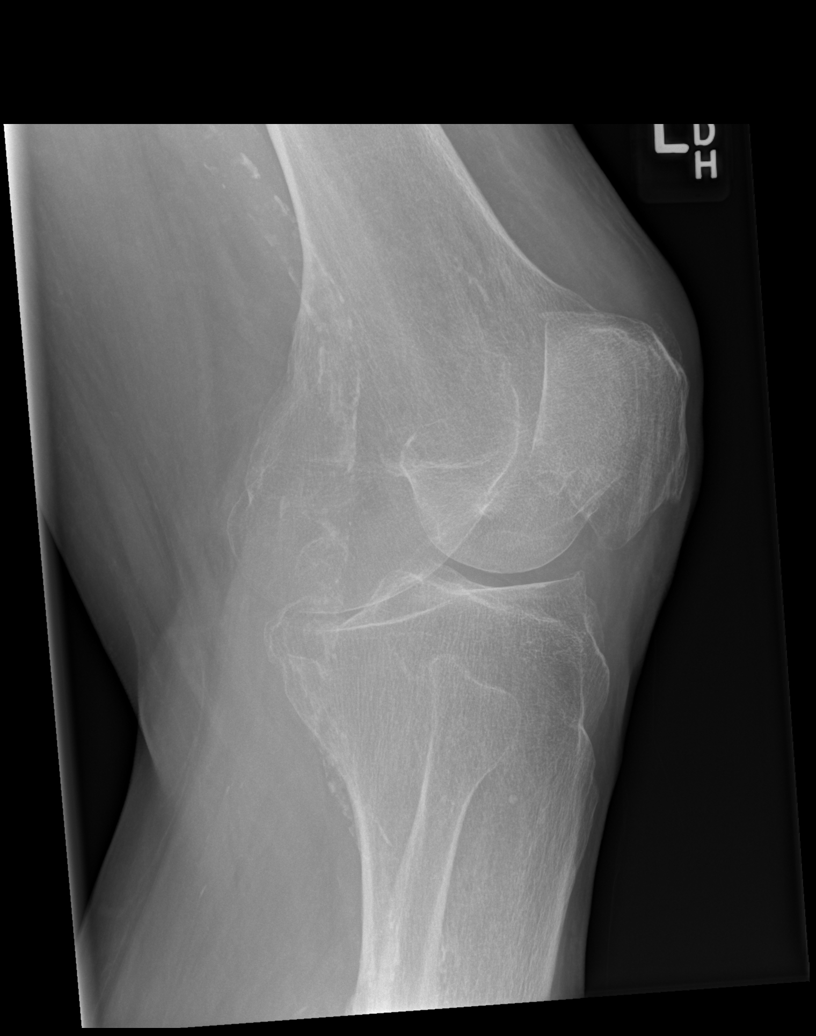

[x knee lat left (1 of 2)]
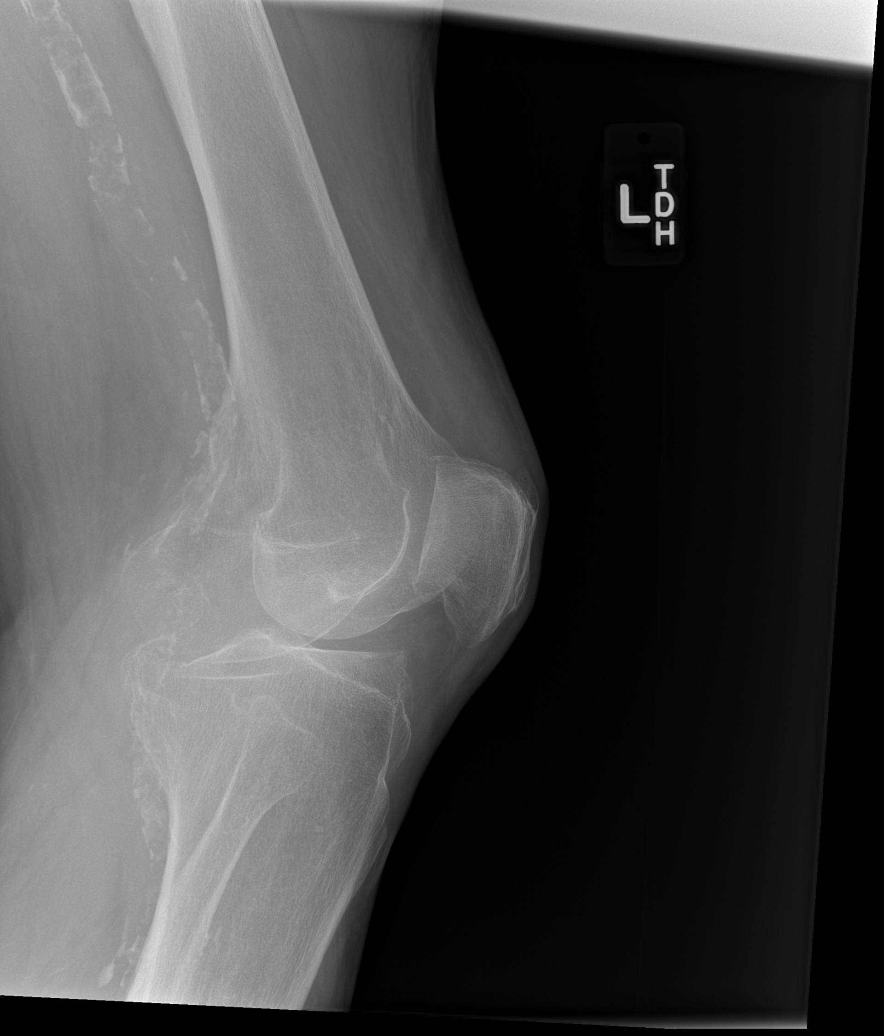

[x knee ap left]
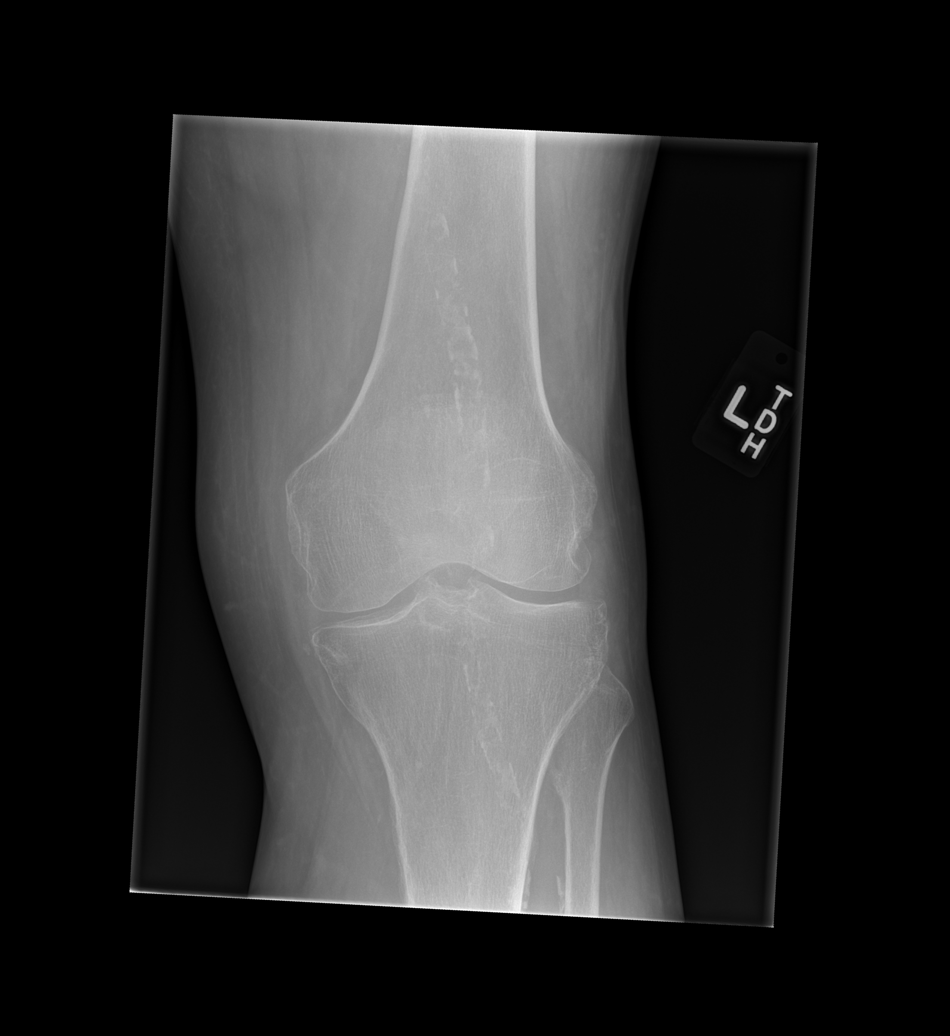

[x knee obl left (2 of 2)]
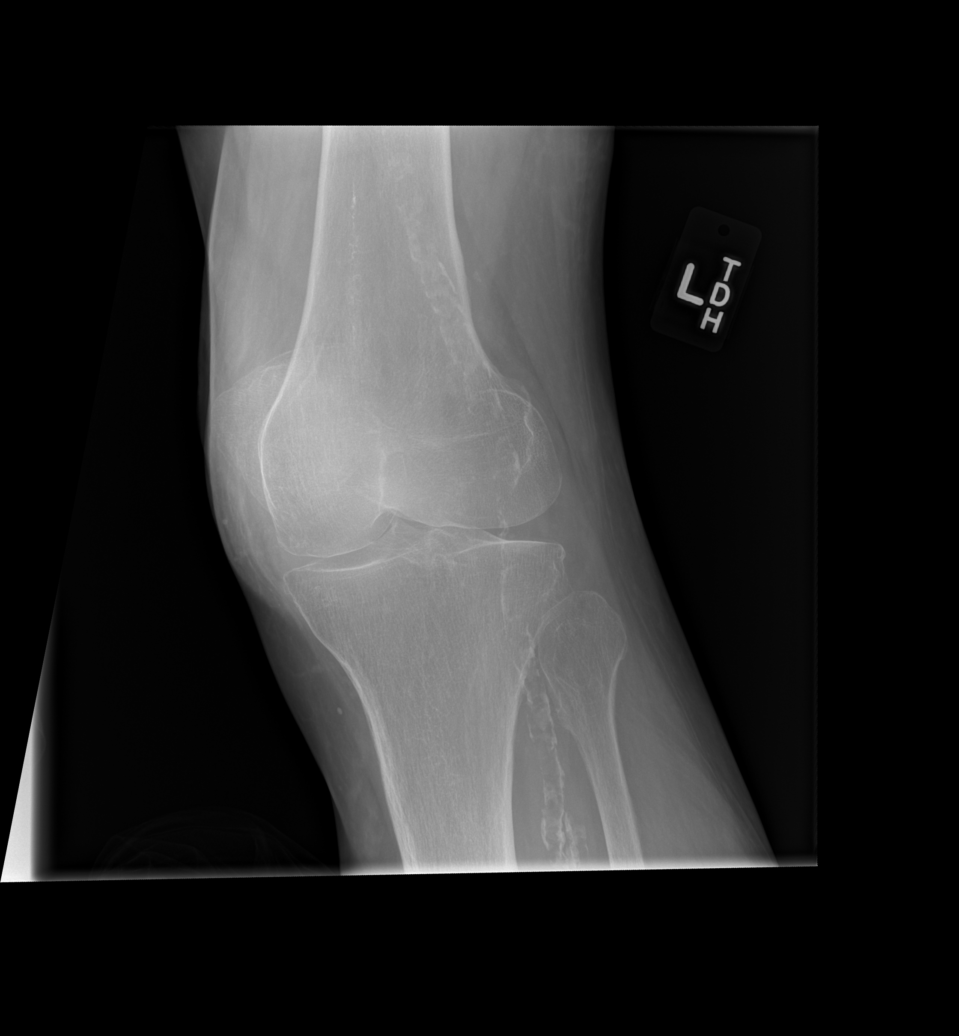

[x knee lat left (2 of 2)]
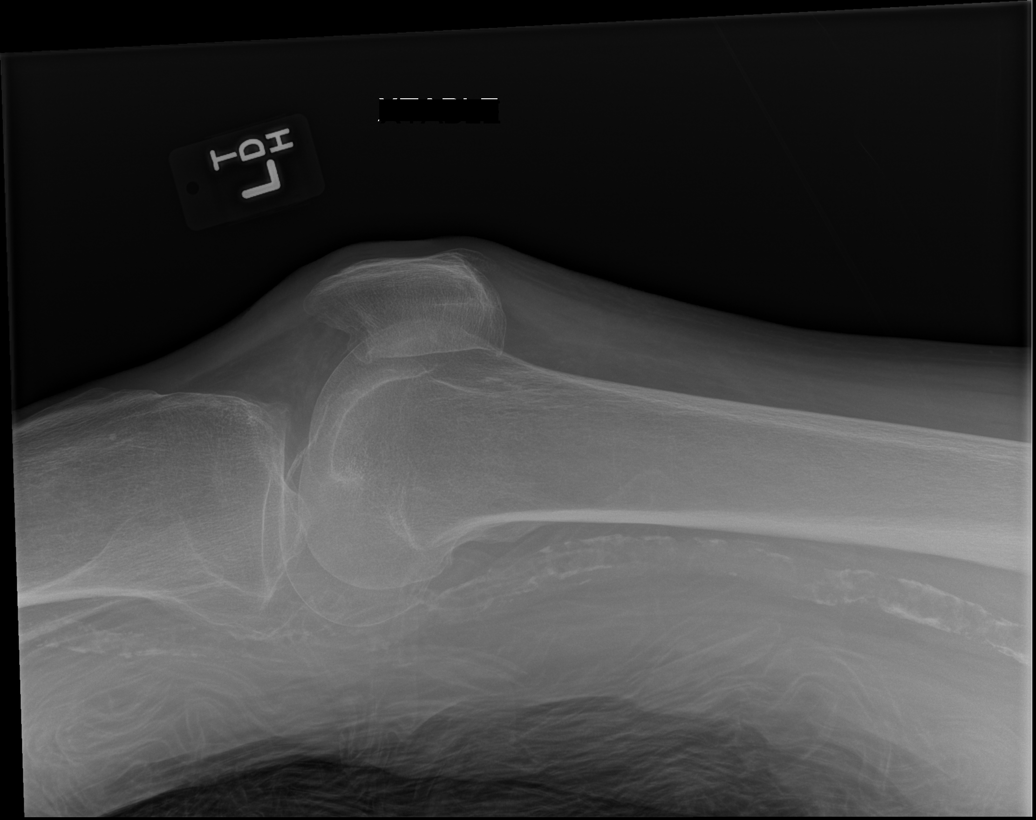

[5 of 5 positions shown; findings below may reference images not displayed]

FINDINGS: Diffuse osteopenia is noted.
Mild to moderate tricompartmental degenerative changes are
identified.
There is no evidence of acute fracture, subluxation or dislocation.
There is no evidence of knee effusion.
No focal bony lesions are identified.
Heavy vascular calcifications are present.
IMPRESSION: No evidence of acute abnormality.

Mild to moderate tricompartmental degenerative changes.

## 2014-08-01 IMAGING — CR DG CHEST 1V PORT
1 series · 1 of 1 positions shown · non-contrast
Comparison: Portable exam 6633 hours compared to 12/27/2012

CLINICAL DATA: Sepsis, hypotension, unresponsive, history
hypertension, diabetes, end-stage renal disease on dialysis

PORTABLE CHEST - 1 VIEW

[AP]
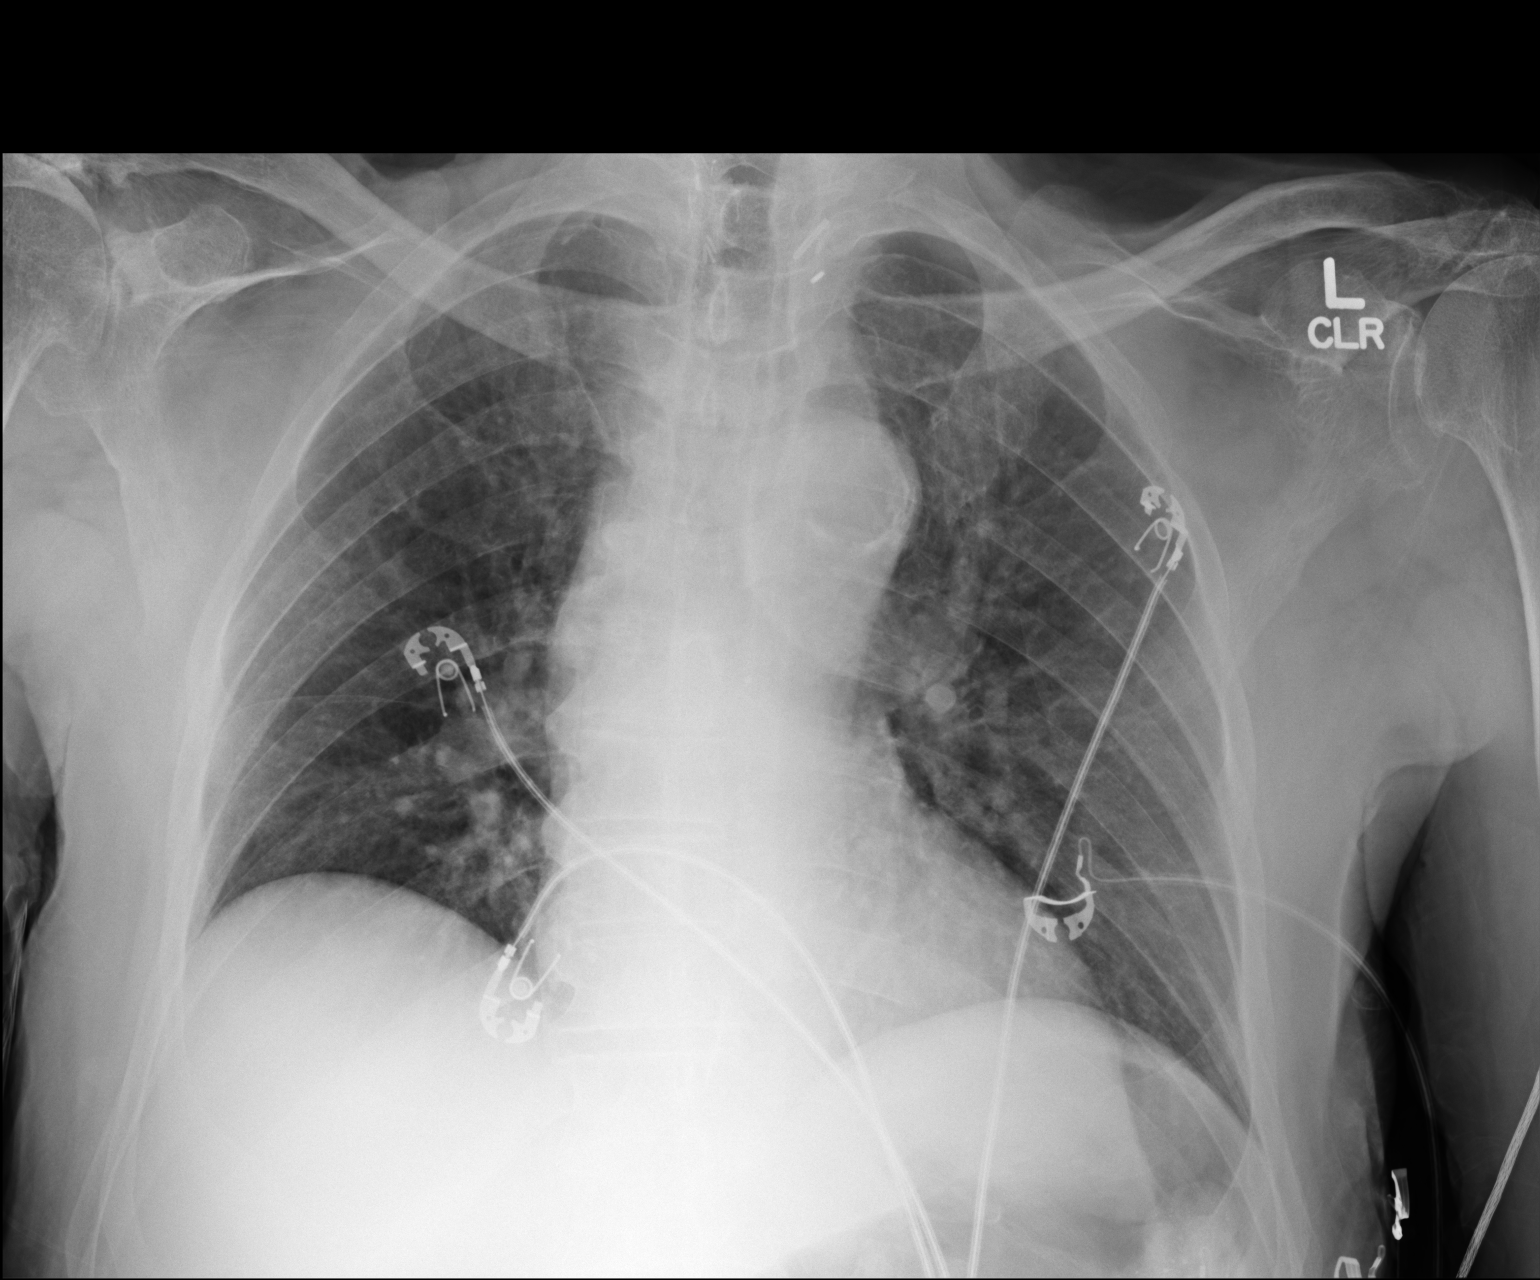

[1 of 1 positions shown; findings below may reference images not displayed]

FINDINGS: Upper normal heart size.
Atherosclerotic calcification aorta.
Stable mediastinal contours and pulmonary vascularity.
Bronchitic changes with minimal right basilar atelectasis.
No definite acute infiltrate, pleural effusion or pneumothorax.
Bilateral chronic rotator cuff tears and AC joint degenerative
changes.
Scattered endplate spur formation thoracic spine.
IMPRESSION: Bronchitic changes with minimal right basilar atelectasis.

## 2015-01-03 NOTE — Progress Notes (Signed)
This encounter was created in error - please disregard.
